# Patient Record
Sex: Male | Born: 1935
Health system: Southern US, Community
[De-identification: ages and names within clinical notes are randomized; demographics above are authoritative.]

## PROBLEM LIST (undated history)

## (undated) DIAGNOSIS — E785 Hyperlipidemia, unspecified: Secondary | ICD-10-CM

## (undated) DIAGNOSIS — S02109A Fracture of base of skull, unspecified side, initial encounter for closed fracture: Secondary | ICD-10-CM

## (undated) DIAGNOSIS — I10 Essential (primary) hypertension: Secondary | ICD-10-CM

## (undated) DIAGNOSIS — Z8546 Personal history of malignant neoplasm of prostate: Secondary | ICD-10-CM

## (undated) DIAGNOSIS — N189 Chronic kidney disease, unspecified: Secondary | ICD-10-CM

## (undated) DIAGNOSIS — S06309A Unspecified focal traumatic brain injury with loss of consciousness of unspecified duration, initial encounter: Secondary | ICD-10-CM

## (undated) DIAGNOSIS — C801 Malignant (primary) neoplasm, unspecified: Secondary | ICD-10-CM

## (undated) DIAGNOSIS — E8881 Metabolic syndrome: Secondary | ICD-10-CM

## (undated) DIAGNOSIS — I2510 Atherosclerotic heart disease of native coronary artery without angina pectoris: Secondary | ICD-10-CM

## (undated) DIAGNOSIS — I739 Peripheral vascular disease, unspecified: Secondary | ICD-10-CM

## (undated) DIAGNOSIS — I5030 Unspecified diastolic (congestive) heart failure: Secondary | ICD-10-CM

## (undated) HISTORY — DX: Metabolic syndrome: E88.810

## (undated) HISTORY — DX: Personal history of malignant neoplasm of prostate: Z85.46

## (undated) HISTORY — DX: Unspecified focal traumatic brain injury with loss of consciousness of unspecified duration, initial encounter: S06.309A

## (undated) HISTORY — DX: Metabolic syndrome: E88.81

## (undated) HISTORY — DX: Fracture of base of skull, unspecified side, initial encounter for closed fracture: S02.109A

## (undated) HISTORY — DX: Chronic kidney disease, unspecified: N18.9

## (undated) HISTORY — DX: Atherosclerotic heart disease of native coronary artery without angina pectoris: I25.10

## (undated) HISTORY — PX: TRANSPERINEAL IMPLANT OF RADIATION SEEDS W/ ULTRASOUND: SUR1381

## (undated) HISTORY — DX: Unspecified diastolic (congestive) heart failure: I50.30

## (undated) HISTORY — DX: Hyperlipidemia, unspecified: E78.5

## (undated) HISTORY — DX: Peripheral vascular disease, unspecified: I73.9

## (undated) HISTORY — PX: BRAIN SURGERY: SHX531

---

## 1998-01-20 ENCOUNTER — Ambulatory Visit (HOSPITAL_COMMUNITY): Admission: RE | Admit: 1998-01-20 | Discharge: 1998-01-20 | Payer: Self-pay | Admitting: Family Medicine

## 1998-07-08 ENCOUNTER — Emergency Department (HOSPITAL_COMMUNITY): Admission: EM | Admit: 1998-07-08 | Discharge: 1998-07-08 | Payer: Self-pay | Admitting: *Deleted

## 1999-09-17 ENCOUNTER — Other Ambulatory Visit: Admission: RE | Admit: 1999-09-17 | Discharge: 1999-09-17 | Payer: Self-pay | Admitting: Urology

## 1999-09-17 ENCOUNTER — Encounter (INDEPENDENT_AMBULATORY_CARE_PROVIDER_SITE_OTHER): Payer: Self-pay | Admitting: Specialist

## 1999-10-22 ENCOUNTER — Encounter: Admission: RE | Admit: 1999-10-22 | Discharge: 2000-01-20 | Payer: Self-pay | Admitting: Urology

## 1999-12-28 ENCOUNTER — Encounter: Admission: RE | Admit: 1999-12-28 | Discharge: 1999-12-28 | Payer: Self-pay | Admitting: Family Medicine

## 1999-12-28 ENCOUNTER — Encounter: Payer: Self-pay | Admitting: Family Medicine

## 2000-01-11 ENCOUNTER — Encounter: Payer: Self-pay | Admitting: Urology

## 2000-01-11 ENCOUNTER — Ambulatory Visit (HOSPITAL_BASED_OUTPATIENT_CLINIC_OR_DEPARTMENT_OTHER): Admission: RE | Admit: 2000-01-11 | Discharge: 2000-01-11 | Payer: Self-pay | Admitting: Urology

## 2000-02-04 ENCOUNTER — Encounter: Admission: RE | Admit: 2000-02-04 | Discharge: 2000-05-04 | Payer: Self-pay | Admitting: Radiation Oncology

## 2000-10-06 ENCOUNTER — Encounter: Payer: Self-pay | Admitting: Family Medicine

## 2000-10-06 ENCOUNTER — Encounter: Admission: RE | Admit: 2000-10-06 | Discharge: 2000-10-06 | Payer: Self-pay | Admitting: Family Medicine

## 2002-02-16 ENCOUNTER — Encounter: Admission: RE | Admit: 2002-02-16 | Discharge: 2002-05-17 | Payer: Self-pay

## 2002-11-02 ENCOUNTER — Encounter: Admission: RE | Admit: 2002-11-02 | Discharge: 2002-12-08 | Payer: Self-pay

## 2003-07-05 ENCOUNTER — Encounter: Admission: RE | Admit: 2003-07-05 | Discharge: 2003-07-05 | Payer: Self-pay | Admitting: Family Medicine

## 2004-03-01 ENCOUNTER — Inpatient Hospital Stay (HOSPITAL_COMMUNITY): Admission: EM | Admit: 2004-03-01 | Discharge: 2004-03-03 | Payer: Self-pay | Admitting: Emergency Medicine

## 2004-03-01 ENCOUNTER — Ambulatory Visit: Payer: Self-pay | Admitting: Cardiology

## 2004-03-07 ENCOUNTER — Ambulatory Visit (HOSPITAL_COMMUNITY): Admission: RE | Admit: 2004-03-07 | Discharge: 2004-03-08 | Payer: Self-pay | Admitting: Cardiology

## 2004-03-07 ENCOUNTER — Ambulatory Visit: Payer: Self-pay | Admitting: Cardiology

## 2004-03-15 ENCOUNTER — Ambulatory Visit: Payer: Self-pay | Admitting: Internal Medicine

## 2004-03-19 ENCOUNTER — Ambulatory Visit: Payer: Self-pay | Admitting: Internal Medicine

## 2004-03-22 ENCOUNTER — Ambulatory Visit: Payer: Self-pay | Admitting: Internal Medicine

## 2004-08-16 ENCOUNTER — Ambulatory Visit: Payer: Self-pay | Admitting: Internal Medicine

## 2004-10-11 ENCOUNTER — Ambulatory Visit: Payer: Self-pay

## 2005-02-26 ENCOUNTER — Ambulatory Visit (HOSPITAL_COMMUNITY): Admission: RE | Admit: 2005-02-26 | Discharge: 2005-02-26 | Payer: Self-pay | Admitting: Otolaryngology

## 2005-02-27 ENCOUNTER — Inpatient Hospital Stay (HOSPITAL_COMMUNITY): Admission: RE | Admit: 2005-02-27 | Discharge: 2005-03-01 | Payer: Self-pay | Admitting: Otolaryngology

## 2005-02-27 ENCOUNTER — Ambulatory Visit: Payer: Self-pay | Admitting: Cardiovascular Disease

## 2005-03-04 ENCOUNTER — Ambulatory Visit: Payer: Self-pay | Admitting: Oncology

## 2005-03-22 ENCOUNTER — Ambulatory Visit: Payer: Self-pay | Admitting: Internal Medicine

## 2005-03-23 ENCOUNTER — Emergency Department (HOSPITAL_COMMUNITY): Admission: EM | Admit: 2005-03-23 | Discharge: 2005-03-23 | Payer: Self-pay | Admitting: Emergency Medicine

## 2005-09-10 ENCOUNTER — Ambulatory Visit: Payer: Self-pay | Admitting: Internal Medicine

## 2005-09-12 ENCOUNTER — Ambulatory Visit: Payer: Self-pay | Admitting: Internal Medicine

## 2006-01-22 ENCOUNTER — Ambulatory Visit: Payer: Self-pay | Admitting: Cardiology

## 2006-01-22 LAB — CONVERTED CEMR LAB
ALT: 16 units/L (ref 0–40)
AST: 18 units/L (ref 0–37)
BUN: 18 mg/dL (ref 6–23)
Calcium: 9.7 mg/dL (ref 8.4–10.5)
Chloride: 109 meq/L (ref 96–112)
Creatinine, Ser: 1.5 mg/dL (ref 0.4–1.5)
GFR calc Af Amer: 59 mL/min
GFR calc non Af Amer: 49 mL/min
HCT: 40.4 % (ref 39.0–52.0)
Hemoglobin: 13.3 g/dL (ref 13.0–17.0)
MCHC: 32.9 g/dL (ref 30.0–36.0)
MCV: 91.4 fL (ref 78.0–100.0)
Potassium: 4.2 meq/L (ref 3.5–5.1)
RDW: 13.9 % (ref 11.5–14.6)
WBC: 7.1 10*3/uL (ref 4.5–10.5)

## 2006-02-03 ENCOUNTER — Ambulatory Visit: Payer: Self-pay

## 2006-02-12 ENCOUNTER — Ambulatory Visit: Payer: Self-pay | Admitting: Internal Medicine

## 2006-02-18 ENCOUNTER — Ambulatory Visit: Payer: Self-pay | Admitting: Internal Medicine

## 2006-02-18 LAB — CONVERTED CEMR LAB
AST: 16 units/L (ref 0–37)
Albumin: 3.6 g/dL (ref 3.5–5.2)
CO2: 27 meq/L (ref 19–32)
Chloride: 108 meq/L (ref 96–112)
Cholesterol: 126 mg/dL (ref 0–200)
Creatinine, Ser: 1.4 mg/dL (ref 0.4–1.5)
HDL: 30.9 mg/dL — ABNORMAL LOW (ref >39.0)
Potassium: 4.6 meq/L (ref 3.5–5.1)
Sodium: 145 meq/L (ref 135–145)
Total Bilirubin: 0.7 mg/dL (ref 0.3–1.2)
Total Protein: 6.6 g/dL (ref 6.0–8.3)
Triglycerides: 136 mg/dL (ref 0–149)
VLDL: 27 mg/dL (ref 0–40)

## 2006-02-28 ENCOUNTER — Ambulatory Visit: Payer: Self-pay | Admitting: Cardiovascular Disease

## 2006-04-30 ENCOUNTER — Ambulatory Visit: Payer: Self-pay

## 2006-04-30 ENCOUNTER — Ambulatory Visit: Payer: Self-pay | Admitting: Cardiovascular Disease

## 2006-04-30 LAB — CONVERTED CEMR LAB
BUN: 24 mg/dL — ABNORMAL HIGH (ref 6–23)
Basophils Absolute: 0.1 10*3/uL (ref 0.0–0.1)
Calcium: 9.6 mg/dL (ref 8.4–10.5)
GFR calc Af Amer: 59 mL/min
GFR calc non Af Amer: 49 mL/min
Hemoglobin: 11.1 g/dL — ABNORMAL LOW (ref 13.0–17.0)
INR: 1 (ref 0.9–2.0)
Lymphocytes Relative: 24.2 % (ref 12.0–46.0)
MCHC: 33.7 g/dL (ref 30.0–36.0)
Monocytes Absolute: 0.4 10*3/uL (ref 0.2–0.7)
Monocytes Relative: 5.5 % (ref 3.0–11.0)
Neutro Abs: 4.6 10*3/uL (ref 1.4–7.7)
Platelets: 72 10*3/uL — ABNORMAL LOW (ref 150–400)
Potassium: 4.8 meq/L (ref 3.5–5.1)
Prothrombin Time: 11.8 s (ref 10.0–14.0)
RDW: 14.5 % (ref 11.5–14.6)

## 2006-05-09 ENCOUNTER — Ambulatory Visit: Payer: Self-pay | Admitting: Cardiovascular Disease

## 2006-05-09 ENCOUNTER — Ambulatory Visit (HOSPITAL_COMMUNITY): Admission: RE | Admit: 2006-05-09 | Discharge: 2006-05-09 | Payer: Self-pay | Admitting: Cardiovascular Disease

## 2006-05-21 ENCOUNTER — Ambulatory Visit: Payer: Self-pay | Admitting: Vascular Surgery

## 2006-05-26 ENCOUNTER — Ambulatory Visit: Payer: Self-pay | Admitting: Cardiovascular Disease

## 2006-08-26 ENCOUNTER — Ambulatory Visit: Payer: Self-pay | Admitting: Vascular Surgery

## 2006-12-01 ENCOUNTER — Ambulatory Visit: Payer: Self-pay | Admitting: Cardiovascular Disease

## 2007-03-03 ENCOUNTER — Ambulatory Visit: Payer: Self-pay | Admitting: Vascular Surgery

## 2007-03-18 ENCOUNTER — Ambulatory Visit: Payer: Self-pay | Admitting: Gastroenterology

## 2007-05-27 ENCOUNTER — Ambulatory Visit: Payer: Self-pay | Admitting: Cardiovascular Disease

## 2007-05-27 LAB — CONVERTED CEMR LAB
CO2: 27 meq/L (ref 19–32)
Chloride: 111 meq/L (ref 96–112)
GFR calc Af Amer: 59 mL/min
Glucose, Bld: 231 mg/dL — ABNORMAL HIGH (ref 70–99)
INR: 1.1 — ABNORMAL HIGH (ref 0.8–1.0)
Lymphocytes Relative: 26.4 % (ref 12.0–46.0)
Monocytes Absolute: 0.6 10*3/uL (ref 0.1–1.0)
Monocytes Relative: 8 % (ref 3.0–12.0)
Neutrophils Relative %: 63.6 % (ref 43.0–77.0)
Platelets: 84 10*3/uL — ABNORMAL LOW (ref 150–400)
Potassium: 4.7 meq/L (ref 3.5–5.1)
Prothrombin Time: 13.1 s (ref 10.9–13.3)
RDW: 14.6 % (ref 11.5–14.6)
Sodium: 143 meq/L (ref 135–145)
WBC: 7.5 10*3/uL (ref 4.5–10.5)

## 2007-05-28 ENCOUNTER — Ambulatory Visit: Payer: Self-pay | Admitting: Cardiovascular Disease

## 2007-05-28 ENCOUNTER — Inpatient Hospital Stay (HOSPITAL_BASED_OUTPATIENT_CLINIC_OR_DEPARTMENT_OTHER): Admission: RE | Admit: 2007-05-28 | Discharge: 2007-05-28 | Payer: Self-pay | Admitting: Cardiovascular Disease

## 2007-08-25 ENCOUNTER — Ambulatory Visit: Payer: Self-pay | Admitting: Vascular Surgery

## 2007-09-11 ENCOUNTER — Ambulatory Visit: Payer: Self-pay | Admitting: Cardiovascular Disease

## 2007-09-16 ENCOUNTER — Ambulatory Visit: Payer: Self-pay | Admitting: Cardiovascular Disease

## 2007-09-16 LAB — CONVERTED CEMR LAB
AST: 14 units/L (ref 0–37)
Albumin: 3.8 g/dL (ref 3.5–5.2)
HDL: 33 mg/dL — ABNORMAL LOW (ref >39.0)
Total CHOL/HDL Ratio: 3.8
Triglycerides: 167 mg/dL — ABNORMAL HIGH (ref 0–149)
VLDL: 33 mg/dL (ref 0–40)

## 2007-09-24 ENCOUNTER — Ambulatory Visit (HOSPITAL_COMMUNITY): Admission: RE | Admit: 2007-09-24 | Discharge: 2007-09-24 | Payer: Self-pay | Admitting: Urology

## 2007-10-19 ENCOUNTER — Ambulatory Visit (HOSPITAL_BASED_OUTPATIENT_CLINIC_OR_DEPARTMENT_OTHER): Admission: RE | Admit: 2007-10-19 | Discharge: 2007-10-19 | Payer: Self-pay | Admitting: Urology

## 2008-01-08 DIAGNOSIS — S02109A Fracture of base of skull, unspecified side, initial encounter for closed fracture: Secondary | ICD-10-CM

## 2008-01-08 HISTORY — DX: Fracture of base of skull, unspecified side, initial encounter for closed fracture: S02.109A

## 2008-03-13 DIAGNOSIS — I2510 Atherosclerotic heart disease of native coronary artery without angina pectoris: Secondary | ICD-10-CM | POA: Insufficient documentation

## 2008-03-13 DIAGNOSIS — I70219 Atherosclerosis of native arteries of extremities with intermittent claudication, unspecified extremity: Secondary | ICD-10-CM

## 2008-03-13 DIAGNOSIS — I129 Hypertensive chronic kidney disease with stage 1 through stage 4 chronic kidney disease, or unspecified chronic kidney disease: Secondary | ICD-10-CM

## 2008-03-13 DIAGNOSIS — E785 Hyperlipidemia, unspecified: Secondary | ICD-10-CM

## 2008-03-14 ENCOUNTER — Encounter: Payer: Self-pay | Admitting: Cardiovascular Disease

## 2008-03-14 ENCOUNTER — Ambulatory Visit: Payer: Self-pay | Admitting: Cardiovascular Disease

## 2008-03-23 ENCOUNTER — Encounter (INDEPENDENT_AMBULATORY_CARE_PROVIDER_SITE_OTHER): Payer: Self-pay | Admitting: *Deleted

## 2008-03-23 ENCOUNTER — Encounter: Payer: Self-pay | Admitting: Internal Medicine

## 2008-03-23 ENCOUNTER — Ambulatory Visit: Payer: Self-pay | Admitting: Internal Medicine

## 2008-03-23 DIAGNOSIS — E8881 Metabolic syndrome: Secondary | ICD-10-CM

## 2008-03-23 DIAGNOSIS — E1151 Type 2 diabetes mellitus with diabetic peripheral angiopathy without gangrene: Secondary | ICD-10-CM

## 2008-03-23 DIAGNOSIS — M109 Gout, unspecified: Secondary | ICD-10-CM

## 2008-03-23 DIAGNOSIS — Z8546 Personal history of malignant neoplasm of prostate: Secondary | ICD-10-CM

## 2008-03-23 DIAGNOSIS — E1165 Type 2 diabetes mellitus with hyperglycemia: Secondary | ICD-10-CM

## 2008-03-23 LAB — CONVERTED CEMR LAB
AST: 27 units/L (ref 0–37)
Alkaline Phosphatase: 69 units/L (ref 39–117)
Basophils Relative: 0.4 % (ref 0.0–3.0)
CO2: 22 meq/L (ref 19–32)
Chloride: 115 meq/L — ABNORMAL HIGH (ref 96–112)
Cholesterol, target level: 200 mg/dL
Cholesterol: 152 mg/dL (ref 0–200)
Creatinine, Ser: 1.8 mg/dL — ABNORMAL HIGH (ref 0.4–1.5)
Direct LDL: 81.9 mg/dL
Hemoglobin: 12.8 g/dL — ABNORMAL LOW (ref 13.0–17.0)
Iron: 92 ug/dL (ref 42–165)
Ketones, ur: NEGATIVE mg/dL
Leukocytes, UA: NEGATIVE
MCHC: 33.6 g/dL (ref 30.0–36.0)
MCV: 92.3 fL (ref 78.0–100.0)
Magnesium: 2 mg/dL (ref 1.5–2.5)
Microalb Creat Ratio: 12.4 mg/g (ref 0.0–30.0)
RBC: 4.14 M/uL — ABNORMAL LOW (ref 4.22–5.81)
Sodium: 143 meq/L (ref 135–145)
Specific Gravity, Urine: 1.02 (ref 1.000–1.030)
Total Bilirubin: 1.3 mg/dL — ABNORMAL HIGH (ref 0.3–1.2)
Total CHOL/HDL Ratio: 5
Transferrin: 192.1 mg/dL — ABNORMAL LOW (ref 212.0–360.0)
Uric Acid, Serum: 4.4 mg/dL (ref 4.0–7.8)
Urine Glucose: NEGATIVE mg/dL
pH: 5.5 (ref 5.0–8.0)

## 2008-03-25 ENCOUNTER — Ambulatory Visit: Payer: Self-pay | Admitting: Cardiology

## 2008-03-25 ENCOUNTER — Inpatient Hospital Stay (HOSPITAL_COMMUNITY): Admission: EM | Admit: 2008-03-25 | Discharge: 2008-04-01 | Payer: Self-pay | Admitting: Emergency Medicine

## 2008-03-29 ENCOUNTER — Ambulatory Visit: Payer: Self-pay | Admitting: Physical Medicine & Rehabilitation

## 2008-03-31 ENCOUNTER — Encounter: Payer: Self-pay | Admitting: Cardiovascular Disease

## 2008-04-01 ENCOUNTER — Inpatient Hospital Stay (HOSPITAL_COMMUNITY)
Admission: RE | Admit: 2008-04-01 | Discharge: 2008-04-08 | Payer: Self-pay | Admitting: Physical Medicine & Rehabilitation

## 2008-04-08 ENCOUNTER — Inpatient Hospital Stay (HOSPITAL_COMMUNITY): Admission: EM | Admit: 2008-04-08 | Discharge: 2008-04-13 | Payer: Self-pay | Admitting: Neurological Surgery

## 2008-04-13 ENCOUNTER — Inpatient Hospital Stay (HOSPITAL_COMMUNITY)
Admission: RE | Admit: 2008-04-13 | Discharge: 2008-04-22 | Payer: Self-pay | Admitting: Physical Medicine & Rehabilitation

## 2008-04-16 ENCOUNTER — Ambulatory Visit: Payer: Self-pay | Admitting: Physical Medicine & Rehabilitation

## 2008-04-27 ENCOUNTER — Emergency Department (HOSPITAL_COMMUNITY): Admission: EM | Admit: 2008-04-27 | Discharge: 2008-04-27 | Payer: Self-pay | Admitting: Emergency Medicine

## 2008-04-29 ENCOUNTER — Ambulatory Visit: Payer: Self-pay | Admitting: Internal Medicine

## 2008-04-29 DIAGNOSIS — D518 Other vitamin B12 deficiency anemias: Secondary | ICD-10-CM

## 2008-04-29 LAB — CONVERTED CEMR LAB
Bilirubin Urine: NEGATIVE
Nitrite: NEGATIVE
Urobilinogen, UA: 0.2 (ref 0.0–1.0)
pH: 5.5 (ref 5.0–8.0)

## 2008-04-30 ENCOUNTER — Encounter: Payer: Self-pay | Admitting: Internal Medicine

## 2008-05-13 ENCOUNTER — Ambulatory Visit: Payer: Self-pay | Admitting: Internal Medicine

## 2008-05-23 ENCOUNTER — Encounter
Admission: RE | Admit: 2008-05-23 | Discharge: 2008-05-25 | Payer: Self-pay | Admitting: Physical Medicine & Rehabilitation

## 2008-05-25 ENCOUNTER — Ambulatory Visit: Payer: Self-pay | Admitting: Physical Medicine & Rehabilitation

## 2008-05-30 ENCOUNTER — Ambulatory Visit (HOSPITAL_COMMUNITY): Admission: RE | Admit: 2008-05-30 | Discharge: 2008-05-30 | Payer: Self-pay | Admitting: Neurological Surgery

## 2008-06-20 ENCOUNTER — Telehealth (INDEPENDENT_AMBULATORY_CARE_PROVIDER_SITE_OTHER): Payer: Self-pay | Admitting: *Deleted

## 2008-06-21 ENCOUNTER — Telehealth: Payer: Self-pay | Admitting: Internal Medicine

## 2008-07-05 ENCOUNTER — Ambulatory Visit: Payer: Self-pay | Admitting: Internal Medicine

## 2008-07-05 LAB — CONVERTED CEMR LAB
ALT: 10 units/L (ref 0–53)
AST: 12 units/L (ref 0–37)
Albumin: 3.7 g/dL (ref 3.5–5.2)
Alkaline Phosphatase: 74 units/L (ref 39–117)
Basophils Relative: 0.1 % (ref 0.0–3.0)
Bilirubin Urine: NEGATIVE
Eosinophils Relative: 1.5 % (ref 0.0–5.0)
GFR calc non Af Amer: 58.59 mL/min (ref >60)
Glucose, Bld: 125 mg/dL — ABNORMAL HIGH (ref 70–99)
HCT: 35.2 % — ABNORMAL LOW (ref 39.0–52.0)
Hemoglobin: 12.1 g/dL — ABNORMAL LOW (ref 13.0–17.0)
Ketones, ur: NEGATIVE mg/dL
LDL Cholesterol: 84 mg/dL (ref 0–99)
Leukocytes, UA: NEGATIVE
Lymphocytes Relative: 28.6 % (ref 12.0–46.0)
Lymphs Abs: 2.1 10*3/uL (ref 0.7–4.0)
Monocytes Relative: 7.9 % (ref 3.0–12.0)
Neutro Abs: 4.4 10*3/uL (ref 1.4–7.7)
Nitrite: NEGATIVE
Potassium: 4.1 meq/L (ref 3.5–5.1)
RBC: 3.97 M/uL — ABNORMAL LOW (ref 4.22–5.81)
Sodium: 145 meq/L (ref 135–145)
Specific Gravity, Urine: 1.015 (ref 1.000–1.030)
TSH: 1.19 microintl units/mL (ref 0.35–5.50)
Total CHOL/HDL Ratio: 4
Total Protein, Urine: NEGATIVE mg/dL
Uric Acid, Serum: 4.8 mg/dL (ref 4.0–7.8)
VLDL: 25.8 mg/dL (ref 0.0–40.0)
WBC: 7.2 10*3/uL (ref 4.5–10.5)
pH: 5 (ref 5.0–8.0)

## 2008-07-06 ENCOUNTER — Encounter: Payer: Self-pay | Admitting: Internal Medicine

## 2008-08-09 ENCOUNTER — Encounter
Admission: RE | Admit: 2008-08-09 | Discharge: 2008-11-07 | Payer: Self-pay | Admitting: Physical Medicine & Rehabilitation

## 2008-08-10 ENCOUNTER — Encounter (INDEPENDENT_AMBULATORY_CARE_PROVIDER_SITE_OTHER): Payer: Self-pay | Admitting: *Deleted

## 2008-08-17 ENCOUNTER — Ambulatory Visit: Payer: Self-pay | Admitting: Physical Medicine & Rehabilitation

## 2008-09-01 ENCOUNTER — Ambulatory Visit: Payer: Self-pay | Admitting: Internal Medicine

## 2008-09-01 LAB — CONVERTED CEMR LAB: Blood Glucose, Fingerstick: 160

## 2008-10-03 ENCOUNTER — Ambulatory Visit: Payer: Self-pay | Admitting: Cardiovascular Disease

## 2008-10-17 ENCOUNTER — Ambulatory Visit: Payer: Self-pay | Admitting: Internal Medicine

## 2008-10-17 LAB — CONVERTED CEMR LAB
BUN: 25 mg/dL — ABNORMAL HIGH (ref 6–23)
Basophils Relative: 0.5 % (ref 0.0–3.0)
Bilirubin Urine: NEGATIVE
Chloride: 113 meq/L — ABNORMAL HIGH (ref 96–112)
Creatinine, Ser: 1.8 mg/dL — ABNORMAL HIGH (ref 0.4–1.5)
Eosinophils Relative: 1.6 % (ref 0.0–5.0)
GFR calc non Af Amer: 47.44 mL/min (ref >60)
Hgb A1c MFr Bld: 9 % — ABNORMAL HIGH (ref 4.6–6.5)
Leukocytes, UA: NEGATIVE
Lymphocytes Relative: 26.8 % (ref 12.0–46.0)
MCV: 92 fL (ref 78.0–100.0)
Monocytes Relative: 6 % (ref 3.0–12.0)
Neutrophils Relative %: 65.1 % (ref 43.0–77.0)
Nitrite: NEGATIVE
Platelets: 95 10*3/uL — ABNORMAL LOW (ref 150.0–400.0)
RBC: 4.22 M/uL (ref 4.22–5.81)
Specific Gravity, Urine: 1.01 (ref 1.000–1.030)
Total Protein, Urine: NEGATIVE mg/dL
WBC: 7.6 10*3/uL (ref 4.5–10.5)
pH: 6 (ref 5.0–8.0)

## 2008-10-18 ENCOUNTER — Ambulatory Visit: Payer: Self-pay | Admitting: Physical Medicine & Rehabilitation

## 2008-10-20 ENCOUNTER — Encounter: Payer: Self-pay | Admitting: Internal Medicine

## 2008-11-16 ENCOUNTER — Ambulatory Visit: Payer: Self-pay | Admitting: Internal Medicine

## 2008-11-23 ENCOUNTER — Telehealth: Payer: Self-pay | Admitting: Internal Medicine

## 2008-11-23 ENCOUNTER — Ambulatory Visit: Payer: Self-pay | Admitting: Internal Medicine

## 2008-11-23 LAB — CONVERTED CEMR LAB
Glucose, Urine, Semiquant: NEGATIVE
Nitrite: NEGATIVE
Specific Gravity, Urine: 1.01

## 2008-12-12 ENCOUNTER — Encounter
Admission: RE | Admit: 2008-12-12 | Discharge: 2008-12-14 | Payer: Self-pay | Admitting: Physical Medicine & Rehabilitation

## 2008-12-13 ENCOUNTER — Ambulatory Visit: Payer: Self-pay | Admitting: Physical Medicine & Rehabilitation

## 2008-12-14 ENCOUNTER — Ambulatory Visit: Payer: Self-pay | Admitting: Internal Medicine

## 2009-01-11 ENCOUNTER — Ambulatory Visit (HOSPITAL_COMMUNITY)
Admission: RE | Admit: 2009-01-11 | Discharge: 2009-01-11 | Payer: Self-pay | Admitting: Physical Medicine & Rehabilitation

## 2009-01-26 ENCOUNTER — Ambulatory Visit: Payer: Self-pay | Admitting: Internal Medicine

## 2009-01-26 DIAGNOSIS — N181 Chronic kidney disease, stage 1: Secondary | ICD-10-CM | POA: Insufficient documentation

## 2009-01-26 LAB — CONVERTED CEMR LAB
BUN: 24 mg/dL — ABNORMAL HIGH (ref 6–23)
Basophils Absolute: 0 10*3/uL (ref 0.0–0.1)
Bilirubin, Direct: 0.1 mg/dL (ref 0.0–0.3)
Creatinine, Ser: 1.9 mg/dL — ABNORMAL HIGH (ref 0.4–1.5)
GFR calc non Af Amer: 44.54 mL/min (ref >60)
Glucose, Bld: 136 mg/dL — ABNORMAL HIGH (ref 70–99)
HCT: 40.9 % (ref 39.0–52.0)
Hgb A1c MFr Bld: 9.9 % — ABNORMAL HIGH (ref 4.6–6.5)
Leukocytes, UA: NEGATIVE
Lymphs Abs: 2.1 10*3/uL (ref 0.7–4.0)
MCV: 94.6 fL (ref 78.0–100.0)
Monocytes Absolute: 0.7 10*3/uL (ref 0.1–1.0)
Neutrophils Relative %: 60.1 % (ref 43.0–77.0)
Nitrite: NEGATIVE
Platelets: 86 10*3/uL — ABNORMAL LOW (ref 150.0–400.0)
Potassium: 4.2 meq/L (ref 3.5–5.1)
RDW: 14 % (ref 11.5–14.6)
Specific Gravity, Urine: 1.015 (ref 1.000–1.030)
Total Bilirubin: 0.7 mg/dL (ref 0.3–1.2)
Uric Acid, Serum: 4.8 mg/dL (ref 4.0–7.8)
pH: 5.5 (ref 5.0–8.0)

## 2009-01-31 ENCOUNTER — Encounter: Admission: RE | Admit: 2009-01-31 | Discharge: 2009-01-31 | Payer: Self-pay | Admitting: Internal Medicine

## 2009-01-31 ENCOUNTER — Telehealth: Payer: Self-pay | Admitting: Internal Medicine

## 2009-02-03 ENCOUNTER — Ambulatory Visit (HOSPITAL_COMMUNITY): Admission: RE | Admit: 2009-02-03 | Discharge: 2009-02-03 | Payer: Self-pay | Admitting: Internal Medicine

## 2009-02-28 ENCOUNTER — Ambulatory Visit: Payer: Self-pay | Admitting: Internal Medicine

## 2009-03-14 ENCOUNTER — Encounter: Payer: Self-pay | Admitting: Internal Medicine

## 2009-03-30 ENCOUNTER — Telehealth: Payer: Self-pay | Admitting: Internal Medicine

## 2009-03-30 ENCOUNTER — Ambulatory Visit: Payer: Self-pay | Admitting: Internal Medicine

## 2009-04-05 ENCOUNTER — Encounter: Payer: Self-pay | Admitting: Internal Medicine

## 2009-04-18 ENCOUNTER — Ambulatory Visit: Payer: Self-pay | Admitting: Cardiovascular Disease

## 2009-04-18 LAB — CONVERTED CEMR LAB
Calcium: 9.1 mg/dL (ref 8.4–10.5)
GFR calc non Af Amer: 44.51 mL/min (ref >60)
Glucose, Bld: 261 mg/dL — ABNORMAL HIGH (ref 70–99)
Sodium: 145 meq/L (ref 135–145)

## 2009-05-02 ENCOUNTER — Ambulatory Visit: Payer: Self-pay | Admitting: Cardiovascular Disease

## 2009-05-02 ENCOUNTER — Ambulatory Visit: Payer: Self-pay | Admitting: Internal Medicine

## 2009-05-10 LAB — CONVERTED CEMR LAB
Chloride: 116 meq/L — ABNORMAL HIGH (ref 96–112)
Creatinine, Ser: 1.9 mg/dL — ABNORMAL HIGH (ref 0.4–1.5)
Potassium: 4.3 meq/L (ref 3.5–5.1)
Sodium: 146 meq/L — ABNORMAL HIGH (ref 135–145)

## 2009-06-01 ENCOUNTER — Ambulatory Visit: Payer: Self-pay | Admitting: Internal Medicine

## 2009-06-29 ENCOUNTER — Ambulatory Visit: Payer: Self-pay | Admitting: Internal Medicine

## 2009-06-29 LAB — CONVERTED CEMR LAB
ALT: 16 units/L (ref 0–53)
AST: 16 units/L (ref 0–37)
Albumin: 4 g/dL (ref 3.5–5.2)
Alkaline Phosphatase: 88 units/L (ref 39–117)
Basophils Relative: 0.3 % (ref 0.0–3.0)
CO2: 27 meq/L (ref 19–32)
Calcium: 9.5 mg/dL (ref 8.4–10.5)
Eosinophils Absolute: 0.1 10*3/uL (ref 0.0–0.7)
Hemoglobin: 13.3 g/dL (ref 13.0–17.0)
Lymphocytes Relative: 22.4 % (ref 12.0–46.0)
MCHC: 33.9 g/dL (ref 30.0–36.0)
Monocytes Relative: 7.5 % (ref 3.0–12.0)
Neutro Abs: 5.6 10*3/uL (ref 1.4–7.7)
RBC: 4.28 M/uL (ref 4.22–5.81)
Sodium: 145 meq/L (ref 135–145)
Total Protein: 7.2 g/dL (ref 6.0–8.3)

## 2009-06-30 ENCOUNTER — Telehealth: Payer: Self-pay | Admitting: Internal Medicine

## 2009-06-30 ENCOUNTER — Encounter: Payer: Self-pay | Admitting: Internal Medicine

## 2009-07-21 ENCOUNTER — Encounter: Payer: Self-pay | Admitting: Gastroenterology

## 2009-07-24 ENCOUNTER — Encounter: Payer: Self-pay | Admitting: Internal Medicine

## 2009-08-07 ENCOUNTER — Ambulatory Visit: Payer: Self-pay | Admitting: Internal Medicine

## 2009-09-05 ENCOUNTER — Telehealth (INDEPENDENT_AMBULATORY_CARE_PROVIDER_SITE_OTHER): Payer: Self-pay | Admitting: *Deleted

## 2009-09-21 ENCOUNTER — Ambulatory Visit: Payer: Self-pay | Admitting: Internal Medicine

## 2009-10-23 ENCOUNTER — Ambulatory Visit: Payer: Self-pay | Admitting: Cardiovascular Disease

## 2009-10-24 ENCOUNTER — Ambulatory Visit: Payer: Self-pay | Admitting: Cardiovascular Disease

## 2009-10-26 LAB — CONVERTED CEMR LAB
LDL Cholesterol: 71 mg/dL (ref 0–99)
Total CHOL/HDL Ratio: 5

## 2009-10-30 ENCOUNTER — Ambulatory Visit: Payer: Self-pay | Admitting: Internal Medicine

## 2009-10-30 DIAGNOSIS — R1319 Other dysphagia: Secondary | ICD-10-CM

## 2009-10-30 LAB — CONVERTED CEMR LAB
ALT: 19 units/L (ref 0–53)
AST: 16 units/L (ref 0–37)
Basophils Relative: 0.4 % (ref 0.0–3.0)
Bilirubin Urine: NEGATIVE
Bilirubin, Direct: 0.1 mg/dL (ref 0.0–0.3)
Chloride: 108 meq/L (ref 96–112)
Eosinophils Relative: 1.8 % (ref 0.0–5.0)
Folate: 5.1 ng/mL
HCT: 39 % (ref 39.0–52.0)
Hgb A1c MFr Bld: 10.3 % — ABNORMAL HIGH (ref 4.6–6.5)
Ketones, ur: NEGATIVE mg/dL
Leukocytes, UA: NEGATIVE
Lymphs Abs: 1.9 10*3/uL (ref 0.7–4.0)
MCV: 92.1 fL (ref 78.0–100.0)
Monocytes Absolute: 0.7 10*3/uL (ref 0.1–1.0)
Monocytes Relative: 8.7 % (ref 3.0–12.0)
Neutrophils Relative %: 65.2 % (ref 43.0–77.0)
Potassium: 4.5 meq/L (ref 3.5–5.1)
RBC: 4.23 M/uL (ref 4.22–5.81)
Total Protein: 6.7 g/dL (ref 6.0–8.3)
Urine Glucose: >=1000 mg/dL
Vitamin B-12: >1500 pg/mL — ABNORMAL HIGH (ref 211–911)
WBC: 8.1 10*3/uL (ref 4.5–10.5)
pH: 6 (ref 5.0–8.0)

## 2009-10-30 LAB — HM DIABETES EYE EXAM

## 2009-11-28 ENCOUNTER — Ambulatory Visit: Payer: Self-pay | Admitting: Internal Medicine

## 2009-12-14 ENCOUNTER — Ambulatory Visit: Payer: Self-pay | Admitting: Gastroenterology

## 2009-12-18 ENCOUNTER — Ambulatory Visit (HOSPITAL_COMMUNITY)
Admission: RE | Admit: 2009-12-18 | Discharge: 2009-12-18 | Payer: Self-pay | Source: Home / Self Care | Attending: Gastroenterology | Admitting: Gastroenterology

## 2009-12-19 ENCOUNTER — Encounter: Payer: Self-pay | Admitting: Gastroenterology

## 2009-12-19 ENCOUNTER — Telehealth (INDEPENDENT_AMBULATORY_CARE_PROVIDER_SITE_OTHER): Payer: Self-pay

## 2010-01-02 ENCOUNTER — Ambulatory Visit: Payer: Self-pay | Admitting: Internal Medicine

## 2010-01-15 ENCOUNTER — Encounter (INDEPENDENT_AMBULATORY_CARE_PROVIDER_SITE_OTHER): Payer: Self-pay

## 2010-01-16 ENCOUNTER — Ambulatory Visit
Admission: RE | Admit: 2010-01-16 | Discharge: 2010-01-16 | Payer: Self-pay | Source: Home / Self Care | Attending: Internal Medicine | Admitting: Internal Medicine

## 2010-01-16 ENCOUNTER — Ambulatory Visit
Admission: RE | Admit: 2010-01-16 | Discharge: 2010-01-16 | Payer: Self-pay | Source: Home / Self Care | Attending: Gastroenterology | Admitting: Gastroenterology

## 2010-01-23 ENCOUNTER — Encounter: Payer: Self-pay | Admitting: Gastroenterology

## 2010-01-23 ENCOUNTER — Ambulatory Visit
Admission: RE | Admit: 2010-01-23 | Discharge: 2010-01-23 | Payer: Self-pay | Source: Home / Self Care | Attending: Gastroenterology | Admitting: Gastroenterology

## 2010-01-24 ENCOUNTER — Encounter: Payer: Self-pay | Admitting: Gastroenterology

## 2010-01-24 LAB — GLUCOSE, CAPILLARY
Glucose-Capillary: 173 mg/dL — ABNORMAL HIGH (ref 70–99)
Glucose-Capillary: 131 mg/dL — ABNORMAL HIGH (ref 70–99)

## 2010-01-28 ENCOUNTER — Encounter: Payer: Self-pay | Admitting: Internal Medicine

## 2010-02-06 NOTE — Assessment & Plan Note (Signed)
Summary: b-12 injection-/tlj /lb  Nurse Visit   Allergies: No Known Drug Allergies  Medication Administration  Injection # 1:    Medication: Vit B12 1000 mcg    Diagnosis: ANEMIA, B12 DEFICIENCY (ICD-281.1)    Route: IM    Site: L deltoid    Exp Date: 04/2011    Lot #: [id number]    Mfr: [place]  Orders Added: 1)  Admin of Therapeutic Inj  intramuscular or subcutaneous [96372] 2)  Vit B12 1000 mcg [id number]

## 2010-02-06 NOTE — Progress Notes (Signed)
Summary: Novofine PA  Phone Note Other Incoming   Caller: [facility] [phone] Summary of Call: Patient came for B-12 inj and gave me a letter from [facility] stating that the patient Novofine 30G 1/3" needles are not on the formulary and patient was provided a temporary supply. Initiated Prior authorization and they will fax forms. Initial call taken by: Ernestene Mention,  March 30, 2009 2:04 PM

## 2010-02-06 NOTE — Letter (Signed)
Summary: Results Follow-up Letter  [facility]   [place], [place] [address]   Phone: [phone]  Fax: [phone]    10/30/2009  [address] [place], [place]  [address]  Dear Mr. Cregger,   The following are the results of your recent test(s):  Test       Result     Kidney function     a little worse Blood sugars     very high Urine         trace of blood CBC         normal  _________________________________________________________  Please call for an appointment very soon _________________________________________________________ _________________________________________________________ _________________________________________________________  Sincerely,  [name] MD [facility]

## 2010-02-06 NOTE — Letter (Signed)
Summary: Results Letter  [facility]  [place], [place] [address]   Phone: [phone]  Fax: [phone]        December 14, 2009 MRN: [id number]    [address] [place] [address] [place], [place]  [address]    Dear [name],  It is my pleasure to have treated you recently as a new patient in my office. I appreciate your confidence and the opportunity to participate in your care.  Since I do have a busy inpatient endoscopy schedule and office schedule, my office hours vary weekly. I am, however, available for emergency calls everyday through my office. If I am not available for an urgent office appointment, another one of our gastroenterologist will be able to assist you.  My well-trained staff are prepared to help you at all times. For emergencies after office hours, a physician from our Gastroenterology section is always available through my 24 hour answering service  Once again I welcome you as a new patient and I look forward to a happy and healthy relationship             Sincerely,  Inda Castle MD  This letter has been electronically signed by your physician.  Appended Document: Results Letter LETTER MAILED
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
## 2010-02-06 NOTE — Letter (Signed)
Summary: Results Follow-up Letter  [facility]   [place], [place] [address]   Phone: [phone]  Fax: [phone]    06/30/2009  [address] [place], [place]  [address]  Dear Mr. Vise,   The following are the results of your recent test(s):  Test     Result     Blood sugars   way too high Kidney     function is stable Liver       normal CBC       low platelets Prostate     low, very good   _________________________________________________________  Please call for an appointment soon, I have referred you to a diabetes specialist _________________________________________________________ _________________________________________________________ _________________________________________________________  Sincerely,  [name] MD [facility]

## 2010-02-06 NOTE — Medication Information (Signed)
Summary: Novofine Needles/[facility]  Novofine Needles/[facility]   Imported By: Phillis Knack 04/08/2009 11:22:40  _____________________________________________________________________  External Attachment:    Type:   Image     Comment:   External Document

## 2010-02-06 NOTE — Assessment & Plan Note (Signed)
Summary: PER PT 1 MTH B12  D/T--TLJ  STC  Nurse Visit   Vitals Entered By: Estell Harpin CMA (November 28, 2009 2:53 PM)  Allergies: No Known Drug Allergies  Medication Administration  Injection # 1:    Medication: Vit B12 1000 mcg    Diagnosis: ANEMIA, B12 DEFICIENCY (ICD-281.1)    Route: IM    Site: R deltoid    Exp Date: 08/2011    Lot #: [id number]    Mfr: [place]    Patient tolerated injection without complications    Given by: Estell Harpin CMA (November 28, 2009 2:54 PM)  Orders Added: 1)  Vit B12 1000 mcg [J3420] 2)  Admin of Therapeutic Inj  intramuscular or subcutaneous [96372]    Medication Administration  Injection # 1:    Medication: Vit B12 1000 mcg    Diagnosis: ANEMIA, B12 DEFICIENCY (ICD-281.1)    Route: IM    Site: R deltoid    Exp Date: 08/2011    Lot #: [id number]    Mfr: [place]    Patient tolerated injection without complications    Given by: Estell Harpin CMA (November 28, 2009 2:54 PM)  Orders Added: 1)  Vit B12 1000 mcg [J3420] 2)  Admin of Therapeutic Inj  intramuscular or subcutaneous [id number]

## 2010-02-06 NOTE — Medication Information (Signed)
Summary: Formulary Letter Regarding Novofine Needles/[facility]  Formulary Letter Regarding Novofine Needles/[facility]   Imported By: Edmonia Nevan 04/03/2009 12:12:11  _____________________________________________________________________  External Attachment:    Type:   Image     Comment:   External Document

## 2010-02-06 NOTE — Medication Information (Signed)
Summary: Novofine Approved/[facility]  Novofine Approved/[facility]   Imported By: Phillis Knack 04/10/2009 10:03:42  _____________________________________________________________________  External Attachment:    Type:   Image     Comment:   External Document
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
## 2010-02-06 NOTE — Assessment & Plan Note (Signed)
Summary: b12/Robert Clayton/coming at 10:20am/cd  Nurse Visit   Vitals Entered By: Tomma Lightning (Jun 01, 2009 10:22 AM) CC: B12 injection   Allergies: No Known Drug Allergies  Medication Administration  Injection # 1:    Medication: Vit B12 1000 mcg    Diagnosis: ANEMIA, B12 DEFICIENCY (ICD-281.1)    Route: IM    Site: R deltoid    Exp Date: 11/2010    Lot #: [id number]    Mfr: American Regent    Patient tolerated injection without complications    Given by: Tomma Lightning (Jun 01, 2009 10:23 AM)  Orders Added: 1)  Vit B12 1000 mcg [J3420] 2)  Admin of Therapeutic Inj  intramuscular or subcutaneous [id number]

## 2010-02-06 NOTE — Letter (Signed)
Summary: Previsit letter  [facility]  [place], [place] [address]   Phone: [phone]  Fax: [phone]       07/21/2009 MRN: [id number]  Robert Clayton [address] [place], [place]  [address]  Dear Mr. Gren,  Welcome to the Gastroenterology Division at [facility]    You are scheduled to see a nurse for your pre-procedure visit on 09-05-09 at 10:30am on the 3rd floor at [facility] [place] [employer].  We ask that you try to arrive at our office 15 minutes prior to your appointment time to allow for check-in.  Your nurse visit will consist of discussing your medical and surgical history, your immediate family medical history, and your medications.    Please bring a complete list of all your medications or, if you prefer, bring the medication bottles and we will list them.  We will need to be aware of both prescribed and over the counter drugs.  We will need to know exact dosage information as well.  If you are on blood thinners (Coumadin, Plavix, Aggrenox, Ticlid, etc.) please call our office today/prior to your appointment, as we need to consult with your physician about holding your medication.   Please be prepared to read and sign documents such as consent forms, a financial agreement, and acknowledgement forms.  If necessary, and with your consent, a friend or relative is welcome to sit-in on the nurse visit with you.  Please bring your insurance card so that we may make a copy of it.  If your insurance requires a referral to see a specialist, please bring your referral form from your primary care physician.  No co-pay is required for this nurse visit.     If you cannot keep your appointment, please call [phone] to cancel or reschedule prior to your appointment date.  This allows [place] the opportunity to schedule an appointment for another patient in need of care.    Thank you for choosing [facility] for your medical needs.  We  appreciate the opportunity to care for you.  Please visit [place] at our website  to learn more about our practice.                     Sincerely.                                                                                                                   The Gastroenterology Division

## 2010-02-06 NOTE — Letter (Signed)
Summary: Results Follow-up Letter  [facility] [place]   [place], [place] [address]   Phone: [phone]  Fax: [phone]    01/26/2009  [address] [place], [place]  [address]  Dear Robert Clayton,   The following are the results of your recent test(s):  Test     Result     Kidney      worsened Blood sugars   way too high CBC       normal Urine       normal Gout level     good  _________________________________________________________  Please call for an appointment in 1-2 months, please stop lisinopril/hctz due to kidney dysfunction and start amlodipine which was ordered at your pharmacy _________________________________________________________ _________________________________________________________ _________________________________________________________  Sincerely,  [name] MD [facility]

## 2010-02-06 NOTE — Assessment & Plan Note (Signed)
Summary: b-12 injections-lb  Nurse Visit   Vitals Entered By: Rebeca Alert (May 02, 2009 10:20 AM) CC: pt came in for a B-12 injection/pt tolerated injection well/aj   Allergies: No Known Drug Allergies  Medication Administration  Injection # 1:    Medication: Vit B12 1000 mcg    Diagnosis: ANEMIA, B12 DEFICIENCY (ICD-281.1)    Route: IM    Site: R deltoid    Exp Date: 01/08/2011    Lot #: [id number]    Mfr: Pella    Patient tolerated injection without complications    Given by: Rebeca Alert (May 02, 2009 10:22 AM)  Orders Added: 1)  Vit B12 1000 mcg [J3420] 2)  Admin of Therapeutic Inj  intramuscular or subcutaneous [id number]

## 2010-02-06 NOTE — Progress Notes (Signed)
  Phone Note Outgoing Call   Initial call taken by: Janith Lima MD,  January 31, 2009 12:12 PM  New Problems: UNSPECIFIED RENAL SCLEROSIS (ICD-587)   New Problems: UNSPECIFIED RENAL SCLEROSIS (ICD-587)

## 2010-02-06 NOTE — Progress Notes (Signed)
Summary: CALL?   Phone Note Call from Patient   Summary of Call: Pt called and said someone called him but wasnt sure who. Dr Ronnald Ramp, did you call pt? I see there is a new referral in EMR but no explaination to tell the patient what u/s results were.  Initial call taken by: Charlsie Quest, [place],  January 31, 2009 3:51 PM  Follow-up for Phone Call        he has a shrunken kidney so we need to do an MRA Follow-up by: Janith Lima MD,  January 31, 2009 5:55 PM  Additional Follow-up for Phone Call Additional follow up Details #1::        Pt informed  Additional Follow-up by: Charlsie Quest, CMA,  February 02, 2009 4:09 PM
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
## 2010-02-06 NOTE — Progress Notes (Signed)
Summary: nos for pv  Spoke with Robert Clayton re: nos for pv today and he said he wasn't going to have a colonoscopy until his doctor [name] discussed it with him.
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
## 2010-02-06 NOTE — Assessment & Plan Note (Signed)
Summary: B12 Idelle Crouch Bonney Leitz  Nurse Visit   Allergies: No Known Drug Allergies  Medication Administration  Injection # 1:    Medication: Vit B12 1000 mcg    Diagnosis: ANEMIA, B12 DEFICIENCY (ICD-281.1)    Route: IM    Site: R deltoid    Exp Date: 11/08/2010    Lot #: [id number]    Mfr: [place]    Patient tolerated injection without complications    Given by: Ernestene Mention (March 30, 2009 2:02 PM)  Orders Added: 1)  Vit B12 1000 mcg [J3420] 2)  Admin of Therapeutic Inj  intramuscular or subcutaneous [id number]

## 2010-02-06 NOTE — Assessment & Plan Note (Signed)
Summary: B12 / TLJ /NWS  Nurse Visit   Allergies: No Known Drug Allergies  Medication Administration  Injection # 1:    Medication: Vit B12 1000 mcg    Diagnosis: ANEMIA, B12 DEFICIENCY (ICD-281.1)    Route: IM    Site: R deltoid    Exp Date: 06/2011    Lot #: [id number]    Mfr: [place]  Orders Added: 1)  Admin of Therapeutic Inj  intramuscular or subcutaneous [96372] 2)  Vit B12 1000 mcg [id number]
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
## 2010-02-06 NOTE — Letter (Signed)
Summary: Primary Care Appointment Letter  [facility] [place]   [place], [place] [address]   Phone: [phone]  Fax: [phone]    07/24/2009 MRN: [id number]  Robert Clayton [address] [place], [place]  [address]  Dear [name],   Your Primary Care Physician Janith Lima MD has indicated that:    ___X____it is time to schedule an appointment with Dr.Ellison, our Endocrinologist. Dr. Ronnald Ramp is referring you to him.  He is located in the same office that Dr. Ronnald Ramp is in.  Please call the office.    _______you missed your appointment on______ and need to call and          reschedule.    _______you need to have lab work done.    _______you need to schedule an appointment discuss lab or test results.    _______you need to call to reschedule your appointment that is                       scheduled on _________.     Please call our office as soon as possible. Our phone number is [phone]. Please press option 1. Our office is open 8a-12noon and 1p-5p, Monday through Friday.     Thank you,    [place]

## 2010-02-08 NOTE — Miscellaneous (Signed)
Summary: [facility] PV  Clinical Lists Changes  Observations: Added new observation of ALLERGY REV: Done (01/16/2010 9:32) Added new observation of NKA: T (01/16/2010 9:32)

## 2010-02-08 NOTE — Letter (Signed)
Summary: Appt Reminder [facility]  [place], [place] [address]   Phone: [phone]  Fax: [phone]        January 24, 2010 MRN: [id number]    Robert Clayton [address] [place], [place]  [address]    Dear [name],   You have a return appointment with Dr. Deatra Ina on 03/09/10 at 1:45pm.  Please remember to bring a complete list of the medicines you are taking, your insurance card and your co-pay.  If you have to cancel or reschedule this appointment, please call before 5:00 pm the evening before to avoid a cancellation fee.  If you have any questions or concerns, please call [phone].    Sincerely,    Rosanne Sack RN  Appended Document: Appt Reminder 2 Letter is mailed to the patient's home address

## 2010-02-08 NOTE — Letter (Signed)
Summary: Pre Visit Letter Revised  [facility]  [place], [place] [address]   Phone: [phone]  Fax: [phone]        12/19/2009 MRN: [id number] Robert Clayton [address], [place]  [address]             Procedure Date:  Tuesday 01/23/10  Welcome to the Gastroenterology Division at [facility].    You are scheduled to see a nurse for your pre-procedure visit on Tuesday 01/16/10 at 10:30am on the 3rd floor at [facility], [place] [employer] [facility] [employer].  We ask that you try to arrive at our office 15 minutes prior to your appointment time to allow for check-in.  Please take a minute to review the attached form.  If you answer "Yes" to one or more of the questions on the first page, we ask that you call the person listed at your earliest opportunity.  If you answer "No" to all of the questions, please complete the rest of the form and bring it to your appointment.    Your nurse visit will consist of discussing your medical and surgical history, your immediate family medical history, and your medications.   If you are unable to list all of your medications on the form, please bring the medication bottles to your appointment and we will list them.  We will need to be aware of both prescribed and over the counter drugs.  We will need to know exact dosage information as well.    Please be prepared to read and sign documents such as consent forms, a financial agreement, and acknowledgement forms.  If necessary, and with your consent, a friend or relative is welcome to sit-in on the nurse visit with you.  Please bring your insurance card so that we may make a copy of it.  If your insurance requires a referral to see a specialist, please bring your referral form from your primary care physician.  No co-pay is required for this nurse visit.     If you cannot keep your appointment, please call [phone] to cancel or reschedule prior to your appointment date.  This  allows [place] the opportunity to schedule an appointment for another patient in need of care.    Thank you for choosing [facility] for your medical needs.  We appreciate the opportunity to care for you.  Please visit [place] at our website  to learn more about our practice.  Sincerely, The Gastroenterology Division

## 2010-02-08 NOTE — Letter (Signed)
Summary: Diabetic Instructions  [facility]  [address]   [place], [place] [address]   Phone: [phone]  Fax: [phone]    Robert Clayton 04-05-1930 MRN: [id number]   x     ORAL DIABETIC MEDICATION INSTRUCTIONS  The day before your procedure:   Take your diabetic pill as you do normally  The day of your procedure:   Do not take your diabetic pill    We will check your blood sugar levels during the admission process and again in Recovery before discharging you home  ________________________________________________________________________   x   INSULIN (LONG ACTING) MEDICATION INSTRUCTIONS (Lantus, NPH, 70/30, Humulin, Novolin-N)   The day before your procedure:   Take  your regular evening dose    The day of your procedure:   Do not take your morning dose        These instructions were reviewed with me by Thurston Pounds, RN 01-16-10 @ 10:05am

## 2010-02-08 NOTE — Procedures (Signed)
Summary: Upper Endoscopy w/DIL  Patient: Robert Clayton Note: All result statuses are Final unless otherwise noted.  Tests: (1) Upper Endoscopy w/DIL (UED)  UED Upper Endoscopy w/DIL                             [address]     [place], [place]  [address]           ENDOSCOPY PROCEDURE REPORT           PATIENT:  Robert Clayton, Robert Clayton  MR#:  [id number]     BIRTHDATE:  1930/09/12, 79 yrs. old  GENDER:  male           ENDOSCOPIST:  Sandy Salaam. Deatra Ina, MD     ASSISTANT:           PROCEDURE DATE:  01/23/2010     PROCEDURE:  EGD, diagnostic, Maloney Dilation of the Esophagus     ASA CLASS:  Class II     INDICATIONS:  1) dysphagia           MEDICATIONS:   Fentanyl 50 mg IV, Versed 5 mg IV, glycopyrrolate     (Robinal) 0.2 mg IV, 0.6cc simethancone 0.6 cc PO     TOPICAL ANESTHETIC:  Exactacain Spray           DESCRIPTION OF PROCEDURE:   After the risks benefits and     alternatives of the procedure were thoroughly explained, informed     consent was obtained.  The [id number] endoscope was     introduced through the mouth and advanced to the third portion of     the duodenum, without limitations.  The instrument was slowly     withdrawn as the mucosa was carefully examined.     <<PROCEDUREIMAGES>>           A stricture was found at the gastroesophageal junction (see     image2). Early stricture  The examination was otherwise normal.     Dilation was then performed at the gastroesphageal junction           1) Dilator:  Maloney  Size(s):  18     Resistance:  moderate  Heme:  none     Appearance:           COMPLICATIONS:  None           ENDOSCOPIC IMPRESSION:     1) Stricture at the gastroesophageal junction - s/p maloney     dilitation     2) Otherwise normal examination.     RECOMMENDATIONS:     1) dilatations PRN     2) call office to schedule an office visit for _4-6 weeks           REPEAT EXAM:  No        ______________________________     Sandy Salaam. Deatra Ina, MD           CC:  Janith Lima, M.D.           n.     eSIGNED:   Sandy Salaam. Kaplan at 01/23/2010 05:03 PM           Inetta Fermo, [id number]  Note: An exclamation mark (!) indicates a result that was not dispersed into the flowsheet. Document Creation Date: 01/23/2010 5:04 PM _______________________________________________________________________  (1) Order result status: Final Collection or observation date-time: 01/23/2010 17:00 Requested  date-time:  Receipt date-time:  Reported date-time:  Referring Physician:   Ordering Physician: Erskine Emery [phone]) Specimen Source:  Source: Tawanna Cooler Order Number: [id number] Lab site:

## 2010-02-08 NOTE — Progress Notes (Signed)
       Additional Follow-up for Phone Call Additional follow up Details #2::    Patient scheduled for EGD with Dil for 01/23/10 @4pm . Patient to come for Previsit for 01/16/10 @10 :30am. Patient sent previsit letter and verbalizes understanding. Follow-up by: Rosanne Sack RN,  December 19, 2009 11:46 AM

## 2010-02-08 NOTE — Assessment & Plan Note (Signed)
Summary: B12/JSS  Nurse Visit   Allergies: No Known Drug Allergies  Medication Administration  Injection # 1:    Medication: Vit B12 1000 mcg    Diagnosis: ANEMIA, B12 DEFICIENCY (ICD-281.1)    Route: IM    Site: L deltoid    Exp Date: 09/08/2011    Lot #: [id number]    Mfr: [place]    Patient tolerated injection without complications    Given by: Charlsie Quest, CMA (January 17, 2010 2:19 PM)  Orders Added: 1)  Vit B12 1000 mcg [J3420] 2)  Admin of Therapeutic Inj  intramuscular or subcutaneous [96372]   Medication Administration  Injection # 1:    Medication: Vit B12 1000 mcg    Diagnosis: ANEMIA, B12 DEFICIENCY (ICD-281.1)    Route: IM    Site: L deltoid    Exp Date: 09/08/2011    Lot #: [id number]    Mfr: [place]    Patient tolerated injection without complications    Given by: Charlsie Quest, CMA (January 17, 2010 2:19 PM)  Orders Added: 1)  Vit B12 1000 mcg [J3420] 2)  Admin of Therapeutic Inj  intramuscular or subcutaneous [id number]

## 2010-02-08 NOTE — Letter (Signed)
Summary: EGD Instructions  [facility]  [address]   [place], [place] [address]   Phone: [phone]  Fax: [phone]       CLABORN KROLCZYK    04/01/1930    MRN: [id number]       Procedure Day Sudie Grumbling: Tuesday, 01-23-10     Arrival Time: 3:00 p.m.     Procedure Time: 4:00 p.m.     Location of Procedure:                    x   [facility] (4th Floor)    PREPARATION FOR ENDOSCOPY   On 01-23-10  THE DAY OF THE PROCEDURE:  1.   No solid foods, milk or milk products are allowed after midnight the night before your procedure.  2.   Do not drink anything colored red or purple.  Avoid juices with pulp.  No orange juice.  3.  You may drink clear liquids until 2:00 p.m., which is 2 hours before your procedure.                                                                                                CLEAR LIQUIDS INCLUDE: Water Jello Ice Popsicles Tea (sugar ok, no milk/cream) Powdered fruit flavored drinks Coffee (sugar ok, no milk/cream) Gatorade Juice: apple, white grape, white cranberry  Lemonade Clear bullion, consomm, broth Carbonated beverages (any kind) Strained chicken noodle soup Hard Candy   MEDICATION INSTRUCTIONS  Unless otherwise instructed, you should take regular prescription medications with a small sip of water as early as possible the morning of your procedure.  Diabetic patients - see separate instructions.             OTHER INSTRUCTIONS  You will need a responsible adult at least 75 years of age to accompany you and drive you home.   This person must remain in the waiting room during your procedure.  Wear loose fitting clothing that is easily removed.  Leave jewelry and other valuables at home.  However, you may wish to bring a book to read or an iPod/MP3 player to listen to music as you wait for your procedure to start.  Remove all body piercing jewelry and leave at home.  Total time from sign-in until  discharge is approximately 2-3 hours.  You should go home directly after your procedure and rest.  You can resume normal activities the day after your procedure.  The day of your procedure you should not:   Drive   Make legal decisions   Operate machinery   Drink alcohol   Return to work  You will receive specific instructions about eating, activities and medications before you leave.    The above instructions have been reviewed and explained to me by   Thurston Pounds RN II  January 16, 2010 10:04 AM     I fully understand and can verbalize these instructions _____________________________ Date _________

## 2010-02-27 ENCOUNTER — Other Ambulatory Visit: Payer: Self-pay | Admitting: Internal Medicine

## 2010-02-27 ENCOUNTER — Encounter: Payer: Self-pay | Admitting: Internal Medicine

## 2010-02-27 ENCOUNTER — Other Ambulatory Visit: Payer: Medicare Other

## 2010-02-27 ENCOUNTER — Encounter (INDEPENDENT_AMBULATORY_CARE_PROVIDER_SITE_OTHER): Payer: Self-pay | Admitting: *Deleted

## 2010-02-27 ENCOUNTER — Ambulatory Visit (INDEPENDENT_AMBULATORY_CARE_PROVIDER_SITE_OTHER): Payer: Medicare Other | Admitting: Internal Medicine

## 2010-02-27 DIAGNOSIS — D518 Other vitamin B12 deficiency anemias: Secondary | ICD-10-CM

## 2010-02-27 DIAGNOSIS — R079 Chest pain, unspecified: Secondary | ICD-10-CM

## 2010-02-27 DIAGNOSIS — I2510 Atherosclerotic heart disease of native coronary artery without angina pectoris: Secondary | ICD-10-CM

## 2010-02-27 DIAGNOSIS — E1165 Type 2 diabetes mellitus with hyperglycemia: Secondary | ICD-10-CM

## 2010-02-27 DIAGNOSIS — E1149 Type 2 diabetes mellitus with other diabetic neurological complication: Secondary | ICD-10-CM

## 2010-02-27 DIAGNOSIS — N181 Chronic kidney disease, stage 1: Secondary | ICD-10-CM

## 2010-02-27 DIAGNOSIS — M109 Gout, unspecified: Secondary | ICD-10-CM

## 2010-02-27 DIAGNOSIS — I10 Essential (primary) hypertension: Secondary | ICD-10-CM

## 2010-02-27 DIAGNOSIS — E785 Hyperlipidemia, unspecified: Secondary | ICD-10-CM

## 2010-02-27 DIAGNOSIS — E1169 Type 2 diabetes mellitus with other specified complication: Secondary | ICD-10-CM

## 2010-02-27 LAB — BASIC METABOLIC PANEL
Chloride: 113 mEq/L — ABNORMAL HIGH (ref 96–112)
GFR: 46.37 mL/min — ABNORMAL LOW (ref >60.00)
Potassium: 4.5 mEq/L (ref 3.5–5.1)
Sodium: 145 mEq/L (ref 135–145)

## 2010-02-27 LAB — CBC WITH DIFFERENTIAL/PLATELET
Basophils Relative: 0.4 % (ref 0.0–3.0)
Eosinophils Relative: 2.4 % (ref 0.0–5.0)
HCT: 39.6 % (ref 39.0–52.0)
Hemoglobin: 13.4 g/dL (ref 13.0–17.0)
Lymphs Abs: 2.2 10*3/uL (ref 0.7–4.0)
MCV: 91.8 fl (ref 78.0–100.0)
Monocytes Relative: 7.2 % (ref 3.0–12.0)
Neutro Abs: 5.9 10*3/uL (ref 1.4–7.7)
Platelets: 97 10*3/uL — ABNORMAL LOW (ref 150.0–400.0)
RBC: 4.31 Mil/uL (ref 4.22–5.81)
WBC: 9 10*3/uL (ref 4.5–10.5)

## 2010-02-27 LAB — HEPATIC FUNCTION PANEL
ALT: 15 U/L (ref 0–53)
AST: 15 U/L (ref 0–37)
Albumin: 3.8 g/dL (ref 3.5–5.2)
Alkaline Phosphatase: 74 U/L (ref 39–117)
Total Protein: 6.9 g/dL (ref 6.0–8.3)

## 2010-02-27 LAB — LIPID PANEL
HDL: 31.3 mg/dL — ABNORMAL LOW (ref >39.00)
Total CHOL/HDL Ratio: 4
VLDL: 43.8 mg/dL — ABNORMAL HIGH (ref 0.0–40.0)

## 2010-02-27 LAB — TSH: TSH: 1.66 u[IU]/mL (ref 0.35–5.50)

## 2010-02-28 ENCOUNTER — Other Ambulatory Visit: Payer: Medicare Other

## 2010-02-28 ENCOUNTER — Other Ambulatory Visit: Payer: Self-pay | Admitting: Internal Medicine

## 2010-02-28 ENCOUNTER — Telehealth: Payer: Self-pay | Admitting: Internal Medicine

## 2010-02-28 DIAGNOSIS — R7989 Other specified abnormal findings of blood chemistry: Secondary | ICD-10-CM

## 2010-02-28 DIAGNOSIS — I2699 Other pulmonary embolism without acute cor pulmonale: Secondary | ICD-10-CM

## 2010-02-28 LAB — LDL CHOLESTEROL, DIRECT: Direct LDL: 77.8 mg/dL

## 2010-03-01 ENCOUNTER — Ambulatory Visit (INDEPENDENT_AMBULATORY_CARE_PROVIDER_SITE_OTHER)
Admission: RE | Admit: 2010-03-01 | Discharge: 2010-03-01 | Disposition: A | Discharge status: Home / Self Care | Payer: Medicare Other | Source: Ambulatory Visit | Attending: Internal Medicine | Admitting: Internal Medicine

## 2010-03-01 ENCOUNTER — Encounter: Payer: Self-pay | Admitting: Internal Medicine

## 2010-03-01 DIAGNOSIS — I2699 Other pulmonary embolism without acute cor pulmonale: Secondary | ICD-10-CM

## 2010-03-01 HISTORY — DX: Essential (primary) hypertension: I10

## 2010-03-01 MED ORDER — IOHEXOL 300 MG/ML  SOLN
100.0000 mL | Freq: Once | INTRAMUSCULAR | Status: AC | PRN
  Administered 2010-03-01: 80 mL via INTRAVENOUS

## 2010-03-06 NOTE — Progress Notes (Signed)
     Follow-up for Phone Call       Follow-up by: Janith Lima MD,  February 28, 2010 8:50 AM

## 2010-03-06 NOTE — Letter (Signed)
Summary: Results Follow-up Letter  [facility] [address]   [place], [place] [address]   Phone: [phone]  Fax: [phone]    03/01/2010  [address] [place], [place]  [address]  [place]  Dear Mr. Aiken,   The following are the results of your recent test(s):  Test     Result     Kidneys     mild dysfunction Blood sugars   very high Liver       normal    _________________________________________________________  Please call for an appointment soon _________________________________________________________ _________________________________________________________ _________________________________________________________  Sincerely,  [name] MD [facility]
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
## 2010-03-06 NOTE — Letter (Signed)
Summary: Lipid Letter  [facility]   [place], [place] [address]   Phone: [phone]  Fax: [phone]    03/01/2010  Robert Clayton [address] [place], [place]  [address]  Dear Robert Clayton:  We have carefully reviewed your last lipid profile from 10/24/2009 and the results are noted below with a summary of recommendations for lipid management.    Cholesterol:       137     Goal: <200   HDL "good" Cholesterol:   31.30     Goal: >40   LDL "bad" Cholesterol:   71     Goal: <70   Triglycerides:       219.0     Goal: <150        TLC Diet (Therapeutic Lifestyle Change): Saturated Fats & Transfatty acids should be kept < 7% of total calories ***Reduce Saturated Fats Polyunstaurated Fat can be up to 10% of total calories Monounsaturated Fat Fat can be up to 20% of total calories Total Fat should be no greater than 25-35% of total calories Carbohydrates should be 50-60% of total calories Protein should be approximately 15% of total calories Fiber should be at least 20-30 grams a day ***Increased fiber may help lower LDL Total Cholesterol should be < 200mg /day Consider adding plant stanol/sterols to diet (example: Benacol spread) ***A higher intake of unsaturated fat may reduce Triglycerides and Increase HDL    Adjunctive Measures (may lower LIPIDS and reduce risk of Heart Attack) include: Aerobic Exercise (20-30 minutes 3-4 times a week) Limit Alcohol Consumption Weight Reduction Aspirin 75-81 mg a day by mouth (if not allergic or contraindicated) Dietary Fiber 20-30 grams a day by mouth     Current Medications: 1)    Metoprolol Tartrate 50 Mg Tabs (Metoprolol tartrate) .... Take one tablet by mouth twice a day 2)    Simvastatin 20 Mg Tabs (Simvastatin) .... Take one tablet by mouth daily at bedtime 3)    Lantus Solostar 100 Unit/ml Soln (Insulin glargine) .... 50 units subcutaneously once daily 4)    Bayer Contour Test  Strp (Glucose blood) .... Use two  times a day as directed 5)    Contour Usb Blood Glucose Sys W/device Kit (Blood glucose monitoring suppl) .... Use two times a day as directed 6)    Glipizide 10 Mg Tabs (Glipizide) .... Take 1 two times a day 7)    Allopurinol 300 Mg Tabs (Allopurinol) .... Take 1 tablet by mouth once a day 8)    Isosorbide Mononitrate Cr 60 Mg Xr24h-tab (Isosorbide mononitrate) .... Take one tablet by mouth daily 9)    Amlodipine Besylate 5 Mg Tabs (Amlodipine besylate) .... One by mouth once daily for high blood pressure 10)    Lisinopril 10 Mg Tabs (Lisinopril) .... Take one tablet by mouth daily 11)    Novofine 30g T 1/3 Needles  .... Use two times a day as directed 12)    Nitrostat 0.4 Mg Subl (Nitroglycerin) .[name].. 1 tablet under tongue at onset of chest pain; you may repeat every 5 minutes for up to 3 doses.  If you have any questions, please call. We appreciate being able to work with you.   Sincerely,    [facility] Janith Lima MD

## 2010-03-09 ENCOUNTER — Ambulatory Visit (INDEPENDENT_AMBULATORY_CARE_PROVIDER_SITE_OTHER): Payer: Medicare Other | Admitting: Gastroenterology

## 2010-03-09 ENCOUNTER — Encounter: Payer: Self-pay | Admitting: Gastroenterology

## 2010-03-09 DIAGNOSIS — K222 Esophageal obstruction: Secondary | ICD-10-CM
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
## 2010-03-29 ENCOUNTER — Ambulatory Visit: Payer: Medicare Other | Admitting: *Deleted

## 2010-03-29 DIAGNOSIS — D518 Other vitamin B12 deficiency anemias: Secondary | ICD-10-CM

## 2010-03-29 DIAGNOSIS — E538 Deficiency of other specified B group vitamins: Secondary | ICD-10-CM

## 2010-03-29 MED ORDER — CYANOCOBALAMIN 1000 MCG/ML IJ SOLN
1000.0000 ug | Freq: Once | INTRAMUSCULAR | Status: AC
  Administered 2010-03-29: 1000 ug via INTRAMUSCULAR

## 2010-04-04 ENCOUNTER — Telehealth: Payer: Self-pay | Admitting: Internal Medicine

## 2010-04-04 MED ORDER — SIMVASTATIN 20 MG PO TABS: 20.0000 mg | ORAL_TABLET | Freq: Every day | ORAL | Status: DC

## 2010-04-04 NOTE — Telephone Encounter (Signed)
done

## 2010-04-18 ENCOUNTER — Other Ambulatory Visit: Payer: Self-pay | Admitting: Internal Medicine

## 2010-04-18 LAB — GLUCOSE, CAPILLARY
Glucose-Capillary: 105 mg/dL — ABNORMAL HIGH (ref 70–99)
Glucose-Capillary: 111 mg/dL — ABNORMAL HIGH (ref 70–99)
Glucose-Capillary: 120 mg/dL — ABNORMAL HIGH (ref 70–99)
Glucose-Capillary: 128 mg/dL — ABNORMAL HIGH (ref 70–99)
Glucose-Capillary: 130 mg/dL — ABNORMAL HIGH (ref 70–99)
Glucose-Capillary: 130 mg/dL — ABNORMAL HIGH (ref 70–99)
Glucose-Capillary: 134 mg/dL — ABNORMAL HIGH (ref 70–99)
Glucose-Capillary: 138 mg/dL — ABNORMAL HIGH (ref 70–99)
Glucose-Capillary: 151 mg/dL — ABNORMAL HIGH (ref 70–99)
Glucose-Capillary: 153 mg/dL — ABNORMAL HIGH (ref 70–99)
Glucose-Capillary: 157 mg/dL — ABNORMAL HIGH (ref 70–99)
Glucose-Capillary: 162 mg/dL — ABNORMAL HIGH (ref 70–99)
Glucose-Capillary: 170 mg/dL — ABNORMAL HIGH (ref 70–99)
Glucose-Capillary: 180 mg/dL — ABNORMAL HIGH (ref 70–99)
Glucose-Capillary: 183 mg/dL — ABNORMAL HIGH (ref 70–99)
Glucose-Capillary: 185 mg/dL — ABNORMAL HIGH (ref 70–99)
Glucose-Capillary: 193 mg/dL — ABNORMAL HIGH (ref 70–99)
Glucose-Capillary: 198 mg/dL — ABNORMAL HIGH (ref 70–99)
Glucose-Capillary: 199 mg/dL — ABNORMAL HIGH (ref 70–99)
Glucose-Capillary: 214 mg/dL — ABNORMAL HIGH (ref 70–99)
Glucose-Capillary: 215 mg/dL — ABNORMAL HIGH (ref 70–99)
Glucose-Capillary: 222 mg/dL — ABNORMAL HIGH (ref 70–99)
Glucose-Capillary: 224 mg/dL — ABNORMAL HIGH (ref 70–99)
Glucose-Capillary: 248 mg/dL — ABNORMAL HIGH (ref 70–99)
Glucose-Capillary: 40 mg/dL — ABNORMAL LOW (ref 70–99)
Glucose-Capillary: 49 mg/dL — ABNORMAL LOW (ref 70–99)
Glucose-Capillary: 71 mg/dL (ref 70–99)
Glucose-Capillary: 75 mg/dL (ref 70–99)
Glucose-Capillary: 77 mg/dL (ref 70–99)
Glucose-Capillary: 78 mg/dL (ref 70–99)
Glucose-Capillary: 82 mg/dL (ref 70–99)
Glucose-Capillary: 85 mg/dL (ref 70–99)
Glucose-Capillary: 95 mg/dL (ref 70–99)

## 2010-04-18 LAB — DIFFERENTIAL
Basophils Absolute: 0 10*3/uL (ref 0.0–0.1)
Basophils Relative: 0 % (ref 0–1)
Eosinophils Absolute: 0.1 10*3/uL (ref 0.0–0.7)
Eosinophils Relative: 1 % (ref 0–5)
Eosinophils Relative: 1 % (ref 0–5)
Lymphocytes Relative: 26 % (ref 12–46)
Lymphs Abs: 1.2 10*3/uL (ref 0.7–4.0)
Monocytes Absolute: 0.6 10*3/uL (ref 0.1–1.0)
Monocytes Relative: 7 % (ref 3–12)

## 2010-04-18 LAB — BASIC METABOLIC PANEL
BUN: 15 mg/dL (ref 6–23)
BUN: 38 mg/dL — ABNORMAL HIGH (ref 6–23)
CO2: 23 mEq/L (ref 19–32)
CO2: 30 mEq/L (ref 19–32)
Chloride: 102 mEq/L (ref 96–112)
Chloride: 110 mEq/L (ref 96–112)
Creatinine, Ser: 1.48 mg/dL (ref 0.4–1.5)
Creatinine, Ser: 2.02 mg/dL — ABNORMAL HIGH (ref 0.4–1.5)
GFR calc Af Amer: >60 mL/min (ref >60)
GFR calc non Af Amer: >60 mL/min (ref >60)
Glucose, Bld: 77 mg/dL (ref 70–99)
Glucose, Bld: 98 mg/dL (ref 70–99)
Potassium: 4.5 mEq/L (ref 3.5–5.1)
Potassium: 4.7 mEq/L (ref 3.5–5.1)
Sodium: 139 mEq/L (ref 135–145)

## 2010-04-18 LAB — URINE CULTURE: Colony Count: >=100000

## 2010-04-18 LAB — POCT I-STAT, CHEM 8
Calcium, Ion: 1.29 mmol/L (ref 1.12–1.32)
HCT: 31 % — ABNORMAL LOW (ref 39.0–52.0)
Hemoglobin: 10.5 g/dL — ABNORMAL LOW (ref 13.0–17.0)
TCO2: 22 mmol/L (ref 0–100)

## 2010-04-18 LAB — CBC
HCT: 30 % — ABNORMAL LOW (ref 39.0–52.0)
HCT: 26 % — ABNORMAL LOW (ref 39.0–52.0)
HCT: 31.5 % — ABNORMAL LOW (ref 39.0–52.0)
Hemoglobin: 10.3 g/dL — ABNORMAL LOW (ref 13.0–17.0)
Hemoglobin: 9.8 g/dL — ABNORMAL LOW (ref 13.0–17.0)
MCHC: 33.6 g/dL (ref 30.0–36.0)
MCHC: 33.7 g/dL (ref 30.0–36.0)
MCV: 91.3 fL (ref 78.0–100.0)
MCV: 92.5 fL (ref 78.0–100.0)
Platelets: 188 10*3/uL (ref 150–400)
Platelets: 212 10*3/uL (ref 150–400)
RBC: 3.2 MIL/uL — ABNORMAL LOW (ref 4.22–5.81)
RBC: 3.44 MIL/uL — ABNORMAL LOW (ref 4.22–5.81)
RDW: 16 % — ABNORMAL HIGH (ref 11.5–15.5)
RDW: 15 % (ref 11.5–15.5)
WBC: 7.9 10*3/uL (ref 4.0–10.5)
WBC: 12 10*3/uL — ABNORMAL HIGH (ref 4.0–10.5)
WBC: 14.9 10*3/uL — ABNORMAL HIGH (ref 4.0–10.5)

## 2010-04-18 LAB — COMPREHENSIVE METABOLIC PANEL
ALT: 21 U/L (ref 0–53)
ALT: 46 U/L (ref 0–53)
AST: 22 U/L (ref 0–37)
AST: 46 U/L — ABNORMAL HIGH (ref 0–37)
Alkaline Phosphatase: 64 U/L (ref 39–117)
Alkaline Phosphatase: 70 U/L (ref 39–117)
CO2: 22 mEq/L (ref 19–32)
Calcium: 8.8 mg/dL (ref 8.4–10.5)
Calcium: 9.4 mg/dL (ref 8.4–10.5)
GFR calc Af Amer: 55 mL/min — ABNORMAL LOW (ref >60)
GFR calc Af Amer: >60 mL/min (ref >60)
Glucose, Bld: 147 mg/dL — ABNORMAL HIGH (ref 70–99)
Glucose, Bld: 214 mg/dL — ABNORMAL HIGH (ref 70–99)
Potassium: 4.3 mEq/L (ref 3.5–5.1)
Potassium: 5.5 mEq/L — ABNORMAL HIGH (ref 3.5–5.1)
Sodium: 135 mEq/L (ref 135–145)
Sodium: 138 mEq/L (ref 135–145)
Total Protein: 6.2 g/dL (ref 6.0–8.3)
Total Protein: 6.8 g/dL (ref 6.0–8.3)

## 2010-04-18 LAB — POCT CARDIAC MARKERS

## 2010-04-18 LAB — URINALYSIS, ROUTINE W REFLEX MICROSCOPIC
Bilirubin Urine: NEGATIVE
Hgb urine dipstick: NEGATIVE
Ketones, ur: NEGATIVE mg/dL
Nitrite: NEGATIVE
pH: 6 (ref 5.0–8.0)

## 2010-04-18 LAB — PROTIME-INR: Prothrombin Time: 14.7 seconds (ref 11.6–15.2)

## 2010-04-19 LAB — GLUCOSE, CAPILLARY
Glucose-Capillary: 107 mg/dL — ABNORMAL HIGH (ref 70–99)
Glucose-Capillary: 107 mg/dL — ABNORMAL HIGH (ref 70–99)
Glucose-Capillary: 113 mg/dL — ABNORMAL HIGH (ref 70–99)
Glucose-Capillary: 120 mg/dL — ABNORMAL HIGH (ref 70–99)
Glucose-Capillary: 125 mg/dL — ABNORMAL HIGH (ref 70–99)
Glucose-Capillary: 128 mg/dL — ABNORMAL HIGH (ref 70–99)
Glucose-Capillary: 132 mg/dL — ABNORMAL HIGH (ref 70–99)
Glucose-Capillary: 133 mg/dL — ABNORMAL HIGH (ref 70–99)
Glucose-Capillary: 146 mg/dL — ABNORMAL HIGH (ref 70–99)
Glucose-Capillary: 147 mg/dL — ABNORMAL HIGH (ref 70–99)
Glucose-Capillary: 149 mg/dL — ABNORMAL HIGH (ref 70–99)
Glucose-Capillary: 152 mg/dL — ABNORMAL HIGH (ref 70–99)
Glucose-Capillary: 156 mg/dL — ABNORMAL HIGH (ref 70–99)
Glucose-Capillary: 168 mg/dL — ABNORMAL HIGH (ref 70–99)
Glucose-Capillary: 170 mg/dL — ABNORMAL HIGH (ref 70–99)
Glucose-Capillary: 171 mg/dL — ABNORMAL HIGH (ref 70–99)
Glucose-Capillary: 171 mg/dL — ABNORMAL HIGH (ref 70–99)
Glucose-Capillary: 186 mg/dL — ABNORMAL HIGH (ref 70–99)
Glucose-Capillary: 191 mg/dL — ABNORMAL HIGH (ref 70–99)
Glucose-Capillary: 206 mg/dL — ABNORMAL HIGH (ref 70–99)
Glucose-Capillary: 206 mg/dL — ABNORMAL HIGH (ref 70–99)
Glucose-Capillary: 218 mg/dL — ABNORMAL HIGH (ref 70–99)
Glucose-Capillary: 225 mg/dL — ABNORMAL HIGH (ref 70–99)
Glucose-Capillary: 235 mg/dL — ABNORMAL HIGH (ref 70–99)
Glucose-Capillary: 238 mg/dL — ABNORMAL HIGH (ref 70–99)
Glucose-Capillary: 243 mg/dL — ABNORMAL HIGH (ref 70–99)
Glucose-Capillary: 244 mg/dL — ABNORMAL HIGH (ref 70–99)
Glucose-Capillary: 89 mg/dL (ref 70–99)

## 2010-04-19 LAB — COMPREHENSIVE METABOLIC PANEL
ALT: 19 U/L (ref 0–53)
AST: 21 U/L (ref 0–37)
Albumin: 3.7 g/dL (ref 3.5–5.2)
Alkaline Phosphatase: 66 U/L (ref 39–117)
BUN: 34 mg/dL — ABNORMAL HIGH (ref 6–23)
CO2: 26 mEq/L (ref 19–32)
Calcium: 9.5 mg/dL (ref 8.4–10.5)
Calcium: 9.6 mg/dL (ref 8.4–10.5)
Creatinine, Ser: 1.87 mg/dL — ABNORMAL HIGH (ref 0.4–1.5)
GFR calc Af Amer: 43 mL/min — ABNORMAL LOW (ref >60)
GFR calc non Af Amer: 30 mL/min — ABNORMAL LOW (ref >60)
GFR calc non Af Amer: 35 mL/min — ABNORMAL LOW (ref >60)
Glucose, Bld: 151 mg/dL — ABNORMAL HIGH (ref 70–99)
Potassium: 4.7 mEq/L (ref 3.5–5.1)
Sodium: 136 mEq/L (ref 135–145)
Total Protein: 6.8 g/dL (ref 6.0–8.3)
Total Protein: 7.4 g/dL (ref 6.0–8.3)

## 2010-04-19 LAB — DIFFERENTIAL
Basophils Absolute: 0 10*3/uL (ref 0.0–0.1)
Basophils Relative: 0 % (ref 0–1)
Eosinophils Absolute: 0.1 10*3/uL (ref 0.0–0.7)
Eosinophils Relative: 1 % (ref 0–5)
Lymphocytes Relative: 12 % (ref 12–46)
Lymphocytes Relative: 25 % (ref 12–46)
Lymphs Abs: 1.3 10*3/uL (ref 0.7–4.0)
Monocytes Absolute: 1 10*3/uL (ref 0.1–1.0)
Monocytes Relative: 9 % (ref 3–12)
Monocytes Relative: 9 % (ref 3–12)
Neutro Abs: 5.4 10*3/uL (ref 1.7–7.7)
Neutro Abs: 8.7 10*3/uL — ABNORMAL HIGH (ref 1.7–7.7)
Neutrophils Relative %: 75 % (ref 43–77)

## 2010-04-19 LAB — CBC
HCT: 30.3 % — ABNORMAL LOW (ref 39.0–52.0)
HCT: 31.6 % — ABNORMAL LOW (ref 39.0–52.0)
HCT: 31.8 % — ABNORMAL LOW (ref 39.0–52.0)
HCT: 34.1 % — ABNORMAL LOW (ref 39.0–52.0)
Hemoglobin: 10.2 g/dL — ABNORMAL LOW (ref 13.0–17.0)
Hemoglobin: 10.7 g/dL — ABNORMAL LOW (ref 13.0–17.0)
Hemoglobin: 11.5 g/dL — ABNORMAL LOW (ref 13.0–17.0)
MCHC: 33.6 g/dL (ref 30.0–36.0)
MCHC: 33.8 g/dL (ref 30.0–36.0)
MCHC: 34 g/dL (ref 30.0–36.0)
MCHC: 34.3 g/dL (ref 30.0–36.0)
MCV: 91.7 fL (ref 78.0–100.0)
MCV: 92.4 fL (ref 78.0–100.0)
MCV: 92.6 fL (ref 78.0–100.0)
MCV: 93.1 fL (ref 78.0–100.0)
Platelets: 128 10*3/uL — ABNORMAL LOW (ref 150–400)
Platelets: 221 10*3/uL (ref 150–400)
Platelets: 86 10*3/uL — ABNORMAL LOW (ref 150–400)
Platelets: 87 10*3/uL — ABNORMAL LOW (ref 150–400)
RBC: 3.42 MIL/uL — ABNORMAL LOW (ref 4.22–5.81)
RBC: 3.71 MIL/uL — ABNORMAL LOW (ref 4.22–5.81)
RDW: 15.1 % (ref 11.5–15.5)
RDW: 15.2 % (ref 11.5–15.5)
RDW: 15.3 % (ref 11.5–15.5)
RDW: 15.3 % (ref 11.5–15.5)
RDW: 15.5 % (ref 11.5–15.5)
RDW: 15.8 % — ABNORMAL HIGH (ref 11.5–15.5)
WBC: 8.5 10*3/uL (ref 4.0–10.5)
WBC: 9.6 10*3/uL (ref 4.0–10.5)

## 2010-04-19 LAB — APTT
aPTT: 24 seconds (ref 24–37)
aPTT: 26 seconds (ref 24–37)

## 2010-04-19 LAB — BASIC METABOLIC PANEL
BUN: 14 mg/dL (ref 6–23)
BUN: 18 mg/dL (ref 6–23)
BUN: 41 mg/dL — ABNORMAL HIGH (ref 6–23)
CO2: 21 mEq/L (ref 19–32)
CO2: 23 mEq/L (ref 19–32)
CO2: 27 mEq/L (ref 19–32)
Calcium: 9.5 mg/dL (ref 8.4–10.5)
Calcium: 9.5 mg/dL (ref 8.4–10.5)
Calcium: 9.5 mg/dL (ref 8.4–10.5)
Chloride: 109 mEq/L (ref 96–112)
Chloride: 111 mEq/L (ref 96–112)
Creatinine, Ser: 1.73 mg/dL — ABNORMAL HIGH (ref 0.4–1.5)
Creatinine, Ser: 2.14 mg/dL — ABNORMAL HIGH (ref 0.4–1.5)
GFR calc Af Amer: 37 mL/min — ABNORMAL LOW (ref >60)
GFR calc Af Amer: 47 mL/min — ABNORMAL LOW (ref >60)
GFR calc Af Amer: 55 mL/min — ABNORMAL LOW (ref >60)
GFR calc non Af Amer: 30 mL/min — ABNORMAL LOW (ref >60)
GFR calc non Af Amer: 39 mL/min — ABNORMAL LOW (ref >60)
GFR calc non Af Amer: 44 mL/min — ABNORMAL LOW (ref >60)
Glucose, Bld: 183 mg/dL — ABNORMAL HIGH (ref 70–99)
Potassium: 4.2 mEq/L (ref 3.5–5.1)
Potassium: 4.5 mEq/L (ref 3.5–5.1)
Potassium: 4.8 mEq/L (ref 3.5–5.1)
Sodium: 133 mEq/L — ABNORMAL LOW (ref 135–145)
Sodium: 138 mEq/L (ref 135–145)
Sodium: 138 mEq/L (ref 135–145)
Sodium: 140 mEq/L (ref 135–145)

## 2010-04-19 LAB — TYPE AND SCREEN

## 2010-04-19 LAB — PREPARE PLATELETS

## 2010-04-19 LAB — POCT I-STAT, CHEM 8
Creatinine, Ser: 2 mg/dL — ABNORMAL HIGH (ref 0.4–1.5)
Hemoglobin: 12.6 g/dL — ABNORMAL LOW (ref 13.0–17.0)
Sodium: 143 mEq/L (ref 135–145)
TCO2: 22 mmol/L (ref 0–100)

## 2010-04-19 LAB — URINALYSIS, ROUTINE W REFLEX MICROSCOPIC
Bilirubin Urine: NEGATIVE
Hgb urine dipstick: NEGATIVE
Specific Gravity, Urine: 1.017 (ref 1.005–1.030)
Urobilinogen, UA: 0.2 mg/dL (ref 0.0–1.0)

## 2010-04-19 LAB — PREPARE FRESH FROZEN PLASMA

## 2010-04-19 LAB — URINE CULTURE: Culture: NO GROWTH

## 2010-04-19 LAB — PROTIME-INR
INR: 1.1 (ref 0.00–1.49)
Prothrombin Time: 13.7 seconds (ref 11.6–15.2)

## 2010-04-19 LAB — ABO/RH: ABO/RH(D): AB POS

## 2010-04-24 ENCOUNTER — Other Ambulatory Visit: Payer: Self-pay | Admitting: Internal Medicine

## 2010-04-28 ENCOUNTER — Encounter: Payer: Self-pay | Admitting: Cardiovascular Disease

## 2010-04-30 ENCOUNTER — Ambulatory Visit: Payer: Medicare Other | Admitting: *Deleted

## 2010-04-30 DIAGNOSIS — D518 Other vitamin B12 deficiency anemias: Secondary | ICD-10-CM

## 2010-04-30 MED ORDER — CYANOCOBALAMIN 1000 MCG/ML IJ SOLN
1000.0000 ug | Freq: Once | INTRAMUSCULAR | Status: AC
  Administered 2010-04-30: 1000 ug via INTRAMUSCULAR

## 2010-05-01 ENCOUNTER — Encounter: Payer: Self-pay | Admitting: Cardiovascular Disease

## 2010-05-01 ENCOUNTER — Ambulatory Visit (INDEPENDENT_AMBULATORY_CARE_PROVIDER_SITE_OTHER): Payer: Medicare Other | Admitting: Cardiovascular Disease

## 2010-05-01 DIAGNOSIS — I2510 Atherosclerotic heart disease of native coronary artery without angina pectoris: Secondary | ICD-10-CM

## 2010-05-01 DIAGNOSIS — E785 Hyperlipidemia, unspecified: Secondary | ICD-10-CM

## 2010-05-01 DIAGNOSIS — I10 Essential (primary) hypertension: Secondary | ICD-10-CM

## 2010-05-01 NOTE — Patient Instructions (Signed)
Your physician wants you to follow-up in: 6 months. You will receive a reminder letter in the mail two months in advance. If you don't receive a letter, please call our office to schedule the follow-up appointment.  Your physician has recommended you make the following change in your medication: Restart Aspirin 81mg  one tablet daily.

## 2010-05-07 NOTE — Assessment & Plan Note (Signed)
LDL at goal, but pt has metabolic syndrome values with low HDL and high triglycerides. We discussed diet and weight management.

## 2010-05-07 NOTE — Assessment & Plan Note (Signed)
BP elevated today, but review of recent office visits shows BP's of 130/64 and 130/62. Will continue to monitor without med changes today.
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
## 2010-05-16 ENCOUNTER — Other Ambulatory Visit: Payer: Self-pay | Admitting: Cardiovascular Disease

## 2010-05-22 NOTE — Op Note (Signed)
NAMEMarland Kitchen  [name], [name] NO.:  [id number]   MEDICAL RECORD NO.:  [id number]          PATIENT TYPE:  INP   LOCATION:  2852                         FACILITY:  [facility]   PHYSICIAN:  [name]. Burt Knack, MD  DATE OF BIRTH:  June 20, 1930   DATE OF PROCEDURE:  05/09/2006  DATE OF DISCHARGE:                               OPERATIVE REPORT   PROCEDURE:  Distal aortogram and right femoral angiogram with runoff to  the foot via contralateral approach.   SURGEON:  [name]. Burt Knack, MD   INDICATIONS:  Robert Clayton is a 77 year old gentleman who has severe  peripheral arterial disease involving the right lower extremity.  He has  had severe lifestyle-limiting claudication involving the right foot and  calf and has developed resting symptoms.  He has had ABIs suggestive of  critical disease with an ABI of 0.2 on the right.  His noninvasive study  demonstrated on total SFA occlusion with reconstitution of flow below  the knee.  He was therefore scheduled for a peripheral angiogram.   Risks and indications of the procedure were explained to the patient.  The left groin was prepped and draped under normal sterile conditions.  Using a modified Seldinger technique, a 5-French sheath was placed in  the left femoral artery.  A LIMA catheter was used to crossover, but the  iliacs were very tortuous.  I elected to take a distal aortogram as a  guide to cross over to the right side; this was performed through LIMA  catheter.  Following that, I elected to use a Glidewire to cross over  and the LIMA catheter was exchanged out for a straight end-hole  catheter.  An angiogram with runoff of the right leg was performed  through the end-hole catheter.  At the conclusion of the procedure, the  sheath was pulled and manual pressure used for hemostasis.  There were  no immediate complications.   FINDINGS:  The distal aorta is normal in caliber and has no significant  disease.  The bilateral iliac  arteries are tortuous, but widely patent.  The right common femoral artery is widely patent.  The right SFA  proximally is widely patent.  In the midportion of the SFA, there is  severe disease followed by subtotal occlusion.  In the distal portion of  the SFA, the SFA then reconstitutes and the popliteal artery is occluded  at the level of the knee.  There is a fairly long occlusion and then  reconstitution in the peroneal artery via collateral vessels.  There is  no significant reconstitution of either the anterior or posterior tibial  arteries.  There is single-vessel runoff to the foot.  The peroneal  artery which supplied single-vessel runoff appears to have diffuse  nonobstructive disease throughout.   The profunda femoris artery is patent on the right side.   ASSESSMENT:  1. Severe peripheral arterial disease.  2. Subtotal occlusion of the right superficial femoral artery.  3. Occlusion of the right popliteal artery with reconstitution at the      level of the peroneal artery with single-vessel runoff to  the foot.   PLAN:  I really do not think Robert Clayton has any reasonable percutaneous  option at revascularization.  He has a severely reduced ABI and  significant resting symptoms and I think he has threatened toes on the  right foot.  I am going to schedule him for a peripheral vascular  consult for anticipation of peripheral bypass surgery.  He has been  scheduled to see Dr. Scot Dock.      [name]. Burt Knack, MD  Electronically Signed     MDC/MEDQ  D:  05/09/2006  T:  05/09/2006  Job:  [id number]   cc:   Shaune Pascal. Bensimhon, MD  Nelda Severe. Juventino Slovak, M.D.  Judeth Cornfield. Scot Dock, M.D.
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
## 2010-05-22 NOTE — Assessment & Plan Note (Signed)
[place]                            CARDIOLOGY OFFICE NOTE   NAME:Robert Clayton                         MRN:          [id number]  DATE:05/27/2007                            DOB:          09/25/1930    Robert Clayton is a 77 year old gentleman presenting for followup today at  the [facility] cardiology office.  He has both coronary and peripheral  arterial disease.  His claudication symptoms are stable at this time.  He has had a good response to aspirin, Plavix,  and Pletal and has pain  in his right calf with  walking.  He does not have any rest pain or pain  in the leg at night.   [name] describes chest pain over the last several days.  He has had  daily episodes of left-sided chest pressure radiating down the left arm.  He has taken at least one nitroglycerin daily for the last 1 week.  He  describes the pains as relatively mild and dull in nature.  Prior to  this, he was using nitroglycerin infrequently.  He denies dyspnea,  diaphoresis, nausea, vomiting or other associated symptoms.  His chest  pain has resolved quickly after taking nitroglycerin and has had a  fairly predictable response to that.   CURRENT MEDICATIONS:  1. Glyburide/ metformin 2.5/500 mg, 2 twice daily.  2. Lisinopril.  3. HCTZ 20/12.5 mg daily.  4. Allopurinol 300 mg daily.  5. Metoprolol 50 mg b.i.d.  6. Cilostazol 100 mg b.i.d.  7. Plavix 75 mg daily.  8. Simvastatin 20 mg at bedtime.  9. Aspirin 81 mg daily.  10.Lantus insulin 35 units at bedtime.   ALLERGIES:  No known drug allergies.   MEDICAL HISTORY REVIEWED INCLUDES:  1. CAD status post PTCA and stenting of the OM branch of the left      circumflex.  Most recent catheterization in 2007 showed diffuse      small vessel disease with restenosis in the obtuse marginal stent.      The patient has been treated with medical therapy.  2. Type 2 diabetes, insulin-requiring.  3. Chronic kidney disease with baseline  creatinine approximately 1.5.  4. Essential hypertension.  5. Chronic thrombocytopenia.  6. Peripheral arterial disease with an ABI less than 0.4 on the right.   PHYSICAL EXAM:  The patient is alert and oriented.  He is a very  pleasant elderly gentleman in no acute distress.  Weight is 215, blood pressure 140/80, heart rate 72, respiratory rate  16.  HEENT:  Normal.  NECK:  Normal carotid upstrokes without bruits.  Jugular venous pressure  is normal.  No thyromegaly or thyroid nodules.  LUNGS:  Clear bilaterally.  HEART:  Regular rate and rhythm without murmurs or gallops.  The apex is  discrete and nondisplaced.  There is no right ventricular heave or lift.  ABDOMEN:  Soft, nontender,  no bruits.  No organomegaly.  EXTREMITIES:  Femoral pulses are 2+ without bruits.  There is no  clubbing, cyanosis or edema.  Pedal pulses are 1+ on the left and not  palpable  on the right.  SKIN:  Warm and dry without rash.  There are no areas of skin breakdown.  NEUROLOGIC:  Cranial nerves II-XII are intact.  Strength is 5/5 and  equal in the arms and legs.   EKG shows sinus rhythm with an early repolarization pattern.  This is  unchanged from previous.   ASSESSMENT:  [name] is a 77 year old gentleman with coronary and  peripheral arterial disease presenting with crescendo angina.  He has  had an increase in his nitroglycerin use over the last 1 week.  I am  concerned about his symptoms and I think we should proceed with cardiac  catheterization.  His last heart cath was 2 years ago but he has had a  clear change in his anginal symptoms within the last week.  I have  scheduled him for a cardiac catheterization in the outpatient cath lab  first thing in the morning.  If he has any change in his anginal pattern  or chest pain that is unresponsive to nitroglycerin, he will contact EMS  immediately.  He should continue on aspirin and Plavix without changes.  His labs have been drawn to make sure  that his kidney function and  platelet counts are stable.  1. Peripheral arterial disease:  Stable symptoms.  Continue medical      therapy in follow-up with Dr. Scot Dock.  2. Type 2 diabetes:  I will hold metformin this evening as well as the      next 2 days following his catheterization.  3. Hypertension:  Blood pressure borderline.  Continue current therapy      for now.   For further plans, we will await cath results tomorrow as outlined  above.     [name]. Burt Knack, MD  Electronically Signed    MDC/MedQ  DD: 05/27/2007  DT: 05/27/2007  Job #: [id number]   cc:   Nelda Severe. Juventino Slovak, M.D.

## 2010-05-22 NOTE — Progress Notes (Signed)
[facility]                        PERIPHERAL VASCULAR OFFICE NOTE   NAME:MILLERRead, Scharr                         MRN:          [id number]  DATE:05/26/2006                            DOB:          1930/05/01    HISTORY OF PRESENT ILLNESS:  Masaharu Wolbert was seen in followup at the  [facility] peripheral vascular office on May 26, 2006.  He is a 77 year old  gentleman with peripheral arterial disease involving the right leg.  His  ABI's are suggestive of severe disease on the right side at 0.2.  He  underwent noninvasive studies that showed total superficial femoral  artery occlusion and subsequently underwent a peripheral angiogram which  demonstrated subtotal occlusion of the right superficial femoral artery  followed by occlusion of the right popliteal artery with reconstitution  at the level of the peroneal artery with single vessel run-off to the  foot.  Mr. Greger has lifestyle limiting claudication.  He has not had  any ulcers or nonhealing wounds on the feet.   His peripheral angiogram was performed on May 09, 2006 and he was  referred to see Dr. Scot Dock with vascular surgery following the  angiogram.  Mr. Maruska tells me that Dr. Scot Dock plans on following him  clinically and continuing with medical therapy for that at the present  time.  At today's office visit Mr. Lamon is doing well.  He has not had  any recent leg pain and overall he is feeling better.   CURRENT MEDICATIONS:  1. Plavix 75 mg daily.  2. Aspirin 325 mg daily.  3. Allopurinol 300 mg daily.  4. Glyburide/metformin 2.5/500 mg two twice daily.  5. Lopressor 50 mg twice daily.  6. Nitroglycerin patch 0.4 mg daily.  7. Lisinopril/HCT 20/12.5 mg daily.  8. Zocor 20 mg daily.  9. Insulin 19 units daily.   ALLERGIES:  No known drug allergies.   PHYSICAL EXAMINATION:  GENERAL APPEARANCE:  The patient is alert and  oriented.  He is in no acute distress.  VITAL SIGNS:  Weight 209  pounds.  Blood pressure is 122/72 in the right  arm and 128/68 in the left arm.  Heart rate is 64.  Respiratory rate is  12.  EXTREMITIES:  Focused exam of the leg shows a well-healed  catheterization site in the left groin with no hematoma, ecchymoses or  bruit.  Femoral pulses are 2+ bilaterally.  Pedal pulses are not  palpable in the right foot.  There is some discoloration of the toes on  the right foot but no evidence of necrosis or ulceration.   ASSESSMENT:  Mr. Raysor is currently stable from the standpoint of his  peripheral arterial disease.  He should remain on antiplatelet therapy  with aspirin and clopidogrel as well as aggressive medical therapy with  use of a Statin and ACE inhibitor.  We will plan on following him  clinically for now.  He was encouraged to continue walking as much as  possible and also advised regarding foot care.  He asked me today if I  would see him for his cardiac problems as  well because he would like to  minimize his office visits to [place].  I will be in contact with Dr.  Haroldine Laws to make sure that is okay with him.   I will plan on seeing Mr. Hildebran back in six months for followup with  ankle brachial indices and Duplex study at that time.     [name]. Burt Knack, MD  Electronically Signed    MDC/MedQ  DD: 05/26/2006  DT: 05/26/2006  Job #: [id number]
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
## 2010-05-22 NOTE — Assessment & Plan Note (Signed)
[place] OFFICE NOTE   NAME:MILLEREdahi, Kirtz                         MRN:          [id number]  DATE:12/01/2006                            DOB:          1930-10-07    Mr. Seacat returns for followup at the [facility] office on  December 01, 2006.  He is a 77 year old gentleman and coronary and  peripheral arterial disease.  He has severe PAD involving the right leg  and he has been followed by Dr. Scot Dock for this.  He is currently not  experiencing significant rest pain or signs of critical limb ischemia  and therefore, a conservative approach has been utilized.  Mr. Yohman  has been doing well from a symptomatic standpoint.  He tells me that his  walking distance has been better.  He has been able to shop and do other  activities without much pain in his leg.  He has no left leg pain.  He  denies chest pain, dyspnea, edema, orthopnea, or PND.   CURRENT MEDICATIONS:  1. Plavix 75 mg daily.  2. Aspirin 325 mg daily.  3. Allopurinol 300 mg daily.  4. Glyburide/Metformin 2.5/500 mg two b.i.d.  5. Lopressor 50 mg twice daily.  6. Lisinopril/HCT 20/12.5 mg daily.  7. Zocor 20 mg daily.  8. Lantus insulin.  9. Cilostazol 100 mg daily.   ALLERGIES:  NKDA.   PHYSICAL EXAMINATION:  GENERAL:  The patient is alert and oriented, no  acute distress.  VITAL SIGNS:  Blood pressure is 130/70, heart rate 75, respiratory rate  16.  HEENT:  Normal.  NECK:  Normal carotid upstrokes with soft bilateral bruits.  Jugular  venous pressure is normal.  LUNGS:  Clear to auscultation bilaterally.  HEART:  Regular rate and rhythm without murmurs or gallops.  ABDOMEN:  Soft, nontender.  No abdominal bruits.  No organomegaly.  EXTREMITIES:  No clubbing, cyanosis, or edema.  Pedal pulses are  diminished.   EKG demonstrates normal sinus rhythm with early repolarization and a  nonspecific T wave abnormality.   ASSESSMENT:  1. Coronary artery disease.  Mr. Yinger has undergone coronary      stenting with drug-eluting stents back in 2006.  He remains on dual      antiplatelet therapy.  He is tolerating this well and I have asked      him to reduce his aspirin dose to 81 mg.  He will continue on      clopidogrel.  The reduction in the aspirin dose was done in order      to reduce long term bleeding complications with dual antiplatelet      therapy.  For secondary risk reduction, please see below.  2. Peripheral arterial disease.  We will continue with conservative      management with the use of cilostazol and aggressive secondary risk      reduction.  He currently does not have evidence of critical limb      ischemia and he is also followed by Dr. Scot Dock.  3. Hypertension.  Blood pressure is under  excellent control on his      current medical regimen.  4. Dyslipidemia.  He is on low dose simvastatin.  He will be due for      lipids in February of next year.  His last lipid check showed a      total cholesterol of 126 with an HDL of 31 and an LDL of 68.   FOLLOWUP:  I would like to see Mr. Hijazi back in 6 months.     [name]. Burt Knack, MD  Electronically Signed    MDC/MedQ  DD: 12/01/2006  DT: 12/01/2006  Job #: [id number]   cc:   Nelda Severe. Juventino Slovak, M.D.  Judeth Cornfield. Scot Dock, M.D.

## 2010-05-22 NOTE — Assessment & Plan Note (Signed)
[place]                         GASTROENTEROLOGY OFFICE NOTE   NAME:MILLERNaetochukwu, Mastandrea                         MRN:          [id number]  DATE:03/18/2007                            DOB:          1930-04-09    REASON FOR CONSULTATION:  Colorectal cancer screening.   Mr. Brogden is a pleasant 77 year old African-American male, referred  through the courtesy of Dr. Juventino Slovak for a screening colonoscopy.  Except  for occasional constipation, he has no GI complaints.  Specifically, he  is without change in bowel habits, abdominal pain, melena or  hematochezia.   PAST MEDICAL HISTORY:  Pertinent for coronary artery disease.  He is  status post coronary artery stenting.  He has peripheral arterial  disease, hypertension, and a history of prostate cancer that was treated  with implantable seeds.  He has diabetes and gout.   FAMILY HISTORY:  Noncontributory.   MEDICATIONS:  1. Glyburide/metformin.  2. Lisinopril/hydrochlorothiazide.  3. Allopurinol.  4. Metoprolol.  5. Cilostazol.  6. Plavix.  7. Simvastatin.  8. Insulin.  9. Baby aspirin.   He has no allergies.   FAMILY HISTORY:  Noncontributory.   He neither smokes nor drinks.  He is married and retired.   REVIEW OF SYSTEMS:  Negative.   PHYSICAL EXAMINATION:  Pulse 84, blood pressure 140/78, weight 217.  HEENT: EOMI.  PERRLA.  Sclerae are anicteric.  Conjunctivae are pink.  NECK:  Supple without thyromegaly, adenopathy or carotid bruits.  CHEST:  Clear to auscultation and percussion without adventitious  sounds.  CARDIAC:  Regular rhythm; normal S1 S2.  There are no murmurs, gallops  or rubs.  ABDOMEN:  Bowel sounds are normoactive.  Abdomen is soft, nontender and  nondistended.  There are no abdominal masses, tenderness, splenic  enlargement or hepatomegaly.  EXTREMITIES:  Full range of motion.  No cyanosis, clubbing or edema.  RECTAL:  Deferred.   IMPRESSION:  1. Functional constipation.  2. Coronary artery disease, on Plavix.  3. Hypertension.  4. Diabetes.   RECOMMENDATION:  Screening colonoscopy.     Sandy Salaam. Deatra Ina, MD,FACG  Electronically Signed    RDK/MedQ  DD: 03/18/2007  DT: 03/18/2007  Job #: [id number]   cc:   Nelda Severe. Juventino Slovak, M.D.  Burt Knack, MD

## 2010-05-22 NOTE — Assessment & Plan Note (Signed)
[place]                            CARDIOLOGY OFFICE NOTE   NAME:Robert Clayton, Robert Clayton                         MRN:          [id number]  DATE:09/11/2007                            DOB:          [birth year]    REASON FOR EVALUATION:  Followup CAD.   HISTORY OF PRESENT ILLNESS:  Robert Clayton is a 77 year old gentleman with  coronary and peripheral arterial disease.  He was last seen in May when  he described increasing exertional chest pain.  His symptoms were fairly  classic and elected to perform diagnostic catheterization.  He was found  to have distal small-vessel coronary artery disease with a widely patent  left circumflex stent.  He had severe in-stent restenosis and a very  small OM branch.  I elected to treat him medically.  He has done very  well with medical therapy.  He denies recurrent chest pain or dyspnea.  His claudication symptoms are stable and he is able to walk without much  limitation at present.  He denies orthopnea, PND, edema,  lightheadedness, or syncope.   CURRENT MEDICATIONS:  1. Glyburide and metformin 2.5/500 mg 2 b.i.d.  2. Lisinopril HCT 20/12.5 mg daily.  3. Allopurinol 300 mg daily.  4. Metoprolol 50 mg twice daily.  5. Cilostazol 100 mg b.i.d.  6. Plavix 75 mg daily.  7. Simvastatin 20 mg at bedtime.  8. Aspirin 81 mg daily.  9. Lantus 35 units at bedtime.  10.Imdur is currently on hold per the patient.   ALLERGIES:  NKDA.   PHYSICAL EXAMINATION:  GENERAL:  The patient is alert and oriented.  He  is in no acute distress.  VITAL SIGNS:  Weights is 214 pounds, blood pressure 124/70, heart rate  68, and respiratory rate 12.  HEENT:  Normal.  NECK:  Normal carotid upstrokes without bruits.  JVP normal.  LUNGS:  Clear bilaterally.  HEART:  Regular rate and rhythm.  No murmurs or gallops.  ABDOMEN:  Soft and nontender.  No organomegaly.  No abdominal bruits.  EXTREMITIES:  There is trace pretibial edema bilaterally.   Feet are warm  and appear well perfused.   EKG shows sinus rhythm with early repolarization, otherwise within  normal limits.   ASSESSMENT:  1. Coronary artery disease.  The patient is doing well on medical      therapy.  Continue current regimen without changes.  2. Peripheral arterial disease.  The patient continues to follow up      with Dr. Scot Dock.  He is managed medically because of poor distal      targets and no signs or symptoms of critical limb ischemia.  3. Hypertension, blood pressures is control optimal on his current      regimen.  4. Dyslipidemia.  He is overdue for lipids.  We will check a lipid and      liver panel next week.  He remains on simvastatin 20 mg.   For followup, I will see Robert Clayton in 6 months.     [name]. Burt Knack, MD  Electronically Signed    MDC/MedQ  DD: 09/11/2007  DT: 09/12/2007  Job #: [id number]   cc:   Nelda Severe. Juventino Slovak, M.D.
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
## 2010-05-22 NOTE — Assessment & Plan Note (Signed)
[name] is back regarding his traumatic brain injury.  He is doing  quite well after being discharged on April 22, 2008.  He has finished  his therapy.  He feels that his cognition is back to baseline.  He had  occasional headache, but this is improved.  He developed some neck pain  after vigorous stressing and bearing down to have a bowel movement and  strained his neck apparently.  It sounds as if this is improving.  He  has been staying on top of his bowels in regard to constipation since  then.  Denies any dizziness except for occasional lightheadedness when  he gets up too fast.  He has had no falls or problems.  His appetite has  been good.  He sees Dr. Vertell Limber at the end of the month.  He does report  some chest pain relieved with an Imdur.  He was given a prescription by  Dr. Ronnald Ramp and has not seen Dr. Burt Knack back in followup.   REVIEW OF SYSTEMS:  Notable for the above.  Full 14-point review is in  the written health and history section of the chart.   SOCIAL HISTORY:  The patient is married and is with his wife today.   PHYSICAL EXAMINATION:  VITAL SIGNS:  Blood pressure is 150/78, pulse is  56, respiratory rate 18.  He is sating 98% on room air.  GENERAL:  The patient is pleasant, alert, and oriented x3.  Affect is  bright and appropriate.  NEUROLOGIC:  Gait is generally stable.  He is able to shift and move  away without any difficulties today.  Strength is near 5/5 in both  extremities with good fine motor coordination.  He has intact visual  fields and cranial nerve exam is unremarkable today.  Insight awareness.  Affect and memory all were appropriate.  He is able to quickly add  numbers and perform algebraic equations.  HEART:  Regular rate.  CHEST:  Clear.  ABDOMEN:  Soft and nontender.   ASSESSMENT:  1. Status post traumatic brain injury.  2. Right cervical strain.   PLAN:  1. The patient was given permission to drive today.  He seems to be      near his  baseline.  2. Recommended some gentle stretching exercises and heat to the neck.      This seems to have improved for the most part.  3. We will wean off Neurontin over the next week.  4. Continue Keppra for now.  I expect duration of 3-6 months of use      considering his complicated history.  5. Recommended resuming Imdur for angina.  He needs to speak with Dr.      Ronnald Ramp and Dr. Burt Knack regarding      further plan and workup there.  6. I will see him back in about 3 months.      Meredith Staggers, M.D.  Electronically Signed     ZTS/MedQ  D:  05/25/2008 13:53:38  T:  05/26/2008 02:34:00  Job #:  [id number]   cc:   Marchia Meiers. Vertell Limber, M.D.  Fax: [place], [place]  [address] [address]   Michael D. Burt Knack, [place]  [place], [place] [address]
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
## 2010-05-22 NOTE — Cardiovascular Report (Signed)
NAME:  QUINT, MAGA NO.:  [id number]   MEDICAL RECORD NO.:  [id number]          PATIENT TYPE:  OIB   LOCATION:  1961                         FACILITY:  [facility]   PHYSICIAN:  [name]. Burt Knack, MD  DATE OF BIRTH:  1930-07-04   DATE OF PROCEDURE:  DATE OF DISCHARGE:                            CARDIAC CATHETERIZATION   PROCEDURE:  Left heart catheterization, selective coronary angiography.   INDICATIONS:  Mr. Ferraiolo is a very nice 77 year old gentleman who I have  followed for coronary and peripheral arterial disease.  He has had  previous stenting of the left circumflex.  His last catheterization in  2007 demonstrated a small vessel coronary artery disease.  Mr. Zisk  has been stable from a cardiac standpoint, but at his office visit  yesterday complained of increased angina.  He has actually had daily  angina for the last week and has been taking nitroglycerin on a daily  basis.  Prior to the last 1 week, he has only been taking approximately  2 nitroglycerin per month.  With his dramatic change in angina pattern,  I elected to proceed directly with cardiac catheterization in this  gentleman with known CAD.   Risks and indications of the procedure were reviewed with the patient.  Informed consent was obtained. The right groin was prepped and draped,  anesthetized 1% lidocaine using modified Seldinger technique. A 4-French  sheath was placed in the right femoral artery. Standard 4-French Judkins  catheter was used for coronary angiography and a pigtail catheter was  used to cross the aortic valve and measure left ventricular pressure.  Left ventriculogram was deferred because of mild renal insufficiency in  the presence of diabetes.  A pullback across the aortic valve was done.  A total of 60 mL of contrast was used.  The patient tolerated the  procedure well.  All catheter exchanges were performed over guidewire.  There were no immediate complications.   FINDINGS:  Aortic pressure 127/67 with a mean 92, left ventricular  pressure 126/16.   Coronary angiography, left mainstem:  The left mainstem is widely  patent.  It trifurcates into an LAD intermediate branch and left  circumflex.  There is no significant stenosis throughout.   LAD:  The LAD is a moderate-sized vessel that courses down and reaches  the LV apex.  There is mild plaque in the proximal portion of the LAD  near the ostium with 30% stenosis.  After the first septal perforator,  there is a second 30% stenosis.  The midportion of the LAD has a smooth  50% stenosis and the distal LAD beyond the distal diagonal branch has a  70% smooth stenosis.  The LAD stenoses are all stable from the previous  catheterization 2 years ago.   Ramus intermedius is a moderate-sized vessel.  It is widely patent  throughout with no significant stenosis.   Left circumflex:  The left circumflex has a 30% proximal stenosis.  Circumflex courses down and just after the first OM branch, there is a  widely patent stent in the mid circumflex.  Beyond the stented segment,  there is a 40% stenosis leading into a large left posterolateral branch.  There is a stented segment of the first OM that has severe stenosis.  This is a very small vessel of approximately 1 mm in diameter.  This  stenosis is in the range of 80%.   Right coronary artery:  The right coronary artery is a moderate-sized  vessel.  It is widely patent throughout the proximal, mid, and distal  portions.  There is very minor luminal irregularity throughout.  Distally, the right coronary artery bifurcates into a PDA branch and  posterior AV segment.  The PDA branch has severe stenosis in the distal  aspect with serial 80% lesions.  The vessel is very small through this  region and it is located in the distal portion.   ASSESSMENT:  1. Distal small vessel coronary artery disease affecting the left      anterior descending and right coronary  artery.  2. Widely patent left circumflex stent.  3. Severe in-stent restenosis of the first obtuse marginal branch of      the left circumflex (small vessel).   PLAN:  Mr. Exline only significant stenoses involve the distal  arteries and they are really not favorable for PCI.  I think we should  continue with medical therapy and I am going to add Imdur to his medical  regimen with hopes of reducing his angina.  If he has refractory angina,  I will consider PCI, but I think long-term durability will be very  limited based on the small vessel size and distal lesion location.      [name]. Burt Knack, MD  Electronically Signed     MDC/MEDQ  D:  05/28/2007  T:  05/28/2007  Job:  [id number]   cc:   Nelda Severe. Juventino Slovak, M.D.
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
## 2010-05-22 NOTE — Op Note (Signed)
NAME:  Robert Clayton, Robert Clayton NO.:  [id number]   MEDICAL RECORD NO.:  [id number]          PATIENT TYPE:  AMB   LOCATION:  [facility]                         FACILITY:  [facility]   PHYSICIAN:  Lillette Boxer. Dahlstedt, M.D.DATE OF BIRTH:  [birth year]   DATE OF PROCEDURE:  10/19/2007  DATE OF DISCHARGE:                               OPERATIVE REPORT   PREOPERATIVE DIAGNOSIS:  Right distal ureteral stone.   POSTOPERATIVE DIAGNOSIS:  Right distal ureteral stone.   PROCEDURES:  1. Cystourethroscopy.  2. Right retrograde pyelogram.  3. Intraoperative fluoroscopy with interpretation.  4. Right semi-rigid ureteroscopy with laser lithotripsy and stone      manipulation.   ATTENDING PHYSICIAN:  Dr. Franchot Gallo   RESIDENT PHYSICIAN:  Dr. Lorenza Cambridge   ANESTHESIA:  General anesthesia.   INDICATIONS FOR PROCEDURE:  Mr. Karnitz is a 77 year old white male past  medical history positive for nephrolithiasis.  He has had prior  ureteroscopy.  He was diagnosed with a right ureteral stone and ESWL was  attempted, but was unsuccessful in fragmenting the stone.  Likewise, Dr.  Diona Fanti recommended endoscopic treatment for this.  Preoperatively all  risks, benefits, consequences and concerns were discussed and informed  consent was obtained.   PROCEDURE IN DETAIL:  The patient is brought to the operating room,  placed in supine position.  He was correctly identified by his  wristband, appropriate time-out was taken.  IV antibiotics were  administered, general anesthesia was delivered.  Once adequately  anesthetized, he was placed in dorsal lithotomy position with great care  taken to minimize risk of peripheral neuropathy or compartment syndrome.  His perineum was prepped and draped sterilely.  We began our procedure  by performing rigid cystourethroscopy.  The 22-French rigid cystoscopic  sheath, 12 degrees lens, normal saline as our irrigant.  His urethra was  normal in course and  caliber.  He does have bilobar benign prostatic  hypertrophy, but does not appear to be obstructed.  Upon entering the  bladder clear urine was identified.  No stones were seen in the bladder,  no urothelial abnormalities were noted.  He did have some very slight  trabeculation to his bladder.  Both ureteral orifices were noted to be  in their normal anatomic position, effluxing clear urine.   We gently cannulated the right ureteral orifice with a 5-French open-  ended catheter and performed right retrograde pyelogram.  This  demonstrated the fixed filling defect in the distal right ureter  consistent with the stone.  Cephalad to this the ureter was dilated.  We  then advanced a sensor tip guidewire through the open-ended catheter up  to the level of the right renal pelvis.  We then removed the open-ended  catheter and the cystoscope.  We then used a 7.5 French semi-rigid  ureteroscope, gently cannulated the right distal ureter and then  advanced it cephalad until the stone was encountered.  We then used a  360 nanometers laser fiber with the initial settings that 0.5 joules and  5 Hz.  We systematically fragmented the stone into dust.  We then were  left with the back wall of the stone which was fragmented into two  larger pieces using with settings of 0.8 joules and 8 Hz.  We then  removed the laser fiber advanced our nitinol basket and then  individually retrieved the two larger stone fragments depositing them in  the bladder.  We then advanced the semi-rigid ureteroscope up to the  level of the ureter just cephalad to the iliac vessels.  Because of the  angulation of his ureter and the poor visibility we elected not to move  any more further retrograde.   We then slowly advanced the ureteroscope in an antegrade fashion.  No  stone fragments were left in the ureter.  No perforation was seen.  No  ureteral injury was appreciated.  Because of this we elected not to  leave a stent.  The  ureteroscope was removed, the cystoscope was  replaced back into the bladder.  The bladder was drained and stone  fragments were flushed out as well.  This marked the end of our  procedure.  He tolerated the procedure well.  There were no  complications.  Dr. Diona Fanti was present and participated in all  aspects of the case      Aron Baba, MD      Lillette Boxer. Dahlstedt, M.D.  Electronically Signed    JR/MEDQ  D:  10/19/2007  T:  10/19/2007  Job:  [id number]

## 2010-05-22 NOTE — Letter (Signed)
March 18, 2007    Nelda Severe. Juventino Slovak, M.D.  [address], [place], [place] [address]   RE:  Robert Clayton, Robert Clayton  MRN:  [id number]  /  DOB:  [birth year]   Dear Dr. Juventino Slovak:   Upon your kind referral, I had the pleasure of evaluating your patient  and I am pleased to offer my findings.  I saw Robert Clayton in the office  today.  Enclosed is a copy of my progress note that details my findings  and recommendations.   Thank you for the opportunity to participate in your patient's care.    Sincerely,      Sandy Salaam. Deatra Ina, MD,FACG  Electronically Signed    RDK/MedQ  DD: 03/18/2007  DT: 03/18/2007  Job #: [id number]
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
## 2010-05-22 NOTE — Assessment & Plan Note (Signed)
OFFICE VISIT   Robert Clayton, Robert Clayton  DOB:  1930-05-12                                       08/26/2006  [id number]   I saw the patient in the office today for continued followup of his  peripheral vascular disease. I had recently seen him in consultation on  May 21, 2006. He had undergone an arteriogram by Dr.  Burt Knack which  showed he had significant infrainguinal arterial occlusive disease on  the right with single vessel run off via diseased perineal artery. At  that time, his symptoms were tolerable and he did not have any non-  healing wounds and therefore he elected nonoperative treatment. He was  recently seen by Dr.  Janus Molder for an ingrown toenail who sent him back  for continued vascular followup given the absent pulses in his feet.   Since I saw him last, his rest pain has remained stable, although  interestingly he tells me that his pain improves when he elevates his  legs. He has had no history of non-healing wounds. He does have stable  calf claudication bilaterally.   REVIEW OF SYSTEMS:  He has had no recent chest pain, chest pressure,  palpitations or arrhythmias. He has had no bronchitis, asthma or  wheezing.   PHYSICAL EXAMINATION:  Blood pressure 141/80, heart rate 85. I do not  detect any carotid bruits. LUNGS:  Clear bilaterally to auscultation.  CARDIAC: He has a regular rate and rhythm. He has palpable femoral  pulses and a palpable left popliteal pulse. I cannot palpate a right  popliteal pulse or pedal pulses on either side. He does have monophasic  flow in the right posterior tibial, dorsalis pedis and perineal position  on the right with monophasic flow on the left also. ABI on the right is  29% and on the left 77%. He has no ischemic ulcerations on the right  foot.   He was not a candidate for endovascular revascularization and his only  option for revascularization would be a fem perineal bypass into a  moderately diseased  perineal artery. He also has some underlying chronic  venous insufficiency and this is not an ideal situation. For this  reason, I would not recommend revascularization unless he developed a  non-healing wound or progressive rest pain. Currently, he feels that his  symptoms are quite tolerable and currently he has no open wounds on his  foot.   I plan on seeing him back in six months. He knows to call sooner if he  has problems.   Judeth Cornfield. Scot Dock, M.D.  Electronically Signed   CSD/MEDQ  D:  08/26/2006  T:  08/27/2006  Job:  [id number]   cc:   [name]. Burt Knack, MD  Jory Ee. [place], [place].M.

## 2010-05-22 NOTE — Assessment & Plan Note (Signed)
Robert Clayton is back regarding his traumatic brain injury.  He is doing  quite well at this point.  He denies any headaches, aches, or pains.  I  talked to him about his Keppra today and he states he is no longer  taking it.  There is some confusion as to when he actually stopped it.  As of our last visit in May 19, he made it clear to me that he was still  taking it.  He has had no seizure activity.  He has been driving without  any issues.  He has had no dizziness, problems with falls, etc.  He has  to make an appointment with Dr. Ronnald Ramp next month for basic followup.   REVIEW OF SYSTEMS:  Notable for the above.  Full 14-point review is in  the written health and history section of the chart.   SOCIAL HISTORY:  Unchanged.   PHYSICAL EXAMINATION:  VITAL SIGNS:  Blood pressure is 137/70, pulse is  76, respiratory rate 18.  He is sating 99% on room air.  GENERAL:  The patient is pleasant, alert, and oriented x3.  Affect is  bright and appropriate.  GENERAL:  He is alert, appropriate.  He has good insight, awareness, and  memory.  Balance is excellent.  Fine finger-to-nose coordination.  HEART:  Regular rate.  CHEST:  Clear.  ABDOMEN:  Soft, nontender.   ASSESSMENT:  1. Traumatic brain injury with incredible improvement.  2. History of right cervical strain, which is resolved.  3. History of headaches, which have also resolved.   PLAN:  1. I wrote him a wean for his Keppra in case he is in fact taking it.      The patient did not bring his medications today to confirm either      way.  If he is not taking it, there is no indication to resume at      this point.  2. Follow up with Dr. Ronnald Ramp and Dr. Burt Clayton as indicated in the future.      I will see him back on an as needed basis.      Robert Clayton, M.D.  Electronically Signed     ZTS/MedQ  D:  08/17/2008 12:05:43  T:  08/17/2008 23:13:11  Job #:  [id number]   cc:   Robert Calico, MD  [place] [address] [place]  [id number]   Robert Clayton, [address]  [place], [place] [address]

## 2010-05-25 NOTE — Assessment & Plan Note (Signed)
[facility]                              CARDIOLOGY OFFICE NOTE   NAME:MILLERJaiel, Chan                         MRN:          [id number]  DATE:09/10/2005                            DOB:          1930-08-12    PRIMARY CARE PHYSICIAN:  Nelda Severe. Juventino Slovak, M.D.   PATIENT IDENTIFICATION:  Robert Clayton is a delightful, 77 year old male, who  returns today for routine followup here.   PLAN:  1. Coronary artery disease.      a.     Status post PTA stenting of the OM1 and distal circumflex with       Taxus drug eluting stent in February 2006.      b.     Admission for chest pain in February 2007.  Cardiac       catheterization:  EF 65-70%, essentially nonobstructive coronary       artery disease with some significant branch vessel disease and a 60%       restenosis of the OM2 stent.  Medical therapy recommended.      c.     Myoview, October 2006:  EF 63%, no ischemia.  2. Diabetes.  3. Chronic renal insufficiency, baseline creatinine about 1.5.  4. Hypertension.  5. Gout.  6. Thrombocytopenia.  7. 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   CURRENT MEDICATIONS:  1. Plavix 75.  2. Aspirin 325.  3. Zocor 20.  4. Allopurinol.  5. Glyburide/metformin 2.5/500 2 tablets twice daily.  6. Lopressor 50 twice daily.  7. Doxycycline twice daily.   ALLERGIES:  No known drug allergies.   INTERVAL HISTORY:  Mr. Timmers returns today for routine followup.  He says  overall he is doing quite well.  He is not overly active, but does get  around in his yard without any difficulty.  He continues to have very  transient episodes of chest pains, just a few seconds, which go away without  any intervention.  These are not exertional.  Denies any palpitations.  He  denied any heart failure.   PHYSICAL EXAMINATION:  GENERAL:  He is well-appearing, no acute distress.  Ambulates around the clinic slowly without any respiratory difficulty.  VITAL SIGNS:  Blood pressure is  118/68, heart rate 75, his weight is 210.  HEENT:  Sclerae anicteric.  EOMI.  There are no xanthelasmas.  Mucous  membranes are moist.  NECK:  The neck is supple, no JVD.  Carotids are 2+ bilaterally without  bruits.  No lymphadenopathy or thyromegaly.  CARDIAC:  Regular rate and rhythm, no murmurs, rubs or gallops.  LUNGS:  Clear.  ABDOMEN:  Soft, nontender, nondistended.  No hepatosplenomegaly, no bruits,  no masses, good bowel sounds.  EXTREMITIES:  Warm with no cyanosis, clubbing or edema.  Good distal pulses.  NEUROLOGIC:  Cranial nerves II through XII are intact.  Moves all four  extremities without difficulty.  Affect is bright.   EKG shows normal sinus rhythm at a rate of 75 with minimal lateral ST  elevation, probably early repolarization.   LDL in February of 2007 was 72.  ASSESSMENT AND PLAN:  1. Coronary artery disease.  This is quite stable.  I do not think his      chest pain is ischemic in nature.  However, he was curious to see if      nitroglycerin would help and we will start him on a nitroglycerin patch      0.4 mg an hour.  Also increase his Lopressor to 75 twice daily.  We did      talk about possibly repeating a stress test, but he would like to defer      at this time.  We will consider it on his next visit.  We need to be      very aggressive with his risk factor management.  2. Hypertension.  Blood pressure is well-controlled.  3. Hyperlipidemia.  We will check fasting lipids this week.  I will be      very aggressive and titrate his LDL below 70.   DISPOSITION:  Return to clinic in six months for routine followup.                                Shaune Pascal. Bensimhon, MD    DRB/MedQ  DD:  09/10/2005  DT:  09/10/2005  Job #:  [id number]   cc:   Nelda Severe. Juventino Slovak, M.D.

## 2010-05-25 NOTE — Assessment & Plan Note (Signed)
[facility]                            CARDIOLOGY OFFICE NOTE   NAME:MILLERAyson, Blahnik                         MRN:          [id number]  DATE:01/22/2006                            DOB:          [birth year]    PRIMARY CARE PHYSICIAN:  Dr. Ihor Gully.   CARDIOLOGIST:  Dr. Glori Bickers.   HISTORY OF PRESENT ILLNESS:  Mr. Malhi is a very pleasant 77 year old  male patient followed by Dr. Haroldine Laws with a history of coronary artery  disease status post stenting to the obtuse marginal #1 and distal  circumflex with Taxus drug-eluting stents in February of 2006. He had  60% restenosis of the OM 2 stent at catheterization in February 2007.  This was treated medically. He has continued to have chronic chest pain  since that time. He comes into the office today with complaints of  bilateral leg pain. About 4 days ago he noticed pain in his right leg  that seemed to radiate up his body to his head. He had some pain on the  left side as well. He then developed a headache that lasted several  hours. He took Tylenol and other analgesics without relief. He finally  took a nitroglycerin, and it seemed to go away. As noted above he has  chronic chest pain. This is left-sided. It is usually at rest. Exertion  does not make it worse. He was just trying anything to make his headache  go away and nitroglycerin seemed to work. He has not had any headaches  since that time. He denies any symptoms consistent with amaurosis fugax  or TIAs. He has however, noted some mild pain especially in his right  calf since that time. There are times where he feels as though his leg  pain is improved by movement. He feels it is worse at bedtime. He feels  that things are crawling on his skin. He also notes that there are times  where if he exerts himself his pain in his legs does get worse. As noted  above it is in his calves and the right side seems to be worse. He  denies any lesions  on his feet or legs.   CURRENT MEDICATIONS:  1. Plavix 75 mg daily.  2. Aspirin 325 mg daily.  3. Zocor 20 mg q.h.s.  4. Allopurinol 300 mg daily.  5. Glyburide/metformin 2.5/500 mg 2 tablets b.i.d.  6. Lopressor 50 mg twice daily.  7. Nitroglycerin patch 0.4 mg per hour daily.   ALLERGIES:  No known drug allergies.   SOCIAL HISTORY:  The patient denies any tobacco abuse.   REVIEW OF SYSTEMS:  Please see HPI. Denies any fevers, chills, cough,  hemoptysis. No hematochezia, hematuria, dysuria. The rest of review of  systems are negative.   PHYSICAL EXAMINATION:  He is a well-nourished, well-developed, man in no  distress. Blood pressure 130/90, pulse 59, weight 210 pounds.  HEENT: Unremarkable.  NECK: Without JVD, carotids without bruits bilaterally.  CARDIAC EXAM: Reveals S1, S2, regular rate and rhythm, without murmurs.  LUNGS: Clear to auscultation bilaterally, without wheezing, rhonchi,  or  rales.  ABDOMEN: Soft, nontender, with normoactive bowel sounds, questionable  faint bruit midline and towards the right. No organomegaly.  EXTREMITIES: Without edema. Calves are soft, nontender.  SKIN: Warm and dry.  No lesions.  VASC:  Femoral artery pulses are 2+ bilaterally.  There are no bruits.  Popliteal pulses are difficult to palpate and probably rated at 1+. His  dorsalis pedis and posterior tibialis pulses are trace on the right at  best and trace to 1+ on the left. Capillary refill is about 1 second  bilaterally.   Electrocardiogram reveals sinus rhythm with a heart rate of 89, no acute  changes.   IMPRESSION:  1. Bilateral leg pain.  2. Abdominal bruits.  3. Chronic chest pain - no changes.  4. Coronary artery disease      a.     Status post stenting to the obtuse marginal 1 and distal       circumflex with a Taxus drug-eluting stent in February 2006.      b.     A 60% restenosis of the obtuse marginal 2 stent at       catheterization February 2007-medical therapy.   5. Good left ventricular function.  6. Diabetes mellitus.  7. History of chronic renal insufficiency.  8. Hypertension.  9. Gout.  10.Thrombocytopenia.  11.History of laryngeal mass followed by Dr. Ernesto Rutherford.   PLAN:  The patient presents to the office today with complaints of leg  pain. I am unsure what occurred 3 to 4 nights ago when he had pain that  shot up his body and he developed a headache. His headaches have  resolved. He denies any changes in his vision, denies facial drooping,  difficulties with speech, or unilateral weakness. He continues to have  transient chest pain. This is atypical and is unchanged over the last  couple of years. He is concerned about his leg pain. Initially it  sounded as though his leg pain was probably consistent with restless leg  syndrome. However, he is at risk for peripheral arterial disease and he  does note that sometimes his leg pain is worse when he exerts himself.  This is worse on the right and I have difficulty palpating his pulses on  the right. He also has an abdominal bruit. Therefore, I have set him up  for an abdominal ultrasound to rule out abdominal aortic aneurysm as  well as renal artery stenosis. We will also get him set up for ABIs and  arterial Dopplers. We will check a CMET and CBC today and have him  follow up with Dr. Haroldine Laws in the next 2 to 3 weeks. He can certainly  contact us back if he has worsening pain in his legs. If his ABIs are  unrevealing,  then we will have him follow up with his primary care physician for  probable restless leg syndrome.      Richardson Dopp, PA-C  Electronically Signed      Loretha Brasil. Lia Foyer, MD, [facility] [place]  Electronically Signed   SW/MedQ  DD: 01/22/2006  DT: 01/22/2006  Job #: [id number]   cc:   Nelda Severe. Juventino Slovak, M.D.

## 2010-05-25 NOTE — Assessment & Plan Note (Signed)
[facility]                            CARDIOLOGY OFFICE NOTE   NAME:MILLERLakeisha, Robert Clayton                         MRN:          [id number]  DATE:02/12/2006                            DOB:          [birth year]    PRIMARY CARE PHYSICIAN:  Dr. Massie Maroon.   PATIENT IDENTIFICATION:  Robert Clayton is a delightful 77 year old male who  returns today for a routine followup.   PROBLEM LIST:  1. Coronary artery disease.      a.     Status post PTCA and stenting of the OM1, and distal       circumflex with taxis drug-eluting stents, February 2006.      b.     Admission for chest pain February 2007, cardiac       catheterization showed an ejection fraction of 65% to 70% with       nonobstructive coronary artery disease with some branch vessel       disease and a 60% restenosis of the OM2 stent, medical therapy       recommenced.      c.     On Myoview October 2006, ejection fraction 63% no ischemia.  2. Diabetes.  3. Chronic renal insufficiency based on creatinine about 1.5.  4. Hypertension.  5. Gout.  6. Thrombocytopenia.  7. 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  8. Peripheral arterial disease.      a.     ABIs February 03, 2006 showed a 0.90 on the left and 0.40 on       the right.      b.     Abdominal ultrasound showed no evidence of aneurysm.   CURRENT MEDICATIONS:  1. Plavix 75 a day.  2. Aspirin 325  a day.  3. Zocor 20 a day.  4. Allopurinol 300 a day.  5. Glyburide/metformin 2.5/500, two tablets b.i.d.  6. Lopressor 50 b.i.d.  7. Nitro patch 0.4 mg per hour.   INTERVAL HISTORY:  Robert Clayton returns today for routine followup. He saw  Richardson Dopp in our office 2 or 3 weeks ago for a pain that started in  his right leg and radiated up to his chest. This was fairly transient.  It was not thought to be ishemic in nature. However, he did notice deep  diminished pulses on right leg as well as an abdominal bruits. He was  sent for ABIs and abdominal  ultrasound. Abdominal ultrasound showed  aneurysm. ABIs showed severe peripheral disease on the right with an ABI  of 0.40. He does note that he has claudication fairly severely and this  limits his activity though he seems to down play it. He denies any  recurrent chest pain, no shortness of breath. He has not had any  ulceration on the foot.   PHYSICAL EXAMINATION:  He is an elderly male, who ambulates around the  clinic slowly without any respiratory difficulty. Blood pressure is  154/76, his heart rate is 79, his weight is 214.  HEENT: Sclera anicteric, EOMI, there is no xanthelasmas, mucous  membranes are moist, oropharynx  is clear.  NECK: Supple, I do not hear any carotid bruits, there are 2+  bilaterally. There is no lymphadenopathy, or thyromegaly.  CARDIAC: He has regular rate and rhythm with an S4, no murmur.  LUNGS: Clear.  ABDOMEN: Soft, nontender, nondistended, no hepatosplenomegaly, no  bruits, no masses appreciated, good bowel sounds.  EXTREMITIES: Warm, there is no cyanosis, clubbing, or edema and there  are no palpable pulses in the right foot. One + on the left. There is no  skin breakdown or ulceration.  NEURO: He is alert and oriented x3, cranial nerves II-XII are intact,  moves all 4 extremities without difficulty.   EKG, shows normal sinus rhythm with sinus arhythmia at a rate of 79. He  has minimal ST elevation in multiple leads which is unchanged from  previous. And mild T wave flattening.   ASSESSMENT/PLAN:  1. Coronary artery disease, this is stable without any evidence of      ischemia. He is on a good medical regimen will continue.  2. Hypertension, blood pressure remains elevated, we will restart his      lisinopril in the form of lisinopril 20, hydrochlorothiazide/12.5.      Check a BMET 1 week.  3. Hyperlipidemia, we will check repeat lipids next week given his      coronary disease and peripheral arterial disease we need to be very      aggressive  and get his LDL under 70.  4. Peripheral arterial disease, with limiting claudication. We will      have him follow up in the [facility] clinic in the very near future.     Shaune Pascal. Bensimhon, MD  Electronically Signed    DRB/MedQ  DD: 02/12/2006  DT: 02/12/2006  Job #: [id number]   cc:   Nelda Severe. Juventino Slovak, M.D.

## 2010-05-25 NOTE — Cardiovascular Report (Signed)
NAMEMarland Kitchen  FERLANDO, FARRAN NO.:  [id number]   MEDICAL RECORD NO.:  [id number]          PATIENT TYPE:  INP   LOCATION:  3742                         FACILITY:  [facility]   PHYSICIAN:  Robert Clayton, M.D. LHCDATE OF BIRTH:  [birth year]   DATE OF PROCEDURE:  03/02/2004  DATE OF DISCHARGE:                              CARDIAC CATHETERIZATION   REFERRING PHYSICIAN:  Vickki Clayton, M.D.   PRIMARY CARDIOLOGIST:  Robert Clayton, M.D.   PROCEDURES PERFORMED:  1.  Selective coronary angiography.  2.  Left heart catheterization.  3.  Left ventriculogram.   CARDIOLOGIST:  Robert Clayton, M.D.   PATIENT IDENTIFICATION:  [name] is a 77 year old male with a history of  diabetes, but no known coronary disease who was referred for catheterization  secondary to a two-week history of chest pain with increasing dyspnea on  exertion.  He was referred for a cath to evaluate his coronary anatomy.   DESCRIPTION OF THE PROCEDURE:  The risks and benefits of the procedure were  reviewed with [name] and consent was signed, and placed on the chart.  The right groin are was then prepped and draped in a routine sterile  fashion.  The area was then anesthetized with 1% local lidocaine.  A 6  French arterial sheath was placed in the right femoral artery using the  modified Seldinger technique.  Standard catheters including a JL-4, JR-4 and  a 6 French bent pigtail were used for the procedure.  All catheter exchanges  were made over a wire.   There were no apparent complications.   At the end of the procedure [name] was transferred to the holding area  in stable condition for removal of his arterial sheath.   FINDINGS:   HEMODYNAMIC DATA:  Central aortic pressure was 149/89 with a mean of 116.  LV pressure was 142/2 with an LVEDP of 17.  There was no gradient on the  aortic valve pullback.   CORONARY ANATOMY:  Left Main:  The left main was a very long vessel with  minor luminal  irregularities.   LAD:  The LAD is a large vessel that wrapped the apex and gave off two tiny  diagonals.  There was a 25% ostial lesion, a 30% proximal lesion, a 50%  long mid lesion and a 60% distal lesion.  There was diffuse distal disease  at apex.   Left Circumflex:  The left circumflex is a large vessel and gave off a large  branching ramus and a large branching OM-1.  There was a tiny AV groove left  circumflex.  In the ramus there is an 80-90% lesion in the smaller branch of  the ramus.  This is approximately a 1.5 mm vessel.  In the OM-1 there is an  80-90 lesion proximally in the upper branch of the OM-1.  This was a 1.5-2.0  mm vessel.  In the lower branch of the OM-1 there were 70-80% tandem lesions  in the lower branch with diffuse distal disease.   RCA:  The RCA is a dominant vessel.  There was a 30%  mid lesion.  There was  a PDA, which had a 50% mid lesion and severe diffuse disease with multiple  90-95% lesions in the distal PDA.   VENTRICULOGRAPHIC DATA:  Left ventriculogram showed an EF of  55% with no  mitral regurgitation.   ASSESSMENT AND PLAN:  1.  High grade branch disease in the left circumflex; and, both the obtuse      marginal and the ramus with diffuse distal disease.  2.  Diffuse distal disease in the right coronary artery and left anterior      descending consistent with his diabetic vasculopathy.  3.  Normal left ventricular function.   Plan:  We will review the films with the interventional team to see if the OM-1 and  ramus lesions are amenable to percutaneous intervention; however, given  there small caliber I suspect medical therapy will be the most feasible  option at this point.      DB/MEDQ  D:  03/02/2004  T:  03/04/2004  Job:  [id number]   cc:   Robert Clayton, M.D.  [address] [place], [place] [address]  Fax: [phone]   Robert Clayton, M.D.

## 2010-05-25 NOTE — Op Note (Signed)
[place]  Patient:    Robert Clayton, Robert Clayton                         MRN: [id number] Proc. Date: 01/11/00 Adm. Date:  [id number] Attending:  Lamona Curl CC:         Truddie Crumble, M.D.  Nelda Severe Juventino Slovak, M.D.   Operative Report  PREOPERATIVE DIAGNOSIS:  Adenocarcinoma of the prostate, clinical stage T1C, Gleason score 6/10 (3+3+).  POSTOPERATIVE DIAGNOSIS:  Adenocarcinoma of the prostate, clinical stage T1C, Gleason score 6/10 (3+3).  PROCEDURE PERFORMED:  Placement of I125 seeds within prostate and cystoscopy.  SURGEON:  Lillette Boxer. Dahlstedt, M.D.  ASSISTANT:  Truddie Crumble, M.D.  BRIEF HISTORY:  This middle aged male was diagnosed with adenocarcinoma of the prostate, following evaluation for an elevated PSA.  He was found to have a Gleason 6/10 adenocarcinoma, upon transrectal ultrasound and needle biopsy with the left biopsy showing 50% of tissue being involved with the Gleason score 6/10.  The patient has been counseled extensively by both myself and Dr. Danny Lawless regarding treatment options.  He has chosen to have brachytherapy. Risks and complications have been discussed with and are known by this patient.  He desires to proceed.  DESCRIPTION OF PROCEDURE:  The patient was administered preoperative IV antibiotics and taken to the operating room where general endotracheal anesthesia was administered.  He was placed in the dorsolithotomy position. The genitalia and perineum were prepped and draped, a Foley catheter was inserted.  The ultrasound probe was placed in his rectum and full visualization of the prostate was afforded.  The template was placed against the patients perineum and anchoring needles were placed.  Following this, a total of 23 needles were used to delivery 87 seeds at previously demarcated positions.  For symmetry, please see the provided records.  Following placement of all 23 needles and 87 seeds,  the catheter was removed and flexible cystoscopy failed to show any seeds within the urinary tract.  A Foley catheter was replaced, hooked to dependent drainage, and the patient was awakened, extubated, and taken to the PACU in stable condition. DD:  01/11/00 TD:  01/11/00 Job: [id number]
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
## 2010-05-25 NOTE — Progress Notes (Signed)
[place]                        PERIPHERAL VASCULAR OFFICE NOTE   NAME:MILLERZaequan, Harton                         MRN:          [id number]  DATE:04/30/2006                            DOB:          02-26-1930    Inetta Fermo returns for followup at the [facility] on April 30, 2006, as an outpatient.  Mr. Gaby is a 77 year old  gentleman, who has right-calf claudication with minimal activity.  He  was initially seen here on February 22 for that problem.  He also has  type 2 diabetes and chronic renal disease and coronary artery disease.  He has had a noninvasive lower extremity arterial duplex performed,  which demonstrated an ABI of 0.4 on the right and the ultrasound portion  of the procedure demonstrated SFA occlusion in the distal third of the  SFA and popliteal with trickle flow in the distal popliteal and tibial  peroneal trunk.  His symptoms have progressed and he has occasional rest  pain now.  He underwent a repeat ABI study this morning that  demonstrated an ABI of 0.2 on the right and stable ABI of 0.6 on the  left.   Mr. Colberg has no other new complaints today.  He continues to have  occasional chest pain, but no other complaints.  He was tried on Pletal  after his initial visit, but was intolerant to this medicine, as it  caused chest pain.   CURRENT MEDICATIONS INCLUDE:  1. Plavix 75 mg daily.  2. Aspirin 325 mg daily.  3. Allopurinol 300 mg daily.  4. Glyburide/metformin 2.5/500 mg two b.i.d.  5. Lopressor 50 mg twice daily.  6. Nitro patch 0.4 mg daily.  7. Lisinopril/HCT 20/12.5 mg daily.  8. Zocor 20 mg daily.   PHYSICAL EXAM:  The patient is alert and oriented.  He is in no acute  distress.  His weight is 209 pounds.  Blood pressure is 112/70 in the  left arm and was 128/64 in the right arm.  Heart rate 67, respiratory  rate 16.  HEENT:  Normal.  NECK:  Jugular venous pressure is normal.  LUNGS:   Clear to auscultation bilaterally.  HEART:  Regular rate and rhythm without murmurs or gallops.  ABDOMEN:  Soft, nontender.  EXTREMITIES:  There is no clubbing, cyanosis or edema.  The distal toes  on the right leg are discolored.  There are no palpable pulses in the  right foot.  There is no evidence of ulceration.  The toes are cool on  both feet, but the remaining portions of the feet are warm to touch.  The left side posterior tibial pulse is 1+.   ASSESSMENT:  Mr. Cost is a 77 year old gentleman with diabetes and  severe peripheral arterial disease.  With his decrease in his ABI,  he  is clearly at risk for tissue loss and has developed rest symptoms.  I  think we should perform a lower extremity angiogram to see if he has any  percutaneous treatment options.  If not, he should be considered for  revascularization, although his risk of  surgical revascularization may  be significant in the setting of his coronary artery disease and  persistent anginal symptoms, although the angina appears mild.  We will  schedule him for an arteriogram to be performed in the peripheral  vascular lab in the next one to two weeks.  I asked him to continue his  aspirin, clopidogrel and other medical therapy for his vascular disease.     [name]. Burt Knack, MD  Electronically Signed    MDC/MedQ  DD: 04/30/2006  DT: 04/30/2006  Job #: [id number]   cc:   Shaune Pascal. Bensimhon, MD  Nelda Severe. Juventino Slovak, M.D.

## 2010-05-25 NOTE — Discharge Summary (Signed)
NAME:  Robert Clayton, Robert Clayton                ACCOUNT NO.:  [id number]   MEDICAL RECORD NO.:  [id number]          PATIENT TYPE:  INP   LOCATION:  4710                         FACILITY:  [facility]   PHYSICIAN:  Marijo Conception. Wall, M.D.   DATE OF BIRTH:  [birth year]   DATE OF ADMISSION:  02/27/2005  DATE OF DISCHARGE:  03/01/2005                                 DISCHARGE SUMMARY   REASON FOR ADMISSION:  Chest discomfort.   DISCHARGE DIAGNOSIS:  1.  Coronary artery disease.      1.  Status post drug eluting stent to the first obtuse marginal and          distal circumflex in February 2006.      2.  Catheterization this admission revealing moderate small vessel          disease - medical therapy planned.      3.  Good left ventricular function.  2.  Laryngeal mass.      1.  Laryngoscopy and biopsy planned this admission postponed due to the          above.  3.  Thrombocytopenia.      1.  Follow up planned with hematology service.  4.  Hypertension.  5.  Diabetes mellitus.      1.  TZD added this admission.  6.  Chronic renal insufficiency.   ALLERGIES:  None.   PROCEDURES PERFORMED:  Cardiac catheterization by Dr. Minus Breeding on  February 28, 2005, please see the dictated not for complete details.  The  LAD showed mid 30% stenosis, distal 60% long, distal apical 70-80%, second  obtuse marginal with proximal stent, diffuse long 60-70% stenosis mid distal  vessel, diffuse disease, third obtuse marginal proximal luminal  irregularities, ramus intermedius with diffuse, severe disease, RCA with  luminal irregularities, proximal PDA with 30% stenosis, circumflex artery,  there was a stent in the mid AV groove which showed some mild luminal  irregularities.   BRIEF HISTORY:  The patient is a 77 year old male patient followed by Dr.  Haroldine Laws who was preparing for laryngoscopy with biopsy by Dr. Ernesto Rutherford  when he developed chest discomfort in the PACU.  We were called to see him.  His Plavix and  aspirin had been stopped five days prior.  The patient had  undergone stress Myoview in October 2006 that showed no ischemia and EF 60%.  He had had no chest discomfort up until the date of his laryngoscopy.   HOSPITAL COURSE:  The patient was interviewed and examined by Dr. Johnsie Cancel.  It was decided to admit him.  We held on restarting his aspirin and Plavix  and put him on a low dose of heparin.  He was set up for cardiac  catheterization and this was done on February 22 by Dr. Percival Spanish as noted  above.  Medical therapy was planned.  His EF was 65-70% at catheterization.  Because of his low platelet count on admission which was 67,000, hematology  was called, the differential diagnosis by Dr. Humphrey Rolls was chronic ITP, MDS  versus medications.  Apparently, plans are for an  outpatient bone marrow  biopsy and the patient has been instructed to call for an appointment.  The  patient's Lopressor was increased for medical therapy of his coronary  disease.  Dr. Ernesto Rutherford saw the patient in follow up and postponed his  procedure and plans to see him back as an outpatient.  The patient's  diabetes was noted to be uncontrolled during his admission and the  hospitalist service was called.  They discussed insulin with the patient but  he would prefer not to start this.  He will be continued on Glyburide twice  daily for a week and then resume his Glucovance.  Dr. Marye Round of the  hospitalist service also placed him on Avandia 4 mg daily.  He is to follow  up with his primary care physician for this.   The patient was stable enough for discharge to home on March 01, 2005.  His right groin was without hematoma or bruits.   FOLLOW UP:  1.  The patient will Dr. Glori Bickers, [facility], on March 16,      at 1:45.  2.  He will see Dr. Ernesto Rutherford February 27 at 2:15.  3.  The patient has been advised to call Dr. Humphrey Rolls at the [facility] for a      follow up appointment for his thrombocytopenia.   4.  The patient has been advised to call Dr. Duanne Limerick office for a follow up      appointment regarding his diabetes.   LABORATORY DATA:  Cardiac enzymes x 3 negative.  At discharge, white count  6600, hemoglobin 12.5, hematocrit 36.4, platelet count 65,000.  INR 1.1.  Fibrinogen 421, which is normal.  D-dimer 0.25, which is normal.  Sodium  141, potassium 3.9, chloride 111, CO2 24, glucose 161, BUN 11, creatinine  1.2, calcium 8.9, LDH 154.  Total bilirubin 0.8l, alkaline phos 64, AST 14,  ALT 13, total protein 3.4, albumin 3.7.  Total cholesterol 137,  triglycerides 144, HDL 36, LDL 72.  Heparin antibody screen negative, panel  pending.  Chest x-ray done prior for his pre-surgical workup showed a  possible mass right upper chest and follow up chest CT showed no right lung  mass, spurring at the right C6 vertebra, benign cavernous hemangioma of the  liver.   DISCHARGE MEDICATIONS:  1.  Glyburide 5 mg b.i.d. for one week, then the patient is to restart his      Glucovance 2.5/500 mg two tablets b.i.d.  2.  Avandia 4 mg daily - new.  3.  Plavix 75 mg daily.  4.  Aspirin 325 mg daily.  5.  Lopressor 50 mg b.i.d. - increased.  6.  Zocor 20 mg q.h.s.  7.  Allopurinol 300 mg daily.  8.  Flomax as taken previously.   DISCHARGE INSTRUCTIONS:  The patient has been asked to restrict his activity  to no driving or lifting for one week.  He is to slowly increase his  activity.  He should call our office for any bleeding, bruising, or fevers.  Less than 30 minutes spent during his discharge.      Richardson Dopp, P.A.      Thomas C. Wall, M.D.  Electronically Signed    SW/MEDQ  D:  03/01/2005  T:  03/02/2005  Job:  [id number]   cc:   Minna Merritts, M.D.  Fax: [phone]   Marcy Panning, MD  Fax: [phone]   Nelda Severe. Juventino Slovak, M.D.  Fax: [phone]   Glori Bickers, M.D. [facility]  [place] [place]  [place] [address]

## 2010-05-25 NOTE — Discharge Summary (Signed)
NAME:  Robert Clayton, OSADA NO.:  [id number]   MEDICAL RECORD NO.:  [id number]          PATIENT TYPE:  INP   LOCATION:  [id number]                         FACILITY:  [place]   PHYSICIAN:  Seger Jani Dictator       DATE OF BIRTH:  1930/04/01   DATE OF ADMISSION:  03/01/2004  DATE OF DISCHARGE:  03/05/2004                                 DISCHARGE SUMMARY   PRIMARY CAREGIVER:  Laird Weinheimer. Juventino Slovak, M.D.   CARDIOLOGIST:  Jenkins Rouge, M.D.   DISCHARGE DIAGNOSES:  1.  Two-week history of increased shortness of breath and chest discomfort.  2.  Electrocardiogram on admission nondiagnostic for acute myocardial      infarction.  3.  Troponin I studies 0.01 x 3.  4.  Thrombocytopenia.  5.  Left heart catheterization on March 02, 2004.  This study showed that      the ramus intermedius had an 80-90% stenosis.  The first obtuse marginal      had an 80-90% proximal stenosis in its upper branch.  There was a 70%      tandem stenosis in the lower branch.  The LAD and right coronary artery      have residual disease, but nonflow limiting.  The distal PDA has a      severe diffuse distal disease about 90-95%.  Ejection fraction 55%.   SECONDARY DIAGNOSES:  1.  Type 2 diabetes.  2.  Hypertension.  3.  Gout.  4.  Benign prostatic hypertrophy.  5.  History of nephrolithiasis.   PROCEDURES:  On March 02, 2004, left heart catheterization by Glori Bickers, M.D.  This study showed that the left main was free of disease.  The left anterior descending had a 25% ostial, 30% proximal, 50% long  midpoint stenosis and a 60% distal stenosis.  The left circumflex gives off  a large branching ramus, which had an 80-90% lesion in a smaller branch.  The first obtuse marginal had an 80-90% proximal stenosis in the upper  branch with a 70% tandem stenosis in the lower branch.  The right coronary  artery had a dominant 30% midpoint stenosis and PDA 50% midpoint severe  diffuse distal disease 90-95%.   The patient will have PCI on March 07, 2004,  by Dr. Olevia Perches.   DISCHARGE DISPOSITION:  Mr. [name] goes home post procedure day #1 after  undergoing left heart catheterization with results as dictated above.  He is  not having any chest discomfort or shortness of breath.  His vital signs are  stable.  The groin is without bruit or swelling.  The patient is discharged  on the following medications.   DISCHARGE MEDICATIONS:  1.  Enteric-coated aspirin 325 mg daily.  2.  Plavix 75 mg daily.  3.  Allopurinol 100 mg daily.  4.  Flomax 0.4 mg daily.  5.  Metoprolol 50 mg one-half tablet in the morning and one-half tablet in      the evening.  6.  Zocor 20 mg daily at bedtime.  7.  Glyburide 2.5 mg twice daily.   He is  to hold metformin until after his procedure on March 07, 2004.   ACTIVITY:  He is to avoid heavy straining or lifting for the next two weeks.   DISCHARGE DIET:  A low-sodium, low-cholesterol, diabetic diet.   WOUND CARE:  He may shower.  Call [phone] if he experiences pain or  swelling at the catheterization site.   FOLLOWUP:  He is to return to [facility] on Wednesday, March 07, 2004, at 7 a.m. for intervention.   BRIEF HISTORY:  The patient is a 77 year old male who gives a two-week  history of increased dyspnea with some chest soreness.  On the morning of  March 01, 2004, he saw Dr. Juventino Slovak and was sent to the emergency room.  Electrocardiogram shows nonspecific ST changes.  Troponin I study was  negative.  The patient has a history of diabetes, hypertension, gout and  thrombocytopenia.  The patient is scheduled for left heart catheterization.  The risks and benefits were discussed with his wife.   HOSPITAL COURSE:  The patient presented on March 01, 2004, with a two-  week history of dyspnea on exertion and chest tightness.  The patient was  scheduled for a heart catheterization study.  As dictated above, this showed  disease in the first obtuse  marginal and the ramus intermedius, which will  be addressed with PCI on March 07, 2004, by Dr. Olevia Perches.   LABORATORY DATA:  The complete blood count on March 03, 2004:  Hemoglobin  12.3, hematocrit 35, white cells 6.1, platelets 100.  Platelets had been a  little low this hospitalization.  Serum electrolytes on March 02, 2003:  Sodium 141, potassium 4, chloride 110, carbonate 23, BUN 17, creatinine 1.3  glucose 157.  Troponin I studies less than 0.01, then 0.01 and  then 0.01.      GM/MEDQ  D:  03/03/2004  T:  03/05/2004  Job:  [id number]

## 2010-05-25 NOTE — Cardiovascular Report (Signed)
[name], Robert Clayton NO.:  [id number]   MEDICAL RECORD NO.:  [id number]          PATIENT TYPE:  INP   LOCATION:  4710                         FACILITY:  [facility]   PHYSICIAN:  Minus Breeding, M.D.   DATE OF BIRTH:  [birth year]   DATE OF PROCEDURE:  02/28/2005  DATE OF DISCHARGE:                              CARDIAC CATHETERIZATION   PRIMARY CARE PHYSICIAN:  Dr. Nelda Severe. [facility].   PROCEDURE:  Left heart catheterization/coronary arteriography.   INDICATION:  Evaluate patient with chest pain, some resting and some with  exertion.  He had previous stenting to the circumflex marginal and the  circumflex in the mid AV groove.   PROCEDURAL NOTE:  Left heart catheterization was performed via the right  femoral artery.  The artery was cannulated using an anterior wall puncture.  A #6-French arterial sheath was inserted via the modified Seldinger  technique.  A preformed Judkins and a pigtail catheter were utilized. The  patient tolerated the procedure well and left the lab in stable condition.   RESULTS:   HEMODYNAMICS:  LV 128/13, Ao 135/69.   CORONARIES:  The left main was long and normal.   The LAD had proximal luminal irregularities.  There was mid 30% followed by  30% stenosis.  There was distal 60% long stenosis.  There was distal and  apical 70% to 80% diffuse stenosis.  There was a small mid and distal  diagonal which were normal.   The circumflex in the AV groove had proximal luminal irregularities.  There  was a mid stent with mild in-stent luminal irregularities.  The first obtuse  marginal was small and normal.  The second obtuse marginal was moderate size  in length.  It had a proximal stent with diffuse long 60% to 70% stenosis  within the stent.  The mid and distal vessel was quite narrow after the  stent with diffuse moderate disease.  The OM-3 had proximal luminal  irregularities.  There was distal moderate diffuse disease.   There was a large  ramus intermediate which was branching.  A small superior  branch had diffuse severe disease.  A large inferior branch had diffuse  luminal irregularities.   The right coronary artery was a dominant vessel.  There were proximal mid-  luminal irregularities.  The PDA was a moderate-size vessel.  There was  proximal 30% stenosis followed by diffuse distal severe disease.   LEFT VENTRICULOGRAM:  A left ventriculogram was obtained in the RAO  projection.  The EF was 65% to 70% with normal wall motion.   CONCLUSION:  Small vessel diffuse disease including restenosis in a marginal  stent.  There are no focal high-grade lesions in large vessels.  The  ejection fraction is normal to hyperdynamic.   PLAN:  Given the fact that his disease is diffuse and in small or distal  segments, I would manage this medically.  I would not suspect he would  benefit from any attempt at revascularization.  He should have medical  therapy including beta blockade.  ______________________________  Minus Breeding, M.D.     JH/MEDQ  D:  02/28/2005  T:  03/01/2005  Job:  [id number]   cc:   Nelda Severe. Juventino Slovak, M.D.  Fax: [phone]   Glori Bickers, M.D. [place]  [place] [address]

## 2010-05-25 NOTE — H&P (Signed)
NAME:  SHERIDAN, RITCHISON NO.:  [id number]   MEDICAL RECORD NO.:  [id number]          PATIENT TYPE:  EMS   LOCATION:  MAJO                         FACILITY:  [facility]   PHYSICIAN:  Satira Sark, M.D. LHCDATE OF BIRTH:  29-Dec-1930   DATE OF ADMISSION:  03/01/2004  DATE OF DISCHARGE:                                HISTORY & PHYSICAL   PRIMARY CARE PHYSICIAN:  Babak Lack. Kindl, M.D.   CHIEF COMPLAINT:  Shortness of breath and chest soreness.   HISTORY OF PRESENT ILLNESS:  Mr. Freire is a pleasant 77 year old male with  a reported history of type 2 diabetes mellitus, hypertension managed by  diet, gouty arthritis, and previous nephrolithiasis.  He has no documented  history of coronary artery disease or myocardial infarction.  He states that  over the last two weeks has had progressive problems with increasing  shortness of breath with activity, walking 20 yards to his mailbox makes him  severely short of breath causing him to stop which has been a new problem  over the last few weeks.  He developed a soreness in his chest with  activity.  He went for a routine visit with Dr. Juventino Slovak today and mentioned  his symptoms and was subsequently referred to the emergency department for  further assessment.  At present, the patient is denying any active symptoms.  His electrocardiogram shows sinus rhythm at 94 beats per minute with  nonspecific STT wave changes.  He did have some recent blood work per Dr.  Juventino Slovak which is outlined below.  He has not had any previous ischemic testing  as best I can tell.   ALLERGIES:  No known drug allergies.   MEDICATIONS AT HOME:  1.  Glucovance 2.5/500 mg two tablets p.o. b.i.d.  2.  Allopurinol 100 mg p.o. daily.  3.  Enteric coated aspirin 325 mg p.o. daily.  4.  Flomax 0.4 mg p.o. daily.  5.  He also received a single dose of Toprol XL 50 mg p.o. at Dr. Duanne Limerick      office.   SOCIAL HISTORY:  Patient is married.  He is retired from  [employer] most  recently, having worked there for 15 years.  He has no previous tobacco use  history.  He denies alcohol use.   FAMILY HISTORY:  Significant for type 2 diabetes mellitus.   REVIEW OF SYMPTOMS:  As described in history of present illness.  He has had  some headaches.  He has also been bothered by urinary frequency in the  evening, precipitating therapy with Flomax.  He has had no obvious hematuria  or problems with known nephrolithiasis.  He has had no problems with  peripheral edema or claudication.   PAST MEDICAL HISTORY:  1.  Type 2 diabetes mellitus.  2.  Apparent history of hypertension.  He has been watching his salt but      has not been on any prescription medications.  3.  Dyslipidemia based on recent laboratory data showing an LDL cholesterol      of 114.  4.  History of nephrolithiasis status  post previous surgery approximately 31      years ago.  He has not been bothered by this since that time.   PHYSICAL EXAMINATION:  GENERAL APPEARANCE:  This is a pleasant elderly male,  seated in no acute distress.  VITAL SIGNS:  Temperature 98.8 degrees.  Heart rate 98 and sinus rhythm,  respirations 20, blood pressure 178/99, oxygen saturation 98% on room air.  HEENT:  Conjunctivae and lids normal.  Oropharynx is clear.  NECK:  Supple without elevated jugular venous pressure  or loud carotid  bruits.  No thyromegaly is noted.  LUNGS:  Clear with nonlabored breathing at rest.  CARDIOVASCULAR:  Regular rate and rhythm with soft systolic murmur heard at  the base.  No S3 gallop and no pericardial rub.  ABDOMEN:  Soft and nontender with normoactive bowel sounds.  EXTREMITIES:  No pitting edema.  Peripheral pulses are 1 to 2+.  SKIN:  No ulcer changes are noted.  There are multiple nevi noted on the  patient's back.  MUSCULOSKELETAL:  No kyphosis noted.  NEUROPSYCHIATRIC:  Patient is alert and oriented x3.   LABORATORY DATA:  Per Dr. Juventino Slovak, wbc 7.8, hemoglobin 14.1,  hematocrit 42.5,  platelets 95 (previously 82 back in January of this year).  Sodium 144,  potassium 4.6, glucose 126, BUN 18, creatinine 1.4.  LDL cholesterol 114 in  January of this year with HDL cholesterol of 37, triglycerides 168 and total  cholesterol of 185.  Urinalysis from January was normal.  PSA was 0.15.  Hemoglobin A1c was 7.4%.   IMPRESSION:  1.  A two week history of progressive shortness of breath with activity and      recent chest soreness concerning for possible unstable angina in a 77-      year-old male with apparent hypertension, type 2 diabetes mellitus and      dyslipidemia.  Electrocardiogram is nonspecific at this point.  Cardiac      markers are pending.  The patient is symptom-free at rest at this time.  2.  Type 2 diabetes mellitus, managed with Glucovance.  3.  Hypertension, presently not on antihypertensive medications.  4.  Dyslipidemia with an LDL cholesterol of 114, presently not on statin      therapy.  5.  Apparent history of urinary frequency and possible benign prostatic      hypertrophy, treated with Flomax.  6.  History of nephrolithiasis, status post surgery 31 years ago with normal      urinalysis in January and no complaints of flank pain or hematuria.  7.  Thrombocytopenia with a platelet count of 95.  The patient had no      spontaneous bleeding.   PLAN:  1.  Patient will be admitted to telemetry.  We will cycle cardiac markers      and at this point, continue home medications with the exception of the      Glucophage portion of Glucovance and also place the patient on Lopressor      and Lovenox.  Blood pressure will be followed with appropriate      medication titration.  2.  Will also start Zocor 20 mg p.o. q.h.s.  3.  Follow-up chest x-ray and repeat baseline blood work including platelet      count.  Coagulation studies are also      needed. 4.  Anticipate diagnostic coronary angiography tomorrow.  I have reviewed      the risks and  potential benefits with the patient and  his wife and they      agree to proceed.  Further plans to follow.      SGM/MEDQ  D:  03/01/2004  T:  03/01/2004  Job:  [id number]   cc:   Nelda Severe. Juventino Slovak, M.D.  [address], [place]  [place]  [place] [address]  Fax: [phone]

## 2010-05-25 NOTE — Cardiovascular Report (Signed)
NAME:  Robert Clayton, Robert Clayton NO.:  [id number]   MEDICAL RECORD NO.:  [id number]          PATIENT TYPE:  OIB   LOCATION:  2899                         FACILITY:  [facility]   PHYSICIAN:  Eustace Quail, M.D. [facility] DATE OF BIRTH:  09-17-1930   DATE OF PROCEDURE:  03/07/2004  DATE OF DISCHARGE:                              CARDIAC CATHETERIZATION   CLINICAL HISTORY:  Robert Clayton is 77 years old and has diabetes, but no prior  history of heart disease.  He recently developed symptoms of exertional  dyspnea and was admitted to the hospital last week and underwent  catheterization by Dr. Pierre Bali.  This showed an 80% lesion in the  distal circumflex and a 90% lesion in the marginal branch.  There was also  moderate disease in the LAD and right coronary artery which was felt to be  nonobstructive.  We brought him back today for intervention.   PROCEDURE:  The procedure was performed via the right femoral artery using  an arterial sheath and a CLS4 6-French guiding catheter.  The patient was  given Angiomax bolus and infusion and had been given 600 mg of Plavix  earlier this morning.   We navigated a Prowater wire down the marginal branch without too much  difficulty.  There were sharp bends from the left main into the circumflex  and then in the proximal circumflex artery.  We first pre dilated with a  2.25 x 15 mm Maverick performing two inflations up to 8 atmospheres for 30  seconds.  We then deployed a 2.5 x 60 mg TAXUS stent deploying this with one  inflation of 10 atmospheres for 30 seconds.  This resulted in some slow flow  down the vessel and we gave intracoronary verapamil and nitroglycerin.  We  then post dilated with a 2.5 x 12 mm Quantum Maverick performing two  inflations up to 15 atmospheres for 30 seconds.  We left the wire in place  in the marginal branch and passed the second wire down the AV circumflex  into the distal artery past a long lesion in the distal  circumflex artery.  We used two wires so that one artery could act as a buddy wire to help with  passing our balloon to the stents to the distal vessel.  We first pre  dilated with a 2.5 x 20 mm Maverick performing two inflations up to 8  atmospheres for 30 seconds.  We then deployed a 2.75 x 28 mm TAXUS stent.  We positioned the proximal edge of the stent just distal to the takeoff of  the marginal branch which we had just stented.  We deployed the TAXUS stent  with one inflation of 12 atmospheres for 30 seconds.  We then post dilated  with a 3.0 x 20 mm Maverick performing two inflations up to 15 atmospheres  for 30 seconds.  Repeat diagnostic studies were then performed through the  guiding catheter.   The right femoral artery was closed with AngioSeal at the end of the  procedure.  Patient tolerated the procedure well and left the laboratory  in  satisfactory condition.   RESULTS:  Initially the stenosis in the marginal branch was estimated at  90%.  Following stenting this improved to 0%.  There was about 40% ostial  narrowing in the marginal branch residual.   Stenting of the distal circumflex artery resulted in improvement from 80% to  0%.   CONCLUSION:  1.  Successful PCI of the marginal branch of the circumflex artery using a      TAXUS drug-eluting stent with improvement of center of narrowing from      90% to 0%.  2.  Successful PCI of the distal circumflex artery using a TAXUS drug-      eluting stent with improvement of center of narrowing from 80% to 0%.   DISPOSITION:  Patient will return to postanesthesia care unit for further  observation.  Patient should remain on Plavix for one year.      BB/MEDQ  D:  03/07/2004  T:  03/07/2004  Job:  [id number]   cc:   Nelda Severe. Juventino Slovak, M.D.  [address] [place]  [place] [address]  Fax: [phone]   Jenkins Rouge, M.D.   Glori Bickers, M.D. [facility]   [facility]

## 2010-05-25 NOTE — Letter (Signed)
February 28, 2006    Shaune Pascal. [address], [address], [place] [address]   RE:  COHEN, NOVACEK  MRN:  [id number]  /  DOB:  [birth year]   Dear Linna Hoff,   Thanks for allowing me to see Robert Clayton in consultation for his  peripheral arterial disease today.  He was seen as an outpatient on  February 28, 2006, in the office.  As you know, Robert Clayton is a  delightful 77 year old gentleman, who has developed lifestyle-limiting  claudication involving the right calf.  He reports pain in his right  calf with short-distance walking within 50 feet.  His pain resolves at  rest.  He has no thigh or hip pain in the right leg.  He has no  significant left-leg symptoms.  He complains of some constant pain in  the right toes, but has had this for some time.  His claudication  symptoms accelerated approximately one month ago.  He has no history of  nonhealing wounds on the feet.  He has no history of stroke or TIA.   CURRENT MEDICATIONS INCLUDE:  1. Plavix 75 mg daily.  2. Aspirin 325 mg daily.  3. Zocor 20 mg at bedtime.  4. Allopurinol 300 mg daily.  5. Glyburide/Metformin twice daily.  6. Lopressor 50 mg twice daily.  7. Nitroglycerin patch.  8. Lisinopril/HCT 20/12.5 mg daily.  9. Zocor 40 mg daily.   ALLERGIES:  NKDA.   PAST MEDICAL HISTORY INCLUDES THE FOLLOWING:  1. Coronary artery disease, treated with percutaneous coronary      intervention in 2006.  Residual branch vessel disease noted.  2. Type 2 diabetes.  3. Chronic renal insufficiency.  4. Hypertension.  5. Gout.  6. Thrombocytopenia.   SOCIAL HISTORY:  The patient is retired from [employer].  He worked there for  many years.  He does not smoke cigarettes or drink alcohol.   FAMILY HISTORY:  Negative for peripheral arterial disease.   REVIEW OF SYSTEMS:  A complete 12-point review of systems was performed.  Pertinent positives included occasional chest pain.  All other systems  were reviewed and are negative,  except as described above.   PHYSICAL EXAMINATION:  The patient is alert and oriented.  He is in no  acute distress.  His weight is 212 pounds, blood pressure is 159/87,  heart rate is 87, respiratory rate is 12.  HEENT:  Normal.  NECK:  Normal carotid upstrokes, without bruits.  Jugular venous  pressure is normal.  No thyromegaly or thyroid nodules.  LUNGS:  Clear to auscultation bilaterally.  HEART:  Regular rate and rhythm without murmurs or gallops.  ABDOMEN:  Soft, nontender, no organomegaly, no abdominal bruits.  BACK:  There is no paraspinal tenderness or flank tenderness.  EXTREMITIES:  There is no clubbing, cyanosis or edema.  Femoral pulses  are 2+ bilaterally, without bruits.  In the right foot, the patient has  no palpable pulses.  His right posterior tibial pulse is obtainable by  Doppler.  On the left side, he has a 1+ left posterior tibial pulse and  a nonpalpable dorsalis pedis pulse that is obtainable by Doppler.  The  feet have no evidence of ulceration.  SKIN:  Warm and dry without rash.   Lower extremity arterial duplex, performed January 28, demonstrated an  ABI of 0.9 on the left and 0.4 on the right.  This demonstrated  occlusion of the mid superficial femoral artery, as well as the right  popliteal artery, with trickle flow in the distal popliteal and tibial  peroneal trunk.  The left side demonstrated mild atherosclerosis without  any focal stenoses.   ASSESSMENT:  Robert Clayton is a 77 year old gentleman with underlying  atherosclerotic coronary disease, as well as peripheral arterial  disease.  He has Fontaine Class 2B claudication symptoms that are  lifestyle-limiting.  Unfortunately, his occluded right superficial  femoral artery extends below the knee.  This clearly limits his  revascularization options, as stenting in the knee joint is not optimal.  It also reduces the patency rates for surgical bypass grafts, if the  distal anastomosis has to occur below  the knee, especially in a  diabetic.  I think our best approach for Robert Clayton would be to try him  on Pletal 100 mg twice daily for the next eight weeks and re-evaluate  his symptoms.  He was encouraged to continue with his walking as much as  possible.  If he still has marked limitation in lifestyle, I would favor  performing an angiogram after his followup visit in order to better  assess his potential revascularization options.   Linna Hoff, thanks again for allowing me to see Robert Clayton.  Please feel free  to call at any time, if questions regarding his care.    Sincerely,      [name]. Burt Knack, MD  Electronically Signed    MDC/MedQ  DD: 02/28/2006  DT: 02/28/2006  Job #: [id number]   CC:    Nelda Severe. Juventino Slovak, M.D.

## 2010-05-25 NOTE — H&P (Signed)
NAME:  Robert Clayton, Robert Clayton NO.:  [id number]   MEDICAL RECORD NO.:  [id number]          PATIENT TYPE:  OIB   LOCATION:  4710                         FACILITY:  [facility]   PHYSICIAN:  Minna Merritts, M.D.   DATE OF BIRTH:  Mar 15, 1930   DATE OF ADMISSION:  02/27/2005  DATE OF DISCHARGE:                                HISTORY & PHYSICAL   This patient is a 77 year old male who has had left laryngeal pain and  swelling.  He has no obvious malignancy in that area, but he did swallow a  fish bone and I feel there may be foreign body still stuck in his lateral  pharyngeal wall.  We also are concerned about the possibility of a small  lesion on that side.  However, the patient has also had a complete workup,  in August 2006, with cardiac cath where he was stated to have unstable  angina, coronary artery disease with status post angioplasty x2 and has done  quite well without any recurrent angina at that time.  He has been on an  excellent medical regimen.  He has done very well on his Plavix.  But more  recently he has had some angina or at least chest pain.  We have also gone  through a workup where we felt he may have had a malignancy of his lung but  CAT scan has ruled that out.  So, now our chest pain status leads [place] back to  the cardiac status.  Even with his recent cardiac evaluation, we are  concerned about putting him to sleep for something as simple as a direct  laryngoscopy which would only demand approximately half an hour of  anesthesia but Dr. Linna Caprice and myself feel that a further cardiac evaluation  would be indicated.   1.  He has had a cardiac cath, in February 2006, and was stated to have      unstable angina with normal left ventricular function but he has      coronary artery disease.  2.  Diabetes.  3.  Chronic renal insufficiency secondary to diabetic neuropathy.      Creatinine is 1.5.  4.  Hypertension.  5.  Gout.  6.  History of kidney stones.  7.   Benign prostatic hypertrophy.   MEDICATIONS:  1.  Glyburide/metformin 2.5/500 p.o. two tablets twice a day.  2.  Zocor 20 h.s.  3.  Metoprolol 50 mg one half twice a day.  4.  Allopurinol 300 one daily.  5.  Plavix 75 daily.  6.  Aspirin 325 daily.  7.  Flomax 0.4 mg h.s.  8.  Nitroglycerin 0.4 mg p.r.n. p.o.  He is to continue these medications.   PHYSICAL EXAMINATION:  GENERAL:  Reveals a well nourished, well developed  male who just recently has been concerned about some chest pain going into  his arm.  VITAL SIGNS:  His blood pressure is 184/93.  He weighs 207.  Heart rate 76.  His previous blood pressure was 122/74 back in August 2006.  His weight was  202.  EARS:  Clear.  ORAL CAVITY:  Clear.  NECK:  Free of any thyromegaly, cervical adenopathy, or mass.  His larynx,  however, has this raised erythematous area on the left pharyngeal wall which  has been persistent and has been most persistent in the face of antibiotics  and evaluation reveals no other ulcerations or masses but does reveal this  area.  No evidence of any jugular venous distention.  Carotids are 2+  bilaterally without any bruits.  MUSCULOSKELETAL:  The patient is well ambulating.  LUNGS:  His respirations are having no difficulties.  Clear to auscultation.  HEART:  Regular sinus rate and rhythm.  No murmurs.  No opening snaps or  gallops.  ABDOMEN:  Nontender.  No obvious hepatosplenomegaly.  EXTREMITIES:  Showing no clubbing or edema.   He just had a recent chest x-ray showing concern about a possible area of  shadow or malignancy but a CAT scan was done which cleared him of that.  It  did show, however, coronary artery calcification.   INITIAL DIAGNOSES:  1.  Coronary artery disease, status post angioplasty x2 but with present      recurrence of possibility of angina.  2.  Hyperlipidemia.  3.  Hypertension.   Therefore, we are delaying his surgery until this further cardiovascular  evaluation is  completed, even though it has been just a few months previous  that this has been done.           ______________________________  Minna Merritts, M.D.     JC/MEDQ  D:  02/27/2005  T:  02/27/2005  Job:  [id number]   cc:   Nelda Severe. Juventino Slovak, M.D.  Fax: [phone]   Eustace Quail, M.D. [facility]  [address]  [address]  [place]  [place] [address]   Glori Bickers, M.D. [place]  [place] [address]

## 2010-05-25 NOTE — H&P (Signed)
NAMEMarland Kitchen  [name], Robert Clayton NO.:  [id number]   MEDICAL RECORD NO.:  [id number]          PATIENT TYPE:  INP   LOCATION:  4710                         FACILITY:  [facility]   PHYSICIAN:  Jenkins Rouge, M.D.     DATE OF BIRTH:  [birth year]   DATE OF ADMISSION:  02/27/2005  DATE OF DISCHARGE:                                HISTORY & PHYSICAL   PRIMARY CARDIOLOGIST:  Glori Bickers, M.D.   PRIMARY CARE PHYSICIAN:  Kai Furukawa. Juventino Slovak, M.D.   OTOLARYNGOLOGISTMinna Merritts, M.D.   CHIEF COMPLAINT:  Chest pain.   HISTORY OF PRESENT ILLNESS:  [name] is a very pleasant 77 year old  African American male with a history of coronary disease status post two  vessel angioplasty and stenting with drug eluting stents in February of  2006.  He presented to the PACU today for planned laryngoscopy and biopsy.  He has apparently had some irritation in his larynx that has been followed.  He stopped his Plavix and aspirin about five days ago.  He developed some  chest discomfort in the PACU and we were called.  By the time I arrived to  see the patient, his chest pain had dissipated.  He is currently  comfortable.  His pain was difficult to describe.  He described only as a  discomfort. This was located substernally.  He did have some associated left  arm discomfort.  He denied any nausea, dyspnea or presyncope.   MEDICATIONS:  1.  Plavix 75 mg daily - stopped five days ago.  2.  Aspirin 325 mg daily - stopped five days ago.  3.  Zocor 20 mg nightly.  4.  Allopurinol 300 mg daily.  5.  Glucovance 2.5/500 mg two tablets b.i.d.  6.  Lopressor 25 mg b.i.d.   ALLERGIES:  NO KNOWN DRUG ALLERGIES.   PAST MEDICAL HISTORY:  1.  Coronary artery disease.      1.  Catheterization February 2006:  LAD 50% mid, 60% distal, diffuse          distal disease at the apex; left circumflex with high grade lesion          in the ramus and OM-1; RCA 50% mid, several 90 to 95% stenoses in          the  very distal PDA; good LV function.      2.  Percutaneous coronary intervention March 2006:  status post TAXUS          stent to the OM-1; status post TAXUS stent to the distal circumflex.      3.  Stress Myoview October 2006 for chest discomfort:  Small fixed          defect in the septum and apex.  No ischemia.  EF 63%.  2.  Diabetes mellitus type 2.  3.  Chronic renal insufficiency.  4.  Hypertension.  5.  Gout.  6.  Nephrolithiasis.  7.  Benign prostatic hypertrophy.   SOCIAL HISTORY:  The patient denies any tobacco or alcohol abuse.  He is  married.   FAMILY HISTORY:  No significant history for coronary artery disease.  His  mother is deceased.  She did have a history of diabetes mellitus.   REVIEW OF SYSTEMS:  Please see HPI.  Denies any fevers or chills.  He has  had recent left temporal headaches.  Denies any taste or smell disturbances.  Denies any tunnel vision or diplopia.  No rashes.  No coughing.  No  hemoptysis.  No hematuria.  No myalgias, no arthralgias.  No nausea,  vomiting, diarrhea bright red blood per rectum or melena.  He has noticed  some belching recently.  Denies any heat or cold intolerance.  The rest of  review of systems are negative.   PHYSICAL EXAMINATION:  GENERAL APPEARANCE:  He is a well-developed, well-  nourished male in no distress.  VITAL SIGNS:  Blood pressure 184/93, pulse 76, respirations 16, temperature  97.1, weight 207.5 pounds.  HEENT:  Head normocephalic and atraumatic.  Eyes:  PERRLA.  Sclerae are  clear.  Oropharynx is pink without exudate.  NECK:  Without thyromegaly.  LYMPHS:  Without lymphadenopathy.  LUNGS:  Clear to auscultation bilaterally.  CARDIOVASCULAR:  Normal S1 and S2, regular rate and rhythm, no murmurs, no  S3.  VASCULAR:  No carotid bruits bilaterally.  Femoral artery pulses 2+  bilaterally without bruits.  SKIN:  Without rashes.  ABDOMEN:  Soft, nontender with normal active bowel sounds.  No hepatomegaly.   EXTREMITIES:  Without cyanosis.  Trace edema noted bilaterally.  MUSCULOSKELETAL:  Without joint deformity, no CVA tenderness.  NEUROLOGIC:  Alert and oriented x3.  Cranial nerves II-XII grossly intact.  Bilateral upper and lower extremity strength is 5/5 and equal.   Chest x-ray on February 21, 2005, revealed questionable mass in the middle  right upper chest.  Follow-up chest CT on February 26, 2005, revealed no  right lung mass, spurring in the right C6 vertebra and benign cavernous  hemangioma in the liver.   EKG today revealed sinus rhythm of 70, normal axis, no ischemic changes, no  change from previous tracing dated March 15, 2004.   Labs from February 21, 2005, revealed hemoglobin of 13, hematocrit 38,  platelet count 67,000, white count 6600.  Sodium 143, potassium 4.3,  chloride 111, CO2 27, BUN 17, creatinine 1.4, glucose 213.   IMPRESSION AND PLAN:  1.  Chest discomfort.      1.  The patient was interviewed and examined by Dr. Johnsie Cancel today.  With          a history of coronary disease and drug eluting stent placement in          February of 2006 and no antiplatelet therapy in the last five days,          we feel it is best to admit the patient and rule him out with          cardiac enzymes and ECGs.  He did have a recent nonischemic          __________ in October of 2006.  Given the history of his pain and          his past medical history we feel it is best to proceed with          diagnostic cardiac catheterization.  We will plan on doing this          tomorrow.  We will not restart his aspirin unless he has recurrent          pain.  We will  continue to hold his Plavix for now.  We will place          him on heparin without bolus.  If his cardiac catheterization shows          stable disease and no need for intervention, his heparin can be          stopped and laryngoscopy can be performed later this week. 2.  Coronary artery disease.      1.  Status post two vessel  percutaneous transluminal coronary          angioplasty and stenting in February of 2006.  As noted above.  3.  Good left ventricular function and ejection fraction 63% by Myoview in      October of 2006.  4.  Hypertension, uncontrolled.  Will restart beta-blocker today.  If      needed, we could consider adding Norvasc for better blood pressure      control.  5.  Diabetes type 2.  Continue current medications.  6.  Chronic renal insufficiency.  Creatinine is stable.  Will hold his      metformin at the time of this catheterization and watch his renal      function carefully.  7.  Thrombocytopenia, stable.      Robert Clayton, P.A.    ______________________________  Jenkins Rouge, M.D.    SW/MEDQ  D:  02/27/2005  T:  02/28/2005  Job:  [id number]   cc:   Glori Bickers, M.D. [place] [id number]   Sebastien D. Juventino Slovak, M.D.  Fax: [phone]   Minna Merritts, M.D.  Fax: [phone]

## 2010-05-25 NOTE — Discharge Summary (Signed)
NAME:  Robert Clayton, Robert Clayton NO.:  [id number]   MEDICAL RECORD NO.:  [id number]          PATIENT TYPE:  OIB   LOCATION:  6526                         FACILITY:  [facility]   PHYSICIAN:  Eustace Quail, M.D. [facility] DATE OF BIRTH:  12-Nov-1930   DATE OF ADMISSION:  03/07/2004  DATE OF DISCHARGE:  03/08/2004                                 DISCHARGE SUMMARY   PROCEDURES:  1.  Cardiac catheterization.  2.  Coronary arteriogram.  3.  PTCA and Taxus stent to two vessels.   DISCHARGE DIAGNOSES:  1.  Unstable anginal pain, status post percutaneous transluminal coronary      angioplasty and Taxus stent to two vessels this admission.  2.  Thrombocytopenia.  3.  Residual coronary artery disease in the left anterior descending at 50%      and 60%, as well as a 50% mid right coronary artery and 95% distal      posterior descending artery.  4.  Preserved left ventricular function with an ejection fraction of 55% by      cath in February 2006.  5.  Type 2 diabetes.  6.  Hypertension.  7.  Gout.  8.  Benign prostatic hypertrophy.  9.  History of nephrolithiasis.   HOSPITAL COURSE:  [name] is a 77 year old male who had exertional  dyspnea and chest pain.  He was cathed on March 02, 2004, and that cath  showed a 90% OM-1 branch and an 80% circumflex.  It was felt that he needed  percutaneous intervention on these vessels and was brought back for this on  March 07, 2004.   [name] received Taxus stent to the marginal branch of the circumflex,  reducing the stenosis from 90% to 0.  He also received Taxus stent to the  distal circumflex, reducing that stenosis from 80% to 0.  He tolerated the  procedure well.  He is to stay on Plavix for a year.  If he does well  overnight, he is tentatively considered stable for discharge on March 08, 2004.   DISCHARGE MEDICATIONS:  1.  Plavix 75 mg daily.  2.  Glucovance 2.5/500 two tablets b.i.d., restart March 10, 2004.  3.  Allopurinol 100 mg  daily.  4.  Aspirin 325 mg daily.  5.  Flomax 0.4 mg daily.  6.  Lopressor 50 mg one half tab b.i.d.  7.  Zocor 20 mg q.h.s.  8.  Nitroglycerin sublingual p.r.n.      RB/MEDQ  D:  03/07/2004  T:  03/07/2004  Job:  [id number]   cc:   Dr. Meredith Leeds, M.D. [facility]

## 2010-05-25 NOTE — Consult Note (Signed)
NAMEMarland Kitchen  FERRAN, [name]:  [id number]   MEDICAL RECORD NO.:  [id number]          PATIENT TYPE:  INP   LOCATION:  [id number]                         FACILITY:  [facility]   PHYSICIAN:  Robert Panning, MD       DATE OF BIRTH:  March 17, 1930   DATE OF CONSULTATION:  02/28/2005  DATE OF DISCHARGE:                                   CONSULTATION   REASON FOR CONSULTATION:  Thrombocytopenia.   REFERRING PHYSICIAN:  Jenkins Clayton, M.D.   HISTORY OF PRESENT ILLNESS:  Robert Clayton is a pleasant 77 year old African-  American male with known history of CAD, status post PTCA and stenting in  February 2006, admitted on February 21 to the PACU for a planned  laryngoscopy and biopsy for a suspicious laryngeal lesion.  He was on  aspirin and Plavix until February 16.  Since the patient was complaining of  substernal chest pain, a cardiac evaluation was performed prior to the  laryngoscopy.  Workup included as well as CBC initially as an outpatient,  where he was noted to have low platelet count in the 67,000s.  This is on  February 21, 2005.  He also had normal hemoglobin and hematocrit as well as  normal white count.  On questioning, he was found to have chronic  thrombocytopenia, at least dating back to February 2006, ranging anywhere  from the low 140s to now in the 60s, which is coincidental with the  beginning of drug-eluting stents, Plavix initiation.  No acute bleeding.  No  prior history of bleeding disorders.  Currently, he is off aspirin and  Plavix and on heparin.  His admission platelet count was 74,000.  It is  important to mention that he does have a history in the past of prostate  cancer, adenocarcinoma, stage T1C, Gleason 6-10, 3+3, status post placement  of I-125 seeds within the prostate, accompanied by cystoscopy, and a  suspicious lesion per chest x-ray, which was incidental, measuring 1.7 cm in  the lateral segment of the liver, confirmed by CT, suspicious for benign  cavernous hemangioma.  No lung masses seen.  There is a very prominent C6  inferior vertebral body focal spur.  We were asked to see the patient with  recommendations regarding his care.   PAST MEDICAL HISTORY:  1.  CAD, status post angioplasty x2, awaiting catheterization.  2.  Diabetes mellitus.  3.  Chronic renal insufficiency secondary to diabetic nephropathy, with a      creatinine baseline of 1.5.  4.  Hypertension.  5.  Gout.  6.  History of nephrolithiasis.  7.  History of prostate cancer as above.  8.  Hyperlipidemia.   SURGERIES:  1.  Status post cardiac catheterization March 2006, with stenting with Taxus      drug-eluting stents.  2.  Status post cardiac catheterization February 2006.  3.  Status post placement of I-125 seeds within the prostate and cystoscopy,      Dr. Reece Clayton, January 11, 2000.   ALLERGIES:  No known drug allergies.   CURRENT MEDICATIONS:  1.  Zyloprim 300 mg  daily since 1975.  2.  Diabeta 5 mg b.i.d., on hold.  3.  Heparin per pharmacy.  4.  NovoLog as directed.  5.  Glucophage 1000 mg b.i.d.  6.  Lopressor 25 mg b.i.d.  7.  Zocor 20 mg q.h.s.  8.  Valium p.r.n.   REVIEW OF SYSTEMS:  Other than chronic headache, which is intermittent,  worse with caffeine, as well as mild dysphagia, the rest of the review of  systems is essentially negative.  He is at this time not complaining of  chest pain.   FAMILY HISTORY:  Mother died with diabetes.  Father died of old age.  He has  five sisters, three deceased, one what he describes as bone marrow  disease, but he is not sure of what type of bone marrow disease that is.  One sister died with diabetes, one died in a fire, and one brother died with  Parkinson's.   SOCIAL HISTORY:  The patient is married.  He has five children in good  health.  He is retired from [employer].  He never smoked.  No alcohol history.  Lives in [place].  Last physical one month ago.  Last colonoscopy five  years ago.    PHYSICAL EXAMINATION:  GENERAL:  This is a 77 year old black male in no  acute distress, alert and oriented x3.  VITAL SIGNS:  Blood pressure 128/81, pulse 67, respirations 20, temperature  99, pulse oximetry 98% in 2 L.  Weight 299 pounds.  Height feet 11 inches.  He wears dentures.  HEENT:  Normocephalic, atraumatic.  PERRLA.  Oral mucosa without thrush.  NECK:  Supple.  No cervical or supraclavicular masses.  LUNGS:  Clear to auscultation bilaterally.  CARDIOVASCULAR:  Regular rate and rhythm without murmurs, rubs or gallops.  ABDOMEN:  Soft, nontender, bowel sounds x4.  No palpable spleen or liver.  GENITOURINARY, RECTAL:  Deferred.  EXTREMITIES:  No clubbing or cyanosis.  No edema.  SKIN:  Without lesions or bruising.  No petechiae.  NEUROLOGIC:  Nonfocal.   LABORATORY DATA:  Hemoglobin 12.2, hematocrit 34.7, white count 7.3,  platelets 69, was 74 on admission.  PT 13.6, PTT 25, INR 1.0.  Sodium 143,  potassium 4.3, BUN 11, creatinine 1.3, glucose 231, total bilirubin 0.8,  alkaline phosphatase 64, AST 14, ALT 13, total protein 6.4, albumin 3.7,  calcium 9.3.  Troponin less than 0.01.   RADIOLOGY STUDIES:  A CT of the soft tissue of the neck with IV contrast  shows no abnormalities in the left lateral pharyngeal wall or tonsillar  area.  For details of the chest CT, please refer to the HPI.   ASSESSMENT AND PLAN:  Dr. Humphrey Clayton has seen and evaluated this patient, and the  chart has been reviewed.  This is a 77 year old male with a history of  prostate cancer, early stages.  He was admitted for a surgical procedure and  found to have what he describes as incidental thrombocytopenia.  Review of  the patient's old records reveals that this may have been present for quite  some time.  The patient has no history of bleeding.  Diagnostic possibility  is chronic ITP, myelodysplastic syndrome versus medications.   PLAN: 1.  Please check the DIC panel, LDH and HIT panel for further  evaluation of      his thrombocytopenia.  In addition, a peripheral blood smear has been      ordered for review.  2.  A bone marrow biopsy will need to  be done once the patient is stable,      and this can be arranged as an outpatient procedure.  3.  If not necessary, please discontinue heparin use.   Thank you very much for allowing [place] the opportunity to participate in the  care of Robert Clayton.  Further recommendations upon results of the labs  obtained.      Sharene Butters, P.A.      Robert Panning, MD  Electronically Signed    SW/MEDQ  D:  03/01/2005  T:  03/01/2005  Job:  [id number]   cc:   Glori Bickers, M.D. [place] [address]  [place]  [place] [address]   Minna Merritts, M.D.  Fax: [phone]   Nelda Severe. Juventino Slovak, M.D.  Fax: [phone]   Lillette Boxer. Dahl[place]t, M.D.  Fax: [phone]

## 2010-05-29 ENCOUNTER — Ambulatory Visit (INDEPENDENT_AMBULATORY_CARE_PROVIDER_SITE_OTHER): Payer: Medicare Other | Admitting: *Deleted

## 2010-05-29 DIAGNOSIS — D518 Other vitamin B12 deficiency anemias: Secondary | ICD-10-CM

## 2010-05-30 MED ORDER — CYANOCOBALAMIN 1000 MCG/ML IJ SOLN
1000.0000 ug | Freq: Once | INTRAMUSCULAR | Status: AC
  Administered 2010-05-29: 1000 ug via INTRAMUSCULAR

## 2010-06-18 ENCOUNTER — Other Ambulatory Visit: Payer: Self-pay | Admitting: Internal Medicine

## 2010-06-25 ENCOUNTER — Encounter: Payer: Self-pay | Admitting: Internal Medicine

## 2010-06-26 ENCOUNTER — Encounter: Payer: Self-pay | Admitting: Internal Medicine

## 2010-06-26 ENCOUNTER — Other Ambulatory Visit: Payer: Self-pay | Admitting: Internal Medicine

## 2010-06-26 ENCOUNTER — Other Ambulatory Visit (INDEPENDENT_AMBULATORY_CARE_PROVIDER_SITE_OTHER): Payer: Medicare Other

## 2010-06-26 ENCOUNTER — Ambulatory Visit (INDEPENDENT_AMBULATORY_CARE_PROVIDER_SITE_OTHER): Payer: Medicare Other | Admitting: Internal Medicine

## 2010-06-26 VITALS — BP 160/74 | HR 68 | Temp 98.2°F | Resp 16 | Wt 225.0 lb

## 2010-06-26 DIAGNOSIS — E1149 Type 2 diabetes mellitus with other diabetic neurological complication: Secondary | ICD-10-CM

## 2010-06-26 DIAGNOSIS — I10 Essential (primary) hypertension: Secondary | ICD-10-CM

## 2010-06-26 DIAGNOSIS — D518 Other vitamin B12 deficiency anemias: Secondary | ICD-10-CM

## 2010-06-26 DIAGNOSIS — D519 Vitamin B12 deficiency anemia, unspecified: Secondary | ICD-10-CM

## 2010-06-26 DIAGNOSIS — E785 Hyperlipidemia, unspecified: Secondary | ICD-10-CM

## 2010-06-26 DIAGNOSIS — N181 Chronic kidney disease, stage 1: Secondary | ICD-10-CM

## 2010-06-26 DIAGNOSIS — M109 Gout, unspecified: Secondary | ICD-10-CM

## 2010-06-26 LAB — URINALYSIS, ROUTINE W REFLEX MICROSCOPIC
Bilirubin Urine: NEGATIVE
Hgb urine dipstick: NEGATIVE
Ketones, ur: NEGATIVE
Total Protein, Urine: NEGATIVE
Urine Glucose: >=1000

## 2010-06-26 LAB — LDL CHOLESTEROL, DIRECT: Direct LDL: 80.6 mg/dL

## 2010-06-26 LAB — COMPREHENSIVE METABOLIC PANEL
ALT: 24 U/L (ref 0–53)
Albumin: 4 g/dL (ref 3.5–5.2)
CO2: 26 mEq/L (ref 19–32)
Calcium: 9.3 mg/dL (ref 8.4–10.5)
Chloride: 112 mEq/L (ref 96–112)
GFR: 50.79 mL/min — ABNORMAL LOW (ref >60.00)
Potassium: 5.3 mEq/L — ABNORMAL HIGH (ref 3.5–5.1)
Sodium: 144 mEq/L (ref 135–145)
Total Bilirubin: 0.5 mg/dL (ref 0.3–1.2)
Total Protein: 6.7 g/dL (ref 6.0–8.3)

## 2010-06-26 LAB — LIPID PANEL
HDL: 32.1 mg/dL — ABNORMAL LOW (ref >39.00)
Total CHOL/HDL Ratio: 5
Triglycerides: 243 mg/dL — ABNORMAL HIGH (ref 0.0–149.0)

## 2010-06-26 LAB — CBC WITH DIFFERENTIAL/PLATELET
Basophils Relative: 0.4 % (ref 0.0–3.0)
Eosinophils Relative: 1.9 % (ref 0.0–5.0)
Lymphocytes Relative: 25.6 % (ref 12.0–46.0)
Monocytes Relative: 8.8 % (ref 3.0–12.0)
Neutrophils Relative %: 63.3 % (ref 43.0–77.0)
Platelets: 99 10*3/uL — ABNORMAL LOW (ref 150.0–400.0)
RBC: 4.07 Mil/uL — ABNORMAL LOW (ref 4.22–5.81)
WBC: 7.3 10*3/uL (ref 4.5–10.5)

## 2010-06-26 LAB — HEMOGLOBIN A1C: Hgb A1c MFr Bld: 11 % — ABNORMAL HIGH (ref 4.6–6.5)

## 2010-06-26 LAB — URIC ACID: Uric Acid, Serum: 3.8 mg/dL — ABNORMAL LOW (ref 4.0–7.8)

## 2010-06-26 MED ORDER — AMLODIPINE BESYLATE-VALSARTAN 5-160 MG PO TABS: 1.0000 | ORAL_TABLET | Freq: Every day | ORAL | Status: DC

## 2010-06-26 MED ORDER — INSULIN GLARGINE 100 UNIT/ML ~~LOC~~ SOLN: 100.0000 [IU] | Freq: Every day | SUBCUTANEOUS | Status: DC

## 2010-06-26 MED ORDER — LINAGLIPTIN 5 MG PO TABS: 5.0000 mg | ORAL_TABLET | Freq: Every day | ORAL | Status: DC

## 2010-06-26 MED ORDER — CYANOCOBALAMIN 1000 MCG/ML IJ SOLN
1000.0000 ug | Freq: Once | INTRAMUSCULAR | Status: AC
  Administered 2010-06-26: 1000 ug via INTRAMUSCULAR

## 2010-06-26 NOTE — Assessment & Plan Note (Addendum)
No gout flares noted, I wil check his uric acid level to see if it is at the goal of less than 6

## 2010-06-26 NOTE — Assessment & Plan Note (Signed)
I will improve blood sugar control with an increased lantus dose and starting tradjenta. Also will check his c-peptide and islet cell ab to see if he is more type I or type II diabetic

## 2010-06-26 NOTE — Assessment & Plan Note (Signed)
I will get better BP control with Exforge

## 2010-06-26 NOTE — Assessment & Plan Note (Signed)
Recheck his renal function today

## 2010-06-26 NOTE — Progress Notes (Signed)
Subjective:    Patient ID: Robert Clayton, male    DOB: [birth year], 77 y.o.   MRN: [id number]  Diabetes He presents for his follow-up diabetic visit. He has type 2 diabetes mellitus. His disease course has been worsening. There are no hypoglycemic associated symptoms. Pertinent negatives for hypoglycemia include no dizziness, headaches, pallor, seizures, speech difficulty, sweats or tremors. There are no diabetic associated symptoms. Pertinent negatives for diabetes include no blurred vision, no chest pain, no fatigue and no weakness. There are no hypoglycemic complications. Symptoms are stable. There are no diabetic complications. Current diabetic treatment includes intensive insulin program and oral agent (monotherapy). He is compliant with treatment all of the time. His weight is increasing steadily. He is following a generally unhealthy diet. When asked about meal planning, he reported none. He has had a previous visit with a dietician. He participates in exercise intermittently. His home blood glucose trend is increasing steadily. His breakfast blood glucose range is generally 140-180 mg/dl. His lunch blood glucose range is generally 180-200 mg/dl. His dinner blood glucose range is generally 180-200 mg/dl. His highest blood glucose is >200 mg/dl. His overall blood glucose range is 180-200 mg/dl. An ACE inhibitor/angiotensin II receptor blocker is being taken. He does not see a podiatrist.Eye exam is current.  Hypertension This is a chronic problem. The current episode started more than 1 year ago. The problem has been gradually worsening since onset. The problem is uncontrolled. Pertinent negatives include no anxiety, blurred vision, chest pain, headaches, malaise/fatigue, neck pain, orthopnea, palpitations, peripheral edema, PND, shortness of breath or sweats. There are no associated agents to hypertension. Past treatments include ACE inhibitors and calcium channel blockers. The current treatment  provides mild improvement. Compliance problems include diet and exercise.       Review of Systems  Constitutional: Negative for fever, chills, malaise/fatigue, diaphoresis, activity change, appetite change, fatigue and unexpected weight change.  HENT: Negative for sore throat, trouble swallowing, neck pain and voice change.   Eyes: Negative for blurred vision, photophobia, redness and visual disturbance.  Respiratory: Negative for apnea, cough, choking, chest tightness, shortness of breath, wheezing and stridor.   Cardiovascular: Negative for chest pain, palpitations, orthopnea, leg swelling and PND.  Gastrointestinal: Negative for nausea, abdominal pain, diarrhea, constipation, blood in stool, abdominal distention, anal bleeding and rectal pain.  Genitourinary: Negative for dysuria, urgency, frequency, hematuria, decreased urine volume, enuresis and difficulty urinating.  Musculoskeletal: Negative for myalgias, back pain, joint swelling, arthralgias and gait problem.  Skin: Negative for color change, pallor and rash.  Neurological: Negative for dizziness, tremors, seizures, syncope, facial asymmetry, speech difficulty, weakness, light-headedness, numbness and headaches.  Hematological: Negative for adenopathy. Does not bruise/bleed easily.  Psychiatric/Behavioral: Negative.        Objective:   Physical Exam  Vitals reviewed. Constitutional: He is oriented to person, place, and time. He appears well-developed and well-nourished. No distress.  HENT:  Head: Normocephalic and atraumatic.  Right Ear: External ear normal.  Left Ear: External ear normal.  Nose: Nose normal.  Mouth/Throat: Oropharynx is clear and moist. No oropharyngeal exudate.  Eyes: Conjunctivae and EOM are normal. Pupils are equal, round, and reactive to light. Right eye exhibits no discharge. Left eye exhibits no discharge. No scleral icterus.  Neck: Normal range of motion. Neck supple. No JVD present. No tracheal  deviation present. No thyromegaly present.  Cardiovascular: Normal rate, regular rhythm, normal heart sounds and intact distal pulses.  Exam reveals no gallop and no friction rub.   No murmur  heard. Pulmonary/Chest: Effort normal and breath sounds normal. No stridor. No respiratory distress. He has no wheezes. He has no rales. He exhibits no tenderness.  Abdominal: Soft. Bowel sounds are normal. He exhibits no distension and no mass. There is no tenderness. There is no rebound and no guarding.  Musculoskeletal: He exhibits edema (trace edema in both legs). He exhibits no tenderness.  Lymphadenopathy:    He has no cervical adenopathy.  Neurological: He is alert and oriented to person, place, and time. He has normal reflexes.  Skin: Skin is warm and dry. No rash noted. He is not diaphoretic. No erythema. No pallor.  Psychiatric: He has a normal mood and affect. His behavior is normal. Judgment and thought content normal.        Lab Results  Component Value Date   WBC 9.0 02/27/2010   HGB 13.4 02/27/2010   HCT 39.6 02/27/2010   PLT 97.0* 02/27/2010   CHOL 137 02/27/2010   TRIG 219.0* 02/27/2010   HDL 31.30* 02/27/2010   LDLDIRECT 77.8 02/27/2010   ALT 15 02/27/2010   AST 15 02/27/2010   NA 145 02/27/2010   K 4.5 02/27/2010   CL 113* 02/27/2010   CREATININE 1.8* 02/27/2010   BUN 26* 02/27/2010   CO2 24 02/27/2010   TSH 1.66 02/27/2010   PSA 0.03* 06/29/2009   INR 1.1 04/08/2008   HGBA1C 10.4* 02/27/2010   MICROALBUR 1.7 03/23/2008    Assessment & Plan:

## 2010-06-26 NOTE — Assessment & Plan Note (Signed)
He is doing well on Simvastatin

## 2010-06-26 NOTE — Assessment & Plan Note (Signed)
Will improve BP control with a change to Exforge

## 2010-06-26 NOTE — Patient Instructions (Signed)
Diabetes, Type 2 Diabetes is a lasting (chronic) disease. In type 2 diabetes, the pancreas does not make enough insulin (a hormone), and the body does not respond normally to the insulin that is made. This type of diabetes was also previously called adult onset diabetes. About 90% of all those who have diabetes have type 2. It usually occurs after the age of 23 but can occur at any age. CAUSES Unlike type 1 diabetes, which happens because insulin is no longer being made, type 2 diabetes happens because the body is making less insulin and has trouble using the insulin properly. SYMPTOMS  Drinking more than usual.   Urinating more than usual.   Blurred vision.   Dry, itchy skin.   Frequent infection like yeast infections in women.   More tired than usual (fatigue).  TREATMENT  Healthy eating.   Exercise.   Medication, if needed.   Monitoring blood glucose (sugar).   Seeing your caregiver regularly.  HOME CARE INSTRUCTIONS  Check your blood glucose (sugar) at least once daily. More frequent monitoring may be necessary, depending on your medications and on how well your diabetes is controlled. Your caregiver will advise you.   Take your medicine as directed by your caregiver.   Do not smoke.   Make wise food choices. Ask your caregiver for information. Weight loss can improve your diabetes.   Learn about low blood glucose (hypoglycemia) and how to treat it.   Get your eyes checked regularly.   Have a yearly physical exam. Have your blood pressure checked. Get your blood and urine tested.   Wear a pendant or bracelet saying that you have diabetes.   Check your feet every night for sores. Let your caregiver know if you have sores that are not healing.  SEEK MEDICAL CARE IF:  You are having problems keeping your blood glucose at target range.   You feel you might be having problems with your medicines.   You have symptoms of an illness that is not improving after 24  hours.   You have a sore or wound that is not healing.   You notice a change in vision or a new problem with your vision.  You develop a fever of more than 100.5Hypertension (High Blood Pressure) As your heart beats, it forces blood through your arteries. This force is your blood pressure. If the pressure is too high, it is called hypertension (HTN) or high blood pressure. HTN is dangerous because you may have it and not know it. High blood pressure may mean that your heart has to work harder to pump blood. Your arteries may be narrow or stiff. The extra work puts you at risk for heart disease, stroke, and other problems.  Blood pressure consists of two numbers, a higher number over a lower, 110/72, for example. It is stated as "110 over 72." The ideal is below 120 for the top number (systolic) and under 80 for the bottom (diastolic). Write down your blood pressure today. You should pay close attention to your blood pressure if you have certain conditions such as: Heart failure. Prior heart attack.  Diabetes  Chronic kidney disease.  Prior stroke.  Multiple risk factors for heart disease.   To see if you have HTN, your blood pressure should be measured while you are seated with your arm held at the level of the heart. It should be measured at least twice. A one-time elevated blood pressure reading (especially in the Emergency Department) does not mean  that you need treatment. There may be conditions in which the blood pressure is different between your right and left arms. It is important to see your caregiver soon for a recheck. Most people have essential hypertension which means that there is not a specific cause. This type of high blood pressure may be lowered by changing lifestyle factors such as: Stress. Smoking.  Lack of exercise.  Excessive weight. Drug/tobacco/alcohol use.  Eating less salt.   Most people do not have symptoms from high blood pressure until it has caused damage to the  body. Effective treatment can often prevent, delay or reduce that damage. TREATMENT Treatment for high blood pressure, when a cause has been identified, is directed at the cause. There are a large number of medications to treat HTN. These fall into several categories, and your caregiver will help you select the medicines that are best for you. Medications may have side effects. You should review side effects with your caregiver. If your blood pressure stays high after you have made lifestyle changes or started on medicines,  Your medication(s) may need to be changed.  Other problems may need to be addressed.  Be certain you understand your prescriptions, and know how and when to take your medicine.  Be sure to follow up with your caregiver within the time frame advised (usually within two weeks) to have your blood pressure rechecked and to review your medications.  If you are taking more than one medicine to lower your blood pressure, make sure you know how and at what times they should be taken. Taking two medicines at the same time can result in blood pressure that is too low.  SEEK IMMEDIATE MEDICAL CARE IF YOU DEVELOP: A severe headache, blurred or changing vision, or confusion.  Unusual weakness or numbness, or a faint feeling.  Severe chest or abdominal pain, vomiting, or breathing problems.  MAKE SURE YOU:  Understand these instructions.  Will watch your condition.  Will get help right away if you are not doing well or get worse.  Document Released: 12/24/2004 Document Re-Released: 06/13/2009  [facility] Patient Information 2011 [place]..  Document Released: 12/24/2004 Document Re-Released: 01/15/2009 [facility] Patient Information 2011 [place].

## 2010-07-12 ENCOUNTER — Encounter: Payer: Self-pay | Admitting: Internal Medicine

## 2010-07-12 ENCOUNTER — Other Ambulatory Visit (INDEPENDENT_AMBULATORY_CARE_PROVIDER_SITE_OTHER): Payer: Medicare Other

## 2010-07-12 ENCOUNTER — Ambulatory Visit (INDEPENDENT_AMBULATORY_CARE_PROVIDER_SITE_OTHER): Payer: Medicare Other | Admitting: Internal Medicine

## 2010-07-12 DIAGNOSIS — E875 Hyperkalemia: Secondary | ICD-10-CM

## 2010-07-12 DIAGNOSIS — N181 Chronic kidney disease, stage 1: Secondary | ICD-10-CM

## 2010-07-12 DIAGNOSIS — K921 Melena: Secondary | ICD-10-CM

## 2010-07-12 DIAGNOSIS — E1149 Type 2 diabetes mellitus with other diabetic neurological complication: Secondary | ICD-10-CM

## 2010-07-12 DIAGNOSIS — E785 Hyperlipidemia, unspecified: Secondary | ICD-10-CM

## 2010-07-12 DIAGNOSIS — I10 Essential (primary) hypertension: Secondary | ICD-10-CM

## 2010-07-12 LAB — BASIC METABOLIC PANEL
Chloride: 114 mEq/L — ABNORMAL HIGH (ref 96–112)
GFR: 37.26 mL/min — ABNORMAL LOW (ref >60.00)
Potassium: 4.6 mEq/L (ref 3.5–5.1)
Sodium: 141 mEq/L (ref 135–145)

## 2010-07-12 MED ORDER — OMEGA-3-ACID ETHYL ESTERS 1 G PO CAPS: 2.0000 g | ORAL_CAPSULE | Freq: Two times a day (BID) | ORAL | Status: DC

## 2010-07-12 NOTE — Assessment & Plan Note (Signed)
Insulin dose will be increased

## 2010-07-12 NOTE — Patient Instructions (Signed)
Hypertriglyceridemia  Diet for High blood levels of Triglycerides Most fats in food are triglycerides. Triglycerides in your blood are stored as fat in your body. High levels of triglycerides in your blood may put you at a greater risk for heart disease and stroke.  Normal triglyceride levels are less than 150 mg/dL. Borderline high levels are 150-199 mg/dl. High levels are 200 - 499 mg/dL, and very high triglyceride levels are greater than 500 mg/dL. The decision to treat high triglycerides is generally based on the level. For people with borderline or high triglyceride levels, treatment includes weight loss and exercise. Drugs are recommended for people with very high triglyceride levels. Many people who need treatment for high triglyceride levels have metabolic syndrome. This syndrome is a collection of disorders that often include: insulin resistance, high blood pressure, blood clotting problems, high cholesterol and triglycerides. TESTING PROCEDURE FOR TRIGLYCERIDES  You should not eat 4 hours before getting your triglycerides measured. The normal range of triglycerides is between 10 and 250 milligrams per deciliter (mg/dl). Some people may have extreme levels (1000 or above), but your triglyceride level may be too high if it is above 150 mg/dl, depending on what other risk factors you have for heart disease.   People with high blood triglycerides may also have high blood cholesterol levels. If you have high blood cholesterol as well as high blood triglycerides, your risk for heart disease is probably greater than if you only had high triglycerides. High blood cholesterol is one of the main risk factors for heart disease.  CHANGING YOUR DIET  Your weight can affect your blood triglyceride level. If you are more than 20% above your ideal body weight, you may be able to lower your blood triglycerides by losing weight. Eating less and exercising regularly is the best way to combat this. Fat provides  more calories than any other food. The best way to lose weight is to eat less fat. Only 30% of your total calories should come from fat. Less than 7% of your diet should come from saturated fat. A diet low in fat and saturated fat is the same as a diet to decrease blood cholesterol. By eating a diet lower in fat, you may lose weight, lower your blood cholesterol, and lower your blood triglyceride level.  Eating a diet low in fat, especially saturated fat, may also help you lower your blood triglyceride level. Ask your dietitian to help you figure how much fat you can eat based on the number of calories your caregiver has prescribed for you.  Exercise, in addition to helping with weight loss may also help lower triglyceride levels.   Alcohol can increase blood triglycerides. You may need to stop drinking alcoholic beverages.   Too much carbohydrate in your diet may also increase your blood triglycerides. Some complex carbohydrates are necessary in your diet. These may include bread, rice, potatoes, other starchy vegetables and cereals.   Reduce "simple" carbohydrates. These may include pure sugars, candy, honey, and jelly without losing other nutrients. If you have the kind of high blood triglycerides that is affected by the amount of carbohydrates in your diet, you will need to eat less sugar and less high-sugar foods. Your caregiver can help you with this.   Adding 2-4 grams of fish oil (EPA+ DHA) may also help lower triglycerides. Speak with your caregiver before adding any supplements to your regimen.  Following the Diet  Maintain your ideal weight. Your caregivers can help you with a diet. Generally,   eating less food and getting more exercise will help you lose weight. Joining a weight control group may also help. Ask your caregivers for a good weight control group in your area.  Eat low-fat foods instead of high-fat foods. This can help you lose weight too.  These foods are lower in fat. Eat MORE  of these:   Dried beans, peas, and lentils.   Egg whites.   Low-fat cottage cheese.   Fish.   Lean cuts of meat, such as round, sirloin, rump, and flank (cut extra fat off meat you fix).   Whole grain breads, cereals and pasta.   Skim and nonfat dry milk.   Low-fat yogurt.   Poultry without the skin.   Cheese made with skim or part-skim milk, such as mozzarella, parmesan, farmers', ricotta, or pot cheese.   These are higher fat foods. Eat LESS of these:   Whole milk and foods made from whole milk, such as American, blue, cheddar, monterey jack, and swiss cheese   High-fat meats, such as luncheon meats, sausages, knockwurst, bratwurst, hot dogs, ribs, corned beef, ground pork, and regular ground beef.   Fried foods.  Limit saturated fats in your diet. Substituting unsaturated fat for saturated fat may decrease your blood triglyceride level. You will need to read package labels to know which products contain saturated fats.  These foods are high in saturated fat. Eat LESS of these:   Fried pork skins.  Whole milk.   Skin and fat from poultry.   Palm oil.   Butter.   Shortening.   Cream cheese.   Berniece Salines.   Margarines and baked goods made from listed oils.   Vegetable shortenings.   Chitterlings.  Fat from meats.   Coconut oil.   Palm kernel oil.   Lard.   Cream.   Sour cream.   Fatback.   Coffee whiteners and non-dairy creamers made with these oils.   Cheese made from whole milk.   Use unsaturated fats (both polyunsaturated and monounsaturated) moderately. Remember, even though unsaturated fats are better than saturated fats; you still want a diet low in total fat.  These foods are high in unsaturated fat:   Canola oil.  Sunflower oil.   Mayonnaise.   Almonds.   Peanuts.   Pine nuts.   Margarines made with these oils.   Safflower oil.  Olive oil.   Avocados.   Cashews.   Peanut butter.   Sunflower seeds.   Soybean  oil.  Peanut oil.   Olives.   Pecans.   Walnuts.   Pumpkin seeds.   Avoid sugar and other high-sugar foods. This will decrease carbohydrates without decreasing other nutrients. Sugar in your food goes rapidly to your blood. When there is excess sugar in your blood, your liver may use it to make more triglycerides. Sugar also contains calories without other important nutrients.  Eat LESS of these:   Sugar, brown sugar, powdered sugar, jam, jelly, preserves, honey, syrup, molasses, pies, candy, cakes, cookies, frosting, pastries, colas, soft drinks, punches, fruit drinks, and regular gelatin.   Avoid alcohol. Alcohol, even more than sugar, may increase blood triglycerides. In addition, alcohol is high in calories and low in nutrients. Ask for sparkling water, or a diet soft drink instead of an alcoholic beverage.  Suggestions for planning and preparing meals   Bake, broil, grill or roast meats instead of frying.   Remove fat from meats and skin from poultry before cooking.   Add spices, herbs, lemon juice  or vinegar to vegetables instead of salt, rich sauces or gravies.   Use a non-stick skillet without fat or use no-stick sprays.   Cool and refrigerate stews and broth. Then remove the hardened fat floating on the surface before serving.   Refrigerate meat drippings and skim off fat to make low-fat gravies.   Serve more fish.   Use less butter, margarine and other high-fat spreads on bread or vegetables.   Use skim or reconstituted non-fat dry milk for cooking.   Cook with low-fat cheeses.   Substitute low-fat yogurt or cottage cheese for all or part of the sour cream in recipes for sauces, dips or congealed salads.   Use half yogurt/half mayonnaise in salad recipes.   Substitute evaporated skim milk for cream. Evaporated skim milk or reconstituted non-fat dry milk can be whipped and substituted for whipped cream in certain recipes.   Choose fresh fruits for dessert instead  of high-fat foods such as pies or cakes. Fruits are naturally low in fat.  When Dining Out   Order low-fat appetizers such as fruit or vegetable juice, pasta with vegetables or tomato sauce.   Select clear, rather than cream soups.   Ask that dressings and gravies be served on the side. Then use less of them.   Order foods that are baked, broiled, poached, steamed, stir-fried, or roasted.   Ask for margarine instead of butter, and use only a small amount.   Drink sparkling water, unsweetened tea or coffee, or diet soft drinks instead of alcohol or other sweet beverages.  QUESTIONS AND ANSWERS ABOUT OTHER FATS IN THE BLOOD:  SATURATED FAT, TRANS FAT, AND CHOLESTEROL What is trans fat? Trans fat is a type of fat that is formed when vegetable oil is hardened through a process called hydrogenation. This process helps makes foods more solid, gives them shape, and prolongs their shelf life. Trans fats are also called hydrogenated or partially hydrogenated oils.  What do saturated fat, trans fat, and cholesterol in foods have to do with heart disease? Saturated fat, trans fat, and cholesterol in the diet all raise the level of LDL "bad" cholesterol in the blood. The higher the LDL cholesterol, the greater the risk for coronary heart disease (CHD). Saturated fat and trans fat raise LDL similarly.  What foods contain saturated fat, trans fat, and cholesterol? High amounts of saturated fat are found in animal products, such as fatty cuts of meat, chicken skin, and full-fat dairy products like butter, whole milk, cream, and cheese, and in tropical vegetable oils such as palm, palm kernel, and coconut oil. Trans fat is found in some of the same foods as saturated fat, such as vegetable shortening, some margarines (especially hard or stick margarine), crackers, cookies, baked goods, fried foods, salad dressings, and other processed foods made with partially hydrogenated vegetable oils. Small amounts of trans  fat also occur naturally in some animal products, such as milk products, beef, and lamb. Foods high in cholesterol include liver, other organ meats, egg yolks, shrimp, and full-fat dairy products. How can I use the new food label to make heart-healthy food choices? Check the Nutrition Facts panel of the food label. Choose foods lower in saturated fat, trans fat, and cholesterol. For saturated fat and cholesterol, you can also use the Percent Daily Value (%DV): 5% DV or less is low, and 20% DV or more is high. (There is no %DV for trans fat.) Use the Nutrition Facts panel to choose foods low in saturated fat  and cholesterol, and if the trans fat is not listed, read the ingredients and limit products that list shortening or hydrogenated or partially hydrogenated vegetable oil, which tend to be high in trans fat. POINTS TO REMEMBER: YOU NEED A LITTLE TLC (THERAPEUTIC LIFESTYLE CHANGES)  Discuss your risk for heart disease with your caregivers, and take steps to reduce risk factors.   Change your diet. Choose foods that are low in saturated fat, trans fat, and cholesterol.   Add exercise to your daily routine if it is not already being done. Participate in physical activity of moderate intensity, like brisk walking, for at least 30 minutes on most, and preferably all days of the week. No time? Break the 30 minutes into three, 10-minute segments during the day.   Stop smoking. If you do smoke, contact your caregiver to discuss ways in which they can help you quit.   Do not use street drugs.   Maintain a normal weight.   Maintain a healthy blood pressure.   Keep up with your blood work for checking the fats in your blood as directed by your caregiver.  Document Released: 10/12/2003 Document Re-Released: 06/13/2009 [facility] Patient Information 2011 [place].

## 2010-07-12 NOTE — Progress Notes (Signed)
Subjective:    Patient ID: Robert Clayton, male    DOB: 03/09/1930, 77 y.o.   MRN: [id number]  Diabetes He presents for his follow-up diabetic visit. He has type 2 diabetes mellitus. His disease course has been worsening. Pertinent negatives for hypoglycemia include no confusion, dizziness, headaches, hunger, mood changes, nervousness/anxiousness, pallor, seizures, sleepiness, speech difficulty, sweats or tremors. Pertinent negatives for diabetes include no blurred vision, no chest pain, no fatigue, no foot paresthesias, no foot ulcerations, no polydipsia, no polyphagia, no polyuria, no visual change, no weakness and no weight loss. Risk factors for coronary artery disease include no known risk factors. Current diabetic treatment includes oral agent (dual therapy) and intensive insulin program. His weight is stable. He is following a generally unhealthy diet. When asked about meal planning, he reported none. He participates in exercise intermittently. His home blood glucose trend is decreasing steadily. His breakfast blood glucose range is generally 110-130 mg/dl. His lunch blood glucose range is generally 140-180 mg/dl. His dinner blood glucose range is generally 140-180 mg/dl. His highest blood glucose is 140-180 mg/dl. His overall blood glucose range is 140-180 mg/dl. An ACE inhibitor/angiotensin II receptor blocker is being taken. He does not see a podiatrist.Eye exam is current.    He returns for f/up and tells me that he has noticed some blood in his stool over the last few weeks.  Also, he has not increased his insulin dose yet.  Review of Systems  Constitutional: Negative for fever, chills, weight loss, diaphoresis, activity change, appetite change, fatigue and unexpected weight change.  HENT: Negative.   Eyes: Negative for blurred vision, photophobia and visual disturbance.  Respiratory: Negative for apnea, cough, choking, chest tightness, shortness of breath, wheezing and stridor.     Cardiovascular: Negative for chest pain, palpitations and leg swelling.  Gastrointestinal: Positive for blood in stool. Negative for nausea, vomiting, abdominal pain, diarrhea, constipation, abdominal distention and anal bleeding.  Genitourinary: Negative.  Negative for polyuria.  Musculoskeletal: Negative.  Negative for myalgias, back pain, joint swelling, arthralgias and gait problem.  Skin: Negative for color change, pallor and rash.  Neurological: Negative for dizziness, tremors, seizures, syncope, facial asymmetry, speech difficulty, weakness, light-headedness, numbness and headaches.  Hematological: Negative for polydipsia, polyphagia and adenopathy. Does not bruise/bleed easily.  Psychiatric/Behavioral: Negative for behavioral problems, confusion and agitation. The patient is not nervous/anxious.        Objective:   Physical Exam  Vitals reviewed. Constitutional: He is oriented to person, place, and time. He appears well-developed and well-nourished. No distress.  HENT:  Head: Normocephalic and atraumatic.  Right Ear: External ear normal.  Left Ear: External ear normal.  Nose: Nose normal.  Mouth/Throat: Oropharynx is clear and moist. No oropharyngeal exudate.  Eyes: Conjunctivae and EOM are normal. Pupils are equal, round, and reactive to light. Right eye exhibits no discharge. Left eye exhibits no discharge. No scleral icterus.  Neck: Normal range of motion. Neck supple. No JVD present. No tracheal deviation present. No thyromegaly present.  Cardiovascular: Normal rate, regular rhythm, normal heart sounds and intact distal pulses.  Exam reveals no gallop and no friction rub.   No murmur heard. Pulmonary/Chest: Effort normal and breath sounds normal. No stridor. No respiratory distress. He has no wheezes. He has no rales. He exhibits no tenderness.  Abdominal: Soft. Bowel sounds are normal. He exhibits no distension and no mass. There is no tenderness. There is no rebound and no  guarding. Hernia confirmed negative in the right inguinal area and confirmed  negative in the left inguinal area.  Genitourinary: Prostate normal, testes normal and penis normal. Rectal exam shows external hemorrhoid. Rectal exam shows no internal hemorrhoid, no fissure, no mass, no tenderness and anal tone normal. Guaiac positive stool. Prostate is not enlarged and not tender. Right testis shows no mass, no swelling and no tenderness. Right testis is descended. Cremasteric reflex is not absent on the right side. Left testis shows no mass, no swelling and no tenderness. Left testis is descended. Cremasteric reflex is not absent on the left side. Uncircumcised. No phimosis, paraphimosis, hypospadias, penile erythema or penile tenderness. No discharge found.  Musculoskeletal: Normal range of motion. He exhibits edema (1+ edema in both legs). He exhibits no tenderness.  Lymphadenopathy:    He has no cervical adenopathy.       Right: No inguinal adenopathy present.       Left: No inguinal adenopathy present.  Neurological: He is alert and oriented to person, place, and time. He has normal reflexes. He displays normal reflexes. No cranial nerve deficit. He exhibits normal muscle tone. Coordination normal.  Skin: Skin is warm, dry and intact. No abrasion, no bruising, no burn, no ecchymosis, no laceration, no lesion, no petechiae, no purpura and no rash noted. Rash is not macular, not papular, not maculopapular, not nodular, not pustular, not vesicular and not urticarial. He is not diaphoretic. No cyanosis or erythema. No pallor. Nails show no clubbing.       He has the sign of leser trelat across his back  Psychiatric: He has a normal mood and affect. His behavior is normal. Judgment and thought content normal.        Lab Results  Component Value Date   WBC 7.3 06/26/2010   HGB 12.5* 06/26/2010   HCT 37.3* 06/26/2010   PLT 99.0* 06/26/2010   CHOL 152 06/26/2010   TRIG 243.0* 06/26/2010   HDL 32.10*  06/26/2010   LDLDIRECT 80.6 06/26/2010   ALT 24 06/26/2010   AST 19 06/26/2010   NA 144 06/26/2010   K 5.3* 06/26/2010   CL 112 06/26/2010   CREATININE 1.7* 06/26/2010   BUN 24* 06/26/2010   CO2 26 06/26/2010   TSH 1.29 06/26/2010   PSA 0.03* 06/29/2009   INR 1.1 04/08/2008   HGBA1C 11.0* 06/26/2010   MICROALBUR 1.7 03/23/2008    Assessment & Plan:

## 2010-07-12 NOTE — Assessment & Plan Note (Signed)
I am concerned about GI malignancy so I have referred him to GI to consider endoscopy

## 2010-07-12 NOTE — Assessment & Plan Note (Signed)
His BP is well controlled 

## 2010-07-12 NOTE — Assessment & Plan Note (Signed)
I will recheck his K+ level today

## 2010-07-12 NOTE — Assessment & Plan Note (Signed)
This is stable 

## 2010-07-15 ENCOUNTER — Other Ambulatory Visit: Payer: Self-pay | Admitting: Cardiovascular Disease

## 2010-07-15 ENCOUNTER — Other Ambulatory Visit: Payer: Self-pay | Admitting: Internal Medicine

## 2010-07-17 ENCOUNTER — Encounter: Payer: Self-pay | Admitting: Gastroenterology

## 2010-07-26 ENCOUNTER — Ambulatory Visit (INDEPENDENT_AMBULATORY_CARE_PROVIDER_SITE_OTHER): Payer: Medicare Other | Admitting: *Deleted

## 2010-07-26 DIAGNOSIS — D518 Other vitamin B12 deficiency anemias: Secondary | ICD-10-CM

## 2010-07-27 DIAGNOSIS — D518 Other vitamin B12 deficiency anemias: Secondary | ICD-10-CM

## 2010-07-27 MED ORDER — CYANOCOBALAMIN 1000 MCG/ML IJ SOLN
1000.0000 ug | Freq: Once | INTRAMUSCULAR | Status: AC
  Administered 2010-07-26: 1000 ug via INTRAMUSCULAR

## 2010-07-27 MED ORDER — CYANOCOBALAMIN 1000 MCG/ML IJ SOLN
1000.0000 ug | Freq: Once | INTRAMUSCULAR | Status: DC
  Administered 2010-07-27: 1000 ug via INTRAMUSCULAR

## 2010-08-25 ENCOUNTER — Other Ambulatory Visit: Payer: Self-pay | Admitting: Internal Medicine

## 2010-08-27 ENCOUNTER — Ambulatory Visit (INDEPENDENT_AMBULATORY_CARE_PROVIDER_SITE_OTHER): Payer: Medicare Other | Admitting: *Deleted

## 2010-08-27 DIAGNOSIS — E538 Deficiency of other specified B group vitamins: Secondary | ICD-10-CM

## 2010-08-27 DIAGNOSIS — D518 Other vitamin B12 deficiency anemias: Secondary | ICD-10-CM

## 2010-08-27 MED ORDER — CYANOCOBALAMIN 1000 MCG/ML IJ SOLN
1000.0000 ug | Freq: Once | INTRAMUSCULAR | Status: AC
  Administered 2010-08-27: 1000 ug via INTRAMUSCULAR

## 2010-08-27 NOTE — Progress Notes (Signed)
Pt tolerated injection well.  He will return for next injection at office visit is September.

## 2010-09-03 ENCOUNTER — Encounter: Payer: Self-pay | Admitting: Gastroenterology

## 2010-09-03 ENCOUNTER — Ambulatory Visit (INDEPENDENT_AMBULATORY_CARE_PROVIDER_SITE_OTHER): Payer: Medicare Other | Admitting: Gastroenterology

## 2010-09-03 DIAGNOSIS — Z87448 Personal history of other diseases of urinary system: Secondary | ICD-10-CM | POA: Insufficient documentation

## 2010-09-03 DIAGNOSIS — K921 Melena: Secondary | ICD-10-CM

## 2010-09-03 MED ORDER — PEG-KCL-NACL-NASULF-NA ASC-C 100 G PO SOLR: 1.0000 | Freq: Once | ORAL | Status: DC

## 2010-09-03 NOTE — Progress Notes (Signed)
History of Present Illness: Mr. Volker has returned for evaluation of Hemoccult-positive stools. He was seen by Dr. Ronnald Ramp last month where he claimed he had rectal bleeding. In further conversation with him he actually had one episode of hematuria. Nonetheless, on physical exam he tested Hemoccult positive. He denies change of bowel habits, rectal bleeding or abdominal pain. Has not had colonoscopy for several years .  Hemoglobin was 12.5 in June, 2012   He was seen in January 2012 for dysphagia. 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 He underwent upper endoscopy and dilatation of early esophageal stricture.  He has not had any recurrence of dysphagia.    Review of Systems: Pertinent positive and negative review of systems were noted in the above HPI section. All other review of systems were otherwise negative.    Current Medications, Allergies, Past Medical History, Past Surgical History, Family History and Social History were reviewed in [facility] record  Vital signs were reviewed in today's medical record. Physical Exam: General: Well developed , well nourished, no acute distress

## 2010-09-03 NOTE — Assessment & Plan Note (Signed)
Normal hemoglobin and MCV. Endoscopy in January, 2012 showed an early esophageal stricture.  Recommendations #1 colonoscopy to rule out a lower GI bleeding source

## 2010-09-03 NOTE — Patient Instructions (Signed)
Your colonoscopy is scheduled on 09/13/2010 at [facility] has been sent to your pharmacy today Your appointment at [facility] is scheduled on 09/06/2010 at 9:30am

## 2010-09-11 ENCOUNTER — Ambulatory Visit (INDEPENDENT_AMBULATORY_CARE_PROVIDER_SITE_OTHER): Payer: Medicare Other | Admitting: Internal Medicine

## 2010-09-11 ENCOUNTER — Other Ambulatory Visit (INDEPENDENT_AMBULATORY_CARE_PROVIDER_SITE_OTHER): Payer: Medicare Other

## 2010-09-11 ENCOUNTER — Encounter: Payer: Self-pay | Admitting: Internal Medicine

## 2010-09-11 DIAGNOSIS — I10 Essential (primary) hypertension: Secondary | ICD-10-CM

## 2010-09-11 DIAGNOSIS — E785 Hyperlipidemia, unspecified: Secondary | ICD-10-CM

## 2010-09-11 DIAGNOSIS — E1149 Type 2 diabetes mellitus with other diabetic neurological complication: Secondary | ICD-10-CM

## 2010-09-11 DIAGNOSIS — Z87448 Personal history of other diseases of urinary system: Secondary | ICD-10-CM

## 2010-09-11 DIAGNOSIS — N181 Chronic kidney disease, stage 1: Secondary | ICD-10-CM

## 2010-09-11 DIAGNOSIS — D518 Other vitamin B12 deficiency anemias: Secondary | ICD-10-CM

## 2010-09-11 LAB — LIPID PANEL
Cholesterol: 142 mg/dL (ref 0–200)
HDL: 40.2 mg/dL (ref >39.00)
LDL Cholesterol: 68 mg/dL (ref 0–99)
Triglycerides: 170 mg/dL — ABNORMAL HIGH (ref 0.0–149.0)
VLDL: 34 mg/dL (ref 0.0–40.0)

## 2010-09-11 LAB — COMPREHENSIVE METABOLIC PANEL
ALT: 17 U/L (ref 0–53)
AST: 17 U/L (ref 0–37)
Albumin: 4 g/dL (ref 3.5–5.2)
Alkaline Phosphatase: 83 U/L (ref 39–117)
Calcium: 9.5 mg/dL (ref 8.4–10.5)
Chloride: 112 mEq/L (ref 96–112)
Potassium: 4.6 mEq/L (ref 3.5–5.1)
Sodium: 144 mEq/L (ref 135–145)

## 2010-09-11 LAB — CBC WITH DIFFERENTIAL/PLATELET
Basophils Absolute: 0 10*3/uL (ref 0.0–0.1)
Eosinophils Absolute: 0.2 10*3/uL (ref 0.0–0.7)
Lymphocytes Relative: 26.4 % (ref 12.0–46.0)
MCHC: 32.9 g/dL (ref 30.0–36.0)
MCV: 90.9 fl (ref 78.0–100.0)
Monocytes Absolute: 0.7 10*3/uL (ref 0.1–1.0)
Neutrophils Relative %: 62.5 % (ref 43.0–77.0)
Platelets: 99 10*3/uL — ABNORMAL LOW (ref 150.0–400.0)
RBC: 4.39 Mil/uL (ref 4.22–5.81)
RDW: 15.3 % — ABNORMAL HIGH (ref 11.5–14.6)

## 2010-09-11 LAB — HEMOGLOBIN A1C: Hgb A1c MFr Bld: 10.4 % — ABNORMAL HIGH (ref 4.6–6.5)

## 2010-09-11 LAB — URINALYSIS, ROUTINE W REFLEX MICROSCOPIC
Bilirubin Urine: NEGATIVE
Ketones, ur: NEGATIVE
Leukocytes, UA: NEGATIVE
Nitrite: NEGATIVE
Specific Gravity, Urine: 1.02 (ref 1.000–1.030)
Total Protein, Urine: NEGATIVE
pH: 6 (ref 5.0–8.0)

## 2010-09-11 NOTE — Patient Instructions (Signed)
Diabetes, Type 2 Diabetes is a lasting (chronic) disease. In type 2 diabetes, the pancreas does not make enough insulin (a hormone), and the body does not respond normally to the insulin that is made. This type of diabetes was also previously called adult onset diabetes. About 90% of all those who have diabetes have type 2. It usually occurs after the age of 40 but can occur at any age. CAUSES Unlike type 1 diabetes, which happens because insulin is no longer being made, type 2 diabetes happens because the body is making less insulin and has trouble using the insulin properly. SYMPTOMS  Drinking more than usual.   Urinating more than usual.   Blurred vision.   Dry, itchy skin.   Frequent infection like yeast infections in women.   More tired than usual (fatigue).  TREATMENT  Healthy eating.   Exercise.   Medication, if needed.   Monitoring blood glucose (sugar).   Seeing your caregiver regularly.  HOME CARE INSTRUCTIONS  Check your blood glucose (sugar) at least once daily. More frequent monitoring may be necessary, depending on your medications and on how well your diabetes is controlled. Your caregiver will advise you.   Take your medicine as directed by your caregiver.   Do not smoke.   Make wise food choices. Ask your caregiver for information. Weight loss can improve your diabetes.   Learn about low blood glucose (hypoglycemia) and how to treat it.   Get your eyes checked regularly.   Have a yearly physical exam. Have your blood pressure checked. Get your blood and urine tested.   Wear a pendant or bracelet saying that you have diabetes.   Check your feet every night for sores. Let your caregiver know if you have sores that are not healing.  SEEK MEDICAL CARE IF:  You are having problems keeping your blood glucose at target range.   You feel you might be having problems with your medicines.   You have symptoms of an illness that is not improving after 24  hours.   You have a sore or wound that is not healing.   You notice a change in vision or a new problem with your vision.   You develop a fever of more than 100.5.  Document Released: 12/24/2004 Document Re-Released: 01/15/2009 [employer] Patient Information 2011 [employer]. 

## 2010-09-11 NOTE — Progress Notes (Signed)
Subjective:    Patient ID: Robert Clayton, male    DOB: 1930-01-22, 77 y.o.   MRN: [id number]  Diabetes He presents for his follow-up diabetic visit. He has type 2 diabetes mellitus. His disease course has been stable. There are no hypoglycemic associated symptoms. Pertinent negatives for hypoglycemia include no dizziness, headaches, pallor, seizures, speech difficulty or tremors. Pertinent negatives for diabetes include no blurred vision, no chest pain, no fatigue, no foot paresthesias, no foot ulcerations, no polydipsia, no polyphagia, no polyuria, no visual change, no weakness and no weight loss. There are no hypoglycemic complications. Symptoms are stable. There are no diabetic complications. Current diabetic treatment includes oral agent (dual therapy) and intensive insulin program. He is compliant with treatment all of the time. His weight is stable. He is following a generally healthy diet. Meal planning includes avoidance of concentrated sweets. He has not had a previous visit with a dietician. He never participates in exercise. There is no change in his home blood glucose trend. An ACE inhibitor/angiotensin II receptor blocker is being taken. He does not see a podiatrist.Eye exam is current.      Review of Systems  Constitutional: Negative for fever, chills, weight loss, diaphoresis, activity change, appetite change, fatigue and unexpected weight change.  HENT: Negative.   Eyes: Negative.  Negative for blurred vision.  Respiratory: Negative for apnea, cough, choking, chest tightness, shortness of breath, wheezing and stridor.   Cardiovascular: Negative for chest pain, palpitations and leg swelling.  Gastrointestinal: Negative for nausea, vomiting, abdominal pain, diarrhea, constipation, blood in stool, abdominal distention and anal bleeding.  Genitourinary: Negative for dysuria, urgency, polyuria, frequency, hematuria, flank pain, decreased urine volume, enuresis and difficulty urinating.    Musculoskeletal: Negative for myalgias, back pain, joint swelling, arthralgias and gait problem.  Skin: Negative for color change, pallor, rash and wound.  Neurological: Negative for dizziness, tremors, seizures, syncope, facial asymmetry, speech difficulty, weakness, light-headedness, numbness and headaches.  Hematological: Negative for polydipsia, polyphagia and adenopathy. Does not bruise/bleed easily.  Psychiatric/Behavioral: Negative.        Objective:   Physical Exam  Vitals reviewed. Constitutional: He is oriented to person, place, and time. He appears well-developed and well-nourished. No distress.  HENT:  Mouth/Throat: Oropharynx is clear and moist. No oropharyngeal exudate.  Eyes: Conjunctivae are normal. Right eye exhibits no discharge. Left eye exhibits no discharge. No scleral icterus.  Neck: Normal range of motion. Neck supple. No JVD present. No tracheal deviation present. No thyromegaly present.  Cardiovascular: Normal rate, regular rhythm, normal heart sounds and intact distal pulses.  Exam reveals no gallop and no friction rub.   No murmur heard. Pulmonary/Chest: Effort normal and breath sounds normal. No stridor. No respiratory distress. He has no wheezes. He has no rales. He exhibits no tenderness.  Abdominal: Soft. Bowel sounds are normal. He exhibits no distension and no mass. There is no tenderness. There is no rebound and no guarding.  Musculoskeletal: Normal range of motion. He exhibits no edema and no tenderness.  Lymphadenopathy:    He has no cervical adenopathy.  Neurological: He is alert and oriented to person, place, and time. He has normal reflexes. He displays normal reflexes. No cranial nerve deficit. He exhibits normal muscle tone. Coordination normal.  Skin: Skin is warm and dry. No rash noted. He is not diaphoretic. No erythema. No pallor.  Psychiatric: He has a normal mood and affect. His behavior is normal. Judgment and thought content normal.       Lab Results  Component Value Date   WBC 7.3 06/26/2010   HGB 12.5* 06/26/2010   HCT 37.3* 06/26/2010   PLT 99.0* 06/26/2010   CHOL 152 06/26/2010   TRIG 243.0* 06/26/2010   HDL 32.10* 06/26/2010   LDLDIRECT 80.6 06/26/2010   ALT 24 06/26/2010   AST 19 06/26/2010   NA 141 07/12/2010   K 4.6 07/12/2010   CL 114* 07/12/2010   CREATININE 2.2* 07/12/2010   BUN 39* 07/12/2010   CO2 23 07/12/2010   TSH 1.29 06/26/2010   PSA 0.03* 06/29/2009   INR 1.1 04/08/2008   HGBA1C 11.0* 06/26/2010   MICROALBUR 1.7 03/23/2008      Assessment & Plan:

## 2010-09-12 ENCOUNTER — Encounter: Payer: Self-pay | Admitting: Internal Medicine

## 2010-09-13 ENCOUNTER — Encounter: Payer: Self-pay | Admitting: Internal Medicine

## 2010-09-13 ENCOUNTER — Other Ambulatory Visit: Payer: Medicare Other | Admitting: Gastroenterology

## 2010-09-13 NOTE — Assessment & Plan Note (Signed)
BP is well controlled, will check lytes and renal function today

## 2010-09-13 NOTE — Assessment & Plan Note (Signed)
CBC today.  

## 2010-09-13 NOTE — Assessment & Plan Note (Signed)
He has no s/s, today I will recheck his UA

## 2010-09-13 NOTE — Assessment & Plan Note (Signed)
I will continue to monitor his renal function and will avoid nephrotoxic drugs

## 2010-09-13 NOTE — Assessment & Plan Note (Signed)
I will check his A1C today to see how well his blood sugar is controlled

## 2010-09-22 ENCOUNTER — Other Ambulatory Visit: Payer: Self-pay | Admitting: Internal Medicine

## 2010-09-27 ENCOUNTER — Ambulatory Visit: Payer: Medicare Other

## 2010-10-08 LAB — GLUCOSE, CAPILLARY
Glucose-Capillary: 170 — ABNORMAL HIGH
Glucose-Capillary: 207 — ABNORMAL HIGH
Glucose-Capillary: 89

## 2010-10-08 LAB — POCT I-STAT 4, (NA,K, GLUC, HGB,HCT): Glucose, Bld: 95

## 2010-10-15 ENCOUNTER — Ambulatory Visit (INDEPENDENT_AMBULATORY_CARE_PROVIDER_SITE_OTHER): Payer: Medicare Other | Admitting: Internal Medicine

## 2010-10-15 ENCOUNTER — Other Ambulatory Visit (INDEPENDENT_AMBULATORY_CARE_PROVIDER_SITE_OTHER): Payer: Medicare Other

## 2010-10-15 ENCOUNTER — Encounter: Payer: Self-pay | Admitting: Internal Medicine

## 2010-10-15 VITALS — BP 120/64 | HR 65 | Temp 98.2°F | Resp 16 | Wt 222.0 lb

## 2010-10-15 DIAGNOSIS — E1149 Type 2 diabetes mellitus with other diabetic neurological complication: Secondary | ICD-10-CM

## 2010-10-15 DIAGNOSIS — I10 Essential (primary) hypertension: Secondary | ICD-10-CM

## 2010-10-15 LAB — HEMOGLOBIN A1C: Hgb A1c MFr Bld: 10.9 % — ABNORMAL HIGH (ref 4.6–6.5)

## 2010-10-15 NOTE — Assessment & Plan Note (Signed)
I will check his A1C today and see if his blood sugars are well controlled

## 2010-10-15 NOTE — Progress Notes (Signed)
Subjective:    Patient ID: Robert Clayton, male    DOB: 16-Aug-1930, 77 y.o.   MRN: [id number]  Diabetes He presents for his follow-up diabetic visit. He has type 2 diabetes mellitus. His disease course has been worsening. There are no hypoglycemic associated symptoms. Pertinent negatives for diabetes include no blurred vision, no chest pain, no fatigue, no foot paresthesias, no foot ulcerations, no polydipsia, no polyphagia, no polyuria, no visual change, no weakness and no weight loss. There are no hypoglycemic complications. Symptoms are stable. There are no diabetic complications. Current diabetic treatment includes oral agent (dual therapy) and intensive insulin program. He is compliant with treatment all of the time. His weight is stable. He is following a generally healthy diet. Meal planning includes avoidance of concentrated sweets. He has not had a previous visit with a dietician. He never participates in exercise. There is no change in his home blood glucose trend. His breakfast blood glucose range is generally 110-130 mg/dl. His lunch blood glucose range is generally 130-140 mg/dl. His dinner blood glucose range is generally 130-140 mg/dl. His highest blood glucose is 140-180 mg/dl. His overall blood glucose range is 130-140 mg/dl. An ACE inhibitor/angiotensin II receptor blocker is being taken. He does not see a podiatrist.Eye exam is current.      Review of Systems  Constitutional: Negative for fever, chills, weight loss, diaphoresis, activity change, appetite change, fatigue and unexpected weight change.  HENT: Negative.   Eyes: Negative.  Negative for blurred vision.  Respiratory: Negative for apnea, cough, choking, chest tightness, shortness of breath, wheezing and stridor.   Cardiovascular: Negative for chest pain, palpitations and leg swelling.  Gastrointestinal: Negative.   Genitourinary: Negative.  Negative for polyuria.  Musculoskeletal: Negative.   Skin: Negative.     Neurological: Negative.  Negative for weakness.  Hematological: Negative.  Negative for polydipsia and polyphagia.  Psychiatric/Behavioral: Negative.        Objective:   Physical Exam  Vitals reviewed. Constitutional: He is oriented to person, place, and time. He appears well-developed and well-nourished. No distress.  HENT:  Head: Normocephalic and atraumatic.  Mouth/Throat: Oropharynx is clear and moist. No oropharyngeal exudate.  Eyes: Conjunctivae are normal. Right eye exhibits no discharge. Left eye exhibits no discharge. No scleral icterus.  Neck: Normal range of motion. Neck supple. No JVD present. No tracheal deviation present. No thyromegaly present.  Cardiovascular: Normal rate, regular rhythm, normal heart sounds and intact distal pulses.  Exam reveals no gallop and no friction rub.   No murmur heard. Pulmonary/Chest: Effort normal and breath sounds normal. No stridor. No respiratory distress. He has no wheezes. He has no rales. He exhibits no tenderness.  Abdominal: Soft. Bowel sounds are normal. He exhibits no distension and no mass. There is no tenderness. There is no rebound and no guarding.  Musculoskeletal: Normal range of motion. He exhibits no edema.  Lymphadenopathy:    He has no cervical adenopathy.  Neurological: He is oriented to person, place, and time. He displays normal reflexes. No cranial nerve deficit. He exhibits normal muscle tone. Coordination normal.  Skin: Skin is warm and dry. No rash noted. He is not diaphoretic. No erythema. No pallor.  Psychiatric: He has a normal mood and affect. His behavior is normal. Judgment and thought content normal.      Lab Results  Component Value Date   WBC 7.6 09/11/2010   HGB 13.1 09/11/2010   HCT 39.9 09/11/2010   PLT 99.0 Repeated and verified X2.* 09/11/2010  GLUCOSE 222* 09/11/2010   CHOL 142 09/11/2010   TRIG 170.0* 09/11/2010   HDL 40.20 09/11/2010   LDLDIRECT 80.6 06/26/2010   LDLCALC 68 09/11/2010   ALT 17 09/11/2010    AST 17 09/11/2010   NA 144 09/11/2010   K 4.6 09/11/2010   CL 112 09/11/2010   CREATININE 1.8* 09/11/2010   BUN 31* 09/11/2010   CO2 24 09/11/2010   TSH 1.85 09/11/2010   PSA 0.03* 06/29/2009   INR 1.1 04/08/2008   HGBA1C 10.4* 09/11/2010   MICROALBUR 1.7 03/23/2008      Assessment & Plan:

## 2010-10-15 NOTE — Assessment & Plan Note (Signed)
His BP is well controlled,

## 2010-10-15 NOTE — Patient Instructions (Signed)
Diabetes, Type 2 Diabetes is a lasting (chronic) disease. In type 2 diabetes, the pancreas does not make enough insulin (a hormone), and the body does not respond normally to the insulin that is made. This type of diabetes was also previously called adult onset diabetes. About 90% of all those who have diabetes have type 2. It usually occurs after the age of 40 but can occur at any age. CAUSES Unlike type 1 diabetes, which happens because insulin is no longer being made, type 2 diabetes happens because the body is making less insulin and has trouble using the insulin properly. SYMPTOMS  Drinking more than usual.   Urinating more than usual.   Blurred vision.   Dry, itchy skin.   Frequent infection like yeast infections in women.   More tired than usual (fatigue).  TREATMENT  Healthy eating.   Exercise.   Medication, if needed.   Monitoring blood glucose (sugar).   Seeing your caregiver regularly.  HOME CARE INSTRUCTIONS  Check your blood glucose (sugar) at least once daily. More frequent monitoring may be necessary, depending on your medications and on how well your diabetes is controlled. Your caregiver will advise you.   Take your medicine as directed by your caregiver.   Do not smoke.   Make wise food choices. Ask your caregiver for information. Weight loss can improve your diabetes.   Learn about low blood glucose (hypoglycemia) and how to treat it.   Get your eyes checked regularly.   Have a yearly physical exam. Have your blood pressure checked. Get your blood and urine tested.   Wear a pendant or bracelet saying that you have diabetes.   Check your feet every night for sores. Let your caregiver know if you have sores that are not healing.  SEEK MEDICAL CARE IF:  You are having problems keeping your blood glucose at target range.   You feel you might be having problems with your medicines.   You have symptoms of an illness that is not improving after 24  hours.   You have a sore or wound that is not healing.   You notice a change in vision or a new problem with your vision.   You develop a fever of more than 100.5.  Document Released: 12/24/2004 Document Re-Released: 01/15/2009 [employer] Patient Information 2011 [employer]. 

## 2010-10-19 ENCOUNTER — Other Ambulatory Visit: Payer: Self-pay | Admitting: Internal Medicine

## 2010-10-23 LAB — HM DIABETES EYE EXAM

## 2010-10-29 ENCOUNTER — Ambulatory Visit (INDEPENDENT_AMBULATORY_CARE_PROVIDER_SITE_OTHER): Payer: Medicare Other | Admitting: *Deleted

## 2010-10-29 DIAGNOSIS — D518 Other vitamin B12 deficiency anemias: Secondary | ICD-10-CM

## 2010-10-29 MED ORDER — CYANOCOBALAMIN 1000 MCG/ML IJ SOLN
1000.0000 ug | Freq: Once | INTRAMUSCULAR | Status: AC
  Administered 2010-10-29: 1000 ug via INTRAMUSCULAR

## 2010-11-28 ENCOUNTER — Ambulatory Visit (INDEPENDENT_AMBULATORY_CARE_PROVIDER_SITE_OTHER): Payer: Medicare Other | Admitting: *Deleted

## 2010-11-28 DIAGNOSIS — D518 Other vitamin B12 deficiency anemias: Secondary | ICD-10-CM

## 2010-11-28 DIAGNOSIS — D519 Vitamin B12 deficiency anemia, unspecified: Secondary | ICD-10-CM

## 2010-11-28 MED ORDER — CYANOCOBALAMIN 1000 MCG/ML IJ SOLN
1000.0000 ug | Freq: Once | INTRAMUSCULAR | Status: AC
  Administered 2010-11-28: 1000 ug via INTRAMUSCULAR

## 2010-12-07 ENCOUNTER — Other Ambulatory Visit: Payer: Self-pay | Admitting: Internal Medicine

## 2011-01-02 ENCOUNTER — Other Ambulatory Visit: Payer: Self-pay | Admitting: Internal Medicine

## 2011-01-03 ENCOUNTER — Ambulatory Visit: Payer: Medicare Other | Admitting: *Deleted

## 2011-01-03 DIAGNOSIS — D518 Other vitamin B12 deficiency anemias: Secondary | ICD-10-CM

## 2011-01-03 MED ORDER — CYANOCOBALAMIN 1000 MCG/ML IJ SOLN
1000.0000 ug | Freq: Once | INTRAMUSCULAR | Status: AC
  Administered 2011-01-03: 1000 ug via INTRAMUSCULAR

## 2011-01-11 ENCOUNTER — Ambulatory Visit: Payer: Medicare Other | Admitting: Internal Medicine

## 2011-02-04 ENCOUNTER — Ambulatory Visit (INDEPENDENT_AMBULATORY_CARE_PROVIDER_SITE_OTHER): Payer: Medicare Other | Admitting: *Deleted

## 2011-02-04 DIAGNOSIS — D518 Other vitamin B12 deficiency anemias: Secondary | ICD-10-CM

## 2011-02-05 MED ORDER — CYANOCOBALAMIN 1000 MCG/ML IJ SOLN
1000.0000 ug | Freq: Once | INTRAMUSCULAR | Status: AC
  Administered 2011-02-04: 1000 ug via INTRAMUSCULAR

## 2011-02-05 MED ORDER — CYANOCOBALAMIN 1000 MCG/ML IJ SOLN: 1000.0000 ug | INTRAMUSCULAR | Status: DC

## 2011-02-07 ENCOUNTER — Other Ambulatory Visit: Payer: Self-pay | Admitting: Internal Medicine

## 2011-02-07 ENCOUNTER — Other Ambulatory Visit: Payer: Self-pay | Admitting: Cardiovascular Disease

## 2011-02-15 ENCOUNTER — Other Ambulatory Visit (INDEPENDENT_AMBULATORY_CARE_PROVIDER_SITE_OTHER): Payer: Medicare Other

## 2011-02-15 ENCOUNTER — Encounter: Payer: Self-pay | Admitting: Internal Medicine

## 2011-02-15 ENCOUNTER — Ambulatory Visit (INDEPENDENT_AMBULATORY_CARE_PROVIDER_SITE_OTHER): Payer: Medicare Other | Admitting: Internal Medicine

## 2011-02-15 DIAGNOSIS — I10 Essential (primary) hypertension: Secondary | ICD-10-CM

## 2011-02-15 DIAGNOSIS — B351 Tinea unguium: Secondary | ICD-10-CM

## 2011-02-15 DIAGNOSIS — E785 Hyperlipidemia, unspecified: Secondary | ICD-10-CM

## 2011-02-15 DIAGNOSIS — N181 Chronic kidney disease, stage 1: Secondary | ICD-10-CM

## 2011-02-15 DIAGNOSIS — D518 Other vitamin B12 deficiency anemias: Secondary | ICD-10-CM

## 2011-02-15 DIAGNOSIS — M109 Gout, unspecified: Secondary | ICD-10-CM

## 2011-02-15 DIAGNOSIS — E1149 Type 2 diabetes mellitus with other diabetic neurological complication: Secondary | ICD-10-CM

## 2011-02-15 DIAGNOSIS — E1142 Type 2 diabetes mellitus with diabetic polyneuropathy: Secondary | ICD-10-CM | POA: Diagnosis not present

## 2011-02-15 LAB — COMPREHENSIVE METABOLIC PANEL
BUN: 28 mg/dL — ABNORMAL HIGH (ref 6–23)
CO2: 25 mEq/L (ref 19–32)
Calcium: 9.5 mg/dL (ref 8.4–10.5)
Chloride: 112 mEq/L (ref 96–112)
Creatinine, Ser: 1.8 mg/dL — ABNORMAL HIGH (ref 0.4–1.5)
GFR: 46.55 mL/min — ABNORMAL LOW (ref >60.00)

## 2011-02-15 LAB — CBC WITH DIFFERENTIAL/PLATELET
Basophils Absolute: 0 10*3/uL (ref 0.0–0.1)
Eosinophils Relative: 1.8 % (ref 0.0–5.0)
HCT: 40 % (ref 39.0–52.0)
Hemoglobin: 13.5 g/dL (ref 13.0–17.0)
Lymphs Abs: 2.4 10*3/uL (ref 0.7–4.0)
Monocytes Relative: 8.1 % (ref 3.0–12.0)
Neutro Abs: 5.3 10*3/uL (ref 1.4–7.7)
RDW: 15.4 % — ABNORMAL HIGH (ref 11.5–14.6)

## 2011-02-15 LAB — VITAMIN B12: Vitamin B-12: 1020 pg/mL — ABNORMAL HIGH (ref 211–911)

## 2011-02-15 LAB — FOLATE: Folate: 7 ng/mL (ref >5.9)

## 2011-02-15 LAB — LIPID PANEL
Total CHOL/HDL Ratio: 4
Triglycerides: 146 mg/dL (ref 0.0–149.0)

## 2011-02-15 MED ORDER — INSULIN LISPRO 100 UNIT/ML ~~LOC~~ SOLN: 5.0000 [IU] | Freq: Three times a day (TID) | SUBCUTANEOUS | Status: DC

## 2011-02-15 MED ORDER — INSULIN PEN NEEDLE 32G X 4 MM MISC: 1.0000 | Freq: Three times a day (TID) | Status: DC

## 2011-02-15 MED ORDER — INSULIN GLARGINE 100 UNIT/ML ~~LOC~~ SOLN: 50.0000 [IU] | Freq: Two times a day (BID) | SUBCUTANEOUS | Status: DC

## 2011-02-15 NOTE — Assessment & Plan Note (Signed)
His BP is well controlled, I will check his lytes and renal function today 

## 2011-02-15 NOTE — Assessment & Plan Note (Signed)
He is tolerating his current statin well, he may need treatment for trigs, I will recheck his FLP today

## 2011-02-15 NOTE — Assessment & Plan Note (Signed)
Podiatry referral

## 2011-02-15 NOTE — Assessment & Plan Note (Signed)
For a CBC, B12, folate levels today

## 2011-02-15 NOTE — Assessment & Plan Note (Signed)
Blood sugars are too too high so I have added lispro and increased his lantus dose, I don't think he can add anymore oral agents due to edema and renal insuff., I have asked him to see ENDO for further eval and treatment and to see a diabetic educator, I also want him to see a podiatrist

## 2011-02-15 NOTE — Assessment & Plan Note (Signed)
BMP today

## 2011-02-15 NOTE — Patient Instructions (Signed)

## 2011-02-15 NOTE — Progress Notes (Signed)
Subjective:    Patient ID: Robert Clayton, male    DOB: 10-20-1930, 78 y.o.   MRN: [id number]  Diabetes He presents for his follow-up diabetic visit. He has type 2 diabetes mellitus. His disease course has been worsening. There are no hypoglycemic associated symptoms. Pertinent negatives for hypoglycemia include no dizziness, headaches, seizures, speech difficulty or tremors. Associated symptoms include polydipsia, polyphagia and polyuria. Pertinent negatives for diabetes include no blurred vision, no chest pain, no fatigue, no foot paresthesias, no visual change, no weakness and no weight loss. There are no hypoglycemic complications. Symptoms are stable. There are no diabetic complications. Current diabetic treatment includes oral agent (dual therapy) and intensive insulin program. He is compliant with treatment all of the time. His weight is stable. He is following a generally healthy diet. Meal planning includes avoidance of concentrated sweets. He has not had a previous visit with a dietician. He never participates in exercise. His home blood glucose trend is increasing steadily. His breakfast blood glucose range is generally 140-180 mg/dl. His lunch blood glucose range is generally 180-200 mg/dl. His dinner blood glucose range is generally >200 mg/dl. His highest blood glucose is >200 mg/dl. His overall blood glucose range is 180-200 mg/dl. An ACE inhibitor/angiotensin II receptor blocker is being taken. He does not see a podiatrist.Eye exam is current.  Hyperlipidemia Pertinent negatives include no chest pain or shortness of breath.  Hypertension Pertinent negatives include no blurred vision, chest pain, headaches or shortness of breath.      Review of Systems  Constitutional: Negative.  Negative for fever, chills, weight loss, diaphoresis, activity change, appetite change, fatigue and unexpected weight change.  HENT: Negative.   Eyes: Negative.  Negative for blurred vision.  Respiratory:  Negative.  Negative for cough, shortness of breath, wheezing and stridor.   Cardiovascular: Negative.  Negative for chest pain and leg swelling.  Gastrointestinal: Negative.   Genitourinary: Positive for polyuria.  Musculoskeletal: Negative.   Skin: Negative.   Neurological: Negative for dizziness, tremors, seizures, syncope, facial asymmetry, speech difficulty, weakness, light-headedness, numbness and headaches.  Hematological: Positive for polydipsia and polyphagia.  Psychiatric/Behavioral: Negative.        Objective:   Physical Exam  Vitals reviewed. Constitutional: He is oriented to person, place, and time. He appears well-developed and well-nourished. No distress.  HENT:  Head: Normocephalic and atraumatic.  Mouth/Throat: Oropharynx is clear and moist. No oropharyngeal exudate.  Eyes: Conjunctivae are normal. Right eye exhibits no discharge. Left eye exhibits no discharge. No scleral icterus.  Neck: Normal range of motion. Neck supple. No JVD present. No tracheal deviation present. No thyromegaly present.  Cardiovascular: Normal rate, regular rhythm, normal heart sounds and intact distal pulses.  Exam reveals no gallop and no friction rub.   No murmur heard. Pulmonary/Chest: Effort normal. No stridor. No respiratory distress. He has no wheezes. He has no rales. He exhibits no tenderness.  Abdominal: Soft. Bowel sounds are normal. He exhibits no distension. There is no tenderness. There is no rebound and no guarding.  Musculoskeletal: Normal range of motion. He exhibits edema (trace edema in both legs). He exhibits no tenderness.  Lymphadenopathy:    He has no cervical adenopathy.  Neurological: He is oriented to person, place, and time.  Skin: Skin is warm and dry. No rash noted. He is not diaphoretic. No erythema. No pallor.       All toenails have lysis, thickening, subungual debris but no exudate, erythema, warmth  Psychiatric: He has a normal mood and affect.  His behavior is  normal. Judgment and thought content normal.      Lab Results  Component Value Date   WBC 7.6 09/11/2010   HGB 13.1 09/11/2010   HCT 39.9 09/11/2010   PLT 99.0 Repeated and verified X2.* 09/11/2010   GLUCOSE 222* 09/11/2010   CHOL 142 09/11/2010   TRIG 170.0* 09/11/2010   HDL 40.20 09/11/2010   LDLDIRECT 80.6 06/26/2010   LDLCALC 68 09/11/2010   ALT 17 09/11/2010   AST 17 09/11/2010   NA 144 09/11/2010   K 4.6 09/11/2010   CL 112 09/11/2010   CREATININE 1.8* 09/11/2010   BUN 31* 09/11/2010   CO2 24 09/11/2010   TSH 1.85 09/11/2010   PSA 0.03* 06/29/2009   INR 1.1 04/08/2008   HGBA1C 10.9* 10/15/2010   MICROALBUR 1.7 03/23/2008      Assessment & Plan:

## 2011-02-17 ENCOUNTER — Other Ambulatory Visit: Payer: Self-pay | Admitting: Internal Medicine

## 2011-02-20 ENCOUNTER — Ambulatory Visit: Payer: Medicare Other | Admitting: Internal Medicine

## 2011-02-21 ENCOUNTER — Ambulatory Visit (INDEPENDENT_AMBULATORY_CARE_PROVIDER_SITE_OTHER): Payer: Medicare Other | Admitting: Internal Medicine

## 2011-02-21 ENCOUNTER — Encounter: Payer: Self-pay | Admitting: Internal Medicine

## 2011-02-21 VITALS — BP 108/64 | HR 60 | Temp 97.4°F | Resp 16

## 2011-02-21 DIAGNOSIS — Z87828 Personal history of other (healed) physical injury and trauma: Secondary | ICD-10-CM | POA: Diagnosis not present

## 2011-02-21 DIAGNOSIS — R519 Headache, unspecified: Secondary | ICD-10-CM | POA: Insufficient documentation

## 2011-02-21 DIAGNOSIS — R55 Syncope and collapse: Secondary | ICD-10-CM | POA: Diagnosis not present

## 2011-02-21 DIAGNOSIS — R51 Headache: Secondary | ICD-10-CM | POA: Insufficient documentation

## 2011-02-21 NOTE — Assessment & Plan Note (Signed)
Needs further f/up
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
## 2011-02-21 NOTE — Assessment & Plan Note (Signed)
He will not drive for the time being. I have asked him to see neuro to see if needs a seizure med or an EGG

## 2011-02-21 NOTE — Assessment & Plan Note (Signed)
He needs to get an MRI done to see if he is having structural problem s/s [facility] years ago and to see if he has a new CVA or seizure focus

## 2011-02-26 ENCOUNTER — Encounter: Payer: Self-pay | Admitting: Neurology

## 2011-03-01 ENCOUNTER — Ambulatory Visit
Admission: RE | Admit: 2011-03-01 | Discharge: 2011-03-01 | Disposition: A | Discharge status: Home / Self Care | Payer: Medicare Other | Source: Ambulatory Visit | Attending: Internal Medicine | Admitting: Internal Medicine

## 2011-03-01 DIAGNOSIS — Z87828 Personal history of other (healed) physical injury and trauma: Secondary | ICD-10-CM

## 2011-03-01 DIAGNOSIS — G319 Degenerative disease of nervous system, unspecified: Secondary | ICD-10-CM | POA: Diagnosis not present

## 2011-03-01 DIAGNOSIS — R55 Syncope and collapse: Secondary | ICD-10-CM

## 2011-03-01 DIAGNOSIS — R51 Headache: Secondary | ICD-10-CM

## 2011-03-01 DIAGNOSIS — I6200 Nontraumatic subdural hemorrhage, unspecified: Secondary | ICD-10-CM | POA: Diagnosis not present

## 2011-03-01 DIAGNOSIS — R413 Other amnesia: Secondary | ICD-10-CM | POA: Diagnosis not present

## 2011-03-04 ENCOUNTER — Ambulatory Visit: Payer: Medicare Other | Admitting: Endocrinology

## 2011-03-07 ENCOUNTER — Ambulatory Visit (INDEPENDENT_AMBULATORY_CARE_PROVIDER_SITE_OTHER): Payer: Medicare Other

## 2011-03-07 DIAGNOSIS — D518 Other vitamin B12 deficiency anemias: Secondary | ICD-10-CM | POA: Diagnosis not present

## 2011-03-07 MED ORDER — CYANOCOBALAMIN 1000 MCG/ML IJ SOLN
1000.0000 ug | Freq: Once | INTRAMUSCULAR | Status: AC
  Administered 2011-03-07: 1000 ug via INTRAMUSCULAR

## 2011-03-13 DIAGNOSIS — L608 Other nail disorders: Secondary | ICD-10-CM | POA: Diagnosis not present

## 2011-03-13 DIAGNOSIS — E119 Type 2 diabetes mellitus without complications: Secondary | ICD-10-CM | POA: Diagnosis not present

## 2011-03-13 DIAGNOSIS — I739 Peripheral vascular disease, unspecified: Secondary | ICD-10-CM | POA: Diagnosis not present

## 2011-03-14 ENCOUNTER — Ambulatory Visit (INDEPENDENT_AMBULATORY_CARE_PROVIDER_SITE_OTHER): Payer: Medicare Other | Admitting: Endocrinology

## 2011-03-14 ENCOUNTER — Encounter: Payer: Self-pay | Admitting: Endocrinology

## 2011-03-14 DIAGNOSIS — E1142 Type 2 diabetes mellitus with diabetic polyneuropathy: Secondary | ICD-10-CM | POA: Diagnosis not present

## 2011-03-14 DIAGNOSIS — E1149 Type 2 diabetes mellitus with other diabetic neurological complication: Secondary | ICD-10-CM

## 2011-03-14 DIAGNOSIS — I10 Essential (primary) hypertension: Secondary | ICD-10-CM | POA: Diagnosis not present
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
## 2011-03-14 NOTE — Patient Instructions (Addendum)
good diet and exercise habits significanly improve the control of your diabetes.  please let me know if you wish to be referred to a dietician.  high blood sugar is very risky to your health.  you should see an eye doctor every year. controlling your blood pressure and cholesterol drastically reduces the damage diabetes does to your body.  this also applies to quitting smoking.  please discuss these with your doctor.  you should take an aspirin every day, unless you have been advised by a doctor not to. check your blood sugar 2 times a day.  vary the time of day when you check, between before the 3 meals, and at bedtime.  also check if you have symptoms of your blood sugar being too high or too low.  please keep a record of the readings and bring it to your next appointment here.  please call us sooner if your blood sugar goes below 70, or if it stays over 200. Stop glipizide and tradjenta.   Reduce lantus to 40 units, 2x a day. Increase humalog to 15 units 3x a day (just before each meal) Please come back for a follow-up appointment in 2 weeks

## 2011-03-25 DIAGNOSIS — I739 Peripheral vascular disease, unspecified: Secondary | ICD-10-CM | POA: Diagnosis not present

## 2011-03-25 DIAGNOSIS — L608 Other nail disorders: Secondary | ICD-10-CM | POA: Diagnosis not present

## 2011-03-25 DIAGNOSIS — E119 Type 2 diabetes mellitus without complications: Secondary | ICD-10-CM | POA: Diagnosis not present

## 2011-03-28 ENCOUNTER — Ambulatory Visit: Payer: Medicare Other | Admitting: Dietician

## 2011-04-04 ENCOUNTER — Encounter: Payer: Self-pay | Admitting: Endocrinology

## 2011-04-04 ENCOUNTER — Ambulatory Visit (INDEPENDENT_AMBULATORY_CARE_PROVIDER_SITE_OTHER): Payer: Medicare Other | Admitting: Endocrinology

## 2011-04-04 VITALS — BP 132/80 | HR 79 | Temp 98.3°F | Ht 71.5 in | Wt 220.4 lb

## 2011-04-04 DIAGNOSIS — D518 Other vitamin B12 deficiency anemias: Secondary | ICD-10-CM

## 2011-04-04 MED ORDER — CYANOCOBALAMIN 1000 MCG/ML IJ SOLN
1000.0000 ug | Freq: Once | INTRAMUSCULAR | Status: AC
  Administered 2011-04-04: 1000 ug via INTRAMUSCULAR

## 2011-04-04 MED ORDER — INSULIN LISPRO 100 UNIT/ML ~~LOC~~ SOLN: 30.0000 [IU] | Freq: Three times a day (TID) | SUBCUTANEOUS | Status: DC

## 2011-04-04 NOTE — Patient Instructions (Addendum)
check your blood sugar 2 times a day.  vary the time of day when you check, between before the 3 meals, and at bedtime.  also check if you have symptoms of your blood sugar being too high or too low.  please keep a record of the readings and bring it to your next appointment here.  please call us sooner if your blood sugar goes below 70, or if it stays over 200. Reduce lantus to 40 units at bedtime only. Increase humalog to 30 units 3x a day (just before each meal) Please come back for a follow-up appointment in 3 weeks.
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
## 2011-04-05 ENCOUNTER — Ambulatory Visit: Payer: Medicare Other

## 2011-04-11 ENCOUNTER — Encounter: Payer: Self-pay | Admitting: Neurology

## 2011-04-11 ENCOUNTER — Ambulatory Visit (INDEPENDENT_AMBULATORY_CARE_PROVIDER_SITE_OTHER): Payer: Medicare Other | Admitting: Neurology

## 2011-04-11 VITALS — BP 150/70 | HR 84 | Ht 71.0 in | Wt 224.0 lb

## 2011-04-11 DIAGNOSIS — R569 Unspecified convulsions: Secondary | ICD-10-CM

## 2011-04-11 MED ORDER — LEVETIRACETAM 250 MG PO TABS: 500.0000 mg | ORAL_TABLET | Freq: Two times a day (BID) | ORAL | Status: DC
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
## 2011-04-11 NOTE — Patient Instructions (Addendum)
Your EEG is scheduled for Wednesday, April 10th at 2:00pm.   Please arrive to [facility], first floor admitting by 1:45pm.  [phone].  Titration to Keppra(levetiracetam) 500mg  twice a day using  Keppra 250mg  tablets.   Morning Dose Evening Dose  Week 1 0 tablets  1 tablet (250mg )  Week 2 1 tablet (250mg ) 1 tablet (250mg )  Week 3 1 tablet (250mg ) 2 tablets (500mg )  Week 4 2 tablets (500mg ) 2 tablets (500mg )    After Week 4 continue at 2 tablets twice a day. Titration requires 70 250mg  tablets.

## 2011-04-16 ENCOUNTER — Ambulatory Visit: Payer: Medicare Other | Admitting: Internal Medicine

## 2011-04-17 ENCOUNTER — Ambulatory Visit (HOSPITAL_COMMUNITY)
Admission: RE | Admit: 2011-04-17 | Discharge: 2011-04-17 | Disposition: A | Discharge status: Home / Self Care | Payer: Medicare Other | Source: Ambulatory Visit | Attending: Neurology | Admitting: Neurology

## 2011-04-17 DIAGNOSIS — R404 Transient alteration of awareness: Secondary | ICD-10-CM | POA: Insufficient documentation

## 2011-04-17 DIAGNOSIS — R9401 Abnormal electroencephalogram [EEG]: Secondary | ICD-10-CM | POA: Diagnosis not present

## 2011-04-17 DIAGNOSIS — Z1389 Encounter for screening for other disorder: Secondary | ICD-10-CM | POA: Diagnosis not present

## 2011-04-17 DIAGNOSIS — R569 Unspecified convulsions: Secondary | ICD-10-CM
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
## 2011-04-25 ENCOUNTER — Ambulatory Visit (INDEPENDENT_AMBULATORY_CARE_PROVIDER_SITE_OTHER): Payer: Medicare Other | Admitting: Endocrinology

## 2011-04-25 ENCOUNTER — Encounter: Payer: Self-pay | Admitting: Endocrinology

## 2011-04-25 VITALS — BP 142/86 | HR 70 | Temp 98.3°F | Ht 71.5 in | Wt 223.8 lb

## 2011-04-25 DIAGNOSIS — E1149 Type 2 diabetes mellitus with other diabetic neurological complication: Secondary | ICD-10-CM | POA: Diagnosis not present

## 2011-04-25 DIAGNOSIS — E1142 Type 2 diabetes mellitus with diabetic polyneuropathy: Secondary | ICD-10-CM | POA: Diagnosis not present

## 2011-04-25 NOTE — Patient Instructions (Addendum)
check your blood sugar 2 times a day.  vary the time of day when you check, between before the 3 meals, and at bedtime.  also check if you have symptoms of your blood sugar being too high or too low.  please keep a record of the readings and bring it to your next appointment here.  please call us sooner if your blood sugar goes below 70, or if it stays over 200. continue lantus 40 units at bedtime only. change humalog to 3x a day (just before each meal) 25-30-35 units.   Please come back for a follow-up appointment in 1 month.
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
## 2011-05-06 ENCOUNTER — Encounter: Payer: Self-pay | Admitting: Internal Medicine

## 2011-05-06 ENCOUNTER — Ambulatory Visit: Payer: Medicare Other

## 2011-05-06 ENCOUNTER — Ambulatory Visit (INDEPENDENT_AMBULATORY_CARE_PROVIDER_SITE_OTHER): Payer: Medicare Other | Admitting: Internal Medicine

## 2011-05-06 VITALS — BP 110/64 | HR 56 | Temp 98.1°F | Resp 16 | Wt 221.0 lb

## 2011-05-06 DIAGNOSIS — E1149 Type 2 diabetes mellitus with other diabetic neurological complication: Secondary | ICD-10-CM

## 2011-05-06 DIAGNOSIS — E1142 Type 2 diabetes mellitus with diabetic polyneuropathy: Secondary | ICD-10-CM

## 2011-05-06 DIAGNOSIS — I10 Essential (primary) hypertension: Secondary | ICD-10-CM

## 2011-05-06 DIAGNOSIS — D518 Other vitamin B12 deficiency anemias: Secondary | ICD-10-CM

## 2011-05-06 MED ORDER — CYANOCOBALAMIN 1000 MCG/ML IJ SOLN
1000.0000 ug | Freq: Once | INTRAMUSCULAR | Status: AC
  Administered 2011-05-06: 1000 ug via INTRAMUSCULAR

## 2011-05-06 MED ORDER — OLMESARTAN MEDOXOMIL 20 MG PO TABS: 20.0000 mg | ORAL_TABLET | Freq: Every day | ORAL | Status: DC

## 2011-05-06 NOTE — Assessment & Plan Note (Signed)
He is doing well on his new insulin regimen

## 2011-05-06 NOTE — Patient Instructions (Signed)

## 2011-05-06 NOTE — Progress Notes (Signed)
  Subjective:    Patient ID: Robert Clayton, male    DOB: 1930/10/25, 78 y.o.   MRN: [id number]  Hypertension This is a chronic problem. The current episode started more than 1 year ago. The problem has been gradually improving since onset. The problem is controlled. Pertinent negatives include no anxiety, blurred vision, chest pain, headaches, malaise/fatigue, neck pain, orthopnea, palpitations, peripheral edema, PND, shortness of breath or sweats. Past treatments include angiotensin blockers, calcium channel blockers and beta blockers. The current treatment provides significant improvement. Compliance problems include medication side effects (he feels like amlodipine causes a headache).  Hypertensive end-organ damage includes CAD/MI.      Review of Systems  Constitutional: Negative for fever, chills, malaise/fatigue, diaphoresis, activity change, appetite change, fatigue and unexpected weight change.  HENT: Negative.  Negative for neck pain.   Eyes: Negative.  Negative for blurred vision.  Respiratory: Negative for cough, chest tightness, shortness of breath, wheezing and stridor.   Cardiovascular: Negative for chest pain, palpitations, orthopnea and PND.  Gastrointestinal: Negative.   Genitourinary: Negative.   Musculoskeletal: Negative for myalgias, back pain, joint swelling, arthralgias and gait problem.  Skin: Negative.   Neurological: Negative for dizziness, tremors, seizures, syncope, facial asymmetry, speech difficulty, weakness, light-headedness, numbness and headaches.  Hematological: Negative for adenopathy. Does not bruise/bleed easily.  Psychiatric/Behavioral: Negative.        Objective:   Physical Exam  Vitals reviewed. Constitutional: He is oriented to person, place, and time. He appears well-developed and well-nourished. No distress.  HENT:  Head: Normocephalic and atraumatic.  Mouth/Throat: Oropharynx is clear and moist. No oropharyngeal exudate.  Eyes: Conjunctivae  are normal. Right eye exhibits no discharge. Left eye exhibits no discharge. No scleral icterus.  Neck: Normal range of motion. Neck supple. No JVD present. No tracheal deviation present. No thyromegaly present.  Cardiovascular: Normal rate, regular rhythm, normal heart sounds and intact distal pulses.  Exam reveals no gallop and no friction rub.   No murmur heard. Pulmonary/Chest: Effort normal and breath sounds normal. No stridor. No respiratory distress. He has no wheezes. He has no rales. He exhibits no tenderness.  Abdominal: Soft. Bowel sounds are normal. He exhibits no distension and no mass. There is no tenderness. There is no rebound and no guarding.  Musculoskeletal: Normal range of motion. He exhibits no edema and no tenderness.  Lymphadenopathy:    He has no cervical adenopathy.  Neurological: He is oriented to person, place, and time.  Skin: Skin is warm and dry. No rash noted. He is not diaphoretic. No erythema. No pallor.  Psychiatric: He has a normal mood and affect. His behavior is normal. Judgment and thought content normal.      Lab Results  Component Value Date   WBC 8.6 02/15/2011   HGB 13.5 02/15/2011   HCT 40.0 02/15/2011   PLT 94.0* 02/15/2011   GLUCOSE 111* 02/15/2011   CHOL 141 02/15/2011   TRIG 146.0 02/15/2011   HDL 38.10* 02/15/2011   LDLDIRECT 80.6 06/26/2010   LDLCALC 74 02/15/2011   ALT 17 02/15/2011   AST 16 02/15/2011   NA 144 02/15/2011   K 4.3 02/15/2011   CL 112 02/15/2011   CREATININE 1.8* 02/15/2011   BUN 28* 02/15/2011   CO2 25 02/15/2011   TSH 1.68 02/15/2011   PSA 0.03* 06/29/2009   INR 1.1 04/08/2008   HGBA1C 10.7* 02/15/2011   MICROALBUR 1.7 03/23/2008      Assessment & Plan:

## 2011-05-06 NOTE — Assessment & Plan Note (Signed)
He will stop the amlodipine and will take benicar for BP control and renal protection from DM

## 2011-05-27 ENCOUNTER — Encounter: Payer: Self-pay | Admitting: Endocrinology

## 2011-05-27 ENCOUNTER — Telehealth: Payer: Self-pay | Admitting: *Deleted

## 2011-05-27 ENCOUNTER — Ambulatory Visit (INDEPENDENT_AMBULATORY_CARE_PROVIDER_SITE_OTHER): Payer: Medicare Other | Admitting: Endocrinology

## 2011-05-27 ENCOUNTER — Other Ambulatory Visit (INDEPENDENT_AMBULATORY_CARE_PROVIDER_SITE_OTHER): Payer: Medicare Other

## 2011-05-27 VITALS — BP 118/68 | HR 66 | Temp 98.5°F | Ht 71.0 in | Wt 224.0 lb

## 2011-05-27 DIAGNOSIS — E1142 Type 2 diabetes mellitus with diabetic polyneuropathy: Secondary | ICD-10-CM

## 2011-05-27 DIAGNOSIS — E1149 Type 2 diabetes mellitus with other diabetic neurological complication: Secondary | ICD-10-CM

## 2011-05-27 NOTE — Patient Instructions (Addendum)
check your blood sugar 2 times a day.  vary the time of day when you check, between before the 3 meals, and at bedtime.  also check if you have symptoms of your blood sugar being too high or too low.  please keep a record of the readings and bring it to your next appointment here.  please call us sooner if your blood sugar goes below 70, or if it stays over 200. continue lantus 40 units at bedtime only. continue humalog 3x a day (just before each meal) 25-30-35 units.   Please come back for a follow-up appointment in 3 months.

## 2011-05-27 NOTE — Telephone Encounter (Signed)
Called pt to inform of lab results, pt informed of lab results (letter also mailed to pt).
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
## 2011-06-05 ENCOUNTER — Ambulatory Visit (INDEPENDENT_AMBULATORY_CARE_PROVIDER_SITE_OTHER): Payer: Medicare Other

## 2011-06-05 DIAGNOSIS — D518 Other vitamin B12 deficiency anemias: Secondary | ICD-10-CM | POA: Diagnosis not present

## 2011-06-05 MED ORDER — CYANOCOBALAMIN 1000 MCG/ML IJ SOLN
1000.0000 ug | Freq: Once | INTRAMUSCULAR | Status: AC
  Administered 2011-06-05: 1000 ug via INTRAMUSCULAR

## 2011-06-19 ENCOUNTER — Encounter: Payer: Self-pay | Admitting: Neurology

## 2011-06-19 ENCOUNTER — Ambulatory Visit (INDEPENDENT_AMBULATORY_CARE_PROVIDER_SITE_OTHER): Payer: Medicare Other | Admitting: Neurology

## 2011-06-19 VITALS — BP 126/70 | HR 64 | Wt 225.0 lb

## 2011-06-19 DIAGNOSIS — R569 Unspecified convulsions: Secondary | ICD-10-CM

## 2011-06-19 MED ORDER — LEVETIRACETAM 500 MG PO TABS: 500.0000 mg | ORAL_TABLET | Freq: Two times a day (BID) | ORAL | Status: DC
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
## 2011-07-02 ENCOUNTER — Ambulatory Visit (INDEPENDENT_AMBULATORY_CARE_PROVIDER_SITE_OTHER): Payer: Medicare Other | Admitting: Internal Medicine

## 2011-07-02 ENCOUNTER — Encounter: Payer: Self-pay | Admitting: Internal Medicine

## 2011-07-02 VITALS — BP 118/64 | HR 58 | Temp 98.4°F | Resp 16 | Wt 224.0 lb

## 2011-07-02 DIAGNOSIS — I10 Essential (primary) hypertension: Secondary | ICD-10-CM

## 2011-07-02 DIAGNOSIS — D518 Other vitamin B12 deficiency anemias: Secondary | ICD-10-CM

## 2011-07-02 MED ORDER — CYANOCOBALAMIN 1000 MCG/ML IJ SOLN
1000.0000 ug | Freq: Once | INTRAMUSCULAR | Status: AC
  Administered 2011-07-02: 1000 ug via INTRAMUSCULAR

## 2011-07-02 NOTE — Assessment & Plan Note (Signed)
His BP is well controlled and his renal function appears stable

## 2011-07-02 NOTE — Patient Instructions (Signed)

## 2011-07-02 NOTE — Assessment & Plan Note (Signed)
Will continue monthly B12 injections

## 2011-07-02 NOTE — Progress Notes (Signed)
  Subjective:    Patient ID: Robert Clayton, male    DOB: 1930-06-17, 78 y.o.   MRN: [id number]  Hypertension This is a chronic problem. The current episode started more than 1 year ago. The problem has been gradually improving since onset. The problem is controlled. Pertinent negatives include no anxiety, blurred vision, chest pain, headaches, malaise/fatigue, neck pain, orthopnea, palpitations, peripheral edema, PND, shortness of breath or sweats. There are no associated agents to hypertension. Risk factors for coronary artery disease include sedentary lifestyle. Past treatments include ACE inhibitors and beta blockers. The current treatment provides significant improvement. Compliance problems include exercise and diet.       Review of Systems  Constitutional: Negative.  Negative for malaise/fatigue.  HENT: Negative.  Negative for neck pain.   Eyes: Negative.  Negative for blurred vision.  Respiratory: Negative for cough, chest tightness, shortness of breath, wheezing and stridor.   Cardiovascular: Negative for chest pain, palpitations, orthopnea, leg swelling and PND.  Gastrointestinal: Negative.   Genitourinary: Negative.   Musculoskeletal: Negative.   Skin: Negative.   Neurological: Negative for seizures, syncope, speech difficulty, weakness, light-headedness and headaches.  Hematological: Negative.   Psychiatric/Behavioral: Negative.        Objective:   Physical Exam  Vitals reviewed. Constitutional: He is oriented to person, place, and time. He appears well-developed and well-nourished. No distress.  HENT:  Head: Normocephalic and atraumatic.  Mouth/Throat: Oropharynx is clear and moist. No oropharyngeal exudate.  Eyes: Conjunctivae are normal. Right eye exhibits no discharge. Left eye exhibits no discharge. No scleral icterus.  Neck: Normal range of motion. Neck supple. No JVD present. No tracheal deviation present. No thyromegaly present.  Cardiovascular: Normal rate,  regular rhythm, normal heart sounds and intact distal pulses.  Exam reveals no gallop and no friction rub.   No murmur heard. Pulmonary/Chest: Effort normal and breath sounds normal. No stridor. No respiratory distress. He has no wheezes. He has no rales. He exhibits no tenderness.  Abdominal: Soft. Bowel sounds are normal. He exhibits no distension and no mass. There is no tenderness. There is no rebound and no guarding.  Musculoskeletal: Normal range of motion. He exhibits no edema and no tenderness.  Lymphadenopathy:    He has no cervical adenopathy.  Neurological: He is oriented to person, place, and time.  Skin: Skin is warm and dry. No rash noted. He is not diaphoretic. No erythema. No pallor.  Psychiatric: He has a normal mood and affect. His behavior is normal. Judgment and thought content normal.      Lab Results  Component Value Date   WBC 8.6 02/15/2011   HGB 13.5 02/15/2011   HCT 40.0 02/15/2011   PLT 94.0* 02/15/2011   GLUCOSE 111* 02/15/2011   CHOL 141 02/15/2011   TRIG 146.0 02/15/2011   HDL 38.10* 02/15/2011   LDLDIRECT 80.6 06/26/2010   LDLCALC 74 02/15/2011   ALT 17 02/15/2011   AST 16 02/15/2011   NA 144 02/15/2011   K 4.3 02/15/2011   CL 112 02/15/2011   CREATININE 1.8* 02/15/2011   BUN 28* 02/15/2011   CO2 25 02/15/2011   TSH 1.68 02/15/2011   PSA 0.03* 06/29/2009   INR 1.1 04/08/2008   HGBA1C 9.4* 05/27/2011   MICROALBUR 1.7 03/23/2008      Assessment & Plan:

## 2011-07-15 ENCOUNTER — Other Ambulatory Visit: Payer: Self-pay

## 2011-07-15 MED ORDER — SIMVASTATIN 20 MG PO TABS: ORAL_TABLET | ORAL | Status: DC

## 2011-07-15 MED ORDER — ALLOPURINOL 300 MG PO TABS: ORAL_TABLET | ORAL | Status: DC

## 2011-08-01 ENCOUNTER — Ambulatory Visit (INDEPENDENT_AMBULATORY_CARE_PROVIDER_SITE_OTHER): Payer: Medicare Other

## 2011-08-01 DIAGNOSIS — D518 Other vitamin B12 deficiency anemias: Secondary | ICD-10-CM | POA: Diagnosis not present

## 2011-08-01 MED ORDER — CYANOCOBALAMIN 1000 MCG/ML IJ SOLN
1000.0000 ug | Freq: Once | INTRAMUSCULAR | Status: AC
  Administered 2011-08-01: 1000 ug via INTRAMUSCULAR

## 2011-08-23 ENCOUNTER — Ambulatory Visit: Payer: Medicare Other | Admitting: Neurology

## 2011-09-02 ENCOUNTER — Ambulatory Visit: Payer: Medicare Other

## 2011-09-05 ENCOUNTER — Ambulatory Visit (INDEPENDENT_AMBULATORY_CARE_PROVIDER_SITE_OTHER): Payer: Medicare Other | Admitting: Endocrinology

## 2011-09-05 ENCOUNTER — Other Ambulatory Visit (INDEPENDENT_AMBULATORY_CARE_PROVIDER_SITE_OTHER): Payer: Medicare Other

## 2011-09-05 ENCOUNTER — Encounter: Payer: Self-pay | Admitting: Endocrinology

## 2011-09-05 VITALS — BP 130/62 | HR 82 | Temp 98.6°F | Ht 71.0 in | Wt 221.0 lb

## 2011-09-05 DIAGNOSIS — E1142 Type 2 diabetes mellitus with diabetic polyneuropathy: Secondary | ICD-10-CM

## 2011-09-05 DIAGNOSIS — E1149 Type 2 diabetes mellitus with other diabetic neurological complication: Secondary | ICD-10-CM

## 2011-09-05 DIAGNOSIS — D518 Other vitamin B12 deficiency anemias: Secondary | ICD-10-CM

## 2011-09-05 LAB — HEMOGLOBIN A1C: Hgb A1c MFr Bld: 9.4 % — ABNORMAL HIGH (ref 4.6–6.5)

## 2011-09-05 MED ORDER — CYANOCOBALAMIN 1000 MCG/ML IJ SOLN
1000.0000 ug | Freq: Once | INTRAMUSCULAR | Status: AC
  Administered 2011-09-05: 1000 ug via INTRAMUSCULAR

## 2011-09-05 NOTE — Patient Instructions (Addendum)
check your blood sugar 2 times a day.  vary the time of day when you check, between before the 3 meals, and at bedtime.  also check if you have symptoms of your blood sugar being too high or too low.  please keep a record of the readings and bring it to your next appointment here.  please call us sooner if your blood sugar goes below 70, or if it stays over 200.  reduce lantus to 30 units at bedtime only.  increase humalog to 40 units 3x a day (just before each meal).  Please come back for a follow-up appointment in 3 months.
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
## 2011-09-19 ENCOUNTER — Ambulatory Visit (INDEPENDENT_AMBULATORY_CARE_PROVIDER_SITE_OTHER): Payer: Medicare Other | Admitting: Neurology

## 2011-09-19 ENCOUNTER — Encounter: Payer: Self-pay | Admitting: Neurology

## 2011-09-19 VITALS — BP 106/68 | HR 60 | Wt 222.0 lb

## 2011-09-19 DIAGNOSIS — I6200 Nontraumatic subdural hemorrhage, unspecified: Secondary | ICD-10-CM

## 2011-09-19 DIAGNOSIS — R569 Unspecified convulsions: Secondary | ICD-10-CM | POA: Diagnosis not present

## 2011-09-19 DIAGNOSIS — S065X9A Traumatic subdural hemorrhage with loss of consciousness of unspecified duration, initial encounter: Secondary | ICD-10-CM

## 2011-09-19 MED ORDER — LEVETIRACETAM 500 MG PO TABS: 500.0000 mg | ORAL_TABLET | Freq: Two times a day (BID) | ORAL | Status: DC
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
## 2011-09-30 ENCOUNTER — Other Ambulatory Visit: Payer: Self-pay

## 2011-09-30 NOTE — Telephone Encounter (Signed)
Error

## 2011-10-07 ENCOUNTER — Ambulatory Visit (INDEPENDENT_AMBULATORY_CARE_PROVIDER_SITE_OTHER): Payer: Medicare Other

## 2011-10-07 DIAGNOSIS — D518 Other vitamin B12 deficiency anemias: Secondary | ICD-10-CM | POA: Diagnosis not present

## 2011-10-07 MED ORDER — CYANOCOBALAMIN 1000 MCG/ML IJ SOLN
1000.0000 ug | Freq: Once | INTRAMUSCULAR | Status: AC
  Administered 2011-10-07: 1000 ug via INTRAMUSCULAR

## 2011-10-24 ENCOUNTER — Other Ambulatory Visit: Payer: Self-pay

## 2011-10-24 MED ORDER — INSULIN GLARGINE 100 UNIT/ML ~~LOC~~ SOLN: 30.0000 [IU] | Freq: Every day | SUBCUTANEOUS | Status: DC

## 2011-10-29 DIAGNOSIS — E119 Type 2 diabetes mellitus without complications: Secondary | ICD-10-CM | POA: Diagnosis not present

## 2011-10-29 LAB — HM DIABETES EYE EXAM

## 2011-10-30 ENCOUNTER — Encounter: Payer: Self-pay | Admitting: Internal Medicine

## 2011-10-30 ENCOUNTER — Other Ambulatory Visit (INDEPENDENT_AMBULATORY_CARE_PROVIDER_SITE_OTHER): Payer: Medicare Other

## 2011-10-30 ENCOUNTER — Ambulatory Visit (INDEPENDENT_AMBULATORY_CARE_PROVIDER_SITE_OTHER): Payer: Medicare Other | Admitting: Internal Medicine

## 2011-10-30 VITALS — BP 112/58 | HR 73 | Temp 98.6°F | Resp 20 | Ht 71.5 in | Wt 223.8 lb

## 2011-10-30 DIAGNOSIS — I798 Other disorders of arteries, arterioles and capillaries in diseases classified elsewhere: Secondary | ICD-10-CM

## 2011-10-30 DIAGNOSIS — E1159 Type 2 diabetes mellitus with other circulatory complications: Secondary | ICD-10-CM | POA: Diagnosis not present

## 2011-10-30 DIAGNOSIS — I70219 Atherosclerosis of native arteries of extremities with intermittent claudication, unspecified extremity: Secondary | ICD-10-CM | POA: Diagnosis not present

## 2011-10-30 DIAGNOSIS — Z23 Encounter for immunization: Secondary | ICD-10-CM

## 2011-10-30 DIAGNOSIS — N181 Chronic kidney disease, stage 1: Secondary | ICD-10-CM

## 2011-10-30 DIAGNOSIS — I10 Essential (primary) hypertension: Secondary | ICD-10-CM | POA: Diagnosis not present

## 2011-10-30 DIAGNOSIS — D518 Other vitamin B12 deficiency anemias: Secondary | ICD-10-CM | POA: Diagnosis not present

## 2011-10-30 DIAGNOSIS — E1151 Type 2 diabetes mellitus with diabetic peripheral angiopathy without gangrene: Secondary | ICD-10-CM

## 2011-10-30 LAB — COMPREHENSIVE METABOLIC PANEL
ALT: 15 U/L (ref 0–53)
AST: 16 U/L (ref 0–37)
Albumin: 3.5 g/dL (ref 3.5–5.2)
Alkaline Phosphatase: 73 U/L (ref 39–117)
BUN: 30 mg/dL — ABNORMAL HIGH (ref 6–23)
CO2: 25 mEq/L (ref 19–32)
Calcium: 9.2 mg/dL (ref 8.4–10.5)
Chloride: 113 mEq/L — ABNORMAL HIGH (ref 96–112)
Creatinine, Ser: 2 mg/dL — ABNORMAL HIGH (ref 0.4–1.5)
GFR: 40.96 mL/min — ABNORMAL LOW (ref >60.00)
Glucose, Bld: 234 mg/dL — ABNORMAL HIGH (ref 70–99)
Potassium: 4.2 mEq/L (ref 3.5–5.1)
Sodium: 143 mEq/L (ref 135–145)
Total Bilirubin: 0.6 mg/dL (ref 0.3–1.2)
Total Protein: 6.9 g/dL (ref 6.0–8.3)

## 2011-10-30 LAB — CBC WITH DIFFERENTIAL/PLATELET
Eosinophils Relative: 1.9 % (ref 0.0–5.0)
HCT: 38.9 % — ABNORMAL LOW (ref 39.0–52.0)
Lymphs Abs: 2.3 10*3/uL (ref 0.7–4.0)
Monocytes Relative: 8.2 % (ref 3.0–12.0)
Neutrophils Relative %: 64.5 % (ref 43.0–77.0)
Platelets: 98 10*3/uL — ABNORMAL LOW (ref 150.0–400.0)
RBC: 4.17 Mil/uL — ABNORMAL LOW (ref 4.22–5.81)
WBC: 9.3 10*3/uL (ref 4.5–10.5)

## 2011-10-30 LAB — HM DIABETES FOOT EXAM

## 2011-10-30 LAB — URINALYSIS, ROUTINE W REFLEX MICROSCOPIC
Ketones, ur: NEGATIVE
Leukocytes, UA: NEGATIVE
Specific Gravity, Urine: 1.015 (ref 1.000–1.030)
Total Protein, Urine: NEGATIVE
pH: 5.5 (ref 5.0–8.0)

## 2011-10-30 MED ORDER — CYANOCOBALAMIN 1000 MCG/ML IJ SOLN
1000.0000 ug | Freq: Once | INTRAMUSCULAR | Status: AC
  Administered 2011-10-30: 1000 ug via INTRAMUSCULAR

## 2011-10-30 NOTE — Progress Notes (Signed)
Subjective:    Patient ID: Robert Clayton, male    DOB: Jul 28, 1930, 78 y.o.   MRN: [id number]  Hypertension This is a chronic problem. The current episode started more than 1 year ago. The problem has been gradually improving since onset. The problem is controlled. Pertinent negatives include no anxiety, blurred vision, chest pain, headaches, malaise/fatigue, neck pain, orthopnea, palpitations, peripheral edema, PND, shortness of breath or sweats. Past treatments include angiotensin blockers. The current treatment provides significant improvement. Compliance problems include exercise and diet.  Hypertensive end-organ damage includes kidney disease and PVD. Identifiable causes of hypertension include chronic renal disease.      Review of Systems  Constitutional: Negative for fever, chills, malaise/fatigue, diaphoresis, activity change, appetite change, fatigue and unexpected weight change.  HENT: Negative.  Negative for neck pain.   Eyes: Negative.  Negative for blurred vision.  Respiratory: Negative for cough, chest tightness, shortness of breath and wheezing.   Cardiovascular: Negative for chest pain, palpitations, orthopnea, leg swelling and PND.  Gastrointestinal: Negative.   Genitourinary: Negative.   Musculoskeletal: Negative for myalgias, back pain, joint swelling, arthralgias and gait problem.  Skin: Negative.   Neurological: Negative.  Negative for headaches.  Hematological: Negative for adenopathy. Does not bruise/bleed easily.  Psychiatric/Behavioral: Negative.        Objective:   Physical Exam  Vitals reviewed. Constitutional: He is oriented to person, place, and time. He appears well-developed and well-nourished. No distress.  HENT:  Head: Normocephalic and atraumatic.  Mouth/Throat: Oropharynx is clear and moist. No oropharyngeal exudate.  Eyes: Conjunctivae normal are normal. Right eye exhibits no discharge. Left eye exhibits no discharge. No scleral icterus.  Neck:  Normal range of motion. Neck supple. No JVD present. No tracheal deviation present. No thyromegaly present.  Cardiovascular: Normal rate, regular rhythm, S1 normal and S2 normal.  Exam reveals decreased pulses. Exam reveals no gallop, no S3, no S4, no distant heart sounds and no friction rub.   Murmur heard.  Decrescendo systolic murmur is present with a grade of 2/6  Pulses:      Carotid pulses are 1+ on the right side, and 1+ on the left side.      Radial pulses are 1+ on the right side, and 1+ on the left side.       Femoral pulses are 1+ on the right side, and 1+ on the left side.      Popliteal pulses are 0 on the right side, and 0 on the left side.       Dorsalis pedis pulses are 0 on the right side, and 0 on the left side.       Posterior tibial pulses are 0 on the right side, and 0 on the left side.  Pulmonary/Chest: Effort normal and breath sounds normal. No stridor. No respiratory distress. He has no wheezes. He has no rales. He exhibits no tenderness.  Abdominal: Soft. Bowel sounds are normal. He exhibits no distension and no mass. There is no tenderness. There is no rebound and no guarding.  Musculoskeletal: Normal range of motion. He exhibits edema (trace pitting edema in BLE). He exhibits no tenderness.  Lymphadenopathy:    He has no cervical adenopathy.  Neurological: He is oriented to person, place, and time.  Skin: Skin is warm and dry. No rash noted. He is not diaphoretic. No erythema. No pallor.  Psychiatric: He has a normal mood and affect. His behavior is normal. Judgment and thought content normal.      Lab  Results  Component Value Date   WBC 8.6 02/15/2011   HGB 13.5 02/15/2011   HCT 40.0 02/15/2011   PLT 94.0* 02/15/2011   GLUCOSE 111* 02/15/2011   CHOL 141 02/15/2011   TRIG 146.0 02/15/2011   HDL 38.10* 02/15/2011   LDLDIRECT 80.6 06/26/2010   LDLCALC 74 02/15/2011   ALT 17 02/15/2011   AST 16 02/15/2011   NA 144 02/15/2011   K 4.3 02/15/2011   CL 112 02/15/2011   CREATININE 1.8*  02/15/2011   BUN 28* 02/15/2011   CO2 25 02/15/2011   TSH 1.68 02/15/2011   PSA 0.03* 06/29/2009   INR 1.1 04/08/2008   HGBA1C 9.4* 09/05/2011   MICROALBUR 1.7 03/23/2008      Assessment & Plan:

## 2011-10-30 NOTE — Assessment & Plan Note (Signed)
His BP is well controlled, I will check his lytes and renal function today 

## 2011-10-30 NOTE — Assessment & Plan Note (Signed)
Dr. Loanne Drilling is managing this

## 2011-10-30 NOTE — Patient Instructions (Signed)

## 2011-10-30 NOTE — Assessment & Plan Note (Signed)
I have ordered repeat arterial flow studies on him

## 2011-10-30 NOTE — Assessment & Plan Note (Signed)
CBC today.  

## 2011-10-30 NOTE — Assessment & Plan Note (Signed)
I will recheck his renal function today 

## 2011-11-06 ENCOUNTER — Encounter (INDEPENDENT_AMBULATORY_CARE_PROVIDER_SITE_OTHER): Payer: Medicare Other

## 2011-11-06 DIAGNOSIS — I70219 Atherosclerosis of native arteries of extremities with intermittent claudication, unspecified extremity: Secondary | ICD-10-CM

## 2011-11-06 DIAGNOSIS — E1151 Type 2 diabetes mellitus with diabetic peripheral angiopathy without gangrene: Secondary | ICD-10-CM

## 2011-11-11 ENCOUNTER — Other Ambulatory Visit: Payer: Self-pay | Admitting: Internal Medicine

## 2011-11-11 DIAGNOSIS — I70219 Atherosclerosis of native arteries of extremities with intermittent claudication, unspecified extremity: Secondary | ICD-10-CM

## 2011-11-28 ENCOUNTER — Encounter: Payer: Medicare Other | Admitting: Vascular Surgery

## 2011-12-10 ENCOUNTER — Ambulatory Visit (INDEPENDENT_AMBULATORY_CARE_PROVIDER_SITE_OTHER): Payer: Medicare Other | Admitting: Endocrinology

## 2011-12-10 ENCOUNTER — Encounter: Payer: Self-pay | Admitting: Endocrinology

## 2011-12-10 VITALS — BP 126/72 | HR 63 | Temp 98.1°F | Wt 218.0 lb

## 2011-12-10 DIAGNOSIS — E119 Type 2 diabetes mellitus without complications: Secondary | ICD-10-CM | POA: Insufficient documentation

## 2011-12-10 DIAGNOSIS — D518 Other vitamin B12 deficiency anemias: Secondary | ICD-10-CM

## 2011-12-10 LAB — HEMOGLOBIN A1C
Hgb A1c MFr Bld: 9.9 % — ABNORMAL HIGH (ref <5.7)
Mean Plasma Glucose: 237 mg/dL — ABNORMAL HIGH (ref <117)

## 2011-12-10 MED ORDER — CYANOCOBALAMIN 1000 MCG/ML IJ SOLN
1000.0000 ug | Freq: Once | INTRAMUSCULAR | Status: AC
  Administered 2011-12-10: 1000 ug via INTRAMUSCULAR

## 2011-12-10 NOTE — Patient Instructions (Addendum)
check your blood sugar 2 times a day.  vary the time of day when you check, between before the 3 meals, and at bedtime.  also check if you have symptoms of your blood sugar being too high or too low.  please keep a record of the readings and bring it to your next appointment here.  please call us sooner if your blood sugar goes below 70, or if it stays over 200.  reduce lantus to 25 units at bedtime only.  increase humalog to 3x a day (just before each meal), 30-50-45 units Please come back for a follow-up appointment in 3 months.
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
## 2011-12-17 ENCOUNTER — Other Ambulatory Visit: Payer: Self-pay

## 2011-12-17 MED ORDER — METOPROLOL TARTRATE 50 MG PO TABS: ORAL_TABLET | ORAL | Status: DC

## 2011-12-31 ENCOUNTER — Other Ambulatory Visit: Payer: Self-pay

## 2011-12-31 MED ORDER — ALLOPURINOL 300 MG PO TABS: ORAL_TABLET | ORAL | Status: DC

## 2012-01-09 ENCOUNTER — Ambulatory Visit (INDEPENDENT_AMBULATORY_CARE_PROVIDER_SITE_OTHER): Payer: Medicare Other | Admitting: *Deleted

## 2012-01-09 DIAGNOSIS — D518 Other vitamin B12 deficiency anemias: Secondary | ICD-10-CM | POA: Diagnosis not present

## 2012-01-09 MED ORDER — CYANOCOBALAMIN 1000 MCG/ML IJ SOLN
1000.0000 ug | Freq: Once | INTRAMUSCULAR | Status: AC
  Administered 2012-01-09: 1000 ug via INTRAMUSCULAR

## 2012-01-24 ENCOUNTER — Telehealth: Payer: Self-pay | Admitting: Internal Medicine

## 2012-01-24 NOTE — Telephone Encounter (Signed)
Patient notified to pick up 

## 2012-01-24 NOTE — Telephone Encounter (Signed)
Pt needs samples of Benicar and Humalog.  He is out of the Humalog.  He also needs another sample that he has been out of for a week.  He does not know what it is or what it is for.  I asked him to look over his med list from a visit to see if he can find it.

## 2012-02-10 ENCOUNTER — Other Ambulatory Visit: Payer: Self-pay

## 2012-02-10 ENCOUNTER — Ambulatory Visit (INDEPENDENT_AMBULATORY_CARE_PROVIDER_SITE_OTHER): Payer: Medicare Other

## 2012-02-10 DIAGNOSIS — D518 Other vitamin B12 deficiency anemias: Secondary | ICD-10-CM

## 2012-02-10 MED ORDER — CYANOCOBALAMIN 1000 MCG/ML IJ SOLN
1000.0000 ug | Freq: Once | INTRAMUSCULAR | Status: AC
  Administered 2012-02-10: 1000 ug via INTRAMUSCULAR

## 2012-02-10 MED ORDER — SIMVASTATIN 20 MG PO TABS: ORAL_TABLET | ORAL | Status: DC

## 2012-02-24 ENCOUNTER — Telehealth: Payer: Self-pay | Admitting: Internal Medicine

## 2012-02-24 DIAGNOSIS — E785 Hyperlipidemia, unspecified: Secondary | ICD-10-CM

## 2012-02-24 DIAGNOSIS — I10 Essential (primary) hypertension: Secondary | ICD-10-CM

## 2012-02-24 MED ORDER — OLMESARTAN MEDOXOMIL 20 MG PO TABS: 20.0000 mg | ORAL_TABLET | Freq: Every day | ORAL | Status: DC

## 2012-02-24 MED ORDER — OMEGA-3-ACID ETHYL ESTERS 1 G PO CAPS: 2.0000 g | ORAL_CAPSULE | Freq: Two times a day (BID) | ORAL | Status: DC

## 2012-02-24 NOTE — Telephone Encounter (Signed)
Robert Clayton is requesting a refill of Benicar.  Also, states MD gave him samples of Lovaza. Is out of samples and requesting script for Lovaza to be called in as well.  Please f/u with Robert Clayton regarding script requests.

## 2012-02-24 NOTE — Telephone Encounter (Signed)
Rx for Benicar and Lovaza sent to [facility], pt informed.

## 2012-03-02 ENCOUNTER — Ambulatory Visit: Payer: Medicare Other | Admitting: Internal Medicine

## 2012-03-03 ENCOUNTER — Ambulatory Visit (INDEPENDENT_AMBULATORY_CARE_PROVIDER_SITE_OTHER): Payer: Medicare Other | Admitting: Internal Medicine

## 2012-03-03 ENCOUNTER — Encounter: Payer: Self-pay | Admitting: Internal Medicine

## 2012-03-03 VITALS — BP 124/76 | HR 81 | Temp 98.0°F | Resp 16 | Wt 219.0 lb

## 2012-03-03 DIAGNOSIS — I798 Other disorders of arteries, arterioles and capillaries in diseases classified elsewhere: Secondary | ICD-10-CM

## 2012-03-03 DIAGNOSIS — N189 Chronic kidney disease, unspecified: Secondary | ICD-10-CM

## 2012-03-03 DIAGNOSIS — E1165 Type 2 diabetes mellitus with hyperglycemia: Secondary | ICD-10-CM

## 2012-03-03 DIAGNOSIS — E1159 Type 2 diabetes mellitus with other circulatory complications: Secondary | ICD-10-CM

## 2012-03-03 DIAGNOSIS — I129 Hypertensive chronic kidney disease with stage 1 through stage 4 chronic kidney disease, or unspecified chronic kidney disease: Secondary | ICD-10-CM | POA: Diagnosis not present

## 2012-03-03 DIAGNOSIS — D518 Other vitamin B12 deficiency anemias: Secondary | ICD-10-CM

## 2012-03-03 MED ORDER — LOSARTAN POTASSIUM 100 MG PO TABS: 100.0000 mg | ORAL_TABLET | Freq: Every day | ORAL | Status: DC

## 2012-03-03 MED ORDER — CYANOCOBALAMIN 1000 MCG/ML IJ SOLN
1000.0000 ug | INTRAMUSCULAR | Status: DC
  Administered 2012-03-03 – 2012-06-04 (×4): 1000 ug via INTRAMUSCULAR

## 2012-03-03 NOTE — Patient Instructions (Signed)

## 2012-03-07 ENCOUNTER — Encounter: Payer: Self-pay | Admitting: Internal Medicine

## 2012-03-07 NOTE — Progress Notes (Signed)
  Subjective:    Patient ID: Robert Clayton, male    DOB: 01/11/1930, 79 y.o.   MRN: [id number]  Hypertension This is a chronic problem. The current episode started more than 1 year ago. The problem is controlled. Pertinent negatives include no anxiety, chest pain, malaise/fatigue, neck pain, orthopnea, palpitations, peripheral edema, PND or shortness of breath. There are no associated agents to hypertension. The current treatment provides significant improvement. Compliance problems include medication cost.  Hypertensive end-organ damage includes kidney disease.      Review of Systems  Constitutional: Negative for fever, chills, malaise/fatigue, diaphoresis, activity change, appetite change, fatigue and unexpected weight change.  HENT: Negative.  Negative for neck pain.   Eyes: Negative.   Respiratory: Negative for cough, chest tightness, shortness of breath, wheezing and stridor.   Cardiovascular: Negative for chest pain, palpitations, orthopnea, leg swelling and PND.  Gastrointestinal: Negative for nausea, vomiting, abdominal pain, diarrhea and constipation.  Endocrine: Negative.   Genitourinary: Negative.   Musculoskeletal: Negative for myalgias, back pain, joint swelling, arthralgias and gait problem.  Skin: Negative.   Allergic/Immunologic: Negative.   Neurological: Negative for dizziness, weakness and light-headedness.  Hematological: Negative for adenopathy. Does not bruise/bleed easily.  Psychiatric/Behavioral: Negative.        Objective:   Physical Exam  Constitutional: He is oriented to person, place, and time. He appears well-developed and well-nourished. No distress.  HENT:  Head: Normocephalic and atraumatic.  Mouth/Throat: Oropharynx is clear and moist. No oropharyngeal exudate.  Eyes: Conjunctivae are normal. Right eye exhibits no discharge. Left eye exhibits no discharge. No scleral icterus.  Neck: Normal range of motion. Neck supple. No JVD present. No tracheal  deviation present. No thyromegaly present.  Cardiovascular: Normal rate, regular rhythm, normal heart sounds and intact distal pulses.  Exam reveals no gallop and no friction rub.   No murmur heard. Pulmonary/Chest: Effort normal and breath sounds normal. No stridor. No respiratory distress. He has no wheezes. He has no rales. He exhibits no tenderness.  Abdominal: Soft. Bowel sounds are normal. He exhibits no distension and no mass. There is no tenderness. There is no rebound and no guarding.  Musculoskeletal: Normal range of motion. He exhibits no edema and no tenderness.  Lymphadenopathy:    He has no cervical adenopathy.  Neurological: He is oriented to person, place, and time.  Skin: Skin is warm and dry. No rash noted. He is not diaphoretic. No erythema. No pallor.  Psychiatric: He has a normal mood and affect. His behavior is normal. Judgment and thought content normal.     Lab Results  Component Value Date   WBC 9.3 10/30/2011   HGB 12.6* 10/30/2011   HCT 38.9* 10/30/2011   PLT 98.0* 10/30/2011   GLUCOSE 234* 10/30/2011   CHOL 141 02/15/2011   TRIG 146.0 02/15/2011   HDL 38.10* 02/15/2011   LDLDIRECT 80.6 06/26/2010   LDLCALC 74 02/15/2011   ALT 15 10/30/2011   AST 16 10/30/2011   NA 143 10/30/2011   K 4.2 10/30/2011   CL 113* 10/30/2011   CREATININE 2.0* 10/30/2011   BUN 30* 10/30/2011   CO2 25 10/30/2011   TSH 1.68 02/15/2011   PSA 0.03* 06/29/2009   INR 1.1 04/08/2008   HGBA1C 9.9* 12/10/2011   MICROALBUR 1.7 03/23/2008       Assessment & Plan:

## 2012-03-07 NOTE — Assessment & Plan Note (Signed)
Change benicar to generic losartan

## 2012-03-07 NOTE — Assessment & Plan Note (Signed)
No changes today

## 2012-03-10 ENCOUNTER — Ambulatory Visit: Payer: Medicare Other | Admitting: Endocrinology

## 2012-03-10 ENCOUNTER — Ambulatory Visit: Payer: Medicare Other

## 2012-03-25 ENCOUNTER — Other Ambulatory Visit: Payer: Self-pay | Admitting: Internal Medicine

## 2012-04-01 ENCOUNTER — Ambulatory Visit: Payer: Medicare Other

## 2012-04-03 ENCOUNTER — Ambulatory Visit (INDEPENDENT_AMBULATORY_CARE_PROVIDER_SITE_OTHER): Payer: Medicare Other

## 2012-04-03 DIAGNOSIS — D518 Other vitamin B12 deficiency anemias: Secondary | ICD-10-CM

## 2012-04-13 ENCOUNTER — Other Ambulatory Visit: Payer: Self-pay | Admitting: *Deleted

## 2012-04-13 MED ORDER — INSULIN LISPRO 100 UNIT/ML ~~LOC~~ SOLN: SUBCUTANEOUS | Status: DC

## 2012-05-04 ENCOUNTER — Ambulatory Visit (INDEPENDENT_AMBULATORY_CARE_PROVIDER_SITE_OTHER): Payer: Medicare Other

## 2012-05-04 DIAGNOSIS — D518 Other vitamin B12 deficiency anemias: Secondary | ICD-10-CM

## 2012-06-03 ENCOUNTER — Ambulatory Visit: Payer: Medicare Other

## 2012-06-04 ENCOUNTER — Ambulatory Visit (INDEPENDENT_AMBULATORY_CARE_PROVIDER_SITE_OTHER): Payer: Medicare Other

## 2012-06-04 ENCOUNTER — Ambulatory Visit: Payer: Medicare Other | Admitting: Internal Medicine

## 2012-06-04 DIAGNOSIS — D518 Other vitamin B12 deficiency anemias: Secondary | ICD-10-CM | POA: Diagnosis not present

## 2012-06-07 ENCOUNTER — Other Ambulatory Visit: Payer: Self-pay | Admitting: Internal Medicine

## 2012-06-08 ENCOUNTER — Encounter: Payer: Self-pay | Admitting: Internal Medicine

## 2012-06-08 ENCOUNTER — Other Ambulatory Visit (INDEPENDENT_AMBULATORY_CARE_PROVIDER_SITE_OTHER): Payer: Medicare Other

## 2012-06-08 ENCOUNTER — Ambulatory Visit (INDEPENDENT_AMBULATORY_CARE_PROVIDER_SITE_OTHER): Payer: Medicare Other | Admitting: Internal Medicine

## 2012-06-08 VITALS — BP 120/72 | HR 76 | Temp 98.5°F | Resp 16 | Wt 217.5 lb

## 2012-06-08 DIAGNOSIS — N181 Chronic kidney disease, stage 1: Secondary | ICD-10-CM

## 2012-06-08 DIAGNOSIS — E785 Hyperlipidemia, unspecified: Secondary | ICD-10-CM

## 2012-06-08 DIAGNOSIS — I120 Hypertensive chronic kidney disease with stage 5 chronic kidney disease or end stage renal disease: Secondary | ICD-10-CM

## 2012-06-08 DIAGNOSIS — E1159 Type 2 diabetes mellitus with other circulatory complications: Secondary | ICD-10-CM | POA: Diagnosis not present

## 2012-06-08 DIAGNOSIS — I129 Hypertensive chronic kidney disease with stage 1 through stage 4 chronic kidney disease, or unspecified chronic kidney disease: Secondary | ICD-10-CM

## 2012-06-08 DIAGNOSIS — I70219 Atherosclerosis of native arteries of extremities with intermittent claudication, unspecified extremity: Secondary | ICD-10-CM | POA: Diagnosis not present

## 2012-06-08 DIAGNOSIS — E1151 Type 2 diabetes mellitus with diabetic peripheral angiopathy without gangrene: Secondary | ICD-10-CM

## 2012-06-08 DIAGNOSIS — IMO0002 Reserved for concepts with insufficient information to code with codable children: Secondary | ICD-10-CM

## 2012-06-08 DIAGNOSIS — I798 Other disorders of arteries, arterioles and capillaries in diseases classified elsewhere: Secondary | ICD-10-CM

## 2012-06-08 LAB — COMPREHENSIVE METABOLIC PANEL
ALT: 22 U/L (ref 0–53)
AST: 21 U/L (ref 0–37)
Alkaline Phosphatase: 84 U/L (ref 39–117)
Sodium: 142 mEq/L (ref 135–145)
Total Bilirubin: 0.3 mg/dL (ref 0.3–1.2)
Total Protein: 6.8 g/dL (ref 6.0–8.3)

## 2012-06-08 LAB — LIPID PANEL
LDL Cholesterol: 75 mg/dL (ref 0–99)
VLDL: 29.4 mg/dL (ref 0.0–40.0)

## 2012-06-08 LAB — CK: Total CK: 131 U/L (ref 7–232)

## 2012-06-08 MED ORDER — LINAGLIPTIN 5 MG PO TABS: 5.0000 mg | ORAL_TABLET | Freq: Every day | ORAL | Status: DC

## 2012-06-08 NOTE — Assessment & Plan Note (Signed)
FLP CMP CPK today

## 2012-06-08 NOTE — Progress Notes (Signed)
Subjective:    Patient ID: Robert Clayton, male    DOB: 1930/08/03, 79 y.o.   MRN: [id number]  Diabetes He presents for his follow-up diabetic visit. He has type 2 diabetes mellitus. His disease course has been stable. There are no hypoglycemic associated symptoms. Pertinent negatives for hypoglycemia include no dizziness or pallor. Pertinent negatives for diabetes include no blurred vision, no chest pain, no fatigue, no foot paresthesias, no foot ulcerations, no polydipsia, no polyphagia, no polyuria, no visual change, no weakness and no weight loss. There are no hypoglycemic complications. Symptoms are stable. Diabetic complications include nephropathy and PVD. Current diabetic treatment includes intensive insulin program and insulin injections. He is compliant with treatment all of the time. His weight is stable. He is following a generally healthy diet. Meal planning includes avoidance of concentrated sweets. He has not had a previous visit with a dietician. He participates in exercise intermittently. His breakfast blood glucose range is generally 180-200 mg/dl. His lunch blood glucose range is generally 180-200 mg/dl. His dinner blood glucose range is generally >200 mg/dl. His highest blood glucose is >200 mg/dl. His overall blood glucose range is 180-200 mg/dl. An ACE inhibitor/angiotensin II receptor blocker is being taken. He does not see a podiatrist.Eye exam is current.      Review of Systems  Constitutional: Negative.  Negative for weight loss, diaphoresis, activity change, appetite change, fatigue and unexpected weight change.  HENT: Negative.   Eyes: Negative.  Negative for blurred vision.  Respiratory: Negative.  Negative for cough, chest tightness, shortness of breath, wheezing and stridor.   Cardiovascular: Negative.  Negative for chest pain, palpitations and leg swelling.  Gastrointestinal: Negative.  Negative for nausea, vomiting, abdominal pain, diarrhea and constipation.   Endocrine: Negative.  Negative for polydipsia, polyphagia and polyuria.  Genitourinary: Negative.   Musculoskeletal: Positive for myalgias (calves are cramping). Negative for back pain, joint swelling, arthralgias and gait problem.  Skin: Negative for color change, pallor, rash and wound.  Allergic/Immunologic: Negative.   Neurological: Negative.  Negative for dizziness, weakness and light-headedness.  Hematological: Negative.  Negative for adenopathy. Does not bruise/bleed easily.  Psychiatric/Behavioral: Negative.        Objective:   Physical Exam  Vitals reviewed. Constitutional: He is oriented to person, place, and time. He appears well-developed and well-nourished. No distress.  HENT:  Head: Normocephalic and atraumatic.  Mouth/Throat: Oropharynx is clear and moist. No oropharyngeal exudate.  Eyes: Conjunctivae are normal. Right eye exhibits no discharge. Left eye exhibits no discharge. No scleral icterus.  Neck: Normal range of motion. Neck supple. No JVD present. No tracheal deviation present. No thyromegaly present.  Cardiovascular: Normal rate, regular rhythm, normal heart sounds and intact distal pulses.  Exam reveals no gallop and no friction rub.   No murmur heard. Pulses:      Carotid pulses are 1+ on the right side, and 1+ on the left side.      Radial pulses are 1+ on the right side, and 1+ on the left side.       Femoral pulses are 1+ on the right side, and 1+ on the left side.      Popliteal pulses are 0 on the right side, and 0 on the left side.       Dorsalis pedis pulses are 0 on the right side, and 0 on the left side.       Posterior tibial pulses are 0 on the right side, and 0 on the left side.  Pulmonary/Chest: Effort  normal and breath sounds normal. No stridor. No respiratory distress. He has no wheezes. He has no rales. He exhibits no tenderness.  Abdominal: Soft. Bowel sounds are normal. He exhibits no distension and no mass. There is no tenderness. There is  no rebound and no guarding.  Musculoskeletal: Normal range of motion. He exhibits no edema and no tenderness.  Lymphadenopathy:    He has no cervical adenopathy.  Neurological: He is oriented to person, place, and time.  Skin: Skin is warm and dry. No rash noted. He is not diaphoretic. No erythema. No pallor.  Psychiatric: He has a normal mood and affect. His behavior is normal. Judgment and thought content normal.          Assessment & Plan:

## 2012-06-08 NOTE — Assessment & Plan Note (Signed)
I will recheck her A1C today I have asked him to add tradjenta to his insulin regimen

## 2012-06-08 NOTE — Patient Instructions (Addendum)
Type 2 Diabetes Mellitus, Adult Type 2 diabetes mellitus, often simply referred to as type 2 diabetes, is a long-lasting (chronic) disease. In type 2 diabetes, the pancreas does not make enough insulin (a hormone), the cells are less responsive to the insulin that is made (insulin resistance), or both. Normally, insulin moves sugars from food into the tissue cells. The tissue cells use the sugars for energy. The lack of insulin or the lack of normal response to insulin causes excess sugars to build up in the blood instead of going into the tissue cells. As a result, high blood sugar (hyperglycemia) develops. The effect of high sugar (glucose) levels can cause many complications. Type 2 diabetes was also previously called adult-onset diabetes but it can occur at any age.  RISK FACTORS  A person is predisposed to developing type 2 diabetes if someone in the family has the disease and also has one or more of the following primary risk factors:  Overweight.  An inactive lifestyle.  A history of consistently eating high-calorie foods. Maintaining a normal weight and regular physical activity can reduce the chance of developing type 2 diabetes. SYMPTOMS  A person with type 2 diabetes may not show symptoms initially. The symptoms of type 2 diabetes appear slowly. The symptoms include:  Increased thirst (polydipsia).  Increased urination (polyuria).  Increased urination during the night (nocturia).  Weight loss. This weight loss may be rapid.  Frequent, recurring infections.  Tiredness (fatigue).  Weakness.  Vision changes, such as blurred vision.  Fruity smell to your breath.  Abdominal pain.  Nausea or vomiting.  Cuts or bruises which are slow to heal.  Tingling or numbness in the hands or feet. DIAGNOSIS Type 2 diabetes is frequently not diagnosed until complications of diabetes are present. Type 2 diabetes is diagnosed when symptoms or complications are present and when blood  glucose levels are increased. Your blood glucose level may be checked by one or more of the following blood tests:  A fasting blood glucose test. You will not be allowed to eat for at least 8 hours before a blood sample is taken.  A random blood glucose test. Your blood glucose is checked at any time of the day regardless of when you ate.  A hemoglobin A1c blood glucose test. A hemoglobin A1c test provides information about blood glucose control over the previous 3 months.  An oral glucose tolerance test (OGTT). Your blood glucose is measured after you have not eaten (fasted) for 2 hours and then after you drink a glucose-containing beverage. TREATMENT   You may need to take insulin or diabetes medicine daily to keep blood glucose levels in the desired range.  You will need to match insulin dosing with exercise and healthy food choices. The treatment goal is to maintain the before meal blood sugar (preprandial glucose) level at 70 130 mg/dL. HOME CARE INSTRUCTIONS   Have your hemoglobin A1c level checked twice a year.  Perform daily blood glucose monitoring as directed by your caregiver.  Monitor urine ketones when you are ill and as directed by your caregiver.  Take your diabetes medicine or insulin as directed by your caregiver to maintain your blood glucose levels in the desired range.  Never run out of diabetes medicine or insulin. It is needed every day.  Adjust insulin based on your intake of carbohydrates. Carbohydrates can raise blood glucose levels but need to be included in your diet. Carbohydrates provide vitamins, minerals, and fiber which are an essential part of   a healthy diet. Carbohydrates are found in fruits, vegetables, whole grains, dairy products, legumes, and foods containing added sugars.    Eat healthy foods. Alternate 3 meals with 3 snacks.  Lose weight if overweight.  Carry a medical alert card or wear your medical alert jewelry.  Carry a 15 gram  carbohydrate snack with you at all times to treat low blood glucose (hypoglycemia). Some examples of 15 gram carbohydrate snacks include:  Glucose tablets, 3 or 4   Glucose gel, 15 gram tube  Raisins, 2 tablespoons (24 grams)  Jelly beans, 6  Animal crackers, 8  Regular pop, 4 ounces (120 mL)  Gummy treats, 9  Recognize hypoglycemia. Hypoglycemia occurs with blood glucose levels of 70 mg/dL and below. The risk for hypoglycemia increases when fasting or skipping meals, during or after intense exercise, and during sleep. Hypoglycemia symptoms can include:  Tremors or shakes.  Decreased ability to concentrate.  Sweating.  Increased heart rate.  Headache.  Dry mouth.  Hunger.  Irritability.  Anxiety.  Restless sleep.  Altered speech or coordination.  Confusion.  Treat hypoglycemia promptly. If you are alert and able to safely swallow, follow the 15:15 rule:  Take 15 20 grams of rapid-acting glucose or carbohydrate. Rapid-acting options include glucose gel, glucose tablets, or 4 ounces (120 mL) of fruit juice, regular soda, or low fat milk.  Check your blood glucose level 15 minutes after taking the glucose.  Take 15 20 grams more of glucose if the repeat blood glucose level is still 70 mg/dL or below.  Eat a meal or snack within 1 hour once blood glucose levels return to normal.    Be alert to polyuria and polydipsia which are early signs of hyperglycemia. An early awareness of hyperglycemia allows for prompt treatment. Treat hyperglycemia as directed by your caregiver.  Engage in at least 150 minutes of moderate-intensity physical activity a week, spread over at least 3 days of the week or as directed by your caregiver. In addition, you should engage in resistance exercise at least 2 times a week or as directed by your caregiver.  Adjust your medicine and food intake as needed if you start a new exercise or sport.  Follow your sick day plan at any time you  are unable to eat or drink as usual.  Avoid tobacco use.  Limit alcohol intake to no more than 1 drink per day for nonpregnant women and 2 drinks per day for men. You should drink alcohol only when you are also eating food. Talk with your caregiver whether alcohol is safe for you. Tell your caregiver if you drink alcohol several times a week.  Follow up with your caregiver regularly.  Schedule an eye exam soon after the diagnosis of type 2 diabetes and then annually.  Perform daily skin and foot care. Examine your skin and feet daily for cuts, bruises, redness, nail problems, bleeding, blisters, or sores. A foot exam by a caregiver should be done annually.  Brush your teeth and gums at least twice a day and floss at least once a day. Follow up with your dentist regularly.  Share your diabetes management plan with your workplace or school.  Stay up-to-date with immunizations.  Learn to manage stress.  Obtain ongoing diabetes education and support as needed.  Participate in, or seek rehabilitation as needed to maintain or improve independence and quality of life. Request a physical or occupational therapy referral if you are having foot or hand numbness or difficulties with grooming,   dressing, eating, or physical activity. SEEK MEDICAL CARE IF:   You are unable to eat food or drink fluids for more than 6 hours.  You have nausea and vomiting for more than 6 hours.  Your blood glucose level is over 240 mg/dL.  There is a change in mental status.  You develop an additional serious illness.  You have diarrhea for more than 6 hours.  You have been sick or have had a fever for a couple of days and are not getting better.  You have pain during any physical activity.  SEEK IMMEDIATE MEDICAL CARE IF:  You have difficulty breathing.  You have moderate to large ketone levels. MAKE SURE YOU:  Understand these instructions.  Will watch your condition.  Will get help right away if  you are not doing well or get worse. Document Released: 12/24/2004 Document Revised: 09/18/2011 Document Reviewed: 07/23/2011 [employer] Patient Information 2014 [employer].  

## 2012-06-08 NOTE — Assessment & Plan Note (Signed)
I have asked him to see vascular surgery about this

## 2012-06-08 NOTE — Assessment & Plan Note (Signed)
His BP is well controlled Today I will check his lytes and renal function 

## 2012-07-02 ENCOUNTER — Ambulatory Visit: Payer: Medicare Other

## 2012-07-15 ENCOUNTER — Other Ambulatory Visit: Payer: Self-pay | Admitting: *Deleted

## 2012-07-15 DIAGNOSIS — I70219 Atherosclerosis of native arteries of extremities with intermittent claudication, unspecified extremity: Secondary | ICD-10-CM

## 2012-07-20 ENCOUNTER — Encounter (HOSPITAL_COMMUNITY): Payer: Self-pay | Admitting: Emergency Medicine

## 2012-07-20 ENCOUNTER — Emergency Department (HOSPITAL_COMMUNITY)
Admission: EM | Admit: 2012-07-20 | Discharge: 2012-07-20 | Disposition: A | Discharge status: Home / Self Care | Payer: Medicare Other | Attending: Emergency Medicine | Admitting: Emergency Medicine

## 2012-07-20 DIAGNOSIS — Z8546 Personal history of malignant neoplasm of prostate: Secondary | ICD-10-CM | POA: Insufficient documentation

## 2012-07-20 DIAGNOSIS — E785 Hyperlipidemia, unspecified: Secondary | ICD-10-CM | POA: Diagnosis not present

## 2012-07-20 DIAGNOSIS — E8881 Metabolic syndrome: Secondary | ICD-10-CM | POA: Insufficient documentation

## 2012-07-20 DIAGNOSIS — I2510 Atherosclerotic heart disease of native coronary artery without angina pectoris: Secondary | ICD-10-CM | POA: Insufficient documentation

## 2012-07-20 DIAGNOSIS — T675XXA Heat exhaustion, unspecified, initial encounter: Secondary | ICD-10-CM | POA: Insufficient documentation

## 2012-07-20 DIAGNOSIS — N181 Chronic kidney disease, stage 1: Secondary | ICD-10-CM | POA: Diagnosis not present

## 2012-07-20 DIAGNOSIS — E119 Type 2 diabetes mellitus without complications: Secondary | ICD-10-CM | POA: Insufficient documentation

## 2012-07-20 DIAGNOSIS — M109 Gout, unspecified: Secondary | ICD-10-CM | POA: Insufficient documentation

## 2012-07-20 DIAGNOSIS — X30XXXA Exposure to excessive natural heat, initial encounter: Secondary | ICD-10-CM | POA: Insufficient documentation

## 2012-07-20 DIAGNOSIS — I129 Hypertensive chronic kidney disease with stage 1 through stage 4 chronic kidney disease, or unspecified chronic kidney disease: Secondary | ICD-10-CM | POA: Diagnosis not present

## 2012-07-20 DIAGNOSIS — I959 Hypotension, unspecified: Secondary | ICD-10-CM | POA: Diagnosis not present

## 2012-07-20 DIAGNOSIS — Z794 Long term (current) use of insulin: Secondary | ICD-10-CM | POA: Insufficient documentation

## 2012-07-20 DIAGNOSIS — E860 Dehydration: Secondary | ICD-10-CM | POA: Insufficient documentation

## 2012-07-20 DIAGNOSIS — Y939 Activity, unspecified: Secondary | ICD-10-CM | POA: Insufficient documentation

## 2012-07-20 DIAGNOSIS — Z79899 Other long term (current) drug therapy: Secondary | ICD-10-CM | POA: Insufficient documentation

## 2012-07-20 DIAGNOSIS — R5381 Other malaise: Secondary | ICD-10-CM | POA: Diagnosis not present

## 2012-07-20 DIAGNOSIS — Z7982 Long term (current) use of aspirin: Secondary | ICD-10-CM | POA: Diagnosis not present

## 2012-07-20 DIAGNOSIS — Z87820 Personal history of traumatic brain injury: Secondary | ICD-10-CM | POA: Diagnosis not present

## 2012-07-20 DIAGNOSIS — R404 Transient alteration of awareness: Secondary | ICD-10-CM | POA: Diagnosis not present

## 2012-07-20 DIAGNOSIS — Y929 Unspecified place or not applicable: Secondary | ICD-10-CM | POA: Insufficient documentation

## 2012-07-20 LAB — COMPREHENSIVE METABOLIC PANEL WITH GFR
ALT: 14 U/L (ref 0–53)
AST: 19 U/L (ref 0–37)
Albumin: 3.2 g/dL — ABNORMAL LOW (ref 3.5–5.2)
Alkaline Phosphatase: 83 U/L (ref 39–117)
BUN: 45 mg/dL — ABNORMAL HIGH (ref 6–23)
CO2: 20 meq/L (ref 19–32)
Calcium: 9.6 mg/dL (ref 8.4–10.5)
Chloride: 113 meq/L — ABNORMAL HIGH (ref 96–112)
Creatinine, Ser: 2.91 mg/dL — ABNORMAL HIGH (ref 0.50–1.35)
GFR calc Af Amer: 22 mL/min — ABNORMAL LOW
GFR calc non Af Amer: 19 mL/min — ABNORMAL LOW
Glucose, Bld: 201 mg/dL — ABNORMAL HIGH (ref 70–99)
Potassium: 5 meq/L (ref 3.5–5.1)
Sodium: 143 meq/L (ref 135–145)
Total Bilirubin: 0.4 mg/dL (ref 0.3–1.2)
Total Protein: 6.4 g/dL (ref 6.0–8.3)

## 2012-07-20 LAB — CBC WITH DIFFERENTIAL/PLATELET
Basophils Absolute: 0 10*3/uL (ref 0.0–0.1)
Eosinophils Relative: 1 % (ref 0–5)
HCT: 37 % — ABNORMAL LOW (ref 39.0–52.0)
Lymphocytes Relative: 25 % (ref 12–46)
Lymphs Abs: 2.3 10*3/uL (ref 0.7–4.0)
MCH: 30.1 pg (ref 26.0–34.0)
MCV: 88.5 fL (ref 78.0–100.0)
Monocytes Absolute: 0.7 10*3/uL (ref 0.1–1.0)
RDW: 14.3 % (ref 11.5–15.5)
WBC: 9.3 10*3/uL (ref 4.0–10.5)

## 2012-07-20 LAB — URINE MICROSCOPIC-ADD ON

## 2012-07-20 LAB — TROPONIN I: Troponin I: <0.3 ng/mL

## 2012-07-20 LAB — URINALYSIS, ROUTINE W REFLEX MICROSCOPIC
Bilirubin Urine: NEGATIVE
Glucose, UA: >1000 mg/dL — AB
Hgb urine dipstick: NEGATIVE
Ketones, ur: NEGATIVE mg/dL
Nitrite: NEGATIVE
Protein, ur: NEGATIVE mg/dL
Specific Gravity, Urine: 1.022 (ref 1.005–1.030)
Urobilinogen, UA: 0.2 mg/dL (ref 0.0–1.0)
pH: 5.5 (ref 5.0–8.0)

## 2012-07-20 LAB — CG4 I-STAT (LACTIC ACID): Lactic Acid, Venous: 2.21 mmol/L — ABNORMAL HIGH (ref 0.5–2.2)

## 2012-07-20 MED ORDER — SODIUM CHLORIDE 0.9 % IV BOLUS (SEPSIS)
500.0000 mL | Freq: Once | INTRAVENOUS | Status: AC
  Administered 2012-07-20: 500 mL via INTRAVENOUS
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
## 2012-07-20 NOTE — ED Notes (Addendum)
Pt c/o weakness x 3 days. When EMS arrived pt was outside, diaphoretic NSR 78HR, original BP 90palp. Now 100systolic. No c/o pain. CBG 166.

## 2012-07-20 NOTE — ED Notes (Addendum)
Pt ambulated very well and stated that he felt better.

## 2012-08-04 ENCOUNTER — Ambulatory Visit: Payer: Medicare Other

## 2012-08-05 ENCOUNTER — Encounter: Payer: Medicare Other | Admitting: Vascular Surgery

## 2012-08-07 DIAGNOSIS — M65979 Unspecified synovitis and tenosynovitis, unspecified ankle and foot: Secondary | ICD-10-CM | POA: Diagnosis not present

## 2012-08-07 DIAGNOSIS — M25579 Pain in unspecified ankle and joints of unspecified foot: Secondary | ICD-10-CM | POA: Diagnosis not present

## 2012-08-07 DIAGNOSIS — M722 Plantar fascial fibromatosis: Secondary | ICD-10-CM | POA: Diagnosis not present

## 2012-08-07 DIAGNOSIS — M79609 Pain in unspecified limb: Secondary | ICD-10-CM | POA: Diagnosis not present

## 2012-08-07 DIAGNOSIS — M659 Synovitis and tenosynovitis, unspecified: Secondary | ICD-10-CM | POA: Diagnosis not present

## 2012-08-14 DIAGNOSIS — S92309A Fracture of unspecified metatarsal bone(s), unspecified foot, initial encounter for closed fracture: Secondary | ICD-10-CM | POA: Diagnosis not present

## 2012-08-19 ENCOUNTER — Encounter: Payer: Self-pay | Admitting: Vascular Surgery

## 2012-08-20 ENCOUNTER — Encounter: Payer: Medicare Other | Admitting: Vascular Surgery

## 2012-08-20 ENCOUNTER — Telehealth: Payer: Self-pay | Admitting: Internal Medicine

## 2012-08-20 NOTE — Telephone Encounter (Signed)
FYI: Some one from [facility] called to let us know that Robert Clayton didn't come for his appt today.  They can't reach him by phone / no voice mail.

## 2012-08-20 NOTE — Progress Notes (Deleted)
Peripheral Vascular Disease (PVD) Peripheral vascular disease (PVD) is a disease caused by  narrowing in the arteries of the arms and legs. The arteries may become  "clogged with cholesterol or clots. This makes it hard for blood to flow to the hands and feet. CAUSES Causes of PVD can be many. It is usually associated with more than one risk factor such as:   High Cholesterol.   Smoking.   Diabetes.   Lack of exercise or inactivity.   High blood pressure (Hypertension).   Obesity.   Family history.  SYMPTOMS          Pain and/or cramping in legs with walking           Pain at night or at rest in arms or legs Loss of feeling (numbness) in your arms or legs.  Your arms or legs turn cold or blue.  You cannot move arms or legs You have redness, warmth, and puffiness (swelling) in your arms or legs.   DIAGNOSIS Diagnosis of PVD can involve several different types of tests. These can include:  An ABI (Ankle-Brachial Index) is calculated. measuring and comparing the blood pressure in the arms and legs.  This test is simple, painless and does not involve the use of X-rays   Angiogram. Is a procedure that uses x-rays to look at your blood vessels. This procedure is minimally invasive, meaning a small incision (cut) is made in your groin. A small tube (catheter) is then inserted into the artery of your groin. The catheter is guided to the blood vessel or artery your caregiver wants to examine. Contrast dye is injected into the catheter. X-rays are then taken of the blood vessel or artery. After the images are obtained, the catheter is taken out.  Surgical Intervention: IF INDICATED BY STUDIES AND SYMPTOMS  Angioplasty. An Angioplasty is a procedure that inflates a balloon in the blocked artery. This opens the blocked artery to improve blood flow.   Stent Implant. A wire mesh tube (stent) is placed in the artery. The stent expands and stays in place, allowing the artery to remain open.    Peripheral Bypass Surgery. This is a surgical procedure that reroutes the blood around a blocked artery to help improve blood flow. This type of procedure may be performed if Angioplasty or stent implants are not an option.  WHAT CAN I DO TO IMPROVE MY SYMPTOMS Quit smoking if you smoke.  Exercise as directed by your doctor.  Follow a low-fat, low-cholesterol, low carbohydrate diet as directed by your doctor.  Control your diabetes if you have diabetes.  Care for your feet. This can help prevent infection, ulcers and wounds that may not heal due to poor circulation.  Take all medicines as directed by your doctor. This is very important.  SEEK MEDICAL HELP RIGHT AWAY IF:  You have pain or lose feeling (numbness) in your arms or legs.   Your arms or legs turn cold or blue.   You have redness, warmth, and puffiness (swelling) in your arms or legs.

## 2012-08-21 NOTE — Progress Notes (Signed)
This encounter was created in error - please disregard.

## 2012-08-24 ENCOUNTER — Other Ambulatory Visit: Payer: Self-pay | Admitting: Internal Medicine

## 2012-09-02 ENCOUNTER — Encounter (HOSPITAL_COMMUNITY): Payer: Self-pay | Admitting: *Deleted

## 2012-09-02 ENCOUNTER — Emergency Department (HOSPITAL_COMMUNITY)
Admission: EM | Admit: 2012-09-02 | Discharge: 2012-09-02 | Disposition: A | Discharge status: Home / Self Care | Payer: Medicare Other | Attending: Emergency Medicine | Admitting: Emergency Medicine

## 2012-09-02 ENCOUNTER — Emergency Department (HOSPITAL_COMMUNITY): Payer: Medicare Other

## 2012-09-02 DIAGNOSIS — Z8546 Personal history of malignant neoplasm of prostate: Secondary | ICD-10-CM | POA: Insufficient documentation

## 2012-09-02 DIAGNOSIS — S0993XA Unspecified injury of face, initial encounter: Secondary | ICD-10-CM | POA: Diagnosis not present

## 2012-09-02 DIAGNOSIS — M109 Gout, unspecified: Secondary | ICD-10-CM | POA: Diagnosis not present

## 2012-09-02 DIAGNOSIS — Z79899 Other long term (current) drug therapy: Secondary | ICD-10-CM | POA: Insufficient documentation

## 2012-09-02 DIAGNOSIS — M25559 Pain in unspecified hip: Secondary | ICD-10-CM | POA: Diagnosis not present

## 2012-09-02 DIAGNOSIS — Z7982 Long term (current) use of aspirin: Secondary | ICD-10-CM | POA: Diagnosis not present

## 2012-09-02 DIAGNOSIS — E119 Type 2 diabetes mellitus without complications: Secondary | ICD-10-CM | POA: Diagnosis not present

## 2012-09-02 DIAGNOSIS — S0990XA Unspecified injury of head, initial encounter: Secondary | ICD-10-CM | POA: Insufficient documentation

## 2012-09-02 DIAGNOSIS — S43016A Anterior dislocation of unspecified humerus, initial encounter: Secondary | ICD-10-CM | POA: Diagnosis not present

## 2012-09-02 DIAGNOSIS — I129 Hypertensive chronic kidney disease with stage 1 through stage 4 chronic kidney disease, or unspecified chronic kidney disease: Secondary | ICD-10-CM | POA: Diagnosis not present

## 2012-09-02 DIAGNOSIS — Z23 Encounter for immunization: Secondary | ICD-10-CM | POA: Insufficient documentation

## 2012-09-02 DIAGNOSIS — I739 Peripheral vascular disease, unspecified: Secondary | ICD-10-CM | POA: Insufficient documentation

## 2012-09-02 DIAGNOSIS — Z8781 Personal history of (healed) traumatic fracture: Secondary | ICD-10-CM | POA: Diagnosis not present

## 2012-09-02 DIAGNOSIS — S43004A Unspecified dislocation of right shoulder joint, initial encounter: Secondary | ICD-10-CM

## 2012-09-02 DIAGNOSIS — S79919A Unspecified injury of unspecified hip, initial encounter: Secondary | ICD-10-CM | POA: Diagnosis not present

## 2012-09-02 DIAGNOSIS — W010XXA Fall on same level from slipping, tripping and stumbling without subsequent striking against object, initial encounter: Secondary | ICD-10-CM | POA: Insufficient documentation

## 2012-09-02 DIAGNOSIS — M25519 Pain in unspecified shoulder: Secondary | ICD-10-CM | POA: Diagnosis not present

## 2012-09-02 DIAGNOSIS — N181 Chronic kidney disease, stage 1: Secondary | ICD-10-CM | POA: Insufficient documentation

## 2012-09-02 DIAGNOSIS — E8881 Metabolic syndrome: Secondary | ICD-10-CM | POA: Insufficient documentation

## 2012-09-02 DIAGNOSIS — R6889 Other general symptoms and signs: Secondary | ICD-10-CM | POA: Diagnosis not present

## 2012-09-02 DIAGNOSIS — IMO0002 Reserved for concepts with insufficient information to code with codable children: Secondary | ICD-10-CM | POA: Diagnosis not present

## 2012-09-02 DIAGNOSIS — S4980XA Other specified injuries of shoulder and upper arm, unspecified arm, initial encounter: Secondary | ICD-10-CM | POA: Diagnosis not present

## 2012-09-02 DIAGNOSIS — Z888 Allergy status to other drugs, medicaments and biological substances status: Secondary | ICD-10-CM | POA: Insufficient documentation

## 2012-09-02 DIAGNOSIS — S5290XA Unspecified fracture of unspecified forearm, initial encounter for closed fracture: Secondary | ICD-10-CM | POA: Diagnosis not present

## 2012-09-02 DIAGNOSIS — E785 Hyperlipidemia, unspecified: Secondary | ICD-10-CM | POA: Diagnosis not present

## 2012-09-02 DIAGNOSIS — Y92009 Unspecified place in unspecified non-institutional (private) residence as the place of occurrence of the external cause: Secondary | ICD-10-CM | POA: Insufficient documentation

## 2012-09-02 DIAGNOSIS — Z794 Long term (current) use of insulin: Secondary | ICD-10-CM | POA: Diagnosis not present

## 2012-09-02 DIAGNOSIS — I2510 Atherosclerotic heart disease of native coronary artery without angina pectoris: Secondary | ICD-10-CM | POA: Diagnosis not present

## 2012-09-02 DIAGNOSIS — Y9301 Activity, walking, marching and hiking: Secondary | ICD-10-CM | POA: Insufficient documentation

## 2012-09-02 DIAGNOSIS — S0180XA Unspecified open wound of other part of head, initial encounter: Secondary | ICD-10-CM | POA: Diagnosis not present

## 2012-09-02 DIAGNOSIS — T148XXA Other injury of unspecified body region, initial encounter: Secondary | ICD-10-CM | POA: Diagnosis not present

## 2012-09-02 DIAGNOSIS — W19XXXA Unspecified fall, initial encounter: Secondary | ICD-10-CM

## 2012-09-02 DIAGNOSIS — S01111A Laceration without foreign body of right eyelid and periocular area, initial encounter: Secondary | ICD-10-CM

## 2012-09-02 LAB — CBC WITH DIFFERENTIAL/PLATELET
Basophils Absolute: 0 10*3/uL (ref 0.0–0.1)
Eosinophils Absolute: 0 10*3/uL (ref 0.0–0.7)
Eosinophils Relative: 0 % (ref 0–5)
HCT: 37.1 % — ABNORMAL LOW (ref 39.0–52.0)
MCH: 30.5 pg (ref 26.0–34.0)
MCHC: 34.2 g/dL (ref 30.0–36.0)
MCV: 89 fL (ref 78.0–100.0)
Monocytes Absolute: 0.8 10*3/uL (ref 0.1–1.0)
Platelets: 95 10*3/uL — ABNORMAL LOW (ref 150–400)
RDW: 14.2 % (ref 11.5–15.5)

## 2012-09-02 LAB — COMPREHENSIVE METABOLIC PANEL
ALT: 14 U/L (ref 0–53)
AST: 17 U/L (ref 0–37)
Alkaline Phosphatase: 69 U/L (ref 39–117)
CO2: 22 mEq/L (ref 19–32)
Chloride: 108 mEq/L (ref 96–112)
GFR calc Af Amer: 46 mL/min — ABNORMAL LOW (ref >90)
GFR calc non Af Amer: 40 mL/min — ABNORMAL LOW (ref >90)
Glucose, Bld: 297 mg/dL — ABNORMAL HIGH (ref 70–99)
Potassium: 5.3 mEq/L — ABNORMAL HIGH (ref 3.5–5.1)
Sodium: 141 mEq/L (ref 135–145)
Total Bilirubin: 0.4 mg/dL (ref 0.3–1.2)

## 2012-09-02 MED ORDER — SODIUM CHLORIDE 0.9 % IV SOLN
INTRAVENOUS | Status: AC | PRN
  Administered 2012-09-02: 1000 mL via INTRAVENOUS

## 2012-09-02 MED ORDER — FENTANYL CITRATE 0.05 MG/ML IJ SOLN
25.0000 ug | Freq: Once | INTRAMUSCULAR | Status: AC
  Administered 2012-09-02: 25 ug via INTRAVENOUS
  Filled 2012-09-02: qty 2

## 2012-09-02 MED ORDER — HYDROCODONE-ACETAMINOPHEN 5-325 MG PO TABS: 1.0000 | ORAL_TABLET | Freq: Four times a day (QID) | ORAL | Status: DC | PRN

## 2012-09-02 MED ORDER — TETANUS-DIPHTH-ACELL PERTUSSIS 5-2.5-18.5 LF-MCG/0.5 IM SUSP
0.5000 mL | Freq: Once | INTRAMUSCULAR | Status: AC
  Administered 2012-09-02: 0.5 mL via INTRAMUSCULAR
  Filled 2012-09-02: qty 0.5

## 2012-09-02 MED ORDER — PROPOFOL 10 MG/ML IV EMUL
INTRAVENOUS | Status: AC
  Filled 2012-09-02: qty 100

## 2012-09-02 MED ORDER — PROPOFOL 10 MG/ML IV BOLUS
INTRAVENOUS | Status: AC
  Filled 2012-09-02: qty 20

## 2012-09-02 MED ORDER — PROPOFOL 10 MG/ML IV BOLUS: 100.0000 mg | Freq: Once | INTRAVENOUS | Status: DC

## 2012-09-02 MED ORDER — PROPOFOL 10 MG/ML IV BOLUS
INTRAVENOUS | Status: AC | PRN
  Administered 2012-09-02: 40 mg via INTRAVENOUS

## 2012-09-02 MED ORDER — PROPOFOL 10 MG/ML IV BOLUS: 0.5000 mg/kg | Freq: Once | INTRAVENOUS | Status: DC

## 2012-09-02 NOTE — ED Notes (Signed)
Pt tripped & fell walking back from mailbox. Denies LOC. Lac to right eyelid, c/o pain right shoulder with obvious deformity.  EMS gave total fentanyl 284mcg with some relief.

## 2012-09-02 NOTE — ED Notes (Signed)
Patient transported to X-ray 

## 2012-09-02 NOTE — ED Notes (Signed)
Phlebotomy at bedside  Orthopedic tech at bedside

## 2012-09-02 NOTE — ED Notes (Signed)
Dr Lita Mains attempting rotation

## 2012-09-02 NOTE — ED Notes (Signed)
Pt alert and mentating appropriately. Pt alert and oriented. NAD noted at this time. Pt speaking in clear complete sentences. Pt able to move all extremities. Sling placed on right arm post procedure. Portable xray at bedside.

## 2012-09-02 NOTE — ED Notes (Signed)
Called lab to request redraw for CBC

## 2012-09-02 NOTE — ED Notes (Signed)
Phlebotomy in room to redraw CBC, orthotech in room

## 2012-09-02 NOTE — ED Notes (Signed)
Pt returning from X-ray and CT

## 2012-09-02 NOTE — Progress Notes (Signed)
Orthopedic Tech Progress Note Patient Details:  Robert Clayton 1930-10-06 [id number]  Ortho Devices Type of Ortho Device: Shoulder immobilizer Ortho Device/Splint Location: RUE Ortho Device/Splint Interventions: Ordered;Application   Braulio Bosch 09/02/2012, 4:35 PM
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
## 2012-09-02 NOTE — ED Notes (Signed)
Robert Clayton, [place] remains at bedside with respiratory at bedside  Robert Clayton, [place] suturing pts laceration

## 2012-09-02 NOTE — ED Notes (Signed)
Pt ambulated unassisted 75 feet or so with no c/o dizziness, gait steady.

## 2012-09-02 NOTE — ED Notes (Signed)
Phlebotomy in room. 

## 2012-09-02 NOTE — ED Notes (Signed)
Lab called and states they need an order for the re drawn CBC that was sent down

## 2012-09-02 NOTE — ED Notes (Signed)
Pt breathing freely; No respiratory distress noted Dr Lita Mains and Lorre Munroe, PA remain at bedside  [place], [place] attempting to repair laceration

## 2012-09-03 NOTE — ED Provider Notes (Signed)
Medical screening examination/treatment/procedure(s) were conducted as a shared visit with non-physician practitioner(s) and myself.  I personally evaluated the patient during the encounter Patient with trip and fall sustaining right eyelid laceration and right shoulder dislocation. Shoulder reduced in emergency department. Laceration repaired. Discussed with orthopedics and outpatient followup arranged. Patient signed out to the oncoming emergency physician pending laboratory workup.  Julianne Rice, MD 09/03/12 [phone]

## 2012-09-08 ENCOUNTER — Ambulatory Visit (INDEPENDENT_AMBULATORY_CARE_PROVIDER_SITE_OTHER): Payer: Medicare Other | Admitting: Internal Medicine

## 2012-09-08 ENCOUNTER — Other Ambulatory Visit: Payer: Self-pay

## 2012-09-08 ENCOUNTER — Encounter: Payer: Self-pay | Admitting: Internal Medicine

## 2012-09-08 VITALS — BP 130/68 | HR 76 | Temp 98.5°F | Resp 16 | Wt 203.2 lb

## 2012-09-08 DIAGNOSIS — D518 Other vitamin B12 deficiency anemias: Secondary | ICD-10-CM

## 2012-09-08 DIAGNOSIS — Z23 Encounter for immunization: Secondary | ICD-10-CM

## 2012-09-08 DIAGNOSIS — E1151 Type 2 diabetes mellitus with diabetic peripheral angiopathy without gangrene: Secondary | ICD-10-CM

## 2012-09-08 DIAGNOSIS — S0003XA Contusion of scalp, initial encounter: Secondary | ICD-10-CM | POA: Diagnosis not present

## 2012-09-08 DIAGNOSIS — S0083XA Contusion of other part of head, initial encounter: Secondary | ICD-10-CM

## 2012-09-08 DIAGNOSIS — S0083XS Contusion of other part of head, sequela: Secondary | ICD-10-CM | POA: Insufficient documentation

## 2012-09-08 MED ORDER — FREESTYLE SYSTEM KIT: 1.0000 | PACK | Freq: Two times a day (BID) | Status: DC

## 2012-09-08 MED ORDER — GLUCOSE BLOOD VI STRP: ORAL_STRIP | Status: DC

## 2012-09-08 NOTE — Assessment & Plan Note (Signed)
The sutures are not ready to be removed Otherwise he is healing well He sees ortho this week about his right shoulder

## 2012-09-08 NOTE — Patient Instructions (Signed)
Wound Check  Your wound appears healthy today. Your wound will heal gradually over time. Eventually a scar will form that will fade with time.  FACTORS THAT AFFECT SCAR FORMATION:   People differ in the severity in which they scar.   Scar severity varies according to location, size, and the traits you inherited from your parents (genetic predisposition).   Irritation to the wound from infection, rubbing, or chemical exposure will increase the amount of scar formation.  HOME CARE INSTRUCTIONS    If you were given a dressing, you should change it at least once a day or as instructed by your caregiver. If the bandage sticks, soak it off with a solution of hydrogen peroxide.   If the bandage becomes wet, dirty, or develops a bad smell, change it as soon as possible.   Look for signs of infection.   Only take over-the-counter or prescription medicines for pain, discomfort, or fever as directed by your caregiver.  SEEK IMMEDIATE MEDICAL CARE IF:    You have redness, swelling, or increasing pain in the wound.   You notice pus coming from the wound.   You have a fever.   You notice a bad smell coming from the wound or dressing.  Document Released: 09/30/2003 Document Revised: 03/18/2011 Document Reviewed: 12/24/2004  [employer] Patient Information 2014 [employer]

## 2012-09-08 NOTE — Telephone Encounter (Signed)
Freestyle monitoring kit request

## 2012-09-08 NOTE — Progress Notes (Signed)
Subjective:    Patient ID: Robert Clayton, male    DOB: 21-Apr-1930, 79 y.o.   MRN: [id number]  Wound Check He was originally treated 5 to 10 days ago. Previous treatment included laceration repair. There has been no drainage from the wound. There is no redness present. There is no swelling present. The pain has no pain. He has no difficulty moving the affected extremity or digit.      Review of Systems  Constitutional: Negative.   HENT: Negative.   Eyes: Negative.  Negative for visual disturbance.  Respiratory: Negative.  Negative for cough, chest tightness, shortness of breath, wheezing and stridor.   Cardiovascular: Negative.  Negative for chest pain, palpitations and leg swelling.  Gastrointestinal: Negative.  Negative for abdominal pain.  Endocrine: Negative.   Genitourinary: Negative.   Musculoskeletal: Positive for arthralgias (rt shoulder). Negative for myalgias, back pain, joint swelling and gait problem.  Skin: Positive for wound. Negative for color change, pallor and rash.  Allergic/Immunologic: Negative.   Neurological: Negative.  Negative for dizziness, seizures, syncope, facial asymmetry, speech difficulty, weakness, light-headedness and headaches.  Hematological: Negative.  Negative for adenopathy. Does not bruise/bleed easily.  Psychiatric/Behavioral: Negative.        Objective:   Physical Exam  Vitals reviewed. Constitutional: He is oriented to person, place, and time. He appears well-developed and well-nourished.  Non-toxic appearance. He does not have a sickly appearance. He does not appear ill. No distress.  HENT:  Head: Head is with raccoon's eyes, with contusion and with laceration. Head is without Battle's sign, without abrasion, without right periorbital erythema and without left periorbital erythema.  Right Ear: Hearing, tympanic membrane, external ear and ear canal normal. No hemotympanum.  Left Ear: Hearing, tympanic membrane, external ear and ear canal  normal. No hemotympanum.  Nose: Nose normal. No mucosal edema, rhinorrhea, nose lacerations, sinus tenderness, nasal deformity, septal deviation or nasal septal hematoma. No epistaxis.  No foreign bodies. Right sinus exhibits no maxillary sinus tenderness and no frontal sinus tenderness. Left sinus exhibits no maxillary sinus tenderness and no frontal sinus tenderness.  Eyes: Conjunctivae and EOM are normal. Pupils are equal, round, and reactive to light. Right eye exhibits no discharge. Left eye exhibits no discharge. Right conjunctiva is not injected. Right conjunctiva has no hemorrhage. Left conjunctiva is not injected. Left conjunctiva has no hemorrhage. No scleral icterus. Right eye exhibits normal extraocular motion and no nystagmus. Left eye exhibits normal extraocular motion and no nystagmus. Right pupil is round and reactive. Left pupil is round and reactive. Pupils are equal.    Neck: Normal range of motion. Neck supple. No JVD present. No tracheal deviation present. No thyromegaly present.  Cardiovascular: Normal rate, regular rhythm and intact distal pulses.  Exam reveals no gallop and no friction rub.   Murmur heard. Pulmonary/Chest: Effort normal and breath sounds normal. No stridor. No respiratory distress. He has no wheezes. He has no rales. He exhibits no tenderness.  Abdominal: Soft. Bowel sounds are normal. He exhibits no distension and no mass. There is no tenderness. There is no rebound and no guarding.  Musculoskeletal: Normal range of motion. He exhibits no edema and no tenderness.       Cervical back: Normal. He exhibits normal range of motion, no tenderness, no bony tenderness, no swelling, no edema and no deformity.  Lymphadenopathy:    He has no cervical adenopathy.  Neurological: He is oriented to person, place, and time.  Skin: Skin is warm and dry. No rash noted.  He is not diaphoretic. No erythema. No pallor.  Psychiatric: He has a normal mood and affect. His behavior is  normal. Judgment and thought content normal.          Assessment & Plan:

## 2012-09-10 DIAGNOSIS — S43016A Anterior dislocation of unspecified humerus, initial encounter: Secondary | ICD-10-CM | POA: Diagnosis not present

## 2012-09-11 ENCOUNTER — Telehealth: Payer: Self-pay

## 2012-09-11 ENCOUNTER — Other Ambulatory Visit (INDEPENDENT_AMBULATORY_CARE_PROVIDER_SITE_OTHER): Payer: Medicare Other

## 2012-09-11 ENCOUNTER — Encounter: Payer: Self-pay | Admitting: Internal Medicine

## 2012-09-11 ENCOUNTER — Ambulatory Visit (INDEPENDENT_AMBULATORY_CARE_PROVIDER_SITE_OTHER): Payer: Medicare Other | Admitting: Internal Medicine

## 2012-09-11 VITALS — BP 130/58 | HR 80 | Temp 98.7°F | Resp 16

## 2012-09-11 DIAGNOSIS — I129 Hypertensive chronic kidney disease with stage 1 through stage 4 chronic kidney disease, or unspecified chronic kidney disease: Secondary | ICD-10-CM

## 2012-09-11 DIAGNOSIS — E1159 Type 2 diabetes mellitus with other circulatory complications: Secondary | ICD-10-CM

## 2012-09-11 DIAGNOSIS — E1151 Type 2 diabetes mellitus with diabetic peripheral angiopathy without gangrene: Secondary | ICD-10-CM

## 2012-09-11 DIAGNOSIS — F068 Other specified mental disorders due to known physiological condition: Secondary | ICD-10-CM

## 2012-09-11 DIAGNOSIS — N189 Chronic kidney disease, unspecified: Secondary | ICD-10-CM | POA: Diagnosis not present

## 2012-09-11 DIAGNOSIS — I798 Other disorders of arteries, arterioles and capillaries in diseases classified elsewhere: Secondary | ICD-10-CM

## 2012-09-11 DIAGNOSIS — Z5189 Encounter for other specified aftercare: Secondary | ICD-10-CM

## 2012-09-11 DIAGNOSIS — F0390 Unspecified dementia without behavioral disturbance: Secondary | ICD-10-CM

## 2012-09-11 DIAGNOSIS — N181 Chronic kidney disease, stage 1: Secondary | ICD-10-CM

## 2012-09-11 DIAGNOSIS — S0083XD Contusion of other part of head, subsequent encounter: Secondary | ICD-10-CM

## 2012-09-11 LAB — BASIC METABOLIC PANEL
BUN: 31 mg/dL — ABNORMAL HIGH (ref 6–23)
Calcium: 9.5 mg/dL (ref 8.4–10.5)
Chloride: 103 mEq/L (ref 96–112)
Creatinine, Ser: 2 mg/dL — ABNORMAL HIGH (ref 0.4–1.5)

## 2012-09-11 LAB — HEMOGLOBIN A1C: Hgb A1c MFr Bld: 12.3 % — ABNORMAL HIGH (ref 4.6–6.5)

## 2012-09-11 NOTE — Patient Instructions (Signed)
Type 2 Diabetes Mellitus, Adult Type 2 diabetes mellitus, often simply referred to as type 2 diabetes, is a long-lasting (chronic) disease. In type 2 diabetes, the pancreas does not make enough insulin (a hormone), the cells are less responsive to the insulin that is made (insulin resistance), or both. Normally, insulin moves sugars from food into the tissue cells. The tissue cells use the sugars for energy. The lack of insulin or the lack of normal response to insulin causes excess sugars to build up in the blood instead of going into the tissue cells. As a result, high blood sugar (hyperglycemia) develops. The effect of high sugar (glucose) levels can cause many complications. Type 2 diabetes was also previously called adult-onset diabetes but it can occur at any age.  RISK FACTORS  A person is predisposed to developing type 2 diabetes if someone in the family has the disease and also has one or more of the following primary risk factors:  Overweight.  An inactive lifestyle.  A history of consistently eating high-calorie foods. Maintaining a normal weight and regular physical activity can reduce the chance of developing type 2 diabetes. SYMPTOMS  A person with type 2 diabetes may not show symptoms initially. The symptoms of type 2 diabetes appear slowly. The symptoms include:  Increased thirst (polydipsia).  Increased urination (polyuria).  Increased urination during the night (nocturia).  Weight loss. This weight loss may be rapid.  Frequent, recurring infections.  Tiredness (fatigue).  Weakness.  Vision changes, such as blurred vision.  Fruity smell to your breath.  Abdominal pain.  Nausea or vomiting.  Cuts or bruises which are slow to heal.  Tingling or numbness in the hands or feet. DIAGNOSIS Type 2 diabetes is frequently not diagnosed until complications of diabetes are present. Type 2 diabetes is diagnosed when symptoms or complications are present and when blood  glucose levels are increased. Your blood glucose level may be checked by one or more of the following blood tests:  A fasting blood glucose test. You will not be allowed to eat for at least 8 hours before a blood sample is taken.  A random blood glucose test. Your blood glucose is checked at any time of the day regardless of when you ate.  A hemoglobin A1c blood glucose test. A hemoglobin A1c test provides information about blood glucose control over the previous 3 months.  An oral glucose tolerance test (OGTT). Your blood glucose is measured after you have not eaten (fasted) for 2 hours and then after you drink a glucose-containing beverage. TREATMENT   You may need to take insulin or diabetes medicine daily to keep blood glucose levels in the desired range.  You will need to match insulin dosing with exercise and healthy food choices. The treatment goal is to maintain the before meal blood sugar (preprandial glucose) level at 70 130 mg/dL. HOME CARE INSTRUCTIONS   Have your hemoglobin A1c level checked twice a year.  Perform daily blood glucose monitoring as directed by your caregiver.  Monitor urine ketones when you are ill and as directed by your caregiver.  Take your diabetes medicine or insulin as directed by your caregiver to maintain your blood glucose levels in the desired range.  Never run out of diabetes medicine or insulin. It is needed every day.  Adjust insulin based on your intake of carbohydrates. Carbohydrates can raise blood glucose levels but need to be included in your diet. Carbohydrates provide vitamins, minerals, and fiber which are an essential part of   a healthy diet. Carbohydrates are found in fruits, vegetables, whole grains, dairy products, legumes, and foods containing added sugars.    Eat healthy foods. Alternate 3 meals with 3 snacks.  Lose weight if overweight.  Carry a medical alert card or wear your medical alert jewelry.  Carry a 15 gram  carbohydrate snack with you at all times to treat low blood glucose (hypoglycemia). Some examples of 15 gram carbohydrate snacks include:  Glucose tablets, 3 or 4   Glucose gel, 15 gram tube  Raisins, 2 tablespoons (24 grams)  Jelly beans, 6  Animal crackers, 8  Regular pop, 4 ounces (120 mL)  Gummy treats, 9  Recognize hypoglycemia. Hypoglycemia occurs with blood glucose levels of 70 mg/dL and below. The risk for hypoglycemia increases when fasting or skipping meals, during or after intense exercise, and during sleep. Hypoglycemia symptoms can include:  Tremors or shakes.  Decreased ability to concentrate.  Sweating.  Increased heart rate.  Headache.  Dry mouth.  Hunger.  Irritability.  Anxiety.  Restless sleep.  Altered speech or coordination.  Confusion.  Treat hypoglycemia promptly. If you are alert and able to safely swallow, follow the 15:15 rule:  Take 15 20 grams of rapid-acting glucose or carbohydrate. Rapid-acting options include glucose gel, glucose tablets, or 4 ounces (120 mL) of fruit juice, regular soda, or low fat milk.  Check your blood glucose level 15 minutes after taking the glucose.  Take 15 20 grams more of glucose if the repeat blood glucose level is still 70 mg/dL or below.  Eat a meal or snack within 1 hour once blood glucose levels return to normal.    Be alert to polyuria and polydipsia which are early signs of hyperglycemia. An early awareness of hyperglycemia allows for prompt treatment. Treat hyperglycemia as directed by your caregiver.  Engage in at least 150 minutes of moderate-intensity physical activity a week, spread over at least 3 days of the week or as directed by your caregiver. In addition, you should engage in resistance exercise at least 2 times a week or as directed by your caregiver.  Adjust your medicine and food intake as needed if you start a new exercise or sport.  Follow your sick day plan at any time you  are unable to eat or drink as usual.  Avoid tobacco use.  Limit alcohol intake to no more than 1 drink per day for nonpregnant women and 2 drinks per day for men. You should drink alcohol only when you are also eating food. Talk with your caregiver whether alcohol is safe for you. Tell your caregiver if you drink alcohol several times a week.  Follow up with your caregiver regularly.  Schedule an eye exam soon after the diagnosis of type 2 diabetes and then annually.  Perform daily skin and foot care. Examine your skin and feet daily for cuts, bruises, redness, nail problems, bleeding, blisters, or sores. A foot exam by a caregiver should be done annually.  Brush your teeth and gums at least twice a day and floss at least once a day. Follow up with your dentist regularly.  Share your diabetes management plan with your workplace or school.  Stay up-to-date with immunizations.  Learn to manage stress.  Obtain ongoing diabetes education and support as needed.  Participate in, or seek rehabilitation as needed to maintain or improve independence and quality of life. Request a physical or occupational therapy referral if you are having foot or hand numbness or difficulties with grooming,   dressing, eating, or physical activity. SEEK MEDICAL CARE IF:   You are unable to eat food or drink fluids for more than 6 hours.  You have nausea and vomiting for more than 6 hours.  Your blood glucose level is over 240 mg/dL.  There is a change in mental status.  You develop an additional serious illness.  You have diarrhea for more than 6 hours.  You have been sick or have had a fever for a couple of days and are not getting better.  You have pain during any physical activity.  SEEK IMMEDIATE MEDICAL CARE IF:  You have difficulty breathing.  You have moderate to large ketone levels. MAKE SURE YOU:  Understand these instructions.  Will watch your condition.  Will get help right away if  you are not doing well or get worse. Document Released: 12/24/2004 Document Revised: 09/18/2011 Document Reviewed: 07/23/2011 [employer] Patient Information 2014 [employer].  

## 2012-09-11 NOTE — Telephone Encounter (Signed)
See lab result note.

## 2012-09-11 NOTE — Telephone Encounter (Signed)
FYI..Tonya with [facility] called to report a critical glucose of 525. Thanks

## 2012-09-13 DIAGNOSIS — F0390 Unspecified dementia without behavioral disturbance: Secondary | ICD-10-CM | POA: Insufficient documentation

## 2012-09-13 NOTE — Progress Notes (Signed)
Subjective:    Patient ID: Robert Clayton, male    DOB: Sep 24, 1930, 79 y.o.   MRN: [id number]  Wound Check He was originally treated 10 to 14 days ago. Previous treatment included laceration repair. His temperature was unmeasured prior to arrival. There has been no drainage from the wound. There is no redness present. There is no swelling present. The pain has no pain. He has no difficulty moving the affected extremity or digit.      Review of Systems  Constitutional: Negative.  Negative for fever, chills, diaphoresis, appetite change and fatigue.  HENT: Negative.   Eyes: Negative.   Respiratory: Negative.  Negative for cough, chest tightness, shortness of breath, wheezing and stridor.   Cardiovascular: Negative.  Negative for chest pain, palpitations and leg swelling.  Gastrointestinal: Negative.  Negative for nausea, vomiting, abdominal pain, diarrhea and constipation.  Endocrine: Negative.   Genitourinary: Negative.   Musculoskeletal: Negative.   Skin: Negative.   Allergic/Immunologic: Negative.   Neurological: Negative.  Negative for dizziness, tremors, speech difficulty, weakness, light-headedness and headaches.  Hematological: Negative.  Negative for adenopathy. Does not bruise/bleed easily.  Psychiatric/Behavioral: Negative.        Objective:   Physical Exam  Vitals reviewed. Constitutional: He is oriented to person, place, and time. He appears well-developed and well-nourished. No distress.  HENT:  Head: Normocephalic. Head is with contusion. Head is without raccoon's eyes, without Battle's sign, without abrasion, without right periorbital erythema and without left periorbital erythema.  Right Ear: Hearing, tympanic membrane, external ear and ear canal normal. No hemotympanum.  Left Ear: Hearing, tympanic membrane, external ear and ear canal normal. No hemotympanum.  Mouth/Throat: Oropharynx is clear and moist. No oropharyngeal exudate.  Eyes: Conjunctivae are normal.  Right eye exhibits no discharge. Left eye exhibits no discharge. No scleral icterus.    Neck: Normal range of motion. Neck supple. No JVD present. No tracheal deviation present. No thyromegaly present.  Cardiovascular: Normal rate, regular rhythm, normal heart sounds and intact distal pulses.  Exam reveals no gallop and no friction rub.   No murmur heard. Pulmonary/Chest: Effort normal and breath sounds normal. No stridor. No respiratory distress. He has no wheezes. He has no rales. He exhibits no tenderness.  Abdominal: Soft. Bowel sounds are normal. He exhibits no distension and no mass. There is no tenderness. There is no rebound and no guarding.  Musculoskeletal: Normal range of motion. He exhibits no edema and no tenderness.  Lymphadenopathy:    He has no cervical adenopathy.  Neurological: He is alert and oriented to person, place, and time. He has normal reflexes.  Skin: Skin is warm and dry. No rash noted. He is not diaphoretic. No erythema. No pallor.  Psychiatric: He has a normal mood and affect. Judgment normal. His mood appears not anxious. His affect is not angry, not blunt, not labile and not inappropriate. His speech is delayed. His speech is not rapid and/or pressured, not tangential and not slurred. He is slowed. He is not agitated, not aggressive, not hyperactive, not withdrawn, not actively hallucinating and not combative. Cognition and memory are normal. Cognition and memory are not impaired. He does not exhibit a depressed mood. He is communicative. He exhibits normal recent memory and normal remote memory. He is inattentive.    Lab Results  Component Value Date   WBC 11.5* 09/02/2012   HGB 12.7* 09/02/2012   HCT 37.1* 09/02/2012   PLT 95* 09/02/2012   GLUCOSE 525* 09/11/2012   CHOL 137 06/08/2012  TRIG 147.0 06/08/2012   HDL 32.60* 06/08/2012   LDLDIRECT 80.6 06/26/2010   LDLCALC 75 06/08/2012   ALT 14 09/02/2012   AST 17 09/02/2012   NA 136 09/11/2012   K 4.8 09/11/2012   CL 103  09/11/2012   CREATININE 2.0* 09/11/2012   BUN 31* 09/11/2012   CO2 27 09/11/2012   TSH 1.70 06/08/2012   PSA 0.03* 06/29/2009   INR 1.1 04/08/2008   HGBA1C 12.3* 09/11/2012   MICROALBUR 1.7 03/23/2008        Assessment & Plan:

## 2012-09-13 NOTE — Assessment & Plan Note (Signed)
Neuro referral to consider treatment options for this

## 2012-09-13 NOTE — Assessment & Plan Note (Signed)
Suture was removed, the area is healing well

## 2012-09-13 NOTE — Assessment & Plan Note (Signed)
His bloods are too too high, I have asked him to RTC asap to address his insulin regimen and to see if he is checking his blood sugar Will also refer to diabetic education

## 2012-09-14 NOTE — ED Provider Notes (Signed)
Right shoulder production procedure note   Verbal consent obtained.  Time out immediately before the procedure.  Propofol given per nursing note.  Right shoulder reduced with traction and countertraction and external rotation.  Right shoulder immobilizer placed.  Good right radial pulses post procedure.  Patient tolerated the procedure well with no hypotension or hypoxia.  Reduction films show right shoulder in place with previously noted fractures.  Patient to followup with orthopedist.        Julianne Rice, MD 09/14/12 [phone]

## 2012-09-15 ENCOUNTER — Encounter (HOSPITAL_COMMUNITY): Payer: Self-pay | Admitting: Emergency Medicine

## 2012-09-15 ENCOUNTER — Telehealth: Payer: Self-pay

## 2012-09-15 ENCOUNTER — Emergency Department (HOSPITAL_COMMUNITY)
Admission: EM | Admit: 2012-09-15 | Discharge: 2012-09-15 | Disposition: A | Discharge status: Home / Self Care | Payer: Medicare Other | Attending: Emergency Medicine | Admitting: Emergency Medicine

## 2012-09-15 DIAGNOSIS — I129 Hypertensive chronic kidney disease with stage 1 through stage 4 chronic kidney disease, or unspecified chronic kidney disease: Secondary | ICD-10-CM | POA: Diagnosis not present

## 2012-09-15 DIAGNOSIS — Z8781 Personal history of (healed) traumatic fracture: Secondary | ICD-10-CM | POA: Diagnosis not present

## 2012-09-15 DIAGNOSIS — N181 Chronic kidney disease, stage 1: Secondary | ICD-10-CM | POA: Insufficient documentation

## 2012-09-15 DIAGNOSIS — I2510 Atherosclerotic heart disease of native coronary artery without angina pectoris: Secondary | ICD-10-CM | POA: Diagnosis not present

## 2012-09-15 DIAGNOSIS — Z8546 Personal history of malignant neoplasm of prostate: Secondary | ICD-10-CM | POA: Insufficient documentation

## 2012-09-15 DIAGNOSIS — E119 Type 2 diabetes mellitus without complications: Secondary | ICD-10-CM | POA: Insufficient documentation

## 2012-09-15 DIAGNOSIS — E785 Hyperlipidemia, unspecified: Secondary | ICD-10-CM | POA: Insufficient documentation

## 2012-09-15 DIAGNOSIS — Z794 Long term (current) use of insulin: Secondary | ICD-10-CM | POA: Diagnosis not present

## 2012-09-15 DIAGNOSIS — M109 Gout, unspecified: Secondary | ICD-10-CM | POA: Diagnosis not present

## 2012-09-15 DIAGNOSIS — R739 Hyperglycemia, unspecified: Secondary | ICD-10-CM

## 2012-09-15 DIAGNOSIS — Z79899 Other long term (current) drug therapy: Secondary | ICD-10-CM | POA: Insufficient documentation

## 2012-09-15 DIAGNOSIS — Z7982 Long term (current) use of aspirin: Secondary | ICD-10-CM | POA: Insufficient documentation

## 2012-09-15 LAB — COMPREHENSIVE METABOLIC PANEL
ALT: 6 U/L (ref 0–53)
AST: 10 U/L (ref 0–37)
Albumin: 3.6 g/dL (ref 3.5–5.2)
Calcium: 9.6 mg/dL (ref 8.4–10.5)
Creatinine, Ser: 1.63 mg/dL — ABNORMAL HIGH (ref 0.50–1.35)
GFR calc non Af Amer: 38 mL/min — ABNORMAL LOW (ref >90)
Sodium: 138 mEq/L (ref 135–145)
Total Protein: 7.3 g/dL (ref 6.0–8.3)

## 2012-09-15 LAB — URINE MICROSCOPIC-ADD ON

## 2012-09-15 LAB — URINALYSIS, ROUTINE W REFLEX MICROSCOPIC
Glucose, UA: >1000 mg/dL — AB
Hgb urine dipstick: NEGATIVE
Ketones, ur: NEGATIVE mg/dL
Protein, ur: NEGATIVE mg/dL
Urobilinogen, UA: 1 mg/dL (ref 0.0–1.0)

## 2012-09-15 LAB — CBC WITH DIFFERENTIAL/PLATELET
Basophils Absolute: 0 10*3/uL (ref 0.0–0.1)
Basophils Relative: 0 % (ref 0–1)
Eosinophils Absolute: 0.2 10*3/uL (ref 0.0–0.7)
Eosinophils Relative: 1 % (ref 0–5)
HCT: 35.4 % — ABNORMAL LOW (ref 39.0–52.0)
MCHC: 34.2 g/dL (ref 30.0–36.0)
MCV: 90.8 fL (ref 78.0–100.0)
Monocytes Absolute: 0.7 10*3/uL (ref 0.1–1.0)
Platelets: 157 10*3/uL (ref 150–400)
RDW: 14.2 % (ref 11.5–15.5)
WBC: 10.7 10*3/uL — ABNORMAL HIGH (ref 4.0–10.5)

## 2012-09-15 LAB — GLUCOSE, CAPILLARY: Glucose-Capillary: 337 mg/dL — ABNORMAL HIGH (ref 70–99)

## 2012-09-15 NOTE — Telephone Encounter (Signed)
Followed up with patient from Friday result note. Pt advised of elevated glucose of 525, also asked if he has checked CBG since Friday. Patient has not checked CBG, can not pay for meter rx until Wednesday, and MD out this pm. Pt advised to report to nearest ED, gave verbal understanding.

## 2012-09-15 NOTE — ED Notes (Signed)
Family at bedside. 

## 2012-09-15 NOTE — ED Notes (Signed)
Pt c/o hyperglycemia; pt sts not feeling well and fall 1 week ago that pt was seen for; pt sts home health told to come to hospital

## 2012-09-15 NOTE — ED Notes (Signed)
Pt states that on Monday his MD's office called to say that his blood sugar was in the 500s and to go to the ER. Upon arrival to the ED pt's blood sugars were in the 300s. Pt states he does not feel any symptoms. No Kussmaul respirations were assessed.
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
## 2012-09-16 NOTE — ED Provider Notes (Signed)
I evaluated the patient and discussed management with Dr. Marshell Levan.  I agree with the history, physical, assessment, and plan of care.   I saw the relevant studies.  On my exam the patient was pleasant, in no distress, hemodynamically stable.  Patient's blood sugar here is 300, but no evidence of significant electrolyte abnormalities beyond that.  We worked with our social services team to ensure access to appropriate medication and testing equipment, the patient was discharged in stable condition.  Carmin Muskrat, MD 09/16/12 [phone]

## 2012-09-18 ENCOUNTER — Other Ambulatory Visit: Payer: Self-pay

## 2012-09-18 DIAGNOSIS — E1151 Type 2 diabetes mellitus with diabetic peripheral angiopathy without gangrene: Secondary | ICD-10-CM

## 2012-09-18 MED ORDER — FREESTYLE SYSTEM KIT: 1.0000 | PACK | Freq: Two times a day (BID) | Status: DC

## 2012-09-18 MED ORDER — GLUCOSE BLOOD VI STRP: ORAL_STRIP | Status: DC

## 2012-09-18 MED ORDER — FREESTYLE LANCETS MISC: Status: DC

## 2012-09-18 NOTE — Telephone Encounter (Signed)
Received fax from pharmacy request new Rx for diabetic supplies with an associated DX.

## 2012-09-23 DIAGNOSIS — S43016A Anterior dislocation of unspecified humerus, initial encounter: Secondary | ICD-10-CM | POA: Diagnosis not present

## 2012-09-25 ENCOUNTER — Ambulatory Visit (INDEPENDENT_AMBULATORY_CARE_PROVIDER_SITE_OTHER): Payer: Medicare Other | Admitting: Endocrinology

## 2012-09-25 ENCOUNTER — Encounter: Payer: Self-pay | Admitting: Endocrinology

## 2012-09-25 VITALS — BP 132/70 | HR 77 | Ht 71.0 in | Wt 205.0 lb

## 2012-09-25 DIAGNOSIS — I798 Other disorders of arteries, arterioles and capillaries in diseases classified elsewhere: Secondary | ICD-10-CM

## 2012-09-25 DIAGNOSIS — E1151 Type 2 diabetes mellitus with diabetic peripheral angiopathy without gangrene: Secondary | ICD-10-CM

## 2012-09-25 DIAGNOSIS — E1159 Type 2 diabetes mellitus with other circulatory complications: Secondary | ICD-10-CM

## 2012-09-25 MED ORDER — GLUCOSE BLOOD VI STRP: 1.0000 | ORAL_STRIP | Freq: Two times a day (BID) | Status: DC

## 2012-09-25 NOTE — Patient Instructions (Addendum)
Here is a new meter.  i have sent a prescription to your pharmacy, for strips. check your blood sugar twice a day.  vary the time of day when you check, between before the 3 meals, and at bedtime.  also check if you have symptoms of your blood sugar being too high or too low.  please keep a record of the readings and bring it to your next appointment here.  please call us sooner if your blood sugar goes below 70, or if you have a lot of readings over 200.   Please stop taking the tradjenta.  Please come back for a follow-up appointment in 1 week.
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
## 2012-09-29 DIAGNOSIS — M6281 Muscle weakness (generalized): Secondary | ICD-10-CM | POA: Diagnosis not present

## 2012-09-29 DIAGNOSIS — M25619 Stiffness of unspecified shoulder, not elsewhere classified: Secondary | ICD-10-CM | POA: Diagnosis not present

## 2012-09-29 DIAGNOSIS — M67919 Unspecified disorder of synovium and tendon, unspecified shoulder: Secondary | ICD-10-CM | POA: Diagnosis not present

## 2012-10-02 DIAGNOSIS — M25619 Stiffness of unspecified shoulder, not elsewhere classified: Secondary | ICD-10-CM | POA: Diagnosis not present

## 2012-10-02 DIAGNOSIS — M67919 Unspecified disorder of synovium and tendon, unspecified shoulder: Secondary | ICD-10-CM | POA: Diagnosis not present

## 2012-10-02 DIAGNOSIS — M25519 Pain in unspecified shoulder: Secondary | ICD-10-CM | POA: Diagnosis not present

## 2012-10-02 DIAGNOSIS — M6281 Muscle weakness (generalized): Secondary | ICD-10-CM | POA: Diagnosis not present

## 2012-10-05 ENCOUNTER — Encounter: Payer: Self-pay | Admitting: Endocrinology

## 2012-10-05 ENCOUNTER — Ambulatory Visit (INDEPENDENT_AMBULATORY_CARE_PROVIDER_SITE_OTHER): Payer: Medicare Other | Admitting: Endocrinology

## 2012-10-05 VITALS — BP 126/80 | HR 64 | Wt 208.0 lb

## 2012-10-05 DIAGNOSIS — E1151 Type 2 diabetes mellitus with diabetic peripheral angiopathy without gangrene: Secondary | ICD-10-CM

## 2012-10-05 DIAGNOSIS — M6281 Muscle weakness (generalized): Secondary | ICD-10-CM | POA: Diagnosis not present

## 2012-10-05 DIAGNOSIS — I798 Other disorders of arteries, arterioles and capillaries in diseases classified elsewhere: Secondary | ICD-10-CM

## 2012-10-05 DIAGNOSIS — M25519 Pain in unspecified shoulder: Secondary | ICD-10-CM | POA: Diagnosis not present

## 2012-10-05 DIAGNOSIS — E1159 Type 2 diabetes mellitus with other circulatory complications: Secondary | ICD-10-CM

## 2012-10-05 DIAGNOSIS — M25619 Stiffness of unspecified shoulder, not elsewhere classified: Secondary | ICD-10-CM | POA: Diagnosis not present

## 2012-10-05 DIAGNOSIS — M67919 Unspecified disorder of synovium and tendon, unspecified shoulder: Secondary | ICD-10-CM | POA: Diagnosis not present
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
## 2012-10-05 NOTE — Patient Instructions (Addendum)
check your blood sugar twice a day.  vary the time of day when you check, between before the 3 meals, and at bedtime.  also check if you have symptoms of your blood sugar being too high or too low.  please keep a record of the readings and bring it to your next appointment here.  please call us sooner if your blood sugar goes below 70, or if you have a lot of readings over 200.   Please come back for a follow-up appointment in 1 month.

## 2012-10-09 ENCOUNTER — Ambulatory Visit (INDEPENDENT_AMBULATORY_CARE_PROVIDER_SITE_OTHER): Payer: Medicare Other

## 2012-10-09 ENCOUNTER — Ambulatory Visit: Payer: Medicare Other

## 2012-10-09 DIAGNOSIS — D518 Other vitamin B12 deficiency anemias: Secondary | ICD-10-CM | POA: Diagnosis not present

## 2012-10-09 MED ORDER — CYANOCOBALAMIN 1000 MCG/ML IJ SOLN
1000.0000 ug | Freq: Once | INTRAMUSCULAR | Status: AC
  Administered 2012-10-09: 1000 ug via INTRAMUSCULAR

## 2012-10-12 ENCOUNTER — Ambulatory Visit: Payer: Medicare Other | Admitting: Internal Medicine

## 2012-10-12 DIAGNOSIS — M6281 Muscle weakness (generalized): Secondary | ICD-10-CM | POA: Diagnosis not present

## 2012-10-12 DIAGNOSIS — M67919 Unspecified disorder of synovium and tendon, unspecified shoulder: Secondary | ICD-10-CM | POA: Diagnosis not present

## 2012-10-12 DIAGNOSIS — M25519 Pain in unspecified shoulder: Secondary | ICD-10-CM | POA: Diagnosis not present

## 2012-10-12 DIAGNOSIS — M25619 Stiffness of unspecified shoulder, not elsewhere classified: Secondary | ICD-10-CM | POA: Diagnosis not present

## 2012-10-15 DIAGNOSIS — M25619 Stiffness of unspecified shoulder, not elsewhere classified: Secondary | ICD-10-CM | POA: Diagnosis not present

## 2012-10-15 DIAGNOSIS — M6281 Muscle weakness (generalized): Secondary | ICD-10-CM | POA: Diagnosis not present

## 2012-10-15 DIAGNOSIS — M67919 Unspecified disorder of synovium and tendon, unspecified shoulder: Secondary | ICD-10-CM | POA: Diagnosis not present

## 2012-10-15 DIAGNOSIS — M25519 Pain in unspecified shoulder: Secondary | ICD-10-CM | POA: Diagnosis not present

## 2012-10-17 ENCOUNTER — Other Ambulatory Visit: Payer: Self-pay | Admitting: Internal Medicine

## 2012-10-19 DIAGNOSIS — M25519 Pain in unspecified shoulder: Secondary | ICD-10-CM | POA: Diagnosis not present

## 2012-10-19 DIAGNOSIS — M6281 Muscle weakness (generalized): Secondary | ICD-10-CM | POA: Diagnosis not present

## 2012-10-19 DIAGNOSIS — M25619 Stiffness of unspecified shoulder, not elsewhere classified: Secondary | ICD-10-CM | POA: Diagnosis not present

## 2012-10-19 DIAGNOSIS — M67919 Unspecified disorder of synovium and tendon, unspecified shoulder: Secondary | ICD-10-CM | POA: Diagnosis not present

## 2012-10-21 DIAGNOSIS — S43016A Anterior dislocation of unspecified humerus, initial encounter: Secondary | ICD-10-CM | POA: Diagnosis not present

## 2012-10-22 DIAGNOSIS — M25519 Pain in unspecified shoulder: Secondary | ICD-10-CM | POA: Diagnosis not present

## 2012-10-22 DIAGNOSIS — M6281 Muscle weakness (generalized): Secondary | ICD-10-CM | POA: Diagnosis not present

## 2012-10-22 DIAGNOSIS — M25619 Stiffness of unspecified shoulder, not elsewhere classified: Secondary | ICD-10-CM | POA: Diagnosis not present

## 2012-10-22 DIAGNOSIS — M67919 Unspecified disorder of synovium and tendon, unspecified shoulder: Secondary | ICD-10-CM | POA: Diagnosis not present

## 2012-10-23 ENCOUNTER — Other Ambulatory Visit: Payer: Self-pay | Admitting: Internal Medicine

## 2012-10-23 ENCOUNTER — Other Ambulatory Visit: Payer: Self-pay

## 2012-10-27 DIAGNOSIS — M67919 Unspecified disorder of synovium and tendon, unspecified shoulder: Secondary | ICD-10-CM | POA: Diagnosis not present

## 2012-10-27 DIAGNOSIS — M6281 Muscle weakness (generalized): Secondary | ICD-10-CM | POA: Diagnosis not present

## 2012-10-27 DIAGNOSIS — M25519 Pain in unspecified shoulder: Secondary | ICD-10-CM | POA: Diagnosis not present

## 2012-10-27 DIAGNOSIS — M25619 Stiffness of unspecified shoulder, not elsewhere classified: Secondary | ICD-10-CM | POA: Diagnosis not present

## 2012-10-30 DIAGNOSIS — M25519 Pain in unspecified shoulder: Secondary | ICD-10-CM | POA: Diagnosis not present

## 2012-10-30 DIAGNOSIS — M67919 Unspecified disorder of synovium and tendon, unspecified shoulder: Secondary | ICD-10-CM | POA: Diagnosis not present

## 2012-10-30 DIAGNOSIS — M6281 Muscle weakness (generalized): Secondary | ICD-10-CM | POA: Diagnosis not present

## 2012-10-30 DIAGNOSIS — M25619 Stiffness of unspecified shoulder, not elsewhere classified: Secondary | ICD-10-CM | POA: Diagnosis not present

## 2012-11-04 ENCOUNTER — Ambulatory Visit (INDEPENDENT_AMBULATORY_CARE_PROVIDER_SITE_OTHER): Payer: Medicare Other | Admitting: Endocrinology

## 2012-11-04 ENCOUNTER — Encounter: Payer: Self-pay | Admitting: Endocrinology

## 2012-11-04 VITALS — BP 120/82 | Temp 97.8°F | Wt 210.1 lb

## 2012-11-04 DIAGNOSIS — I798 Other disorders of arteries, arterioles and capillaries in diseases classified elsewhere: Secondary | ICD-10-CM | POA: Diagnosis not present

## 2012-11-04 DIAGNOSIS — E1159 Type 2 diabetes mellitus with other circulatory complications: Secondary | ICD-10-CM

## 2012-11-04 DIAGNOSIS — E1151 Type 2 diabetes mellitus with diabetic peripheral angiopathy without gangrene: Secondary | ICD-10-CM

## 2012-11-04 MED ORDER — INSULIN LISPRO PROT & LISPRO (75-25 MIX) 100 UNIT/ML KWIKPEN: PEN_INJECTOR | SUBCUTANEOUS | Status: DC

## 2012-11-04 NOTE — Patient Instructions (Addendum)
check your blood sugar twice a day.  vary the time of day when you check, between before the 3 meals, and at bedtime.  also check if you have symptoms of your blood sugar being too high or too low.  please keep a record of the readings and bring it to your next appointment here.  please call us sooner if your blood sugar goes below 70, or if you have a lot of readings over 200.   Please change both current insulins to "75/25," 60 units with breakfast, and 40 units with the evening meal. Please come back for a follow-up appointment in 1 month.
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
## 2012-11-10 ENCOUNTER — Ambulatory Visit (INDEPENDENT_AMBULATORY_CARE_PROVIDER_SITE_OTHER): Payer: Medicare Other

## 2012-11-10 DIAGNOSIS — D518 Other vitamin B12 deficiency anemias: Secondary | ICD-10-CM | POA: Diagnosis not present

## 2012-11-10 MED ORDER — CYANOCOBALAMIN 1000 MCG/ML IJ SOLN
1000.0000 ug | Freq: Once | INTRAMUSCULAR | Status: AC
  Administered 2012-11-10: 1000 ug via INTRAMUSCULAR

## 2012-12-09 ENCOUNTER — Ambulatory Visit (INDEPENDENT_AMBULATORY_CARE_PROVIDER_SITE_OTHER): Payer: Medicare Other | Admitting: Endocrinology

## 2012-12-09 ENCOUNTER — Encounter: Payer: Self-pay | Admitting: Endocrinology

## 2012-12-09 VITALS — BP 122/62 | HR 80 | Temp 97.5°F | Ht 71.0 in | Wt 211.0 lb

## 2012-12-09 DIAGNOSIS — E1151 Type 2 diabetes mellitus with diabetic peripheral angiopathy without gangrene: Secondary | ICD-10-CM

## 2012-12-09 DIAGNOSIS — E1159 Type 2 diabetes mellitus with other circulatory complications: Secondary | ICD-10-CM

## 2012-12-09 DIAGNOSIS — IMO0002 Reserved for concepts with insufficient information to code with codable children: Secondary | ICD-10-CM

## 2012-12-09 DIAGNOSIS — I798 Other disorders of arteries, arterioles and capillaries in diseases classified elsewhere: Secondary | ICD-10-CM

## 2012-12-09 DIAGNOSIS — D518 Other vitamin B12 deficiency anemias: Secondary | ICD-10-CM | POA: Diagnosis not present

## 2012-12-09 MED ORDER — CYANOCOBALAMIN 1000 MCG/ML IJ SOLN
1000.0000 ug | Freq: Once | INTRAMUSCULAR | Status: AC
  Administered 2012-12-09: 1000 ug via INTRAMUSCULAR

## 2012-12-09 NOTE — Patient Instructions (Addendum)
check your blood sugar twice a day.  vary the time of day when you check, between before the 3 meals, and at bedtime.  also check if you have symptoms of your blood sugar being too high or too low.  please keep a record of the readings and bring it to your next appointment here.  please call us sooner if your blood sugar goes below 70, or if you have a lot of readings over 200.   Please change the "75/25" insulin to 70 units with breakfast, and 35 units with the evening meal. Please come back for a follow-up appointment in 2 months.
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
## 2012-12-10 ENCOUNTER — Ambulatory Visit: Payer: Medicare Other

## 2013-01-11 ENCOUNTER — Ambulatory Visit (INDEPENDENT_AMBULATORY_CARE_PROVIDER_SITE_OTHER): Payer: Medicare Other

## 2013-01-11 DIAGNOSIS — D518 Other vitamin B12 deficiency anemias: Secondary | ICD-10-CM | POA: Diagnosis not present

## 2013-01-11 MED ORDER — CYANOCOBALAMIN 1000 MCG/ML IJ SOLN
1000.0000 ug | Freq: Once | INTRAMUSCULAR | Status: AC
  Administered 2013-01-11: 1000 ug via INTRAMUSCULAR

## 2013-01-12 ENCOUNTER — Ambulatory Visit: Payer: Medicare Other | Admitting: Internal Medicine

## 2013-01-29 ENCOUNTER — Telehealth: Payer: Self-pay

## 2013-01-29 NOTE — Telephone Encounter (Signed)
The patient is hoping to get samples of insulin

## 2013-02-02 NOTE — Telephone Encounter (Signed)
Returned call to patient and advised that I am unable to supply lantus due to no longer on his medication list. I stated that I can privide humalog 75-25 and he will need to contact Dr. Loanne Drilling to discuss medication further. Pt gave verbal understanding.

## 2013-02-12 ENCOUNTER — Ambulatory Visit: Payer: Medicare Other

## 2013-02-12 ENCOUNTER — Ambulatory Visit: Payer: Medicare Other | Admitting: Endocrinology

## 2013-02-12 DIAGNOSIS — Z0289 Encounter for other administrative examinations: Secondary | ICD-10-CM

## 2013-02-22 ENCOUNTER — Telehealth: Payer: Self-pay | Admitting: Internal Medicine

## 2013-02-22 ENCOUNTER — Ambulatory Visit (INDEPENDENT_AMBULATORY_CARE_PROVIDER_SITE_OTHER): Payer: Medicare Other | Admitting: *Deleted

## 2013-02-22 DIAGNOSIS — E538 Deficiency of other specified B group vitamins: Secondary | ICD-10-CM | POA: Diagnosis not present

## 2013-02-22 MED ORDER — CYANOCOBALAMIN 1000 MCG/ML IJ SOLN
1000.0000 ug | Freq: Once | INTRAMUSCULAR | Status: AC
  Administered 2013-02-22: 1000 ug via INTRAMUSCULAR

## 2013-02-22 NOTE — Telephone Encounter (Signed)
Requesting samples of insulin.  He uses a pen. humalog and alsal?.

## 2013-03-17 ENCOUNTER — Encounter: Payer: Self-pay | Admitting: Endocrinology

## 2013-03-17 ENCOUNTER — Ambulatory Visit (INDEPENDENT_AMBULATORY_CARE_PROVIDER_SITE_OTHER): Payer: Medicare Other | Admitting: Endocrinology

## 2013-03-17 VITALS — BP 134/90 | HR 81 | Temp 98.5°F | Ht 71.0 in | Wt 211.0 lb

## 2013-03-17 DIAGNOSIS — E1159 Type 2 diabetes mellitus with other circulatory complications: Secondary | ICD-10-CM | POA: Diagnosis not present

## 2013-03-17 DIAGNOSIS — E1165 Type 2 diabetes mellitus with hyperglycemia: Principal | ICD-10-CM

## 2013-03-17 DIAGNOSIS — E1151 Type 2 diabetes mellitus with diabetic peripheral angiopathy without gangrene: Secondary | ICD-10-CM

## 2013-03-17 DIAGNOSIS — IMO0002 Reserved for concepts with insufficient information to code with codable children: Secondary | ICD-10-CM

## 2013-03-17 LAB — HEMOGLOBIN A1C: HEMOGLOBIN A1C: 9.7 % — AB (ref 4.6–6.5)

## 2013-03-17 NOTE — Patient Instructions (Addendum)
check your blood sugar twice a day.  vary the time of day when you check, between before the 3 meals, and at bedtime.  also check if you have symptoms of your blood sugar being too high or too low.  please keep a record of the readings and bring it to your next appointment here.  please call us sooner if your blood sugar goes below 70, or if you have a lot of readings over 200.   Please come back for a follow-up appointment in 3 months.  blood tests are being requested for you today.  We'll contact you with results.  Based on the results, i believe we will need to increase the insulin.  i'll let you know.
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
## 2013-03-18 ENCOUNTER — Other Ambulatory Visit: Payer: Self-pay

## 2013-03-18 MED ORDER — INSULIN LISPRO PROT & LISPRO (75-25 MIX) 100 UNIT/ML KWIKPEN: PEN_INJECTOR | SUBCUTANEOUS | Status: DC

## 2013-03-19 ENCOUNTER — Other Ambulatory Visit: Payer: Self-pay | Admitting: Internal Medicine

## 2013-03-22 ENCOUNTER — Ambulatory Visit (INDEPENDENT_AMBULATORY_CARE_PROVIDER_SITE_OTHER): Payer: Medicare Other | Admitting: *Deleted

## 2013-03-22 DIAGNOSIS — D518 Other vitamin B12 deficiency anemias: Secondary | ICD-10-CM

## 2013-03-22 MED ORDER — CYANOCOBALAMIN 1000 MCG/ML IJ SOLN
1000.0000 ug | Freq: Once | INTRAMUSCULAR | Status: AC
  Administered 2013-03-22: 1000 ug via INTRAMUSCULAR

## 2013-04-01 ENCOUNTER — Other Ambulatory Visit: Payer: Self-pay

## 2013-04-01 MED ORDER — INSULIN LISPRO PROT & LISPRO (75-25 MIX) 100 UNIT/ML KWIKPEN: PEN_INJECTOR | SUBCUTANEOUS | Status: DC

## 2013-04-23 ENCOUNTER — Ambulatory Visit (INDEPENDENT_AMBULATORY_CARE_PROVIDER_SITE_OTHER): Payer: Medicare Other

## 2013-04-23 DIAGNOSIS — D518 Other vitamin B12 deficiency anemias: Secondary | ICD-10-CM | POA: Diagnosis not present

## 2013-04-23 DIAGNOSIS — D519 Vitamin B12 deficiency anemia, unspecified: Secondary | ICD-10-CM

## 2013-04-23 MED ORDER — CYANOCOBALAMIN 1000 MCG/ML IJ SOLN
1000.0000 ug | Freq: Once | INTRAMUSCULAR | Status: AC
  Administered 2013-04-23: 1000 ug via INTRAMUSCULAR

## 2013-04-30 ENCOUNTER — Emergency Department (HOSPITAL_COMMUNITY): Payer: Medicare Other

## 2013-04-30 ENCOUNTER — Inpatient Hospital Stay (HOSPITAL_COMMUNITY)
Admission: EM | Admit: 2013-04-30 | Discharge: 2013-05-04 | DRG: 854 | Disposition: A | Discharge status: Home / Self Care | Payer: Medicare Other | Attending: Internal Medicine | Admitting: Internal Medicine

## 2013-04-30 ENCOUNTER — Other Ambulatory Visit: Payer: Medicare Other

## 2013-04-30 ENCOUNTER — Encounter (HOSPITAL_COMMUNITY): Payer: Self-pay | Admitting: Emergency Medicine

## 2013-04-30 ENCOUNTER — Encounter: Payer: Self-pay | Admitting: Internal Medicine

## 2013-04-30 ENCOUNTER — Ambulatory Visit (INDEPENDENT_AMBULATORY_CARE_PROVIDER_SITE_OTHER): Payer: Medicare Other | Admitting: Internal Medicine

## 2013-04-30 VITALS — BP 116/58 | HR 112 | Temp 98.0°F | Resp 18 | Wt 204.4 lb

## 2013-04-30 DIAGNOSIS — R319 Hematuria, unspecified: Secondary | ICD-10-CM | POA: Diagnosis not present

## 2013-04-30 DIAGNOSIS — Z8546 Personal history of malignant neoplasm of prostate: Secondary | ICD-10-CM | POA: Diagnosis not present

## 2013-04-30 DIAGNOSIS — Z79899 Other long term (current) drug therapy: Secondary | ICD-10-CM

## 2013-04-30 DIAGNOSIS — N189 Chronic kidney disease, unspecified: Secondary | ICD-10-CM | POA: Diagnosis not present

## 2013-04-30 DIAGNOSIS — I739 Peripheral vascular disease, unspecified: Secondary | ICD-10-CM | POA: Diagnosis present

## 2013-04-30 DIAGNOSIS — E785 Hyperlipidemia, unspecified: Secondary | ICD-10-CM | POA: Diagnosis present

## 2013-04-30 DIAGNOSIS — J9819 Other pulmonary collapse: Secondary | ICD-10-CM | POA: Diagnosis not present

## 2013-04-30 DIAGNOSIS — A419 Sepsis, unspecified organism: Principal | ICD-10-CM | POA: Diagnosis present

## 2013-04-30 DIAGNOSIS — N183 Chronic kidney disease, stage 3 unspecified: Secondary | ICD-10-CM | POA: Diagnosis present

## 2013-04-30 DIAGNOSIS — R739 Hyperglycemia, unspecified: Secondary | ICD-10-CM

## 2013-04-30 DIAGNOSIS — E8881 Metabolic syndrome: Secondary | ICD-10-CM | POA: Diagnosis not present

## 2013-04-30 DIAGNOSIS — R5381 Other malaise: Secondary | ICD-10-CM | POA: Diagnosis present

## 2013-04-30 DIAGNOSIS — I129 Hypertensive chronic kidney disease with stage 1 through stage 4 chronic kidney disease, or unspecified chronic kidney disease: Secondary | ICD-10-CM | POA: Diagnosis present

## 2013-04-30 DIAGNOSIS — K560 Paralytic ileus: Secondary | ICD-10-CM

## 2013-04-30 DIAGNOSIS — E1159 Type 2 diabetes mellitus with other circulatory complications: Secondary | ICD-10-CM | POA: Diagnosis not present

## 2013-04-30 DIAGNOSIS — K8000 Calculus of gallbladder with acute cholecystitis without obstruction: Secondary | ICD-10-CM | POA: Diagnosis not present

## 2013-04-30 DIAGNOSIS — M109 Gout, unspecified: Secondary | ICD-10-CM | POA: Diagnosis present

## 2013-04-30 DIAGNOSIS — K8020 Calculus of gallbladder without cholecystitis without obstruction: Secondary | ICD-10-CM | POA: Diagnosis not present

## 2013-04-30 DIAGNOSIS — N179 Acute kidney failure, unspecified: Secondary | ICD-10-CM | POA: Diagnosis not present

## 2013-04-30 DIAGNOSIS — K810 Acute cholecystitis: Secondary | ICD-10-CM | POA: Diagnosis not present

## 2013-04-30 DIAGNOSIS — E1151 Type 2 diabetes mellitus with diabetic peripheral angiopathy without gangrene: Secondary | ICD-10-CM

## 2013-04-30 DIAGNOSIS — I2510 Atherosclerotic heart disease of native coronary artery without angina pectoris: Secondary | ICD-10-CM | POA: Diagnosis present

## 2013-04-30 DIAGNOSIS — R109 Unspecified abdominal pain: Secondary | ICD-10-CM

## 2013-04-30 DIAGNOSIS — IMO0001 Reserved for inherently not codable concepts without codable children: Secondary | ICD-10-CM | POA: Diagnosis present

## 2013-04-30 DIAGNOSIS — Z833 Family history of diabetes mellitus: Secondary | ICD-10-CM | POA: Diagnosis not present

## 2013-04-30 DIAGNOSIS — R1011 Right upper quadrant pain: Secondary | ICD-10-CM

## 2013-04-30 DIAGNOSIS — K819 Cholecystitis, unspecified: Secondary | ICD-10-CM | POA: Diagnosis not present

## 2013-04-30 DIAGNOSIS — E860 Dehydration: Secondary | ICD-10-CM | POA: Diagnosis present

## 2013-04-30 DIAGNOSIS — K567 Ileus, unspecified: Secondary | ICD-10-CM

## 2013-04-30 DIAGNOSIS — Z794 Long term (current) use of insulin: Secondary | ICD-10-CM

## 2013-04-30 DIAGNOSIS — F068 Other specified mental disorders due to known physiological condition: Secondary | ICD-10-CM | POA: Diagnosis present

## 2013-04-30 DIAGNOSIS — D518 Other vitamin B12 deficiency anemias: Secondary | ICD-10-CM

## 2013-04-30 DIAGNOSIS — F0390 Unspecified dementia without behavioral disturbance: Secondary | ICD-10-CM

## 2013-04-30 DIAGNOSIS — R7309 Other abnormal glucose: Secondary | ICD-10-CM

## 2013-04-30 DIAGNOSIS — I70219 Atherosclerosis of native arteries of extremities with intermittent claudication, unspecified extremity: Secondary | ICD-10-CM

## 2013-04-30 DIAGNOSIS — S0083XA Contusion of other part of head, initial encounter: Secondary | ICD-10-CM

## 2013-04-30 DIAGNOSIS — IMO0002 Reserved for concepts with insufficient information to code with codable children: Secondary | ICD-10-CM | POA: Diagnosis present

## 2013-04-30 DIAGNOSIS — E119 Type 2 diabetes mellitus without complications: Secondary | ICD-10-CM | POA: Diagnosis not present

## 2013-04-30 DIAGNOSIS — N181 Chronic kidney disease, stage 1: Secondary | ICD-10-CM

## 2013-04-30 DIAGNOSIS — E1165 Type 2 diabetes mellitus with hyperglycemia: Secondary | ICD-10-CM

## 2013-04-30 HISTORY — DX: Malignant (primary) neoplasm, unspecified: C80.1

## 2013-04-30 LAB — LIPASE, BLOOD: Lipase: 12 U/L (ref 11–59)

## 2013-04-30 LAB — CBC WITH DIFFERENTIAL/PLATELET
Basophils Absolute: 0 10*3/uL (ref 0.0–0.1)
Basophils Relative: 0 % (ref 0–1)
EOS PCT: 0 % (ref 0–5)
Eosinophils Absolute: 0 10*3/uL (ref 0.0–0.7)
HCT: 39.1 % (ref 39.0–52.0)
HEMOGLOBIN: 12.8 g/dL — AB (ref 13.0–17.0)
LYMPHS ABS: 1.1 10*3/uL (ref 0.7–4.0)
LYMPHS PCT: 7 % — AB (ref 12–46)
MCH: 29.4 pg (ref 26.0–34.0)
MCHC: 32.7 g/dL (ref 30.0–36.0)
MCV: 89.7 fL (ref 78.0–100.0)
Monocytes Absolute: 1.7 10*3/uL — ABNORMAL HIGH (ref 0.1–1.0)
Monocytes Relative: 11 % (ref 3–12)
NEUTROS ABS: 13.4 10*3/uL — AB (ref 1.7–7.7)
Neutrophils Relative %: 82 % — ABNORMAL HIGH (ref 43–77)
Platelets: 143 10*3/uL — ABNORMAL LOW (ref 150–400)
RBC: 4.36 MIL/uL (ref 4.22–5.81)
RDW: 14.2 % (ref 11.5–15.5)
WBC: 16.3 10*3/uL — ABNORMAL HIGH (ref 4.0–10.5)

## 2013-04-30 LAB — URINALYSIS, ROUTINE W REFLEX MICROSCOPIC
BILIRUBIN URINE: NEGATIVE
Glucose, UA: 250 mg/dL — AB
Ketones, ur: NEGATIVE mg/dL
Leukocytes, UA: NEGATIVE
Nitrite: NEGATIVE
Protein, ur: 30 mg/dL — AB
SPECIFIC GRAVITY, URINE: 1.018 (ref 1.005–1.030)
UROBILINOGEN UA: 1 mg/dL (ref 0.0–1.0)
pH: 5.5 (ref 5.0–8.0)

## 2013-04-30 LAB — COMPREHENSIVE METABOLIC PANEL
ALT: 11 U/L (ref 0–53)
AST: 16 U/L (ref 0–37)
Albumin: 3.1 g/dL — ABNORMAL LOW (ref 3.5–5.2)
Alkaline Phosphatase: 108 U/L (ref 39–117)
BUN: 50 mg/dL — ABNORMAL HIGH (ref 6–23)
CALCIUM: 9.9 mg/dL (ref 8.4–10.5)
CHLORIDE: 101 meq/L (ref 96–112)
CO2: 21 mEq/L (ref 19–32)
CREATININE: 2.52 mg/dL — AB (ref 0.50–1.35)
GFR calc non Af Amer: 23 mL/min — ABNORMAL LOW (ref >90)
GFR, EST AFRICAN AMERICAN: 26 mL/min — AB (ref >90)
GLUCOSE: 364 mg/dL — AB (ref 70–99)
Potassium: 4.5 mEq/L (ref 3.7–5.3)
Sodium: 140 mEq/L (ref 137–147)
Total Bilirubin: 0.8 mg/dL (ref 0.3–1.2)
Total Protein: 7.9 g/dL (ref 6.0–8.3)

## 2013-04-30 LAB — URINE MICROSCOPIC-ADD ON

## 2013-04-30 LAB — POCT URINALYSIS DIPSTICK
BILIRUBIN UA: NEGATIVE
GLUCOSE UA: NEGATIVE
Ketones, UA: NEGATIVE
Leukocytes, UA: NEGATIVE
NITRITE UA: POSITIVE
Spec Grav, UA: 1.025
Urobilinogen, UA: 1
pH, UA: 5

## 2013-04-30 LAB — GLUCOSE, CAPILLARY: Glucose-Capillary: 263 mg/dL — ABNORMAL HIGH (ref 70–99)

## 2013-04-30 LAB — I-STAT CG4 LACTIC ACID, ED: LACTIC ACID, VENOUS: 2.2 mmol/L (ref 0.5–2.2)

## 2013-04-30 LAB — CBG MONITORING, ED: Glucose-Capillary: 286 mg/dL — ABNORMAL HIGH (ref 70–99)

## 2013-04-30 MED ORDER — IOHEXOL 300 MG/ML  SOLN
50.0000 mL | Freq: Once | INTRAMUSCULAR | Status: AC | PRN
  Administered 2013-04-30: 50 mL via ORAL

## 2013-04-30 MED ORDER — INSULIN ASPART 100 UNIT/ML ~~LOC~~ SOLN
0.0000 [IU] | Freq: Three times a day (TID) | SUBCUTANEOUS | Status: DC
  Administered 2013-05-01: 4 [IU] via SUBCUTANEOUS
  Administered 2013-05-01: 8 [IU] via SUBCUTANEOUS
  Administered 2013-05-02: 3 [IU] via SUBCUTANEOUS
  Administered 2013-05-02: 5 [IU] via SUBCUTANEOUS
  Administered 2013-05-02: 3 [IU] via SUBCUTANEOUS

## 2013-04-30 MED ORDER — METOPROLOL TARTRATE 50 MG PO TABS
50.0000 mg | ORAL_TABLET | Freq: Two times a day (BID) | ORAL | Status: DC
  Administered 2013-04-30 – 2013-05-04 (×8): 50 mg via ORAL
  Filled 2013-04-30 (×9): qty 1

## 2013-04-30 MED ORDER — ACETAMINOPHEN 650 MG RE SUPP: 650.0000 mg | Freq: Four times a day (QID) | RECTAL | Status: DC | PRN

## 2013-04-30 MED ORDER — MORPHINE SULFATE 4 MG/ML IJ SOLN
4.0000 mg | Freq: Once | INTRAMUSCULAR | Status: AC
  Administered 2013-04-30: 4 mg via INTRAVENOUS
  Filled 2013-04-30: qty 1

## 2013-04-30 MED ORDER — ONDANSETRON HCL 4 MG/2ML IJ SOLN: 4.0000 mg | Freq: Four times a day (QID) | INTRAMUSCULAR | Status: DC | PRN

## 2013-04-30 MED ORDER — ONDANSETRON HCL 4 MG/2ML IJ SOLN
4.0000 mg | Freq: Once | INTRAMUSCULAR | Status: AC
  Administered 2013-04-30: 4 mg via INTRAVENOUS
  Filled 2013-04-30: qty 2

## 2013-04-30 MED ORDER — AMPICILLIN-SULBACTAM SODIUM 1.5 (1-0.5) G IJ SOLR
1.5000 g | Freq: Two times a day (BID) | INTRAMUSCULAR | Status: DC
  Administered 2013-04-30 – 2013-05-01 (×2): 1.5 g via INTRAVENOUS
  Filled 2013-04-30 (×2): qty 1.5

## 2013-04-30 MED ORDER — MORPHINE SULFATE 2 MG/ML IJ SOLN
2.0000 mg | INTRAMUSCULAR | Status: DC | PRN
  Administered 2013-05-01: 2 mg via INTRAVENOUS
  Filled 2013-04-30: qty 1

## 2013-04-30 MED ORDER — ONDANSETRON HCL 4 MG PO TABS: 4.0000 mg | ORAL_TABLET | Freq: Four times a day (QID) | ORAL | Status: DC | PRN

## 2013-04-30 MED ORDER — INSULIN ASPART 100 UNIT/ML ~~LOC~~ SOLN: 4.0000 [IU] | Freq: Three times a day (TID) | SUBCUTANEOUS | Status: DC

## 2013-04-30 MED ORDER — SODIUM CHLORIDE 0.9 % IV SOLN: INTRAVENOUS | Status: AC

## 2013-04-30 MED ORDER — ACETAMINOPHEN 325 MG PO TABS: 650.0000 mg | ORAL_TABLET | Freq: Four times a day (QID) | ORAL | Status: DC | PRN

## 2013-04-30 MED ORDER — PIPERACILLIN-TAZOBACTAM 3.375 G IVPB
3.3750 g | Freq: Once | INTRAVENOUS | Status: AC
  Administered 2013-04-30: 3.375 g via INTRAVENOUS
  Filled 2013-04-30 (×2): qty 50

## 2013-04-30 MED ORDER — ENOXAPARIN SODIUM 30 MG/0.3ML ~~LOC~~ SOLN
30.0000 mg | SUBCUTANEOUS | Status: DC
  Administered 2013-04-30 – 2013-05-02 (×3): 30 mg via SUBCUTANEOUS
  Filled 2013-04-30 (×4): qty 0.3

## 2013-04-30 MED ORDER — ALUM & MAG HYDROXIDE-SIMETH 200-200-20 MG/5ML PO SUSP: 30.0000 mL | Freq: Four times a day (QID) | ORAL | Status: DC | PRN

## 2013-04-30 MED ORDER — SODIUM CHLORIDE 0.9 % IV BOLUS (SEPSIS)
1000.0000 mL | Freq: Once | INTRAVENOUS | Status: AC
  Administered 2013-04-30: 1000 mL via INTRAVENOUS

## 2013-04-30 MED ORDER — HYDROCODONE-ACETAMINOPHEN 5-325 MG PO TABS
1.0000 | ORAL_TABLET | ORAL | Status: DC | PRN
  Administered 2013-05-01 – 2013-05-02 (×2): 1 via ORAL
  Filled 2013-04-30 (×2): qty 1

## 2013-04-30 MED ORDER — LEVETIRACETAM 500 MG PO TABS
500.0000 mg | ORAL_TABLET | Freq: Two times a day (BID) | ORAL | Status: DC
  Administered 2013-04-30 – 2013-05-04 (×8): 500 mg via ORAL
  Filled 2013-04-30 (×9): qty 1

## 2013-04-30 MED ORDER — ALLOPURINOL 300 MG PO TABS
300.0000 mg | ORAL_TABLET | Freq: Every day | ORAL | Status: DC
  Administered 2013-05-01 – 2013-05-04 (×4): 300 mg via ORAL
  Filled 2013-04-30 (×5): qty 1

## 2013-04-30 MED ORDER — INSULIN GLARGINE 100 UNIT/ML ~~LOC~~ SOLN
10.0000 [IU] | Freq: Every day | SUBCUTANEOUS | Status: DC
  Administered 2013-05-01: 10 [IU] via SUBCUTANEOUS
  Filled 2013-04-30 (×2): qty 0.1

## 2013-04-30 NOTE — ED Notes (Signed)
Provided Pt with Urinal and told him to let me know when he voided a Sample

## 2013-04-30 NOTE — Progress Notes (Signed)
   Subjective:    Patient ID: Robert Clayton, male    DOB: 1930/12/29, 80 y.o.   MRN: [id number]  HPI  He is here today for RUQ abdominal pain described as dull and lasting for a few seconds since Monday. The pain does not radiate. Since that time, he has noticed blood with urination.  He was here on 04/22/13 for a B-12 shot and today he reports weight loss of 18 pounds since that visit.  His last BM was Monday, which he described as a normal stool.    His PMH is complicated by 123456, last A1C of 9.7 on 03/17/13 and CKD Stage 1.   His UA today reveals +nitrates, moderate Hgb, 100+ protein, SG 1.025. Urine culture pending.    Review of Systems Denies fever, chills, sweats, nausea, vomiting.   Denies significant  dyspepsia, dysphagia, melena, rectal bleeding, or small caliber stools.  Change in stools such as diarrhea or constipation denied.  Dysuria, pyuria,  flank pain are absent.  No associated back pain with radiation anteriorly.  No associated change in color or temperature of the skin in area of symptoms.  No associated rash or skin lesions present.  Abnormal bruising or bleeding not present.   No smoking or alcohol consumption history.     Objective:   Physical Exam General appearance is frail, w/o distress.  Eyes: No conjunctival inflammation or scleral icterus is present. Arcus senilis.   Oral exam: No dentition; lips and gums are healthy appearing.There is no oropharyngeal erythema or exudate noted.   Heart:  Elevated pulse, regular rhythm. Grade 123456 S1 systolic murmur with slurred S2; no rub or other extra sounds.   Lungs:Chest clear to auscultation; no wheezes, rhonchi,rales ,or rubs present. Audible work of breathing.   Abdomen: bowel sounds decreased, tenderness at RUQ with light palpation; Pain at RUQ positional from sitting to supine. No tenderness over the flanks to percussion.   Low aortic bruit; no carotid or renal bruits.   Musculoskeletal: Able to  lie flat and sit up with help. Classic low back crawl.  Parkinsonian shuffle gait.  Skin:Warm & dry.  Intact without suspicious lesions or rashes ; no jaundice or tenting  Lymphatic: No lymphadenopathy is noted about the head, neck, axilla areas.      Assessment & Plan:

## 2013-04-30 NOTE — Patient Instructions (Signed)
To ER now;the Triage Nurse will be contacted

## 2013-04-30 NOTE — Consult Note (Signed)
Agree with above.  Acute cholecystitis.    Lap chole

## 2013-04-30 NOTE — Progress Notes (Signed)
  CARE MANAGEMENT ED NOTE 04/30/2013  Patient:  AMRO, DISCALA   Account Number:  [id number]  Date Initiated:  04/30/2013  Documentation initiated by:  Livia Snellen  Subjective/Objective Assessment:   Patient presents to ED with right upper quadrant abdominal pain.     Subjective/Objective Assessment Detail:     Action/Plan:   Action/Plan Detail:   Anticipated DC Date:       Status Recommendation to Physician:   Result of Recommendation:    Other ED Services  Consult [facility] [place]  Other    Choice offered to / List presented to:            Status of service:  Completed, signed off  ED Comments:   ED Comments Detail:  EDCM spoke to patient and his wife at bedside.  Patient lives at home with his wife.  Patient does not have home health services at this time.  Pamtient has a walker, cane and a bedside commode at home.  Patient reports he is usually able to perform his own ADL's without difficulty. EDCM provided patient's wife printed list of home health agencies in [employer], and private duty nursing lists.  EDCM explaied with home health the patient may receive a visiting RN, PT, OT aide and social worker if needed.  EDCM also explained that private duty nursing services may be an out of pocket expense for the patient. Patient and paient's wife thankful for resources.  No further EDCM needs at this time.

## 2013-04-30 NOTE — ED Notes (Signed)
Pt aware of the need for urine and has urinal at bedside, unable to provide at this time
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
## 2013-04-30 NOTE — ED Notes (Signed)
Surg paged for Dr Maryan Rued...Q.A

## 2013-04-30 NOTE — ED Notes (Signed)
Patient transported to X-ray 

## 2013-04-30 NOTE — ED Notes (Signed)
Pt reports pain only upon movement. Pt is A&O and in NAD. VSS, wife at bedside

## 2013-04-30 NOTE — Progress Notes (Signed)
Pre visit review using our clinic review tool, if applicable. No additional management support is needed unless otherwise documented below in the visit note. 

## 2013-04-30 NOTE — Progress Notes (Signed)
Report called to[facility] Triage RN, Faith. Patient transported by private vehicle; spouse driving.
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
## 2013-04-30 NOTE — Progress Notes (Signed)
   Subjective:    Patient ID: Robert Clayton, male    DOB: [birth year], 80 y.o.   MRN: [id number]  HPI He is here today for RUQ abdominal pain described as dull and lasting for a few seconds since Monday. The pain does not radiate.  Since that time, he has noticed blood with urination.  He was here on 04/22/13 for a B-12 shot and today he reports weight loss of 18 pounds since that visit. Recent visit vitals were reviewed. His weight was 211 pounds at 12/09/12 and 03/17/12 visits. Today's weight is 204 pounds. His last TSH was 1.7 on 06/08/12.  His last BM was Monday, which he described as a normal stool.  His PMH is complicated by 123456, last A1C of 9.7 on 03/17/13 and CKD Stage 1.  His UA today reveals +nitrates, moderate Hgb, 100+ protein, SG 1.025. Urine culture pending.    Review of Systems Denies fever, chills, sweats, nausea, vomiting.  Denies significant dyspepsia, dysphagia, melena, rectal bleeding, or small caliber stools.  Change in stools such as diarrhea or constipation denied.  Dysuria, pyuria, flank pain are absent.  No associated back pain with radiation anteriorly.  No associated change in color or temperature of the skin in area of symptoms.  No associated rash or skin lesions present.  Abnormal bruising or bleeding not present.  Denies smoking history or ETOH use.       Objective:   Physical Exam General appearance is frail, w/o distress.  Eyes: No conjunctival inflammation or scleral icterus is present. Arcus senilis.  Oral exam: No dentition; lips and gums are healthy appearing.There is no oropharyngeal erythema or exudate noted.  Heart: Tachycardia. Grade 123456 S1 systolic murmur with slurred S2; no rub or other extra sounds.  Lungs:Chest clear to auscultation; no wheezes, rhonchi,rales ,or rubs present. Audible work of breathing.  Abdomen: bowel sounds DECREASED with intermittent tinkling sounds; exquisitely tender at RUQ with even light palpation; No masses,  organomegaly or hernias noted.  Pain at RUQ positional from sitting to supine. No tenderness over the flanks to percussion.  Low aortic bruit; no carotid or renal bruits.  Musculoskeletal: Able to lie flat and sit up with help.  Parkinsonian shuffle gait.  Skin:Warm & dry. Intact without suspicious lesions or rashes ; no jaundice or tenting  Lymphatic: No lymphadenopathy is noted about the head, neck, axilla areas.        Assessment & Plan:  #1 RUQ pain  With clinical suggestion of ileus #2 tachycardia #3 anorexia with weight loss  #4  hematuria with +nitrates  Plan : to ER for  possible acute abdomen with ileus. With the urine findings this may represent renal calculi but clinically it appears to be related to gallbladder.
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
## 2013-04-30 NOTE — Progress Notes (Signed)
Patient married.  Granddaughter, Delton Coombes, in room.  Dr. Alona Bene is his PCP.  He is feeling better now.   HbgA1c - 9.7 - 03/17/2013  Creatinine - 2.52 - 04/30/2013  Gall bladder disease diagnosed by CT scan - 04/30/2013   I discussed with the patient the indications and risks of gall bladder surgery.  The primary risks of gall bladder surgery include, but are not limited to, bleeding, infection, common bile duct injury, and open surgery.  There is also the risk that the patient may have continued symptoms after surgery.  However, the likelihood of improvement in symptoms and return to the patient's normal status is good. We discussed the typical post-operative recovery course. I tried to answer the patient's questions.  I gave the patient literature about gall bladder surgery.  Alphonsa Overall, MD, [facility] Surgery Pager: [phone] Office phone:  [phone]

## 2013-04-30 NOTE — ED Notes (Signed)
Pt reports abd pain x4 days, RUQ. Pt denies N/V/D. Pt pain 10/10. Pt is A&O and in NAD. Pt wife at bedside

## 2013-04-30 NOTE — ED Notes (Addendum)
[facility] called about pt. Pt c/o RUQ pain since Monday, pain 10/10. Last bowel movement on Monday. Hx of DM and stage 1 kidney disease. Denies n/v/d.
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
## 2013-05-01 ENCOUNTER — Inpatient Hospital Stay (HOSPITAL_COMMUNITY): Payer: Medicare Other

## 2013-05-01 ENCOUNTER — Inpatient Hospital Stay (HOSPITAL_COMMUNITY): Payer: Medicare Other | Admitting: Anesthesiology

## 2013-05-01 ENCOUNTER — Encounter (HOSPITAL_COMMUNITY): Payer: Self-pay | Admitting: Anesthesiology

## 2013-05-01 ENCOUNTER — Encounter (HOSPITAL_COMMUNITY)
Admission: EM | Disposition: A | Discharge status: Home / Self Care | Payer: Self-pay | Source: Home / Self Care | Attending: Internal Medicine

## 2013-05-01 ENCOUNTER — Encounter (HOSPITAL_COMMUNITY): Payer: Medicare Other | Admitting: Anesthesiology

## 2013-05-01 DIAGNOSIS — K8000 Calculus of gallbladder with acute cholecystitis without obstruction: Secondary | ICD-10-CM | POA: Diagnosis not present

## 2013-05-01 DIAGNOSIS — K810 Acute cholecystitis: Secondary | ICD-10-CM | POA: Diagnosis not present

## 2013-05-01 DIAGNOSIS — N189 Chronic kidney disease, unspecified: Secondary | ICD-10-CM | POA: Diagnosis not present

## 2013-05-01 DIAGNOSIS — E8881 Metabolic syndrome: Secondary | ICD-10-CM | POA: Diagnosis not present

## 2013-05-01 DIAGNOSIS — K8020 Calculus of gallbladder without cholecystitis without obstruction: Secondary | ICD-10-CM | POA: Diagnosis not present

## 2013-05-01 DIAGNOSIS — K819 Cholecystitis, unspecified: Secondary | ICD-10-CM | POA: Diagnosis not present

## 2013-05-01 DIAGNOSIS — N179 Acute kidney failure, unspecified: Secondary | ICD-10-CM | POA: Diagnosis not present

## 2013-05-01 DIAGNOSIS — A419 Sepsis, unspecified organism: Secondary | ICD-10-CM | POA: Diagnosis not present

## 2013-05-01 HISTORY — PX: CHOLECYSTECTOMY: SHX55

## 2013-05-01 LAB — URINE CULTURE: Colony Count: 15000

## 2013-05-01 LAB — COMPREHENSIVE METABOLIC PANEL
ALT: 76 U/L — ABNORMAL HIGH (ref 0–53)
AST: 119 U/L — ABNORMAL HIGH (ref 0–37)
Albumin: 2.4 g/dL — ABNORMAL LOW (ref 3.5–5.2)
Alkaline Phosphatase: 164 U/L — ABNORMAL HIGH (ref 39–117)
BUN: 39 mg/dL — AB (ref 6–23)
CALCIUM: 9.3 mg/dL (ref 8.4–10.5)
CO2: 20 meq/L (ref 19–32)
Chloride: 107 mEq/L (ref 96–112)
Creatinine, Ser: 2.07 mg/dL — ABNORMAL HIGH (ref 0.50–1.35)
GFR calc non Af Amer: 29 mL/min — ABNORMAL LOW (ref >90)
GFR, EST AFRICAN AMERICAN: 33 mL/min — AB (ref >90)
GLUCOSE: 288 mg/dL — AB (ref 70–99)
Potassium: 5.6 mEq/L — ABNORMAL HIGH (ref 3.7–5.3)
Sodium: 140 mEq/L (ref 137–147)
Total Bilirubin: 1 mg/dL (ref 0.3–1.2)
Total Protein: 6.9 g/dL (ref 6.0–8.3)

## 2013-05-01 LAB — CBC
HCT: 39.2 % (ref 39.0–52.0)
HEMOGLOBIN: 12.9 g/dL — AB (ref 13.0–17.0)
MCH: 29.8 pg (ref 26.0–34.0)
MCHC: 32.9 g/dL (ref 30.0–36.0)
MCV: 90.5 fL (ref 78.0–100.0)
Platelets: 135 10*3/uL — ABNORMAL LOW (ref 150–400)
RBC: 4.33 MIL/uL (ref 4.22–5.81)
RDW: 14.4 % (ref 11.5–15.5)
WBC: 11.5 10*3/uL — AB (ref 4.0–10.5)

## 2013-05-01 LAB — GLUCOSE, CAPILLARY
GLUCOSE-CAPILLARY: 252 mg/dL — AB (ref 70–99)
Glucose-Capillary: 256 mg/dL — ABNORMAL HIGH (ref 70–99)
Glucose-Capillary: 258 mg/dL — ABNORMAL HIGH (ref 70–99)
Glucose-Capillary: 269 mg/dL — ABNORMAL HIGH (ref 70–99)
Glucose-Capillary: 187 mg/dL — ABNORMAL HIGH (ref 70–99)

## 2013-05-01 LAB — SURGICAL PCR SCREEN
MRSA, PCR: NEGATIVE
STAPHYLOCOCCUS AUREUS: NEGATIVE

## 2013-05-01 LAB — POCT I-STAT 4, (NA,K, GLUC, HGB,HCT)
Glucose, Bld: 286 mg/dL — ABNORMAL HIGH (ref 70–99)
HCT: 40 % (ref 39.0–52.0)
Hemoglobin: 13.6 g/dL (ref 13.0–17.0)
Potassium: 4.6 mEq/L (ref 3.7–5.3)
SODIUM: 144 meq/L (ref 137–147)

## 2013-05-01 LAB — HEMOGLOBIN A1C
Hgb A1c MFr Bld: 9.2 % — ABNORMAL HIGH (ref <5.7)
MEAN PLASMA GLUCOSE: 217 mg/dL — AB (ref <117)

## 2013-05-01 SURGERY — LAPAROSCOPIC CHOLECYSTECTOMY WITH INTRAOPERATIVE CHOLANGIOGRAM
Anesthesia: General | Site: Abdomen

## 2013-05-01 MED ORDER — ONDANSETRON HCL 4 MG/2ML IJ SOLN
INTRAMUSCULAR | Status: DC | PRN
  Administered 2013-05-01: 4 mg via INTRAVENOUS

## 2013-05-01 MED ORDER — GLYCOPYRROLATE 0.2 MG/ML IJ SOLN
INTRAMUSCULAR | Status: DC | PRN
  Administered 2013-05-01: .4 mg via INTRAVENOUS

## 2013-05-01 MED ORDER — AMPICILLIN-SULBACTAM SODIUM 3 (2-1) G IJ SOLR
3.0000 g | Freq: Four times a day (QID) | INTRAMUSCULAR | Status: DC
  Administered 2013-05-01 – 2013-05-03 (×8): 3 g via INTRAVENOUS
  Filled 2013-05-01 (×11): qty 3

## 2013-05-01 MED ORDER — SODIUM CHLORIDE 0.9 % IV SOLN
INTRAVENOUS | Status: DC
  Administered 2013-05-01 – 2013-05-02 (×2): 100 mL/h via INTRAVENOUS
  Administered 2013-05-02 – 2013-05-03 (×2): via INTRAVENOUS

## 2013-05-01 MED ORDER — 0.9 % SODIUM CHLORIDE (POUR BTL) OPTIME
TOPICAL | Status: DC | PRN
  Administered 2013-05-01: 1000 mL

## 2013-05-01 MED ORDER — ONDANSETRON HCL 4 MG/2ML IJ SOLN
INTRAMUSCULAR | Status: AC
  Filled 2013-05-01: qty 2

## 2013-05-01 MED ORDER — FENTANYL CITRATE 0.05 MG/ML IJ SOLN
INTRAMUSCULAR | Status: DC | PRN
  Administered 2013-05-01 (×3): 50 ug via INTRAVENOUS
  Administered 2013-05-01: 100 ug via INTRAVENOUS

## 2013-05-01 MED ORDER — BUPIVACAINE HCL 0.25 % IJ SOLN
INTRAMUSCULAR | Status: DC | PRN
  Administered 2013-05-01: 30 mL

## 2013-05-01 MED ORDER — CISATRACURIUM BESYLATE 20 MG/10ML IV SOLN
INTRAVENOUS | Status: AC
  Filled 2013-05-01: qty 10

## 2013-05-01 MED ORDER — BUPIVACAINE HCL (PF) 0.25 % IJ SOLN
INTRAMUSCULAR | Status: AC
  Filled 2013-05-01: qty 30

## 2013-05-01 MED ORDER — CISATRACURIUM BESYLATE (PF) 10 MG/5ML IV SOLN
INTRAVENOUS | Status: DC | PRN
  Administered 2013-05-01: 5 mg via INTRAVENOUS

## 2013-05-01 MED ORDER — SUCCINYLCHOLINE CHLORIDE 20 MG/ML IJ SOLN
INTRAMUSCULAR | Status: DC | PRN
  Administered 2013-05-01: 100 mg via INTRAVENOUS

## 2013-05-01 MED ORDER — PROPOFOL 10 MG/ML IV BOLUS
INTRAVENOUS | Status: AC
  Filled 2013-05-01: qty 20

## 2013-05-01 MED ORDER — LIDOCAINE HCL (CARDIAC) 20 MG/ML IV SOLN
INTRAVENOUS | Status: DC | PRN
  Administered 2013-05-01: 100 mg via INTRAVENOUS

## 2013-05-01 MED ORDER — FENTANYL CITRATE 0.05 MG/ML IJ SOLN
25.0000 ug | INTRAMUSCULAR | Status: DC | PRN
  Administered 2013-05-01 (×2): 50 ug via INTRAVENOUS

## 2013-05-01 MED ORDER — INSULIN ASPART PROT & ASPART (70-30 MIX) 100 UNIT/ML ~~LOC~~ SUSP
25.0000 [IU] | Freq: Two times a day (BID) | SUBCUTANEOUS | Status: DC
  Administered 2013-05-01 – 2013-05-02 (×2): 25 [IU] via SUBCUTANEOUS
  Filled 2013-05-01: qty 10

## 2013-05-01 MED ORDER — SODIUM CHLORIDE 0.9 % IV SOLN
INTRAVENOUS | Status: DC | PRN
  Administered 2013-05-01 (×2): via INTRAVENOUS

## 2013-05-01 MED ORDER — GLYCOPYRROLATE 0.2 MG/ML IJ SOLN
INTRAMUSCULAR | Status: AC
  Filled 2013-05-01: qty 2

## 2013-05-01 MED ORDER — NEOSTIGMINE METHYLSULFATE 1 MG/ML IJ SOLN
INTRAMUSCULAR | Status: DC | PRN
  Administered 2013-05-01: 3 mg via INTRAVENOUS

## 2013-05-01 MED ORDER — IOHEXOL 300 MG/ML  SOLN
INTRAMUSCULAR | Status: DC | PRN
  Administered 2013-05-01: 2 mL

## 2013-05-01 MED ORDER — PHENYLEPHRINE 40 MCG/ML (10ML) SYRINGE FOR IV PUSH (FOR BLOOD PRESSURE SUPPORT)
PREFILLED_SYRINGE | INTRAVENOUS | Status: AC
  Filled 2013-05-01: qty 10

## 2013-05-01 MED ORDER — FENTANYL CITRATE 0.05 MG/ML IJ SOLN
INTRAMUSCULAR | Status: AC
  Filled 2013-05-01: qty 2

## 2013-05-01 MED ORDER — INSULIN ASPART 100 UNIT/ML ~~LOC~~ SOLN
SUBCUTANEOUS | Status: AC
  Filled 2013-05-01: qty 1

## 2013-05-01 MED ORDER — PHENYLEPHRINE HCL 10 MG/ML IJ SOLN
INTRAMUSCULAR | Status: DC | PRN
  Administered 2013-05-01: 120 ug via INTRAVENOUS
  Administered 2013-05-01: 80 ug via INTRAVENOUS

## 2013-05-01 MED ORDER — LIDOCAINE HCL (CARDIAC) 20 MG/ML IV SOLN
INTRAVENOUS | Status: AC
  Filled 2013-05-01: qty 5

## 2013-05-01 MED ORDER — PROPOFOL 10 MG/ML IV BOLUS
INTRAVENOUS | Status: DC | PRN
  Administered 2013-05-01: 200 mg via INTRAVENOUS

## 2013-05-01 MED ORDER — SODIUM CHLORIDE 0.9 % IV SOLN
INTRAVENOUS | Status: DC
  Administered 2013-05-01: 07:00:00 via INTRAVENOUS

## 2013-05-01 MED ORDER — SODIUM CHLORIDE 0.9 % IV SOLN
20.0000 mg | INTRAVENOUS | Status: DC | PRN
  Administered 2013-05-01: 50 ug/min via INTRAVENOUS

## 2013-05-01 MED ORDER — LACTATED RINGERS IR SOLN
Status: DC | PRN
  Administered 2013-05-01: 3000 mL

## 2013-05-01 MED ORDER — INSULIN ASPART 100 UNIT/ML ~~LOC~~ SOLN
SUBCUTANEOUS | Status: DC | PRN
  Administered 2013-05-01: 8 [IU] via SUBCUTANEOUS

## 2013-05-01 MED ORDER — MEPERIDINE HCL 50 MG/ML IJ SOLN: 6.2500 mg | INTRAMUSCULAR | Status: DC | PRN

## 2013-05-01 MED ORDER — PROMETHAZINE HCL 25 MG/ML IJ SOLN: 6.2500 mg | INTRAMUSCULAR | Status: DC | PRN

## 2013-05-01 MED ORDER — AMPICILLIN-SULBACTAM SODIUM 1.5 (1-0.5) G IJ SOLR
1.5000 g | Freq: Two times a day (BID) | INTRAMUSCULAR | Status: DC
Start: 2013-05-01 — End: 2013-05-01
  Filled 2013-05-01 (×3): qty 1.5

## 2013-05-01 MED ORDER — FENTANYL CITRATE 0.05 MG/ML IJ SOLN
INTRAMUSCULAR | Status: AC
  Filled 2013-05-01: qty 5

## 2013-05-01 SURGICAL SUPPLY — 43 items
APPLIER CLIP ROT 10 11.4 M/L (STAPLE) ×3
CANISTER SUCTION 2500CC (MISCELLANEOUS) ×3 IMPLANT
CATH REDDICK CHOLANGI 4FR 50CM (CATHETERS) IMPLANT
CHLORAPREP W/TINT 26ML (MISCELLANEOUS) ×3 IMPLANT
CHOLANGIOGRAM CATH TAUT (CATHETERS) ×3 IMPLANT
CLIP APPLIE ROT 10 11.4 M/L (STAPLE) ×1 IMPLANT
COVER MAYO STAND STRL (DRAPES) ×3 IMPLANT
DECANTER SPIKE VIAL GLASS SM (MISCELLANEOUS) ×3 IMPLANT
DERMABOND ADVANCED (GAUZE/BANDAGES/DRESSINGS) ×2
DERMABOND ADVANCED .7 DNX12 (GAUZE/BANDAGES/DRESSINGS) ×1 IMPLANT
DRAIN CHANNEL RND F F (WOUND CARE) ×3 IMPLANT
DRAPE C-ARM 42X120 X-RAY (DRAPES) ×3 IMPLANT
DRAPE LAPAROSCOPIC ABDOMINAL (DRAPES) ×3 IMPLANT
DRAPE UTILITY XL STRL (DRAPES) ×3 IMPLANT
ELECT REM PT RETURN 9FT ADLT (ELECTROSURGICAL) ×3
ELECTRODE REM PT RTRN 9FT ADLT (ELECTROSURGICAL) ×1 IMPLANT
ENDOLOOP SUT PDS II  0 18 (SUTURE) ×4
ENDOLOOP SUT PDS II 0 18 (SUTURE) ×2 IMPLANT
EVACUATOR SILICONE 100CC (DRAIN) ×3 IMPLANT
GAUZE SPONGE 4X4 16PLY XRAY LF (GAUZE/BANDAGES/DRESSINGS) ×3 IMPLANT
GLOVE BIOGEL PI IND STRL 7.5 (GLOVE) ×1 IMPLANT
GLOVE BIOGEL PI INDICATOR 7.5 (GLOVE) ×2
GLOVE SS BIOGEL STRL SZ 7.5 (GLOVE) ×1 IMPLANT
GLOVE SUPERSENSE BIOGEL SZ 7.5 (GLOVE) ×2
GOWN STRL REUS W/TWL XL LVL3 (GOWN DISPOSABLE) ×6 IMPLANT
HEMOSTAT SNOW SURGICEL 2X4 (HEMOSTASIS) IMPLANT
KIT BASIN OR (CUSTOM PROCEDURE TRAY) ×3 IMPLANT
NS IRRIG 1000ML POUR BTL (IV SOLUTION) IMPLANT
POUCH SPECIMEN RETRIEVAL 10MM (ENDOMECHANICALS) IMPLANT
SCISSORS LAP 5X35 DISP (ENDOMECHANICALS) ×3 IMPLANT
SET CHOLANGIOGRAPH MIX (MISCELLANEOUS) ×3 IMPLANT
SET IRRIG TUBING LAPAROSCOPIC (IRRIGATION / IRRIGATOR) ×3 IMPLANT
SLEEVE XCEL OPT CAN 5 100 (ENDOMECHANICALS) ×3 IMPLANT
SOLUTION ANTI FOG 6CC (MISCELLANEOUS) ×3 IMPLANT
SUT ETHILON 2 0 PS N (SUTURE) ×3 IMPLANT
SUT MNCRL AB 4-0 PS2 18 (SUTURE) ×3 IMPLANT
TOWEL OR 17X26 10 PK STRL BLUE (TOWEL DISPOSABLE) ×3 IMPLANT
TOWEL OR NON WOVEN STRL DISP B (DISPOSABLE) ×3 IMPLANT
TRAY LAP CHOLE (CUSTOM PROCEDURE TRAY) ×3 IMPLANT
TROCAR BLADELESS OPT 5 100 (ENDOMECHANICALS) ×3 IMPLANT
TROCAR XCEL BLUNT TIP 100MML (ENDOMECHANICALS) ×3 IMPLANT
TROCAR XCEL NON-BLD 11X100MML (ENDOMECHANICALS) ×3 IMPLANT
TUBING INSUFFLATION 10FT LAP (TUBING) ×3 IMPLANT

## 2013-05-01 NOTE — Progress Notes (Addendum)
[facility] Progress Note   Robert Clayton [id number] DOB: 1930-12-04 DOA: 04/30/2013 PCP: [name], MD  Brief narrative: Robert Clayton is a 80 y.o. male presenting on 04/30/2013 with  PMH listed below who developed right upper abdominal pain 4 days ago. He has not been able to eat but has been drinking a lot of fluids. He has been taking Tylenol and Alleve but these did not resolve his pain. He has not had fever/chills/ vomiting or diarrhea. He decided to come to the ER today and is found to have acute cholecystitis.   Subjective: In pain after his surgery. No other complaints  Assessment/Plan: Principal Problem:   Cholecystitis - s/p cholecystectomy now with drain  Active Problems: Renal failure (ARF), acute on chronic -prerenal - slowly improving cont to hydrate and follow    GOUT Cont allopurinol     DM (diabetes mellitus), type 2 On 75/25 at home but not available here - will order 70/30 at lower dose as he is not eating much yet  HTN - cont b blocker- current hypertensive due to pain    Code Status: Full code Family Communication: with wife Disposition Plan: home  Consultants: surgery  Antibiotics: Antibiotics Given (last 72 hours)   Date/Time Action Medication Dose Rate   04/30/13 2107 Given   [MAR Hold] ampicillin-sulbactam (UNASYN) 1.5 g in sodium chloride 0.9 % 50 mL IVPB (On MAR Hold since 05/01/13 0932) 1.5 g 100 mL/hr   05/01/13 0829 Given   [MAR Hold] ampicillin-sulbactam (UNASYN) 1.5 g in sodium chloride 0.9 % 50 mL IVPB (On MAR Hold since 05/01/13 0932) 1.5 g 100 mL/hr       DVT prophylaxis: lovenox  Objective: Filed Weights   04/30/13 1715 04/30/13 1814  Weight: 92.7 kg (204 lb 5.9 oz) 93.2 kg (205 lb 7.5 oz)   Blood pressure 170/92, pulse 76, temperature 98 F (36.7 C), temperature source Oral, resp. rate 18, height 5\' 11"  (1.803 m), weight 93.2 kg (205 lb 7.5 oz), SpO2 98.00%.  Intake/Output Summary (Last 24 hours) at  05/01/13 1545 Last data filed at 05/01/13 1224  Gross per 24 hour  Intake   2325 ml  Output    795 ml  Net   1530 ml     Exam: General: No acute respiratory distress Lungs: Clear to auscultation bilaterally without wheezes or crackles Cardiovascular: Regular rate and rhythm without murmur gallop or rub normal S1 and S2 Abdomen: + tender, nondistended, soft, bowel sounds positive, no rebound, no ascites, no appreciable mass Extremities: No significant cyanosis, clubbing, or edema bilateral lower extremities  Data Reviewed: Basic Metabolic Panel:  Recent Labs Lab 04/30/13 1130 05/01/13 0437 05/01/13 0926  NA 140 140 144  K 4.5 5.6* 4.6  CL 101 107  --   CO2 21 20  --   GLUCOSE 364* 288* 286*  BUN 50* 39*  --   CREATININE 2.52* 2.07*  --   CALCIUM 9.9 9.3  --    Liver Function Tests:  Recent Labs Lab 04/30/13 1130 05/01/13 0437  AST 16 119*  ALT 11 76*  ALKPHOS 108 164*  BILITOT 0.8 1.0  PROT 7.9 6.9  ALBUMIN 3.1* 2.4*    Recent Labs Lab 04/30/13 1130  LIPASE 12   No results found for this basename: AMMONIA,  in the last 168 hours CBC:  Recent Labs Lab 04/30/13 1130 05/01/13 0437 05/01/13 0926  WBC 16.3* 11.5*  --   NEUTROABS 13.4*  --   --   HGB  12.8* 12.9* 13.6  HCT 39.1 39.2 40.0  MCV 89.7 90.5  --   PLT 143* 135*  --    Cardiac Enzymes: No results found for this basename: CKTOTAL, CKMB, CKMBINDEX, TROPONINI,  in the last 168 hours BNP (last 3 results) No results found for this basename: PROBNP,  in the last 8760 hours CBG:  Recent Labs Lab 04/30/13 1636 04/30/13 1910 05/01/13 0807 05/01/13 1155 05/01/13 1256  GLUCAP 286* 263* 256* 252* 258*    Recent Results (from the past 240 hour(s))  URINE CULTURE     Status: None   Collection Time    04/30/13 11:44 AM      Result Value Ref Range Status   Colony Count 15,000 COLONIES/ML   Final   Organism ID, Bacteria Multiple bacterial morphotypes present, none   Final   Organism ID,  Bacteria predominant. Suggest appropriate recollection if    Final   Organism ID, Bacteria clinically indicated.   Final  SURGICAL PCR SCREEN     Status: None   Collection Time    05/01/13  2:59 AM      Result Value Ref Range Status   MRSA, PCR NEGATIVE  NEGATIVE Final   Staphylococcus aureus NEGATIVE  NEGATIVE Final   Comment:            The Xpert SA Assay (FDA     approved for NASAL specimens     in patients over 28 years of age),     is one component of     a comprehensive surveillance     program.  Test performance has     been validated by [employer] for patients greater     than or equal to 37 year old.     It is not intended     to diagnose infection nor to     guide or monitor treatment.     Studies:  Recent x-ray studies have been reviewed in detail by the Attending Physician  Scheduled Meds:  Scheduled Meds: . sodium chloride   Intravenous STAT  . allopurinol  300 mg Oral Daily  . ampicillin-sulbactam (UNASYN) IV  3 g Intravenous 4 times per day  . enoxaparin (LOVENOX) injection  30 mg Subcutaneous Q24H  . fentaNYL      . insulin aspart  0-15 Units Subcutaneous TID WC  . insulin glargine  10 Units Subcutaneous Daily  . levETIRAcetam  500 mg Oral BID  . metoprolol  50 mg Oral BID   Continuous Infusions: . sodium chloride      Time spent on care of this patient: 35 min   Debbe Odea, MD 05/01/2013, 3:45 PM  LOS: 1 day   [facility] Office  [phone] Pager - Text Page per Shea Evans   If 7PM-7AM, please contact night-coverage [email or web address]

## 2013-05-01 NOTE — Anesthesia Procedure Notes (Signed)
Procedure Name: Intubation Date/Time: 05/01/2013 9:55 AM Performed by: Danley Danker L Patient Re-evaluated:Patient Re-evaluated prior to inductionOxygen Delivery Method: Circle system utilized Preoxygenation: Pre-oxygenation with 100% oxygen Intubation Type: IV induction Ventilation: Mask ventilation without difficulty and Oral airway inserted - appropriate to patient size Laryngoscope Size: [place] and 3 Grade View: Grade I Tube type: Oral Tube size: 8.0 mm Number of attempts: 1 Airway Equipment and Method: Stylet Placement Confirmation: ETT inserted through vocal cords under direct vision,  breath sounds checked- equal and bilateral and positive ETCO2 Secured at: 22 cm Tube secured with: Tape Dental Injury: Teeth and Oropharynx as per pre-operative assessment

## 2013-05-01 NOTE — Anesthesia Preprocedure Evaluation (Addendum)
Anesthesia Evaluation  Patient identified by MRN, date of birth, ID band Patient awake    Reviewed: Allergy & Precautions, H&P , NPO status , Patient's Chart, lab work & pertinent test results, reviewed documented beta blocker date and time   Airway Mallampati: III TM Distance: >3 FB Neck ROM: Full    Dental  (+) Edentulous Lower, Edentulous Upper   Pulmonary neg pulmonary ROS,  breath sounds clear to auscultation  Pulmonary exam normal       Cardiovascular hypertension, Pt. on medications and Pt. on home beta blockers + CAD and + Peripheral Vascular Disease Rhythm:Regular Rate:Normal     Neuro/Psych PSYCHIATRIC DISORDERS Hx/o Subdural Hematoma S/P evacuation Hx/o Closed head injury dementia    GI/Hepatic negative GI ROS, Cholelithiasis with Cholecystitis   Endo/Other  diabetes, Poorly Controlled, Type 2, Insulin Dependent, Oral Hypoglycemic AgentsHyperlipidemia  Renal/GU Renal InsufficiencyRenal disease   Hx/o Prostate Ca S/P Radium seed implant    Musculoskeletal  (+) Arthritis -,   Abdominal   Peds  Hematology  (+) anemia , B12 deficiency   Anesthesia Other Findings   Reproductive/Obstetrics negative OB ROS                          Anesthesia Physical Anesthesia Plan  ASA: III  Anesthesia Plan: General   Post-op Pain Management:    Induction: Intravenous and Cricoid pressure planned  Airway Management Planned: Oral ETT  Additional Equipment:   Intra-op Plan:   Post-operative Plan: Extubation in OR  Informed Consent: I have reviewed the patients History and Physical, chart, labs and discussed the procedure including the risks, benefits and alternatives for the proposed anesthesia with the patient or authorized representative who has indicated his/her understanding and acceptance.   Dental advisory given  Plan Discussed with: Anesthesiologist, CRNA and Surgeon  Anesthesia  Plan Comments:         Anesthesia Quick Evaluation

## 2013-05-01 NOTE — Op Note (Signed)
05/01/2013  11:44 AM  PATIENT:  Robert Clayton, 80 y.o., male, MRN: [id number]  PREOP DIAGNOSIS:  CHOLELITHIASIS/CHOLECYSTITIS  POSTOP DIAGNOSIS:   Gangrenous cholecystitis with cholelithiasis  PROCEDURE:   Procedure(s):  LAPAROSCOPIC CHOLECYSTECTOMY  (No cholangiogram)  SURGEON:   Alphonsa Overall, M.D.  ASSISTANT:   B. Hoxworth, M.D.  ANESTHESIA:   general  Anesthesiologist: Venia Carbon. Royce Macadamia, MD CRNA: Sharlette Dense, CRNA  General  ASA: 3E  EBL:  100  ml  BLOOD ADMINISTERED: none  DRAINS: none   LOCAL MEDICATIONS USED:   30 cc 1/4% Marcaine  SPECIMEN:   Gall bladder  COUNTS CORRECT:  YES  INDICATIONS FOR PROCEDURE:  Robert Clayton is a 80 y.o. (DOB: 08-25-1930) AA  male whose primary care physician is [name], MD and comes for cholecystectomy.  The patient was admitted with acute cholecystitis at [facility].   The indications and risks of the gall bladder surgery were explained to the patient.  The risks include, but are not limited to, infection, bleeding, common bile duct injury and open surgery.  SURGERY:  The patient was taken to room #11 at [facility].  The abdomen was prepped with chloroprep.  The patient was already on Unasyn as an antibiotic.   A time out was held and the surgical checklist run.   An infraumbilical incision was made into the abdominal cavity.  A 12 mm Hasson trocar was inserted into the abdominal cavity through the infraumbilical incision and secured with a 0 Vicryl suture.  Three additional trocars were inserted: a 10 mm trocar in the sub-xiphoid location, a 5 mm trocar in the right mid subcostal area, and a 5 mm trocar in the right lateral subcostal area.   The abdomen was explored and the liver, stomach, and bowel that could be seen were unremarkable.   The gall bladder was gangrenous with omentum caked around the gall bladder.  I stripped the omentum off the gall bladder and decompressed it with a needle.  He also had evidence of chronic gall  bladder disease.  Disssection was carried down to the gall bladder/cystic duct junction and the cystic duct isolated.  A clip was placed on the gall bladder side of the cystic duct.   An intra-operative cholangiogram was attempted.   I used a cut off Taut catheter placed through a 14 gauge angiocath in the RUQ.  The Taut catheter was inserted in the cut cystic duct and secured with an endoclip. However, I could not get bile to flow back through the cystic duct.  And I could not inject contrast in the cystic duct.   I tried to milk the cystic duct several times to see if there was an impacted stone, but could not get anything out.  I decided not to try any further for a cholagniogram and I placed an PDS endoloop around the cystic duct and placed 2 clips on the cystic duct.   The gall bladder was bluntly and sharpley dissected from the gall bladder bed.  The back wall of the gall bladder was destroyed because of the gangrenous changes, so I spilled bile and some stones.  I spent 15 minutes cleaning up the spilled material and irrigating the abdomen.   After the gall bladder was removed from the liver, the gall bladder bed and Triangle of [place] were inspected.  There was no bleeding or bile leak.  The gall bladder was placed in a endocatch bag and delivered through the umbilicus.  The abdomen was irrigated with  2,500 cc saline.   Because of the caked omentum, the gangrenous gall bladder and raw gall bladder bed, I left a 65 F Blake drain in the gall bladder bed.  This was sewn in place with a 2-0 Nylon suture.   The trocars were then removed.  I infiltrated 30cc of 1/4% Marcaine into the incisions.  The umbilical port closed with a 0 Vicryl suture and the skin closed with 5-0 vicryl.  The skin was painted with Dermabond.  The patient's sponge and needle count were correct.  The patient was transported to the RR in good condition.  Alphonsa Overall, MD, [facility] Surgery Pager: [phone] Office  phone:  [phone]

## 2013-05-01 NOTE — Anesthesia Postprocedure Evaluation (Signed)
  Anesthesia Post-op Note  Patient: Robert Clayton  Procedure(s) Performed: Procedure(s): LAPAROSCOPIC CHOLECYSTECTOMY WITH INTRAOPERATIVE CHOLANGIOGRAM (N/A)  Patient Location: PACU  Anesthesia Type:General  Level of Consciousness: awake, alert  and oriented  Airway and Oxygen Therapy: Patient Spontanous Breathing  Post-op Pain: mild  Post-op Assessment: Post-op Vital signs reviewed, Patient's Cardiovascular Status Stable, Respiratory Function Stable, Patent Airway, No signs of Nausea or vomiting and Pain level controlled  Post-op Vital Signs: Reviewed and stable  Last Vitals:  Filed Vitals:   05/01/13 1255  BP: 161/81  Pulse: 85  Temp: 36.5 C  Resp: 20    Complications: No apparent anesthesia complications

## 2013-05-01 NOTE — Progress Notes (Addendum)
ANTIBIOTIC CONSULT NOTE - FOLLOW UP  Pharmacy Consult for ampicillin/sulbactam Indication: gangrenous cholecystitis  Allergies  Allergen Reactions  . Amlodipine Other (See Comments)    headache    Patient Measurements: Height: 5\' 11"  (180.3 cm) Weight: 205 lb 7.5 oz (93.2 kg) IBW/kg (Calculated) : 75.3 Adjusted Body Weight:   Vital Signs: Temp: 99.5 F (37.5 C) (04/25 1151) Temp src: Oral (04/25 0840) BP: 137/75 mmHg (04/25 0840) Pulse Rate: 81 (04/25 1151) Intake/Output from previous day: 04/24 0701 - 04/25 0700 In: 875 [I.V.:875] Out: 425 [Urine:425] Intake/Output from this shift: Total I/O In: 1300 [I.V.:1300] Out: 325 [Urine:200; Blood:125]  Labs:  Recent Labs  04/30/13 1130 05/01/13 0437 05/01/13 0926  WBC 16.3* 11.5*  --   HGB 12.8* 12.9* 13.6  PLT 143* 135*  --   CREATININE 2.52* 2.07*  --    Estimated Creatinine Clearance: 33.2 ml/min (by C-G formula based on Cr of 2.07). No results found for this basename: VANCOTROUGH, Corlis Leak, VANCORANDOM, GENTTROUGH, GENTPEAK, GENTRANDOM, TOBRATROUGH, TOBRAPEAK, TOBRARND, AMIKACINPEAK, AMIKACINTROU, AMIKACIN,  in the last 72 hours   Microbiology: Recent Results (from the past 720 hour(s))  SURGICAL PCR SCREEN     Status: None   Collection Time    05/01/13  2:59 AM      Result Value Ref Range Status   MRSA, PCR NEGATIVE  NEGATIVE Final   Staphylococcus aureus NEGATIVE  NEGATIVE Final   Comment:            The Xpert SA Assay (FDA     approved for NASAL specimens     in patients over 37 years of age),     is one component of     a comprehensive surveillance     program.  Test performance has     been validated by [employer] for patients greater     than or equal to 41 year old.     It is not intended     to diagnose infection nor to     guide or monitor treatment.    Anti-infectives   Start     Dose/Rate Route Frequency Ordered Stop   04/30/13 2000  [MAR Hold]  ampicillin-sulbactam (UNASYN) 1.5 g  in sodium chloride 0.9 % 50 mL IVPB     (On MAR Hold since 05/01/13 0932)   1.5 g 100 mL/hr over 30 Minutes Intravenous Every 12 hours 04/30/13 1815     04/30/13 1530  piperacillin-tazobactam (ZOSYN) IVPB 3.375 g     3.375 g 100 mL/hr over 30 Minutes Intravenous  Once 04/30/13 1527 04/30/13 1636      Assessment: 74 yoM with PMHx HTN, CAD, PAD, CKD, DMT2, and prostate cancer presents with acute cholecystitis and ARF. Pt admitted for surgery in next 24-48 hours after ARF and elevated blood sugars improved. Pharmacy consulted to dose unasyn. No antibiotic allergies noted.   4/24 >> Zosyn x1 4/24 >> Unasyn >>   Tmax: 98.4 WBCs: 11.5 Renal: SCr 2.07, CrCl 14ml/min  No cultures ordered   Goal of Therapy:  Dose appropriately for renal function and indication  Plan:   SCr has improved enough to increase Unasyn to 3gm IV q6h - dosed for intra-abdominal infection  Doreene Eland, PharmD, BCPS.   Pager: [phone]  05/01/2013,12:16 PM

## 2013-05-01 NOTE — Transfer of Care (Signed)
Immediate Anesthesia Transfer of Care Note  Patient: Robert Clayton  Procedure(s) Performed: Procedure(s): LAPAROSCOPIC CHOLECYSTECTOMY WITH INTRAOPERATIVE CHOLANGIOGRAM (N/A)  Patient Location: PACU  Anesthesia Type:General  Level of Consciousness: sedated  Airway & Oxygen Therapy: Patient Spontanous Breathing and Patient connected to face mask oxygen  Post-op Assessment: Report given to PACU RN and Post -op Vital signs reviewed and stable  Post vital signs: Reviewed and stable  Complications: No apparent anesthesia complications

## 2013-05-02 DIAGNOSIS — N179 Acute kidney failure, unspecified: Secondary | ICD-10-CM | POA: Diagnosis not present

## 2013-05-02 DIAGNOSIS — K819 Cholecystitis, unspecified: Secondary | ICD-10-CM | POA: Diagnosis not present

## 2013-05-02 DIAGNOSIS — N189 Chronic kidney disease, unspecified: Secondary | ICD-10-CM | POA: Diagnosis not present

## 2013-05-02 LAB — BASIC METABOLIC PANEL
BUN: 30 mg/dL — ABNORMAL HIGH (ref 6–23)
CALCIUM: 8.9 mg/dL (ref 8.4–10.5)
CO2: 21 meq/L (ref 19–32)
CREATININE: 2.1 mg/dL — AB (ref 0.50–1.35)
Chloride: 109 mEq/L (ref 96–112)
GFR calc Af Amer: 33 mL/min — ABNORMAL LOW (ref >90)
GFR calc non Af Amer: 28 mL/min — ABNORMAL LOW (ref >90)
GLUCOSE: 229 mg/dL — AB (ref 70–99)
Potassium: 4.7 mEq/L (ref 3.7–5.3)
Sodium: 141 mEq/L (ref 137–147)

## 2013-05-02 LAB — GLUCOSE, CAPILLARY
GLUCOSE-CAPILLARY: 214 mg/dL — AB (ref 70–99)
GLUCOSE-CAPILLARY: 135 mg/dL — AB (ref 70–99)
Glucose-Capillary: 184 mg/dL — ABNORMAL HIGH (ref 70–99)
Glucose-Capillary: 176 mg/dL — ABNORMAL HIGH (ref 70–99)

## 2013-05-02 MED ORDER — GLUCERNA SHAKE PO LIQD
237.0000 mL | Freq: Three times a day (TID) | ORAL | Status: DC
  Administered 2013-05-02 – 2013-05-03 (×5): 237 mL via ORAL
  Filled 2013-05-02 (×10): qty 237

## 2013-05-02 MED ORDER — INSULIN ASPART PROT & ASPART (70-30 MIX) 100 UNIT/ML ~~LOC~~ SUSP: 30.0000 [IU] | Freq: Two times a day (BID) | SUBCUTANEOUS | Status: DC

## 2013-05-02 MED ORDER — INSULIN ASPART PROT & ASPART (70-30 MIX) 100 UNIT/ML ~~LOC~~ SUSP
28.0000 [IU] | Freq: Two times a day (BID) | SUBCUTANEOUS | Status: DC
  Administered 2013-05-02 – 2013-05-04 (×4): 28 [IU] via SUBCUTANEOUS
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
## 2013-05-02 NOTE — Progress Notes (Signed)
[facility] Progress Note   Robert Clayton [id number] DOB: [birth year] DOA: 04/30/2013 PCP: [name], MD  Brief narrative: Robert Clayton is a 80 y.o. male presenting on 04/30/2013 with  PMH listed below who developed right upper abdominal pain 4 days ago. He has not been able to eat but has been drinking a lot of fluids. He has been taking Tylenol and Alleve but these did not resolve his pain. He has not had fever/chills/ vomiting or diarrhea. He decided to come to the ER today and is found to have acute cholecystitis.   Subjective: Not wanting to eat solids as he believes it is exacerbating his abdominal pain. No nausea. Agrees to trying Glucerna TID until he is able to take more solid food.   Assessment/Plan: Principal Problem:   Cholecystitis - s/p cholecystectomy now with drain  Active Problems: Renal failure (ARF), acute on chronic -prerenal- baseline Cr~ 1.6 - slight bump in Cr today -  cont to hydrate and follow    GOUT Cont allopurinol     DM (diabetes mellitus), type 2 On 75/25 at home but not available here - will increase 70/30 today as sugars slightly elevated  HTN - cont b blocker-    Code Status: Full code Family Communication: with wife Disposition Plan: home  Consultants: surgery  Antibiotics: Antibiotics Given (last 72 hours)   Date/Time Action Medication Dose Rate   04/30/13 2107 Given   [MAR Hold] ampicillin-sulbactam (UNASYN) 1.5 g in sodium chloride 0.9 % 50 mL IVPB (On MAR Hold since 05/01/13 0932) 1.5 g 100 mL/hr   05/01/13 0829 Given   [MAR Hold] ampicillin-sulbactam (UNASYN) 1.5 g in sodium chloride 0.9 % 50 mL IVPB (On MAR Hold since 05/01/13 0932) 1.5 g 100 mL/hr   05/01/13 1550 Given   Ampicillin-Sulbactam (UNASYN) 3 g in sodium chloride 0.9 % 100 mL IVPB 3 g 100 mL/hr   05/01/13 2334 Given   Ampicillin-Sulbactam (UNASYN) 3 g in sodium chloride 0.9 % 100 mL IVPB 3 g 100 mL/hr   05/02/13 0546 Given   Ampicillin-Sulbactam  (UNASYN) 3 g in sodium chloride 0.9 % 100 mL IVPB 3 g 100 mL/hr   05/02/13 1208 Given   Ampicillin-Sulbactam (UNASYN) 3 g in sodium chloride 0.9 % 100 mL IVPB 3 g 100 mL/hr       DVT prophylaxis: lovenox  Objective: Filed Weights   04/30/13 1715 04/30/13 1814  Weight: 92.7 kg (204 lb 5.9 oz) 93.2 kg (205 lb 7.5 oz)   Blood pressure 110/57, pulse 87, temperature 99 F (37.2 C), temperature source Oral, resp. rate 18, height 5\' 11"  (1.803 m), weight 93.2 kg (205 lb 7.5 oz), SpO2 94.00%.  Intake/Output Summary (Last 24 hours) at 05/02/13 1334 Last data filed at 05/02/13 1203  Gross per 24 hour  Intake 2168.33 ml  Output   1355 ml  Net 813.33 ml     Exam: General: No acute respiratory distress Lungs: Clear to auscultation bilaterally without wheezes or crackles Cardiovascular: Regular rate and rhythm without murmur gallop or rub normal S1 and S2 Abdomen: + tender, nondistended, soft, bowel sounds positive, no rebound, no ascites, no appreciable mass Extremities: No significant cyanosis, clubbing, or edema bilateral lower extremities  Data Reviewed: Basic Metabolic Panel:  Recent Labs Lab 04/30/13 1130 05/01/13 0437 05/01/13 0926 05/02/13 0527  NA 140 140 144 141  K 4.5 5.6* 4.6 4.7  CL 101 107  --  109  CO2 21 20  --  21  GLUCOSE 364* 288*  286* 229*  BUN 50* 39*  --  30*  CREATININE 2.52* 2.07*  --  2.10*  CALCIUM 9.9 9.3  --  8.9   Liver Function Tests:  Recent Labs Lab 04/30/13 1130 05/01/13 0437  AST 16 119*  ALT 11 76*  ALKPHOS 108 164*  BILITOT 0.8 1.0  PROT 7.9 6.9  ALBUMIN 3.1* 2.4*    Recent Labs Lab 04/30/13 1130  LIPASE 12   No results found for this basename: AMMONIA,  in the last 168 hours CBC:  Recent Labs Lab 04/30/13 1130 05/01/13 0437 05/01/13 0926  WBC 16.3* 11.5*  --   NEUTROABS 13.4*  --   --   HGB 12.8* 12.9* 13.6  HCT 39.1 39.2 40.0  MCV 89.7 90.5  --   PLT 143* 135*  --    Cardiac Enzymes: No results found for  this basename: CKTOTAL, CKMB, CKMBINDEX, TROPONINI,  in the last 168 hours BNP (last 3 results) No results found for this basename: PROBNP,  in the last 8760 hours CBG:  Recent Labs Lab 05/01/13 1256 05/01/13 1640 05/01/13 2154 05/02/13 0723 05/02/13 1140  GLUCAP 258* 269* 187* 214* 184*    Recent Results (from the past 240 hour(s))  URINE CULTURE     Status: None   Collection Time    04/30/13 11:44 AM      Result Value Ref Range Status   Colony Count 15,000 COLONIES/ML   Final   Organism ID, Bacteria Multiple bacterial morphotypes present, none   Final   Organism ID, Bacteria predominant. Suggest appropriate recollection if    Final   Organism ID, Bacteria clinically indicated.   Final  SURGICAL PCR SCREEN     Status: None   Collection Time    05/01/13  2:59 AM      Result Value Ref Range Status   MRSA, PCR NEGATIVE  NEGATIVE Final   Staphylococcus aureus NEGATIVE  NEGATIVE Final   Comment:            The Xpert SA Assay (FDA     approved for NASAL specimens     in patients over 46 years of age),     is one component of     a comprehensive surveillance     program.  Test performance has     been validated by [employer] for patients greater     than or equal to 53 year old.     It is not intended     to diagnose infection nor to     guide or monitor treatment.     Studies:  Recent x-ray studies have been reviewed in detail by the Attending Physician  Scheduled Meds:  Scheduled Meds: . allopurinol  300 mg Oral Daily  . ampicillin-sulbactam (UNASYN) IV  3 g Intravenous 4 times per day  . enoxaparin (LOVENOX) injection  30 mg Subcutaneous Q24H  . feeding supplement (GLUCERNA SHAKE)  237 mL Oral TID BM  . insulin aspart  0-15 Units Subcutaneous TID WC  . insulin aspart protamine- aspart  25 Units Subcutaneous BID WC  . levETIRAcetam  500 mg Oral BID  . metoprolol  50 mg Oral BID   Continuous Infusions: . sodium chloride 100 mL/hr at 05/02/13 0548     Time spent on care of this patient: 35 min   Debbe Odea, MD 05/02/2013, 1:34 PM  LOS: 2 days   [facility] Office  [phone] Pager - Text Page per Shea Evans  If 7PM-7AM, please contact night-coverage [email or web address]

## 2013-05-02 NOTE — Progress Notes (Signed)
General Surgery Note  LOS: 2 days  POD -  1 Day Post-Op  Assessment/Plan: 1.  LAPAROSCOPIC CHOLECYSTECTOMY - 05/01/2013 - Robert Clayton  For gangrenous cholecystitis.  Unasyn - 04/30/2013 >>>  Plan:  Keep here at least until tomorrow.  If doing well, JP can come out tomorrow and he could go home.  He ought to get a total of 7 days of antibiotics.  2.  DVT prophylaxis - Lovenox 3.  Chronic renal insufficiency  Creat - 2.1 - 05/02/2013 4.  DM  Glucose - 229 - 05/02/2013 5.  Gout  Principal Problem:   Cholecystitis Active Problems:   DM (diabetes mellitus), type 2, uncontrolled, periph vascular complic   Hyperlipidemia LDL goal < 70   GOUT   Hypertensive renal disease   Renal failure (ARF), acute on chronic  Subjective:  Sore RUQ.  Does not feel like moving, but did walk in the hall yesterday. Objective:   Filed Vitals:   05/02/13 0500  BP: 166/70  Pulse: 91  Temp: 98.3 F (36.8 C)  Resp: 18     Intake/Output from previous day:  04/25 0701 - 04/26 0700 In: 3138.3 [I.V.:3038.3; IV Piggyback:100] Out: [id number] [Urine:1025; Drains:115; Blood:125]  Intake/Output this shift:      Physical Exam:   General: AA M who is alert and oriented.    HEENT: Normal. Pupils equal. .   Lungs: Modest inspiratory effor.  Only 600 CC on IS.   Abdomen: Soft, few BS.   Wound: Look good.  JP - no drainage last shift.   Lab Results:    Recent Labs  04/30/13 1130 05/01/13 0437 05/01/13 0926  WBC 16.3* 11.5*  --   HGB 12.8* 12.9* 13.6  HCT 39.1 39.2 40.0  PLT 143* 135*  --     BMET   Recent Labs  05/01/13 0437 05/01/13 0926 05/02/13 0527  NA 140 144 141  K 5.6* 4.6 4.7  CL 107  --  109  CO2 20  --  21  GLUCOSE 288* 286* 229*  BUN 39*  --  30*  CREATININE 2.07*  --  2.10*  CALCIUM 9.3  --  8.9    PT/INR  No results found for this basename: LABPROT, INR,  in the last 72 hours  ABG  No results found for this basename: PHART, PCO2, PO2, HCO3,  in the last 72  hours   Studies/Results:  Ct Abdomen Pelvis Wo Contrast  04/30/2013   CLINICAL DATA:  Hematuria, right upper quadrant pain, elevated WBC  EXAM: CT ABDOMEN AND PELVIS WITHOUT CONTRAST  TECHNIQUE: Multidetector CT imaging of the abdomen and pelvis was performed following the standard protocol without intravenous contrast.  COMPARISON:  03/25/2008  FINDINGS: There is is patchy infiltrate with air bronchogram right base posteriorly.  The study is limited without IV contrast. Unenhanced liver shows no biliary ductal dilatation. A distended gallbladder is noted. There is thickening of gallbladder wall up to 6 mm. Tiny layering gallstones are noted within gallbladder. Stranding of pericholecystic fat. Findings are consistent with acute cholecystitis. Clinical correlation is necessary. There is fatty replaced pancreas. The adrenal glands are unremarkable. Spleen is unremarkable. Unenhanced kidneys shows mild cortical thinning. Mild atrophic right kidney. A cyst in midpole of the right kidney measures 3.6 cm. Atherosclerotic calcifications of abdominal aorta and iliac arteries. No nephrolithiasis. No calcified ureteral calculi. No pericecal inflammation. Normal appendix. A distended urinary bladder is noted. . Radiation seeds are noted prostate gland region. Some stool noted within rectum.  No inguinal adenopathy. No destructive bony lesions are noted within pelvis.  Sagittal images of the spine are unremarkable.  IMPRESSION: 1. Distended gallbladder with abnormal thickening of the wall and pericholecystic stranding consistent with acute cholecystitis. 2. Patchy infiltrate with air bronchogram right base posteriorly suspicious for pneumonia. 3. No hydronephrosis or hydroureter.  Right renal cyst is noted. 4. Radiation seeds are noted prostate gland region. These results were called by telephone at the time of interpretation on 04/30/2013 at 2:50 PM to Dr. Blanchie Dessert , who verbally acknowledged these results.    Electronically Signed   By: Lahoma Crocker M.D.   On: 04/30/2013 14:50   Dg Abd Acute W/chest  04/30/2013   CLINICAL DATA:  Abdominal pain, rule out obstruction  EXAM: ACUTE ABDOMEN SERIES (ABDOMEN 2 VIEW & CHEST 1 VIEW)  COMPARISON:  Abdomen, 05/23/2010  FINDINGS: Mild bibasilar atelectasis. Negative for heart failure. Mild cardiac enlargement with coronary artery stent. Negative for pneumonia.  Prostate seeds. Negative for bowel obstruction. No free air. Negative for ileus. No acute bony abnormality.  IMPRESSION: Mild bibasilar atelectasis. No acute abnormality in the chest or abdomen.   Electronically Signed   By: Franchot Gallo M.D.   On: 04/30/2013 12:31   Dg C-arm 1-60 Min-no Report  05/01/2013   CLINICAL DATA: IOC   C-ARM 1-60 MINUTES  Fluoroscopy was utilized by the requesting physician.  No radiographic  interpretation.      Anti-infectives:   Anti-infectives   Start     Dose/Rate Route Frequency Ordered Stop   05/01/13 2000  ampicillin-sulbactam (UNASYN) 1.5 g in sodium chloride 0.9 % 50 mL IVPB  Status:  Discontinued     1.5 g 100 mL/hr over 30 Minutes Intravenous Every 12 hours 05/01/13 1224 05/01/13 1333   05/01/13 1600  Ampicillin-Sulbactam (UNASYN) 3 g in sodium chloride 0.9 % 100 mL IVPB     3 g 100 mL/hr over 60 Minutes Intravenous 4 times per day 05/01/13 1333     04/30/13 2000  [MAR Hold]  ampicillin-sulbactam (UNASYN) 1.5 g in sodium chloride 0.9 % 50 mL IVPB  Status:  Discontinued     (On MAR Hold since 05/01/13 0932)   1.5 g 100 mL/hr over 30 Minutes Intravenous Every 12 hours 04/30/13 1815 05/01/13 1224   04/30/13 1530  piperacillin-tazobactam (ZOSYN) IVPB 3.375 g     3.375 g 100 mL/hr over 30 Minutes Intravenous  Once 04/30/13 1527 04/30/13 1636      Alphonsa Overall, MD, FACS Pager: [facility] Office: [phone] 05/02/2013

## 2013-05-03 ENCOUNTER — Encounter (HOSPITAL_COMMUNITY): Payer: Self-pay | Admitting: Surgery

## 2013-05-03 DIAGNOSIS — K810 Acute cholecystitis: Secondary | ICD-10-CM | POA: Diagnosis not present

## 2013-05-03 DIAGNOSIS — N179 Acute kidney failure, unspecified: Secondary | ICD-10-CM | POA: Diagnosis not present

## 2013-05-03 DIAGNOSIS — N189 Chronic kidney disease, unspecified: Secondary | ICD-10-CM | POA: Diagnosis not present

## 2013-05-03 DIAGNOSIS — K819 Cholecystitis, unspecified: Secondary | ICD-10-CM | POA: Diagnosis not present

## 2013-05-03 LAB — BASIC METABOLIC PANEL
BUN: 29 mg/dL — ABNORMAL HIGH (ref 6–23)
CHLORIDE: 111 meq/L (ref 96–112)
CO2: 21 mEq/L (ref 19–32)
Calcium: 9 mg/dL (ref 8.4–10.5)
Creatinine, Ser: 2.09 mg/dL — ABNORMAL HIGH (ref 0.50–1.35)
GFR calc non Af Amer: 28 mL/min — ABNORMAL LOW (ref >90)
GFR, EST AFRICAN AMERICAN: 33 mL/min — AB (ref >90)
GLUCOSE: 92 mg/dL (ref 70–99)
POTASSIUM: 4.3 meq/L (ref 3.7–5.3)
Sodium: 144 mEq/L (ref 137–147)

## 2013-05-03 LAB — GLUCOSE, CAPILLARY
GLUCOSE-CAPILLARY: 84 mg/dL (ref 70–99)
Glucose-Capillary: 79 mg/dL (ref 70–99)
Glucose-Capillary: 102 mg/dL — ABNORMAL HIGH (ref 70–99)
Glucose-Capillary: 73 mg/dL (ref 70–99)

## 2013-05-03 MED ORDER — AMOXICILLIN-POT CLAVULANATE 875-125 MG PO TABS
1.0000 | ORAL_TABLET | Freq: Two times a day (BID) | ORAL | Status: DC
  Administered 2013-05-03 – 2013-05-04 (×2): 1 via ORAL
  Filled 2013-05-03 (×3): qty 1

## 2013-05-03 MED ORDER — ENOXAPARIN SODIUM 40 MG/0.4ML ~~LOC~~ SOLN
40.0000 mg | SUBCUTANEOUS | Status: DC
  Administered 2013-05-03: 40 mg via SUBCUTANEOUS
  Filled 2013-05-03 (×2): qty 0.4

## 2013-05-03 MED ORDER — SODIUM CHLORIDE 0.45 % IV SOLN
INTRAVENOUS | Status: DC
  Administered 2013-05-03: 75 mL/h via INTRAVENOUS
  Administered 2013-05-04: 75 mL via INTRAVENOUS

## 2013-05-03 NOTE — Progress Notes (Signed)
[facility] Progress Note   Robert Clayton [id number] DOB: 12/15/1930 DOA: 04/30/2013 PCP: [name], MD  Brief narrative: Robert Clayton is a 80 y.o. male presenting on 04/30/2013 with  PMH listed below who developed right upper abdominal pain 4 days ago. He has not been able to eat but has been drinking a lot of fluids. He has been taking Tylenol and Alleve but these did not resolve his pain. He has not had fever/chills/ vomiting or diarrhea. He decided to come to the ER today and is found to have acute cholecystitis.   Subjective: Beginning to eat solids this AM. No complaints.   Assessment/Plan: Principal Problem:   Cholecystitis - s/p cholecystectomy now with drain  Active Problems: Renal failure (ARF), acute on chronic -prerenal- baseline Cr~ 1.6 -  cont to hydrate and follow- very slow improvement - hopefull home in AM    GOUT Cont allopurinol     DM (diabetes mellitus), type 2 On 75/25 at home but not available here - cont 70/30 today at current dose  HTN - cont b blocker-    Code Status: Full code Family Communication: with wife Disposition Plan: home  Consultants: surgery  Antibiotics: Antibiotics Given (last 72 hours)   Date/Time Action Medication Dose Rate   04/30/13 2107 Given   [MAR Hold] ampicillin-sulbactam (UNASYN) 1.5 g in sodium chloride 0.9 % 50 mL IVPB (On MAR Hold since 05/01/13 0932) 1.5 g 100 mL/hr   05/01/13 0829 Given   [MAR Hold] ampicillin-sulbactam (UNASYN) 1.5 g in sodium chloride 0.9 % 50 mL IVPB (On MAR Hold since 05/01/13 0932) 1.5 g 100 mL/hr   05/01/13 1550 Given   Ampicillin-Sulbactam (UNASYN) 3 g in sodium chloride 0.9 % 100 mL IVPB 3 g 100 mL/hr   05/01/13 2334 Given   Ampicillin-Sulbactam (UNASYN) 3 g in sodium chloride 0.9 % 100 mL IVPB 3 g 100 mL/hr   05/02/13 0546 Given   Ampicillin-Sulbactam (UNASYN) 3 g in sodium chloride 0.9 % 100 mL IVPB 3 g 100 mL/hr   05/02/13 1208 Given   Ampicillin-Sulbactam (UNASYN) 3 g  in sodium chloride 0.9 % 100 mL IVPB 3 g 100 mL/hr   05/02/13 1741 Given   Ampicillin-Sulbactam (UNASYN) 3 g in sodium chloride 0.9 % 100 mL IVPB 3 g 100 mL/hr   05/02/13 2323 Given   Ampicillin-Sulbactam (UNASYN) 3 g in sodium chloride 0.9 % 100 mL IVPB 3 g 100 mL/hr   05/03/13 0520 Given   Ampicillin-Sulbactam (UNASYN) 3 g in sodium chloride 0.9 % 100 mL IVPB 3 g 100 mL/hr   05/03/13 1247 Given   Ampicillin-Sulbactam (UNASYN) 3 g in sodium chloride 0.9 % 100 mL IVPB 3 g 100 mL/hr       DVT prophylaxis: lovenox  Objective: Filed Weights   04/30/13 1715 04/30/13 1814  Weight: 92.7 kg (204 lb 5.9 oz) 93.2 kg (205 lb 7.5 oz)   Blood pressure 174/84, pulse 81, temperature 99.8 F (37.7 C), temperature source Oral, resp. rate 16, height 5\' 11"  (1.803 m), weight 93.2 kg (205 lb 7.5 oz), SpO2 94.00%.  Intake/Output Summary (Last 24 hours) at 05/03/13 1411 Last data filed at 05/03/13 0600  Gross per 24 hour  Intake   1900 ml  Output    280 ml  Net   1620 ml     Exam: General: No acute respiratory distress Lungs: Clear to auscultation bilaterally without wheezes or crackles Cardiovascular: Regular rate and rhythm without murmur gallop or rub normal S1 and S2  Abdomen: + tender, nondistended, soft, bowel sounds positive, no rebound, no ascites, no appreciable mass Extremities: No significant cyanosis, clubbing, or edema bilateral lower extremities  Data Reviewed: Basic Metabolic Panel:  Recent Labs Lab 04/30/13 1130 05/01/13 0437 05/01/13 0926 05/02/13 0527 05/03/13 0405  NA 140 140 144 141 144  K 4.5 5.6* 4.6 4.7 4.3  CL 101 107  --  109 111  CO2 21 20  --  21 21  GLUCOSE 364* 288* 286* 229* 92  BUN 50* 39*  --  30* 29*  CREATININE 2.52* 2.07*  --  2.10* 2.09*  CALCIUM 9.9 9.3  --  8.9 9.0   Liver Function Tests:  Recent Labs Lab 04/30/13 1130 05/01/13 0437  AST 16 119*  ALT 11 76*  ALKPHOS 108 164*  BILITOT 0.8 1.0  PROT 7.9 6.9  ALBUMIN 3.1* 2.4*     Recent Labs Lab 04/30/13 1130  LIPASE 12   No results found for this basename: AMMONIA,  in the last 168 hours CBC:  Recent Labs Lab 04/30/13 1130 05/01/13 0437 05/01/13 0926  WBC 16.3* 11.5*  --   NEUTROABS 13.4*  --   --   HGB 12.8* 12.9* 13.6  HCT 39.1 39.2 40.0  MCV 89.7 90.5  --   PLT 143* 135*  --    Cardiac Enzymes: No results found for this basename: CKTOTAL, CKMB, CKMBINDEX, TROPONINI,  in the last 168 hours BNP (last 3 results) No results found for this basename: PROBNP,  in the last 8760 hours CBG:  Recent Labs Lab 05/02/13 1140 05/02/13 1730 05/02/13 2145 05/03/13 0820 05/03/13 1345  GLUCAP 184* 176* 135* 84 79    Recent Results (from the past 240 hour(s))  URINE CULTURE     Status: None   Collection Time    04/30/13 11:44 AM      Result Value Ref Range Status   Colony Count 15,000 COLONIES/ML   Final   Organism ID, Bacteria Multiple bacterial morphotypes present, none   Final   Organism ID, Bacteria predominant. Suggest appropriate recollection if    Final   Organism ID, Bacteria clinically indicated.   Final  SURGICAL PCR SCREEN     Status: None   Collection Time    05/01/13  2:59 AM      Result Value Ref Range Status   MRSA, PCR NEGATIVE  NEGATIVE Final   Staphylococcus aureus NEGATIVE  NEGATIVE Final   Comment:            The Xpert SA Assay (FDA     approved for NASAL specimens     in patients over 56 years of age),     is one component of     a comprehensive surveillance     program.  Test performance has     been validated by [employer] for patients greater     than or equal to 39 year old.     It is not intended     to diagnose infection nor to     guide or monitor treatment.     Studies:  Recent x-ray studies have been reviewed in detail by the Attending Physician  Scheduled Meds:  Scheduled Meds: . allopurinol  300 mg Oral Daily  . amoxicillin-clavulanate  1 tablet Oral Q12H  . enoxaparin (LOVENOX) injection   40 mg Subcutaneous Q24H  . feeding supplement (GLUCERNA SHAKE)  237 mL Oral TID BM  . insulin aspart  0-15 Units  Subcutaneous TID WC  . insulin aspart protamine- aspart  28 Units Subcutaneous BID WC  . levETIRAcetam  500 mg Oral BID  . metoprolol  50 mg Oral BID   Continuous Infusions: . sodium chloride 100 mL/hr at 05/03/13 0524    Time spent on care of this patient: 35 min   Debbe Odea, MD 05/03/2013, 2:11 PM  LOS: 3 days   [facility] Office  [phone] Pager - Text Page per Shea Evans   If 7PM-7AM, please contact night-coverage [email or web address]

## 2013-05-03 NOTE — Progress Notes (Signed)
Patient ID: Robert Clayton, male   DOB: [birth year], 80 y.o.   MRN: [id number] 2 Days Post-Op  Subjective: Pt feels ok this morning.  Tolerating his diet.  Weak.  Objective: Vital signs in last 24 hours: Temp:  [98.8 F (37.1 C)-101.3 F (38.5 C)] 99.8 F (37.7 C) (04/27 0500) Pulse Rate:  [77-95] 81 (04/27 0500) Resp:  [16-18] 16 (04/27 0500) BP: (155-174)/(84-99) 174/84 mmHg (04/27 0500) SpO2:  [91 %-95 %] 94 % (04/27 0500) Last BM Date: 04/29/13  Intake/Output from previous day: 04/26 0701 - 04/27 0700 In: 3080 [P.O.:80; I.V.:2400; IV Piggyback:600] Out: 955 [Urine:900; Drains:55] Intake/Output this shift:    PE: Abd: soft, appropriately tender, +BS, ND, incisions c/d/i, JP with minimal serous output  Lab Results:   Recent Labs  05/01/13 0437 05/01/13 0926  WBC 11.5*  --   HGB 12.9* 13.6  HCT 39.2 40.0  PLT 135*  --    BMET  Recent Labs  05/02/13 0527 05/03/13 0405  NA 141 144  K 4.7 4.3  CL 109 111  CO2 21 21  GLUCOSE 229* 92  BUN 30* 29*  CREATININE 2.10* 2.09*  CALCIUM 8.9 9.0   PT/INR No results found for this basename: LABPROT, INR,  in the last 72 hours CMP     Component Value Date/Time   NA 144 05/03/2013 0405   K 4.3 05/03/2013 0405   CL 111 05/03/2013 0405   CO2 21 05/03/2013 0405   GLUCOSE 92 05/03/2013 0405   BUN 29* 05/03/2013 0405   CREATININE 2.09* 05/03/2013 0405   CALCIUM 9.0 05/03/2013 0405   PROT 6.9 05/01/2013 0437   ALBUMIN 2.4* 05/01/2013 0437   AST 119* 05/01/2013 0437   ALT 76* 05/01/2013 0437   ALKPHOS 164* 05/01/2013 0437   BILITOT 1.0 05/01/2013 0437   GFRNONAA 28* 05/03/2013 0405   GFRAA 33* 05/03/2013 0405   Lipase     Component Value Date/Time   LIPASE 12 04/30/2013 1130       Studies/Results: No results found.  Anti-infectives: Anti-infectives   Start     Dose/Rate Route Frequency Ordered Stop   05/01/13 2000  ampicillin-sulbactam (UNASYN) 1.5 g in sodium chloride 0.9 % 50 mL IVPB  Status:  Discontinued     1.5  g 100 mL/hr over 30 Minutes Intravenous Every 12 hours 05/01/13 1224 05/01/13 1333   05/01/13 1600  Ampicillin-Sulbactam (UNASYN) 3 g in sodium chloride 0.9 % 100 mL IVPB     3 g 100 mL/hr over 60 Minutes Intravenous 4 times per day 05/01/13 1333     04/30/13 2000  [MAR Hold]  ampicillin-sulbactam (UNASYN) 1.5 g in sodium chloride 0.9 % 50 mL IVPB  Status:  Discontinued     (On MAR Hold since 05/01/13 0932)   1.5 g 100 mL/hr over 30 Minutes Intravenous Every 12 hours 04/30/13 1815 05/01/13 1224   04/30/13 1530  piperacillin-tazobactam (ZOSYN) IVPB 3.375 g     3.375 g 100 mL/hr over 30 Minutes Intravenous  Once 04/30/13 1527 04/30/13 1636       Assessment/Plan  1. POD 2, s/p lap chole for gangrenous cholecystitis 2. CKD  Plan: 1. Patient doing well surgically.  Will plan to pull drain tomorrow. 2. Stable for dc from our standpoint when medically stable 3. Will dc IV unasyn and switch to oral Augmentin.  He will need a total of 7 days of abx. D4/7 today  LOS: 3 days    Henreitta Cea 05/03/2013, 1:23 PM Pager:  [phone]  

## 2013-05-03 NOTE — Progress Notes (Signed)
General Surgery [facility]  Patient seen and examined.  Drain with thin serous output - can be removed before discharge.  Anticipate discharge tomorrow if continued clinical improvement.  Earnstine Regal, MD, [facility] Office: [phone]

## 2013-05-04 DIAGNOSIS — N179 Acute kidney failure, unspecified: Secondary | ICD-10-CM | POA: Diagnosis not present

## 2013-05-04 DIAGNOSIS — I129 Hypertensive chronic kidney disease with stage 1 through stage 4 chronic kidney disease, or unspecified chronic kidney disease: Secondary | ICD-10-CM | POA: Diagnosis not present

## 2013-05-04 DIAGNOSIS — K819 Cholecystitis, unspecified: Secondary | ICD-10-CM | POA: Diagnosis not present

## 2013-05-04 DIAGNOSIS — E1159 Type 2 diabetes mellitus with other circulatory complications: Secondary | ICD-10-CM | POA: Diagnosis not present

## 2013-05-04 LAB — BASIC METABOLIC PANEL
BUN: 27 mg/dL — ABNORMAL HIGH (ref 6–23)
CHLORIDE: 111 meq/L (ref 96–112)
CO2: 20 meq/L (ref 19–32)
Calcium: 8.9 mg/dL (ref 8.4–10.5)
Creatinine, Ser: 1.98 mg/dL — ABNORMAL HIGH (ref 0.50–1.35)
GFR calc Af Amer: 35 mL/min — ABNORMAL LOW (ref >90)
GFR calc non Af Amer: 30 mL/min — ABNORMAL LOW (ref >90)
Glucose, Bld: 87 mg/dL (ref 70–99)
POTASSIUM: 4.1 meq/L (ref 3.7–5.3)
SODIUM: 144 meq/L (ref 137–147)

## 2013-05-04 LAB — GLUCOSE, CAPILLARY
GLUCOSE-CAPILLARY: 67 mg/dL — AB (ref 70–99)
Glucose-Capillary: 80 mg/dL (ref 70–99)
Glucose-Capillary: 103 mg/dL — ABNORMAL HIGH (ref 70–99)
Glucose-Capillary: 106 mg/dL — ABNORMAL HIGH (ref 70–99)
Glucose-Capillary: 52 mg/dL — ABNORMAL LOW (ref 70–99)
Glucose-Capillary: 79 mg/dL (ref 70–99)

## 2013-05-04 MED ORDER — AMLODIPINE BESYLATE 5 MG PO TABS: 10.0000 mg | ORAL_TABLET | Freq: Every day | ORAL | Status: DC

## 2013-05-04 MED ORDER — AMOXICILLIN-POT CLAVULANATE 875-125 MG PO TABS: 1.0000 | ORAL_TABLET | Freq: Two times a day (BID) | ORAL | Status: DC

## 2013-05-04 NOTE — Discharge Instructions (Signed)
Laparoscopic Cholecystectomy, Care After Refer to this sheet in the next few weeks. These instructions provide you with information on caring for yourself after your procedure. Your health care provider may also give you more specific instructions. Your treatment has been planned according to current medical practices, but problems sometimes occur. Call your health care provider if you have any problems or questions after your procedure. WHAT TO EXPECT AFTER THE PROCEDURE After your procedure, it is typical to have the following:  Pain at your incision sites. You will be given pain medicines to control the pain.  Mild nausea or vomiting. This should improve after the first 24 hours.  Bloating and possibly shoulder pain from the gas used during the procedure. This will improve after the first 24 hours. HOME CARE INSTRUCTIONS   Change bandages (dressings) as directed by your health care provider.  Keep the wound dry and clean. You may wash the wound gently with soap and water. Gently blot or dab the area dry.  Do not take baths or use swimming pools or hot tubs for 2 weeks or until your health care provider approves.  Only take over-the-counter or prescription medicines as directed by your health care provider.  Continue your normal diet as directed by your health care provider.  Do not lift anything heavier than 10 pounds (4.5 kg) until your health care provider approves.  Do not play contact sports for 1 week or until your health care provider approves. SEEK MEDICAL CARE IF:   You have redness, swelling, or increasing pain in the wound.  You notice yellowish-white fluid (pus) coming from the wound.  You have drainage from the wound that lasts longer than 1 day.  You notice a bad smell coming from the wound or dressing.  Your surgical cuts (incisions) break open. SEEK IMMEDIATE MEDICAL CARE IF:   You develop a rash.  You have difficulty breathing.  You have chest pain.  You  have a fever.  You have increasing pain in the shoulders (shoulder strap areas).  You have dizzy episodes or faint while standing.  You have severe abdominal pain.  You feel sick to your stomach (nauseous) or throw up (vomit) and this lasts for more than 1 day. Document Released: 12/24/2004 Document Revised: 10/14/2012 Document Reviewed: 08/05/2012 [facility] Patient Information 2014 [place].  CCS ______[facility] LAPAROSCOPIC SURGERY: POST OP INSTRUCTIONS Always review your discharge instruction sheet given to you by the facility where your surgery was performed. IF YOU HAVE DISABILITY OR FAMILY LEAVE FORMS, YOU MUST BRING THEM TO THE OFFICE FOR PROCESSING.   DO NOT GIVE THEM TO YOUR DOCTOR.  1. A prescription for pain medication may be given to you upon discharge.  Take your pain medication as prescribed, if needed.  If narcotic pain medicine is not needed, then you may take acetaminophen (Tylenol) or ibuprofen (Advil) as needed. 2. Take your usually prescribed medications unless otherwise directed. 3. If you need a refill on your pain medication, please contact your pharmacy.  They will contact our office to request authorization. Prescriptions will not be filled after 5pm or on week-ends. 4. You should follow a light diet the first few days after arrival home, such as soup and crackers, etc.  Be sure to include lots of fluids daily. 5. Most patients will experience some swelling and bruising in the area of the incisions.  Ice packs will help.  Swelling and bruising can take several days to resolve.  6. It is common  to experience some constipation if taking pain medication after surgery.  Increasing fluid intake and taking a stool softener (such as Colace) will usually help or prevent this problem from occurring.  A mild laxative (Milk of Magnesia or Miralax) should be taken according to package instructions if there are no bowel movements after 48  hours. 7. Unless discharge instructions indicate otherwise, you may remove your bandages 24-48 hours after surgery, and you may shower at that time.  You may have steri-strips (small skin tapes) in place directly over the incision.  These strips should be left on the skin for 7-10 days.  If your surgeon used skin glue on the incision, you may shower in 24 hours.  The glue will flake off over the next 2-3 weeks.  Any sutures or staples will be removed at the office during your follow-up visit. 8. ACTIVITIES:  You may resume regular (light) daily activities beginning the next day--such as daily self-care, walking, climbing stairs--gradually increasing activities as tolerated.  You may have sexual intercourse when it is comfortable.  Refrain from any heavy lifting or straining until approved by your doctor. a. You may drive when you are no longer taking prescription pain medication, you can comfortably wear a seatbelt, and you can safely maneuver your car and apply brakes. b. RETURN TO WORK:  __________________________________________________________ 9. You should see your doctor in the office for a follow-up appointment approximately 2-3 weeks after your surgery.  Make sure that you call for this appointment within a day or two after you arrive home to insure a convenient appointment time. 10. OTHER INSTRUCTIONS: __________________________________________________________________________________________________________________________ __________________________________________________________________________________________________________________________ WHEN TO CALL YOUR DOCTOR: 1. Fever over 101.0 2. Inability to urinate 3. Continued bleeding from incision. 4. Increased pain, redness, or drainage from the incision. 5. Increasing abdominal pain  The clinic staff is available to answer your questions during regular business hours.  Please dont hesitate to call and ask to speak to one of the nurses for  clinical concerns.  If you have a medical emergency, go to the nearest emergency room or call 911.  A surgeon from [facility] is always on call at the hospital. [address] [place], [place], [place]  [address] ? [address], [place],    [address] [phone] ? [phone] ? FAX [phone] Web site: [email or web address]

## 2013-05-04 NOTE — Progress Notes (Signed)
General Surgery [facility]  Slow progress.  Drain removed.  Home when stable from medical standpoint.  Earnstine Regal, MD, [facility] Office: [phone]

## 2013-05-04 NOTE — Discharge Summary (Addendum)
Physician Discharge Summary  Robert Clayton [id number] DOB: 03-17-1930 DOA: 04/30/2013  PCP: [name], MD  Admit date: 04/30/2013 Discharge date: 05/04/2013  Time spent: >35 minutes  F/u:  ACE I on hold due to ARF- can resume if BUN/ Cr improve as outpt  Discharge Diagnoses:  Principal Problem:   Cholecystitis resulting in sepsis Active Problems:   DM (diabetes mellitus), type 2, uncontrolled, periph vascular complic   Hyperlipidemia LDL goal < 70   GOUT   Hypertensive renal disease   Renal failure (ARF), acute on chronic   Discharge Condition: stable  Diet recommendation: heart healthy and diabetic  Filed Weights   04/30/13 1715 04/30/13 1814  Weight: 92.7 kg (204 lb 5.9 oz) 93.2 kg (205 lb 7.5 oz)    History of present illness:  Robert Clayton is a 80 y.o. male presenting on 04/30/2013  who developed right upper abdominal pain 4 days ago. He has not been able to eat but has been drinking a lot of fluids. He has been taking Tylenol and Alleve but these did not resolve his pain. He has not had fever/chills/ vomiting or diarrhea. He decided to come to the ER today and is found to have acute cholecystitis.    Hospital Course:  Principal Problem:  Cholecystitis  - s/p cholecystectomy now with drain - drain removed today by surgery  - recommended 7 days of antibiotics -since he has been receiving Unasyn,  will d/c home with Augmentin  Active Problems:  Renal failure (ARF), acute on chronic  -prerenal- baseline Cr~ 1.6  -Cr improved from 2.52 to 1.8 with hydration - ACE I continues to be on hold due to renal failure  GOUT  Cont allopurinol   DM (diabetes mellitus), type 2  - On 75/25 at home but not available here therefore we have been administering 70/30  - he can continue to take usual doses of Insulin at home- he is eating quite well  HTN  - cont b blocker- - since ACE held, have added Amlodipine   Procedures:  4/25- lap chole for a gangrenous  gallbladder  Consultations:  surgery  Discharge Exam: Filed Vitals:   05/04/13 1300  BP: 151/83  Pulse: 76  Temp: 98.8 F (37.1 C)  Resp: 18   General: No acute respiratory distress  Lungs: Clear to auscultation bilaterally without wheezes or crackles  Cardiovascular: Regular rate and rhythm without murmur gallop or rub normal S1 and S2  Abdomen: + tender, nondistended, soft, bowel sounds positive, no rebound, no ascites, no appreciable mass  Extremities: No significant cyanosis, clubbing, or edema bilateral lower extremities   Discharge Instructions You were cared for by a hospitalist during your hospital stay. If you have any questions about your discharge medications or the care you received while you were in the hospital after you are discharged, you can call the unit and asked to speak with the hospitalist on call if the hospitalist that took care of you is not available. Once you are discharged, your primary care physician will handle any further medical issues. Please note that NO REFILLS for any discharge medications will be authorized once you are discharged, as it is imperative that you return to your primary care physician (or establish a relationship with a primary care physician if you do not have one) for your aftercare needs so that they can reassess your need for medications and monitor your lab values.      Discharge Orders   Future Appointments Provider Department Dept Phone  05/24/2013 10:00 AM Janith Lima, MD [place] [phone]   Future Orders Complete By Expires   Diet - low sodium heart healthy  As directed    Diet - low sodium heart healthy  As directed    Discharge instructions  As directed    Increase activity slowly  As directed    Increase activity slowly  As directed        Medication List    STOP taking these medications       losartan 100 MG tablet  Commonly known as:  COZAAR      TAKE these medications        allopurinol 300 MG tablet  Commonly known as:  ZYLOPRIM  Take 300 mg by mouth daily.     amLODipine 5 MG tablet  Commonly known as:  NORVASC  Take 2 tablets (10 mg total) by mouth daily.     amoxicillin-clavulanate 875-125 MG per tablet  Commonly known as:  AUGMENTIN  Take 1 tablet by mouth 2 (two) times daily.     aspirin EC 81 MG tablet  Take 81 mg by mouth daily.     cyanocobalamin 1000 MCG/ML injection  Commonly known as:  (VITAMIN B-12)  Inject 1,000 mcg into the muscle every 30 (thirty) days.     insulin lispro protamine-lispro (75-25) 100 UNIT/ML Susp injection  Commonly known as:  HUMALOG 75/25 MIX  Inject 40 Units into the skin 2 (two) times daily with a meal.     levETIRAcetam 500 MG tablet  Commonly known as:  KEPPRA  Take 500 mg by mouth 2 (two) times daily.     metoprolol 50 MG tablet  Commonly known as:  LOPRESSOR  Take 50 mg by mouth 2 (two) times daily.     nitroGLYCERIN 0.4 MG SL tablet  Commonly known as:  NITROSTAT  Place 0.4 mg under the tongue every 5 (five) minutes as needed. x3 doses as needed for chest pain     simvastatin 20 MG tablet  Commonly known as:  ZOCOR  Take 20 mg by mouth at bedtime.       Allergies  Allergen Reactions  . Amlodipine Other (See Comments)    headache   Follow-up Information   Follow up with NEWMAN,DAVID H, MD. Schedule an appointment as soon as possible for a visit in 1 week.   Specialty:  General Surgery   Contact information:   [address] [place] [address] [phone]       Call [facility] H, MD. (as needed for complications of surgery)    Specialty:  General Surgery   Contact information:   [address] [place] [address] [phone]       Schedule an appointment as soon as possible for a visit with [name], MD.   Specialty:  Internal Medicine   Contact information:   [address] [place] [address] [phone]        The  results of significant diagnostics from this hospitalization (including imaging, microbiology, ancillary and laboratory) are listed below for reference.    Significant Diagnostic Studies: Ct Abdomen Pelvis Wo Contrast  04/30/2013   CLINICAL DATA:  Hematuria, right upper quadrant pain, elevated WBC  EXAM: CT ABDOMEN AND PELVIS WITHOUT CONTRAST  TECHNIQUE: Multidetector CT imaging of the abdomen and pelvis was performed following the standard protocol without intravenous contrast.  COMPARISON:  03/25/2008  FINDINGS: There is is patchy  infiltrate with air bronchogram right base posteriorly.  The study is limited without IV contrast. Unenhanced liver shows no biliary ductal dilatation. A distended gallbladder is noted. There is thickening of gallbladder wall up to 6 mm. Tiny layering gallstones are noted within gallbladder. Stranding of pericholecystic fat. Findings are consistent with acute cholecystitis. Clinical correlation is necessary. There is fatty replaced pancreas. The adrenal glands are unremarkable. Spleen is unremarkable. Unenhanced kidneys shows mild cortical thinning. Mild atrophic right kidney. A cyst in midpole of the right kidney measures 3.6 cm. Atherosclerotic calcifications of abdominal aorta and iliac arteries. No nephrolithiasis. No calcified ureteral calculi. No pericecal inflammation. Normal appendix. A distended urinary bladder is noted. . Radiation seeds are noted prostate gland region. Some stool noted within rectum. No inguinal adenopathy. No destructive bony lesions are noted within pelvis.  Sagittal images of the spine are unremarkable.  IMPRESSION: 1. Distended gallbladder with abnormal thickening of the wall and pericholecystic stranding consistent with acute cholecystitis. 2. Patchy infiltrate with air bronchogram right base posteriorly suspicious for pneumonia. 3. No hydronephrosis or hydroureter.  Right renal cyst is noted. 4. Radiation seeds are noted prostate gland region. These  results were called by telephone at the time of interpretation on 04/30/2013 at 2:50 PM to Dr. Blanchie Dessert , who verbally acknowledged these results.   Electronically Signed   By: Lahoma Crocker M.D.   On: 04/30/2013 14:50   Dg Abd Acute W/chest  04/30/2013   CLINICAL DATA:  Abdominal pain, rule out obstruction  EXAM: ACUTE ABDOMEN SERIES (ABDOMEN 2 VIEW & CHEST 1 VIEW)  COMPARISON:  Abdomen, 05/23/2010  FINDINGS: Mild bibasilar atelectasis. Negative for heart failure. Mild cardiac enlargement with coronary artery stent. Negative for pneumonia.  Prostate seeds. Negative for bowel obstruction. No free air. Negative for ileus. No acute bony abnormality.  IMPRESSION: Mild bibasilar atelectasis. No acute abnormality in the chest or abdomen.   Electronically Signed   By: Franchot Gallo M.D.   On: 04/30/2013 12:31   Dg C-arm 1-60 Min-no Report  05/01/2013   CLINICAL DATA: IOC   C-ARM 1-60 MINUTES  Fluoroscopy was utilized by the requesting physician.  No radiographic  interpretation.     Microbiology: Recent Results (from the past 240 hour(s))  URINE CULTURE     Status: None   Collection Time    04/30/13 11:44 AM      Result Value Ref Range Status   Colony Count 15,000 COLONIES/ML   Final   Organism ID, Bacteria Multiple bacterial morphotypes present, none   Final   Organism ID, Bacteria predominant. Suggest appropriate recollection if    Final   Organism ID, Bacteria clinically indicated.   Final  SURGICAL PCR SCREEN     Status: None   Collection Time    05/01/13  2:59 AM      Result Value Ref Range Status   MRSA, PCR NEGATIVE  NEGATIVE Final   Staphylococcus aureus NEGATIVE  NEGATIVE Final   Comment:            The Xpert SA Assay (FDA     approved for NASAL specimens     in patients over 55 years of age),     is one component of     a comprehensive surveillance     program.  Test performance has     been validated by [employer] for patients greater     than or equal to 33 year old.  It is not intended     to diagnose infection nor to     guide or monitor treatment.     Labs: Basic Metabolic Panel:  Recent Labs Lab 04/30/13 1130 05/01/13 0437 05/01/13 0926 05/02/13 0527 05/03/13 0405 05/04/13 0410  NA 140 140 144 141 144 144  K 4.5 5.6* 4.6 4.7 4.3 4.1  CL 101 107  --  109 111 111  CO2 21 20  --  21 21 20   GLUCOSE 364* 288* 286* 229* 92 87  BUN 50* 39*  --  30* 29* 27*  CREATININE 2.52* 2.07*  --  2.10* 2.09* 1.98*  CALCIUM 9.9 9.3  --  8.9 9.0 8.9   Liver Function Tests:  Recent Labs Lab 04/30/13 1130 05/01/13 0437  AST 16 119*  ALT 11 76*  ALKPHOS 108 164*  BILITOT 0.8 1.0  PROT 7.9 6.9  ALBUMIN 3.1* 2.4*    Recent Labs Lab 04/30/13 1130  LIPASE 12   No results found for this basename: AMMONIA,  in the last 168 hours CBC:  Recent Labs Lab 04/30/13 1130 05/01/13 0437 05/01/13 0926  WBC 16.3* 11.5*  --   NEUTROABS 13.4*  --   --   HGB 12.8* 12.9* 13.6  HCT 39.1 39.2 40.0  MCV 89.7 90.5  --   PLT 143* 135*  --    Cardiac Enzymes: No results found for this basename: CKTOTAL, CKMB, CKMBINDEX, TROPONINI,  in the last 168 hours BNP: BNP (last 3 results) No results found for this basename: PROBNP,  in the last 8760 hours CBG:  Recent Labs Lab 05/03/13 1643 05/03/13 2307 05/04/13 0505 05/04/13 0723 05/04/13 1136  GLUCAP 102* 73 80 103* 106*       Signed:  Rashunda Passon  [facility] 05/04/2013, 3:50 PM

## 2013-05-04 NOTE — Progress Notes (Signed)
Hypoglycemic Event  CBG: 52  Treatment: 15 GM carbohydrate snack  Symptoms: None  Follow-up CBG: Time:1710, 1725 CBG Result:67, 79  Possible Reasons for Event: Inadequate meal intake  Comments/MD notified:MD paged.  Dr. Wynelle Cleveland stated that is was ok for pt to discharge today. Dr. Wynelle Cleveland told nurse to have pt cut back the amount of insulin he is taking at home until he is eating better.  Pt and family aware.    Robert Clayton  Remember to initiate Hypoglycemia Order Set & complete

## 2013-05-04 NOTE — Progress Notes (Signed)
[place]

## 2013-05-04 NOTE — Progress Notes (Signed)
3 Days Post-Op  Subjective: He has breakfast but doesn't really want to eat.  He got out of bed and walked with a walker.  He does not have one at home and he cannot walk out of house before surgery.  Objective: Vital signs in last 24 hours: Temp:  [98.4 F (36.9 C)-100.4 F (38 C)] 98.4 F (36.9 C) (04/28 0554) Pulse Rate:  [80-90] 80 (04/28 0554) Resp:  [18] 18 (04/28 0554) BP: (171-193)/(84-100) 171/84 mmHg (04/28 0554) SpO2:  [91 %-94 %] 94 % (04/28 0554) Last BM Date: 04/29/13 Nothing PO recorded.  No BM recorded. Diet: Caridiac-carb modified. 20 ml from the drain Low grade temp last PM, no temp recorded since 3AM Some BP elevation Creatinine is stable, No CBC Started Augmentin yesterday, needs 7 days of anitbiotics Intake/Output from previous day: 04/27 0701 - 04/28 0700 In: 750 [I.V.:750] Out: 445 [Urine:425; Drains:20] Intake/Output this shift:    General appearance: alert, cooperative, no distress and he isn't hungry and has not been out of bed since yesterday. GI: soft sore, port sites look fine.  I took out the drain, few CC's in drain was clear. serous.  Lab Results:   Recent Labs  05/01/13 0926  HGB 13.6  HCT 40.0    BMET  Recent Labs  05/03/13 0405 05/04/13 0410  NA 144 144  K 4.3 4.1  CL 111 111  CO2 21 20  GLUCOSE 92 87  BUN 29* 27*  CREATININE 2.09* 1.98*  CALCIUM 9.0 8.9   PT/INR No results found for this basename: LABPROT, INR,  in the last 72 hours   Recent Labs Lab 04/30/13 1130 05/01/13 0437  AST 16 119*  ALT 11 76*  ALKPHOS 108 164*  BILITOT 0.8 1.0  PROT 7.9 6.9  ALBUMIN 3.1* 2.4*     Lipase     Component Value Date/Time   LIPASE 12 04/30/2013 1130     Studies/Results: No results found.  Medications: . allopurinol  300 mg Oral Daily  . amoxicillin-clavulanate  1 tablet Oral Q12H  . enoxaparin (LOVENOX) injection  40 mg Subcutaneous Q24H  . feeding supplement (GLUCERNA SHAKE)  237 mL Oral TID BM  . insulin  aspart  0-15 Units Subcutaneous TID WC  . insulin aspart protamine- aspart  28 Units Subcutaneous BID WC  . levETIRAcetam  500 mg Oral BID  . metoprolol  50 mg Oral BID    Assessment/Plan 1. POD 3, s/p lap chole for gangrenous cholecystitis, Dr. Lucia Gaskins 2. CKD 3.  Gout 4.  AODM 5.  Hypertension   Plan:  He is OK to go from a surgical standpoint, making slow progress.  He is debilitated at base line and so far has not eaten or been out of bed.  He lives with his wife.  I think he needs to walk and be able to eat prior to going home.  I have discussed this with his nurse. He also needs a total of 7 days of antibiotics. Today is day 5/7.    LOS: 4 days    Earnstine Regal 05/04/2013

## 2013-05-04 NOTE — Care Management Note (Signed)
    Page 1 of 1   05/04/2013     4:08:25 PM CARE MANAGEMENT NOTE 05/04/2013  Patient:  Robert Clayton, Robert Clayton   Account Number:  [id number]  Date Initiated:  05/04/2013  Documentation initiated by:  Sunday Spillers  Subjective/Objective Assessment:   80 yo male admitted with cholecystitis. PTA lived at home with spouse.     Action/Plan:   Home when stable   Anticipated DC Date:  05/04/2013   Anticipated DC Plan:  [place]  CM consult      Choice offered to / List presented to:             Status of service:  Completed, signed off Medicare Important Message given?   (If response is "NO", the following Medicare IM given date fields will be blank) Date Medicare IM given:   Date Additional Medicare IM given:    Discharge Disposition:  HOME/SELF CARE  Per UR Regulation:  Reviewed for med. necessity/level of care/duration of stay  If discussed at [place] of Stay Meetings, dates discussed:    Comments:

## 2013-05-05 ENCOUNTER — Telehealth: Payer: Self-pay

## 2013-05-12 NOTE — Telephone Encounter (Signed)
Error

## 2013-05-13 ENCOUNTER — Other Ambulatory Visit (INDEPENDENT_AMBULATORY_CARE_PROVIDER_SITE_OTHER): Payer: Medicare Other

## 2013-05-13 ENCOUNTER — Ambulatory Visit (INDEPENDENT_AMBULATORY_CARE_PROVIDER_SITE_OTHER): Payer: Medicare Other | Admitting: Internal Medicine

## 2013-05-13 ENCOUNTER — Encounter: Payer: Self-pay | Admitting: Internal Medicine

## 2013-05-13 VITALS — BP 124/72 | HR 78 | Temp 97.5°F | Resp 16 | Wt 197.2 lb

## 2013-05-13 DIAGNOSIS — IMO0002 Reserved for concepts with insufficient information to code with codable children: Secondary | ICD-10-CM

## 2013-05-13 DIAGNOSIS — I129 Hypertensive chronic kidney disease with stage 1 through stage 4 chronic kidney disease, or unspecified chronic kidney disease: Secondary | ICD-10-CM

## 2013-05-13 DIAGNOSIS — N179 Acute kidney failure, unspecified: Secondary | ICD-10-CM

## 2013-05-13 DIAGNOSIS — E1159 Type 2 diabetes mellitus with other circulatory complications: Secondary | ICD-10-CM | POA: Diagnosis not present

## 2013-05-13 DIAGNOSIS — E1151 Type 2 diabetes mellitus with diabetic peripheral angiopathy without gangrene: Secondary | ICD-10-CM

## 2013-05-13 DIAGNOSIS — E1165 Type 2 diabetes mellitus with hyperglycemia: Secondary | ICD-10-CM

## 2013-05-13 DIAGNOSIS — I2510 Atherosclerotic heart disease of native coronary artery without angina pectoris: Secondary | ICD-10-CM | POA: Diagnosis not present

## 2013-05-13 DIAGNOSIS — N189 Chronic kidney disease, unspecified: Secondary | ICD-10-CM

## 2013-05-13 DIAGNOSIS — D518 Other vitamin B12 deficiency anemias: Secondary | ICD-10-CM

## 2013-05-13 LAB — BASIC METABOLIC PANEL
BUN: 21 mg/dL (ref 6–23)
CHLORIDE: 107 meq/L (ref 96–112)
CO2: 27 mEq/L (ref 19–32)
Calcium: 9.2 mg/dL (ref 8.4–10.5)
Creatinine, Ser: 2.2 mg/dL — ABNORMAL HIGH (ref 0.4–1.5)
GFR: 36.99 mL/min — ABNORMAL LOW (ref >60.00)
Glucose, Bld: 169 mg/dL — ABNORMAL HIGH (ref 70–99)
POTASSIUM: 4.6 meq/L (ref 3.5–5.1)
SODIUM: 141 meq/L (ref 135–145)

## 2013-05-13 LAB — CBC WITH DIFFERENTIAL/PLATELET
Basophils Absolute: 0 10*3/uL (ref 0.0–0.1)
Basophils Relative: 0.3 % (ref 0.0–3.0)
EOS ABS: 0.1 10*3/uL (ref 0.0–0.7)
Eosinophils Relative: 0.9 % (ref 0.0–5.0)
HCT: 38.7 % — ABNORMAL LOW (ref 39.0–52.0)
Hemoglobin: 12.7 g/dL — ABNORMAL LOW (ref 13.0–17.0)
Lymphocytes Relative: 15.3 % (ref 12.0–46.0)
Lymphs Abs: 2.1 10*3/uL (ref 0.7–4.0)
MCHC: 32.7 g/dL (ref 30.0–36.0)
MCV: 91.6 fl (ref 78.0–100.0)
Monocytes Absolute: 0.7 10*3/uL (ref 0.1–1.0)
Monocytes Relative: 5.3 % (ref 3.0–12.0)
NEUTROS PCT: 78.2 % — AB (ref 43.0–77.0)
Neutro Abs: 10.8 10*3/uL — ABNORMAL HIGH (ref 1.4–7.7)
Platelets: 222 10*3/uL (ref 150.0–400.0)
RBC: 4.23 Mil/uL (ref 4.22–5.81)
RDW: 15.1 % (ref 11.5–15.5)
WBC: 13.8 10*3/uL — ABNORMAL HIGH (ref 4.0–10.5)

## 2013-05-13 MED ORDER — CYANOCOBALAMIN 1000 MCG/ML IJ SOLN
1000.0000 ug | Freq: Once | INTRAMUSCULAR | Status: AC
  Administered 2013-05-13: 1000 ug via INTRAMUSCULAR

## 2013-05-13 NOTE — Progress Notes (Signed)
   Subjective:    Patient ID: Robert Clayton, male    DOB: [birth year], 80 y.o.   MRN: [id number]  HPI Comments: He is s/p cholecystectomy for acute cholecystitis and is doing well with no pain. He developed some renal insufficiency and needs to have labs done today.     Review of Systems  Constitutional: Negative.  Negative for fever, chills, diaphoresis, appetite change and fatigue.  HENT: Negative.   Eyes: Negative.   Respiratory: Negative.  Negative for cough, choking, chest tightness, shortness of breath, wheezing and stridor.   Cardiovascular: Negative.  Negative for chest pain, palpitations and leg swelling.  Gastrointestinal: Negative.  Negative for nausea, vomiting, abdominal pain, diarrhea, constipation and blood in stool.  Endocrine: Negative.   Genitourinary: Negative.   Musculoskeletal: Negative.   Skin: Negative.   Allergic/Immunologic: Negative.   Neurological: Negative.   Hematological: Negative.  Negative for adenopathy. Does not bruise/bleed easily.  Psychiatric/Behavioral: Negative.        Objective:   Physical Exam  Vitals reviewed. Constitutional: He is oriented to person, place, and time. He appears well-developed and well-nourished.  Non-toxic appearance. He does not have a sickly appearance. He does not appear ill. No distress.  HENT:  Head: Normocephalic and atraumatic.  Mouth/Throat: Oropharynx is clear and moist. No oropharyngeal exudate.  Eyes: Conjunctivae are normal. Right eye exhibits no discharge. Left eye exhibits no discharge. No scleral icterus.  Neck: Normal range of motion. Neck supple. No JVD present. No tracheal deviation present. No thyromegaly present.  Cardiovascular: Normal rate, regular rhythm, normal heart sounds and intact distal pulses.  Exam reveals no gallop and no friction rub.   No murmur heard. Pulmonary/Chest: Effort normal and breath sounds normal. No stridor. No respiratory distress. He has no wheezes. He has no rales. He  exhibits no tenderness.  Abdominal: Soft. Normal appearance and bowel sounds are normal. He exhibits no distension and no mass. There is no hepatosplenomegaly, splenomegaly or hepatomegaly. There is no tenderness. There is no rigidity, no rebound, no guarding, no CVA tenderness, no tenderness at McBurney's point and negative Murphy's sign. No hernia. Hernia confirmed negative in the ventral area and confirmed negative in the left inguinal area.    Musculoskeletal: Normal range of motion. He exhibits no edema and no tenderness.  Lymphadenopathy:    He has no cervical adenopathy.  Neurological: He is oriented to person, place, and time.  Skin: Skin is warm and dry. No rash noted. He is not diaphoretic. No erythema. No pallor.     Lab Results  Component Value Date   WBC 11.5* 05/01/2013   HGB 13.6 05/01/2013   HCT 40.0 05/01/2013   PLT 135* 05/01/2013   GLUCOSE 87 05/04/2013   CHOL 137 06/08/2012   TRIG 147.0 06/08/2012   HDL 32.60* 06/08/2012   LDLDIRECT 80.6 06/26/2010   LDLCALC 75 06/08/2012   ALT 76* 05/01/2013   AST 119* 05/01/2013   NA 144 05/04/2013   K 4.1 05/04/2013   CL 111 05/04/2013   CREATININE 1.98* 05/04/2013   BUN 27* 05/04/2013   CO2 20 05/04/2013   TSH 1.70 06/08/2012   PSA 0.03* 06/29/2009   INR 1.1 04/08/2008   HGBA1C 9.2* 04/30/2013   MICROALBUR 1.7 03/23/2008       Assessment & Plan:

## 2013-05-13 NOTE — Progress Notes (Signed)
Pre visit review using our clinic review tool, if applicable. No additional management support is needed unless otherwise documented below in the visit note. 

## 2013-05-13 NOTE — Patient Instructions (Signed)

## 2013-05-14 NOTE — Assessment & Plan Note (Signed)
His blood sugars have improved some Will not add nay meds at this time in light of his renal function but have asked him to use the insulin as directed

## 2013-05-14 NOTE — Assessment & Plan Note (Signed)
His renal function is stable His BP is well controlled so will not add an acei or arb at this time

## 2013-05-14 NOTE — Assessment & Plan Note (Signed)
His BP is well controlled 

## 2013-05-20 ENCOUNTER — Ambulatory Visit (INDEPENDENT_AMBULATORY_CARE_PROVIDER_SITE_OTHER): Payer: Medicare Other | Admitting: Surgery

## 2013-05-20 ENCOUNTER — Encounter (INDEPENDENT_AMBULATORY_CARE_PROVIDER_SITE_OTHER): Payer: Self-pay | Admitting: Surgery

## 2013-05-20 VITALS — BP 122/70 | HR 76 | Temp 98.4°F | Resp 16 | Ht 71.0 in | Wt 195.0 lb

## 2013-05-20 DIAGNOSIS — Z9049 Acquired absence of other specified parts of digestive tract: Secondary | ICD-10-CM | POA: Insufficient documentation

## 2013-05-20 DIAGNOSIS — Z9889 Other specified postprocedural states: Secondary | ICD-10-CM
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
## 2013-05-24 ENCOUNTER — Ambulatory Visit: Payer: Medicare Other | Admitting: Internal Medicine

## 2013-06-09 DIAGNOSIS — M7150 Other bursitis, not elsewhere classified, unspecified site: Secondary | ICD-10-CM | POA: Diagnosis not present

## 2013-06-09 DIAGNOSIS — M79609 Pain in unspecified limb: Secondary | ICD-10-CM | POA: Diagnosis not present

## 2013-06-09 DIAGNOSIS — M722 Plantar fascial fibromatosis: Secondary | ICD-10-CM | POA: Diagnosis not present

## 2013-06-14 ENCOUNTER — Ambulatory Visit (INDEPENDENT_AMBULATORY_CARE_PROVIDER_SITE_OTHER): Payer: Medicare Other | Admitting: Endocrinology

## 2013-06-14 ENCOUNTER — Encounter: Payer: Self-pay | Admitting: Endocrinology

## 2013-06-14 VITALS — BP 140/82 | HR 75 | Temp 98.2°F | Ht 71.0 in | Wt 201.0 lb

## 2013-06-14 DIAGNOSIS — E1159 Type 2 diabetes mellitus with other circulatory complications: Secondary | ICD-10-CM

## 2013-06-14 DIAGNOSIS — D518 Other vitamin B12 deficiency anemias: Secondary | ICD-10-CM

## 2013-06-14 DIAGNOSIS — I2510 Atherosclerotic heart disease of native coronary artery without angina pectoris: Secondary | ICD-10-CM | POA: Diagnosis not present

## 2013-06-14 DIAGNOSIS — IMO0002 Reserved for concepts with insufficient information to code with codable children: Secondary | ICD-10-CM

## 2013-06-14 DIAGNOSIS — E1165 Type 2 diabetes mellitus with hyperglycemia: Secondary | ICD-10-CM

## 2013-06-14 DIAGNOSIS — E1151 Type 2 diabetes mellitus with diabetic peripheral angiopathy without gangrene: Secondary | ICD-10-CM

## 2013-06-14 MED ORDER — CYANOCOBALAMIN 1000 MCG/ML IJ SOLN
1000.0000 ug | Freq: Once | INTRAMUSCULAR | Status: AC
  Administered 2013-06-14: 1000 ug via INTRAMUSCULAR

## 2013-06-14 NOTE — Patient Instructions (Addendum)
check your blood sugar twice a day.  vary the time of day when you check, between before the 3 meals, and at bedtime.  also check if you have symptoms of your blood sugar being too high or too low.  please keep a record of the readings and bring it to your next appointment here.  please call us sooner if your blood sugar goes below 70, or if you have a lot of readings over 200.   Please come back for a follow-up appointment in 3 months.   Please increase the insulin to 90 units with breakfast, and 40 with the evening meal.   On this type of insulin schedule, you should eat meals on a regular schedule.  If a meal is missed or significantly delayed, your blood sugar could go low.
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
## 2013-06-15 ENCOUNTER — Ambulatory Visit: Payer: Medicare Other

## 2013-06-22 ENCOUNTER — Telehealth: Payer: Self-pay | Admitting: Internal Medicine

## 2013-06-22 DIAGNOSIS — IMO0002 Reserved for concepts with insufficient information to code with codable children: Secondary | ICD-10-CM

## 2013-06-22 DIAGNOSIS — E1165 Type 2 diabetes mellitus with hyperglycemia: Principal | ICD-10-CM

## 2013-06-22 DIAGNOSIS — E1151 Type 2 diabetes mellitus with diabetic peripheral angiopathy without gangrene: Secondary | ICD-10-CM

## 2013-06-22 NOTE — Telephone Encounter (Signed)
Referral ordered

## 2013-06-22 NOTE — Telephone Encounter (Signed)
Pt's wife is aware that [facility] will call with an appt.

## 2013-06-22 NOTE — Telephone Encounter (Signed)
Pt needs a referral to an eye doctor for a check up.  He hasn't had a check up in several years and wants to see if he needs new glasses.

## 2013-07-14 ENCOUNTER — Encounter: Payer: Self-pay | Admitting: Internal Medicine

## 2013-07-16 ENCOUNTER — Telehealth: Payer: Self-pay | Admitting: Endocrinology

## 2013-07-16 MED ORDER — GLUCOSE BLOOD VI STRP: ORAL_STRIP | Status: DC

## 2013-07-16 NOTE — Telephone Encounter (Signed)
rx sent to pharmacy. Pt notified  

## 2013-07-16 NOTE — Telephone Encounter (Signed)
Pt needs one touch test strips called into [facility] asap please

## 2013-07-19 ENCOUNTER — Ambulatory Visit (INDEPENDENT_AMBULATORY_CARE_PROVIDER_SITE_OTHER): Payer: Medicare Other

## 2013-07-19 DIAGNOSIS — D518 Other vitamin B12 deficiency anemias: Secondary | ICD-10-CM | POA: Diagnosis not present

## 2013-07-19 MED ORDER — CYANOCOBALAMIN 1000 MCG/ML IJ SOLN
1000.0000 ug | Freq: Once | INTRAMUSCULAR | Status: AC
  Administered 2013-07-19: 1000 ug via INTRAMUSCULAR

## 2013-07-30 ENCOUNTER — Other Ambulatory Visit: Payer: Self-pay | Admitting: Internal Medicine

## 2013-08-12 ENCOUNTER — Encounter: Payer: Self-pay | Admitting: Family Medicine

## 2013-08-12 ENCOUNTER — Ambulatory Visit (INDEPENDENT_AMBULATORY_CARE_PROVIDER_SITE_OTHER): Payer: Medicare Other | Admitting: Family Medicine

## 2013-08-12 ENCOUNTER — Telehealth: Payer: Self-pay | Admitting: Internal Medicine

## 2013-08-12 VITALS — BP 153/70 | HR 65 | Temp 98.6°F | Ht 71.0 in | Wt 208.0 lb

## 2013-08-12 DIAGNOSIS — E538 Deficiency of other specified B group vitamins: Secondary | ICD-10-CM

## 2013-08-12 DIAGNOSIS — I2510 Atherosclerotic heart disease of native coronary artery without angina pectoris: Secondary | ICD-10-CM | POA: Diagnosis not present

## 2013-08-12 DIAGNOSIS — IMO0002 Reserved for concepts with insufficient information to code with codable children: Secondary | ICD-10-CM | POA: Diagnosis not present

## 2013-08-12 DIAGNOSIS — L03114 Cellulitis of left upper limb: Secondary | ICD-10-CM

## 2013-08-12 MED ORDER — CYANOCOBALAMIN 1000 MCG/ML IJ SOLN
1000.0000 ug | Freq: Once | INTRAMUSCULAR | Status: AC
Start: 2013-08-12 — End: 2013-08-12
  Administered 2013-08-12: 1000 ug via INTRAMUSCULAR

## 2013-08-12 MED ORDER — CEPHALEXIN 500 MG PO CAPS: 500.0000 mg | ORAL_CAPSULE | Freq: Three times a day (TID) | ORAL | Status: AC

## 2013-08-12 NOTE — Progress Notes (Signed)
Pre visit review using our clinic review tool, if applicable. No additional management support is needed unless otherwise documented below in the visit note. 

## 2013-08-12 NOTE — Addendum Note (Signed)
Addended by: Aggie Hacker A on: 08/12/2013 12:49 PM   Modules accepted: Orders

## 2013-08-12 NOTE — Progress Notes (Signed)
   Subjective:    Patient ID: Robert Clayton, male    DOB: Jan 24, 1930, 80 y.o.   MRN: [id number]  HPI Here for a bee sting to the left hand which occurred yesterday morning. The hand has been swollen and painful. No tongue swelling or SOB. He has applied ice packs a few times.    Review of Systems  Constitutional: Negative.   Respiratory: Negative.   Cardiovascular: Negative.   Skin: Positive for color change.       Objective:   Physical Exam  Constitutional: He appears well-developed and well-nourished. No distress.  Cardiovascular: Normal rate, regular rhythm, normal heart sounds and intact distal pulses.   Pulmonary/Chest: Effort normal and breath sounds normal.  Musculoskeletal:  The left hand and wrist are red, warm, swollen, and tender. Full ROM           Assessment & Plan:  He will use ice packs and take Benadryl every 4 hours. Cover with Keflex. Recheck prn

## 2013-08-12 NOTE — Telephone Encounter (Signed)
Pt would like to start coming to [facility] and would like to know if you will accept him as a pt?

## 2013-08-13 NOTE — Telephone Encounter (Signed)
See if he is willing to see one of the new providers.

## 2013-08-18 NOTE — Telephone Encounter (Signed)
Phone is busy signal

## 2013-08-19 ENCOUNTER — Ambulatory Visit: Payer: Medicare Other

## 2013-08-30 ENCOUNTER — Other Ambulatory Visit: Payer: Self-pay | Admitting: Endocrinology

## 2013-09-11 ENCOUNTER — Encounter: Payer: Self-pay | Admitting: Gastroenterology

## 2013-09-14 ENCOUNTER — Ambulatory Visit: Payer: Medicare Other | Admitting: Endocrinology

## 2013-09-21 ENCOUNTER — Other Ambulatory Visit (INDEPENDENT_AMBULATORY_CARE_PROVIDER_SITE_OTHER): Payer: Medicare Other

## 2013-09-21 ENCOUNTER — Ambulatory Visit (INDEPENDENT_AMBULATORY_CARE_PROVIDER_SITE_OTHER): Payer: Medicare Other | Admitting: Internal Medicine

## 2013-09-21 ENCOUNTER — Encounter: Payer: Self-pay | Admitting: Internal Medicine

## 2013-09-21 VITALS — BP 160/96 | HR 78 | Temp 98.2°F | Resp 16 | Ht 71.0 in | Wt 212.2 lb

## 2013-09-21 DIAGNOSIS — IMO0002 Reserved for concepts with insufficient information to code with codable children: Secondary | ICD-10-CM

## 2013-09-21 DIAGNOSIS — E1159 Type 2 diabetes mellitus with other circulatory complications: Secondary | ICD-10-CM

## 2013-09-21 DIAGNOSIS — E785 Hyperlipidemia, unspecified: Secondary | ICD-10-CM | POA: Diagnosis not present

## 2013-09-21 DIAGNOSIS — E1165 Type 2 diabetes mellitus with hyperglycemia: Secondary | ICD-10-CM

## 2013-09-21 DIAGNOSIS — D518 Other vitamin B12 deficiency anemias: Secondary | ICD-10-CM

## 2013-09-21 DIAGNOSIS — B351 Tinea unguium: Secondary | ICD-10-CM

## 2013-09-21 DIAGNOSIS — E538 Deficiency of other specified B group vitamins: Secondary | ICD-10-CM | POA: Diagnosis not present

## 2013-09-21 DIAGNOSIS — I798 Other disorders of arteries, arterioles and capillaries in diseases classified elsewhere: Secondary | ICD-10-CM

## 2013-09-21 DIAGNOSIS — I2510 Atherosclerotic heart disease of native coronary artery without angina pectoris: Secondary | ICD-10-CM | POA: Diagnosis not present

## 2013-09-21 DIAGNOSIS — Z23 Encounter for immunization: Secondary | ICD-10-CM

## 2013-09-21 DIAGNOSIS — I129 Hypertensive chronic kidney disease with stage 1 through stage 4 chronic kidney disease, or unspecified chronic kidney disease: Secondary | ICD-10-CM | POA: Diagnosis not present

## 2013-09-21 DIAGNOSIS — E1151 Type 2 diabetes mellitus with diabetic peripheral angiopathy without gangrene: Secondary | ICD-10-CM

## 2013-09-21 DIAGNOSIS — M109 Gout, unspecified: Secondary | ICD-10-CM

## 2013-09-21 LAB — COMPREHENSIVE METABOLIC PANEL
ALK PHOS: 66 U/L (ref 39–117)
ALT: 17 U/L (ref 0–53)
AST: 18 U/L (ref 0–37)
Albumin: 3.8 g/dL (ref 3.5–5.2)
BILIRUBIN TOTAL: 0.3 mg/dL (ref 0.2–1.2)
BUN: 26 mg/dL — AB (ref 6–23)
CO2: 28 mEq/L (ref 19–32)
CREATININE: 2 mg/dL — AB (ref 0.4–1.5)
Calcium: 9.3 mg/dL (ref 8.4–10.5)
Chloride: 107 mEq/L (ref 96–112)
GFR: 42.7 mL/min — ABNORMAL LOW (ref >60.00)
Glucose, Bld: 160 mg/dL — ABNORMAL HIGH (ref 70–99)
Potassium: 4.3 mEq/L (ref 3.5–5.1)
Sodium: 142 mEq/L (ref 135–145)
Total Protein: 7.4 g/dL (ref 6.0–8.3)

## 2013-09-21 LAB — LIPID PANEL
CHOL/HDL RATIO: 5
Cholesterol: 136 mg/dL (ref 0–200)
HDL: 30.2 mg/dL — ABNORMAL LOW (ref >39.00)
LDL CALC: 80 mg/dL (ref 0–99)
NonHDL: 105.8
TRIGLYCERIDES: 128 mg/dL (ref 0.0–149.0)
VLDL: 25.6 mg/dL (ref 0.0–40.0)

## 2013-09-21 LAB — HEMOGLOBIN A1C: Hgb A1c MFr Bld: 8.7 % — ABNORMAL HIGH (ref 4.6–6.5)

## 2013-09-21 LAB — TSH: TSH: 1.21 u[IU]/mL (ref 0.35–4.50)

## 2013-09-21 MED ORDER — AZILSARTAN MEDOXOMIL 40 MG PO TABS: 1.0000 | ORAL_TABLET | Freq: Every day | ORAL | Status: DC

## 2013-09-21 MED ORDER — CYANOCOBALAMIN 1000 MCG/ML IJ SOLN
1000.0000 ug | Freq: Once | INTRAMUSCULAR | Status: AC
Start: 2013-09-21 — End: 2013-09-21
  Administered 2013-09-21: 1000 ug via INTRAMUSCULAR

## 2013-09-21 NOTE — Progress Notes (Signed)
Subjective:    Patient ID: Robert Clayton, male    DOB: [birth year], 80 y.o.   MRN: [id number]  Hypertension This is a chronic problem. The current episode started more than 1 year ago. The problem has been gradually worsening since onset. The problem is uncontrolled. Pertinent negatives include no anxiety, blurred vision, chest pain, headaches, malaise/fatigue, neck pain, orthopnea, palpitations, peripheral edema, PND, shortness of breath or sweats. There are no associated agents to hypertension. Past treatments include beta blockers. The current treatment provides moderate improvement. Compliance problems include diet and exercise.  Hypertensive end-organ damage includes kidney disease and CAD/MI. Identifiable causes of hypertension include chronic renal disease.      Review of Systems  Constitutional: Negative.  Negative for fever, chills, malaise/fatigue, diaphoresis, appetite change and fatigue.  HENT: Negative.   Eyes: Negative.  Negative for blurred vision.  Respiratory: Negative.  Negative for apnea, cough, choking, chest tightness, shortness of breath, wheezing and stridor.   Cardiovascular: Negative.  Negative for chest pain, palpitations, orthopnea, leg swelling and PND.  Gastrointestinal: Negative.  Negative for nausea, vomiting, abdominal pain, diarrhea, constipation and blood in stool.  Endocrine: Negative.  Negative for polydipsia, polyphagia and polyuria.  Genitourinary: Negative.   Musculoskeletal: Positive for back pain. Negative for arthralgias, joint swelling, myalgias and neck pain.  Skin: Negative.   Allergic/Immunologic: Negative.   Neurological: Negative.  Negative for dizziness and headaches.  Hematological: Negative.  Negative for adenopathy. Does not bruise/bleed easily.  Psychiatric/Behavioral: Negative.        Objective:   Physical Exam  Vitals reviewed. Constitutional: He is oriented to person, place, and time. He appears well-developed and well-nourished.  No distress.  HENT:  Head: Normocephalic and atraumatic.  Mouth/Throat: Oropharynx is clear and moist. No oropharyngeal exudate.  Eyes: Conjunctivae are normal. Right eye exhibits no discharge. Left eye exhibits no discharge. No scleral icterus.  Neck: Normal range of motion. Neck supple. No JVD present. No tracheal deviation present. No thyromegaly present.  Cardiovascular: Normal rate, regular rhythm, normal heart sounds and intact distal pulses.  Exam reveals no gallop and no friction rub.   No murmur heard. Pulmonary/Chest: Effort normal and breath sounds normal. No stridor. No respiratory distress. He has no wheezes. He has no rales. He exhibits no tenderness.  Abdominal: Soft. Bowel sounds are normal. He exhibits no distension and no mass. There is no tenderness. There is no rebound and no guarding.  Musculoskeletal: Normal range of motion. He exhibits edema (trace edema in BLE). He exhibits no tenderness.  Lymphadenopathy:    He has no cervical adenopathy.  Neurological: He is oriented to person, place, and time.  Skin: Skin is warm and dry. No rash noted. He is not diaphoretic. No erythema. No pallor.     Lab Results  Component Value Date   WBC 13.8* 05/13/2013   HGB 12.7* 05/13/2013   HCT 38.7* 05/13/2013   PLT 222.0 05/13/2013   GLUCOSE 169* 05/13/2013   CHOL 137 06/08/2012   TRIG 147.0 06/08/2012   HDL 32.60* 06/08/2012   LDLDIRECT 80.6 06/26/2010   LDLCALC 75 06/08/2012   ALT 76* 05/01/2013   AST 119* 05/01/2013   NA 141 05/13/2013   K 4.6 05/13/2013   CL 107 05/13/2013   CREATININE 2.2* 05/13/2013   BUN 21 05/13/2013   CO2 27 05/13/2013   TSH 1.70 06/08/2012   PSA 0.03* 06/29/2009   INR 1.1 04/08/2008   HGBA1C 9.2* 04/30/2013   MICROALBUR 1.7 03/23/2008  Assessment & Plan:

## 2013-09-21 NOTE — Patient Instructions (Signed)

## 2013-09-21 NOTE — Progress Notes (Signed)
Pre visit review using our clinic review tool, if applicable. No additional management support is needed unless otherwise documented below in the visit note. 

## 2013-09-22 ENCOUNTER — Encounter: Payer: Self-pay | Admitting: Internal Medicine

## 2013-09-22 ENCOUNTER — Ambulatory Visit (INDEPENDENT_AMBULATORY_CARE_PROVIDER_SITE_OTHER): Payer: Medicare Other | Admitting: Endocrinology

## 2013-09-22 ENCOUNTER — Encounter: Payer: Self-pay | Admitting: Endocrinology

## 2013-09-22 VITALS — BP 116/64 | HR 84 | Temp 97.9°F | Ht 71.0 in | Wt 213.0 lb

## 2013-09-22 DIAGNOSIS — E1159 Type 2 diabetes mellitus with other circulatory complications: Secondary | ICD-10-CM | POA: Diagnosis not present

## 2013-09-22 DIAGNOSIS — I2510 Atherosclerotic heart disease of native coronary artery without angina pectoris: Secondary | ICD-10-CM

## 2013-09-22 DIAGNOSIS — I798 Other disorders of arteries, arterioles and capillaries in diseases classified elsewhere: Secondary | ICD-10-CM | POA: Diagnosis not present

## 2013-09-22 DIAGNOSIS — E1165 Type 2 diabetes mellitus with hyperglycemia: Principal | ICD-10-CM

## 2013-09-22 DIAGNOSIS — E1151 Type 2 diabetes mellitus with diabetic peripheral angiopathy without gangrene: Secondary | ICD-10-CM

## 2013-09-22 DIAGNOSIS — IMO0002 Reserved for concepts with insufficient information to code with codable children: Secondary | ICD-10-CM

## 2013-09-22 LAB — CBC WITH DIFFERENTIAL/PLATELET
BASOS PCT: 0.3 % (ref 0.0–3.0)
Basophils Absolute: 0 10*3/uL (ref 0.0–0.1)
EOS ABS: 0.1 10*3/uL (ref 0.0–0.7)
Eosinophils Relative: 1 % (ref 0.0–5.0)
HEMATOCRIT: 42.6 % (ref 39.0–52.0)
HEMOGLOBIN: 14.1 g/dL (ref 13.0–17.0)
Lymphocytes Relative: 19.6 % (ref 12.0–46.0)
Lymphs Abs: 1.8 10*3/uL (ref 0.7–4.0)
MCHC: 33.1 g/dL (ref 30.0–36.0)
MCV: 92.1 fl (ref 78.0–100.0)
MONO ABS: 0.6 10*3/uL (ref 0.1–1.0)
Monocytes Relative: 6.6 % (ref 3.0–12.0)
Neutro Abs: 6.5 10*3/uL (ref 1.4–7.7)
Neutrophils Relative %: 72.5 % (ref 43.0–77.0)
Platelets: 103 10*3/uL — ABNORMAL LOW (ref 150.0–400.0)
RBC: 4.62 Mil/uL (ref 4.22–5.81)
RDW: 15.1 % (ref 11.5–15.5)
WBC: 9 10*3/uL (ref 4.0–10.5)

## 2013-09-22 NOTE — Assessment & Plan Note (Signed)
I will recheck his A1C today He agrees to be more compliant with his insulin regimen

## 2013-09-22 NOTE — Assessment & Plan Note (Signed)
His BP is not well controlled I have asked him to start an ARB for BP control as well as renal protection and risk reduction I will monitor his lytes and renal function today

## 2013-09-22 NOTE — Patient Instructions (Addendum)
check your blood sugar twice a day.  vary the time of day when you check, between before the 3 meals, and at bedtime.  also check if you have symptoms of your blood sugar being too high or too low.  please keep a record of the readings and bring it to your next appointment here.  please call us sooner if your blood sugar goes below 70, or if you have a lot of readings over 200.   Please come back for a follow-up appointment in January.   Please increase the insulin to 120 units with breakfast, and 30 with the evening meal.   On this type of insulin schedule, you should eat meals on a regular schedule.  If a meal is missed or significantly delayed, your blood sugar could go low.
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
## 2013-09-22 NOTE — Assessment & Plan Note (Signed)
He has achieved his LDL goal I will recheck his FLP today

## 2013-09-23 LAB — HM DIABETES EYE EXAM

## 2013-09-30 ENCOUNTER — Encounter: Payer: Self-pay | Admitting: Podiatry

## 2013-09-30 ENCOUNTER — Ambulatory Visit (INDEPENDENT_AMBULATORY_CARE_PROVIDER_SITE_OTHER): Payer: Medicare Other | Admitting: Podiatry

## 2013-09-30 VITALS — BP 169/92 | HR 60 | Resp 16

## 2013-09-30 DIAGNOSIS — B351 Tinea unguium: Secondary | ICD-10-CM | POA: Diagnosis not present

## 2013-09-30 DIAGNOSIS — I2510 Atherosclerotic heart disease of native coronary artery without angina pectoris: Secondary | ICD-10-CM | POA: Diagnosis not present

## 2013-09-30 DIAGNOSIS — M79609 Pain in unspecified limb: Secondary | ICD-10-CM

## 2013-09-30 DIAGNOSIS — Q828 Other specified congenital malformations of skin: Secondary | ICD-10-CM | POA: Diagnosis not present

## 2013-09-30 DIAGNOSIS — E1159 Type 2 diabetes mellitus with other circulatory complications: Secondary | ICD-10-CM | POA: Diagnosis not present

## 2013-09-30 DIAGNOSIS — M79673 Pain in unspecified foot: Secondary | ICD-10-CM

## 2013-09-30 NOTE — Patient Instructions (Signed)
Diabetes and Foot Care Diabetes may cause you to have problems because of poor blood supply (circulation) to your feet and legs. This may cause the skin on your feet to become thinner, break easier, and heal more slowly. Your skin may become dry, and the skin may peel and crack. You may also have nerve damage in your legs and feet causing decreased feeling in them. You may not notice minor injuries to your feet that could lead to infections or more serious problems. Taking care of your feet is one of the most important things you can do for yourself.  HOME CARE INSTRUCTIONS  Wear shoes at all times, even in the house. Do not go barefoot. Bare feet are easily injured.  Check your feet daily for blisters, cuts, and redness. If you cannot see the bottom of your feet, use a mirror or ask someone for help.  Wash your feet with warm water (do not use hot water) and mild soap. Then pat your feet and the areas between your toes until they are completely dry. Do not soak your feet as this can dry your skin.  Apply a moisturizing lotion or petroleum jelly (that does not contain alcohol and is unscented) to the skin on your feet and to dry, brittle toenails. Do not apply lotion between your toes.  Trim your toenails straight across. Do not dig under them or around the cuticle. File the edges of your nails with an emery board or nail file.  Do not cut corns or calluses or try to remove them with medicine.  Wear clean socks or stockings every day. Make sure they are not too tight. Do not wear knee-high stockings since they may decrease blood flow to your legs.  Wear shoes that fit properly and have enough cushioning. To break in new shoes, wear them for just a few hours a day. This prevents you from injuring your feet. Always look in your shoes before you put them on to be sure there are no objects inside.  Do not cross your legs. This may decrease the blood flow to your feet.  If you find a minor scrape,  cut, or break in the skin on your feet, keep it and the skin around it clean and dry. These areas may be cleansed with mild soap and water. Do not cleanse the area with peroxide, alcohol, or iodine.  When you remove an adhesive bandage, be sure not to damage the skin around it.  If you have a wound, look at it several times a day to make sure it is healing.  Do not use heating pads or hot water bottles. They may burn your skin. If you have lost feeling in your feet or legs, you may not know it is happening until it is too late.  Make sure your health care provider performs a complete foot exam at least annually or more often if you have foot problems. Report any cuts, sores, or bruises to your health care provider immediately. SEEK MEDICAL CARE IF:   You have an injury that is not healing.  You have cuts or breaks in the skin.  You have an ingrown nail.  You notice redness on your legs or feet.  You feel burning or tingling in your legs or feet.  You have pain or cramps in your legs and feet.  Your legs or feet are numb.  Your feet always feel cold. SEEK IMMEDIATE MEDICAL CARE IF:   There is increasing redness,   swelling, or pain in or around a wound.  There is a red line that goes up your leg.  Pus is coming from a wound.  You develop a fever or as directed by your health care provider.  You notice a bad smell coming from an ulcer or wound. Document Released: 12/22/1999 Document Revised: 08/26/2012 Document Reviewed: 06/02/2012 Exit[employer] Patient Information 2015 [employer]. This information is not intended to replace advice given to you by your health care provider. Make sure you discuss any questions you have with your health care provider.  

## 2013-10-01 NOTE — Progress Notes (Signed)
Subjective:     Patient ID: Robert Clayton, male   DOB: 1930-03-19, 80 y.o.   MRN: [id number]  HPI long-term diabetic with vascular issues that has been treated who has nail disease that are painful and thick and he cannot cut and lesions underneath the fifth metatarsals of both feet that are painful. He does have diabetic shoes for 2010 which are completely lost her ability to hold up his arch   Review of Systems  All other systems reviewed and are negative.      Objective:   Physical Exam  Nursing note and vitals reviewed. Constitutional: He is oriented to person, place, and time.  Musculoskeletal: Normal range of motion.  Neurological: He is oriented to person, place, and time.  Skin: Skin is warm and dry.   vascular status is found to be diminished both PT and DP pulses and I noted neurologically patient is intact but diminished. Patient is noted to have thick yellow brittle nailbeds 1-5 of both feet and keratotic lesion sub-fifth metatarsal pre-ulcerative in nature with no drainage currently both feet and does have good digital perfusion and is well oriented x3     Assessment:     At risk diabetic with nail disease and lesion of the plantar aspect of both feet    Plan:     H&P and conditions and diabetic foot education rendered to patient. Debris did nailbeds 1-5 of both feet and lesions on the fifth metatarsal of both feet with no iatrogenic bleeding noted reappoint to recheck and we are sending for authorization for diabetic shoes

## 2013-10-09 IMAGING — CR DG SHOULDER 2+V*R*
2 series · 2 of 2 positions shown · non-contrast
Comparison: Plain films 07/05/2003.

CLINICAL DATA: Fall.  Right shoulder pain.

RIGHT SHOULDER - 2+ VIEW

[t shoulder ap internal righ]
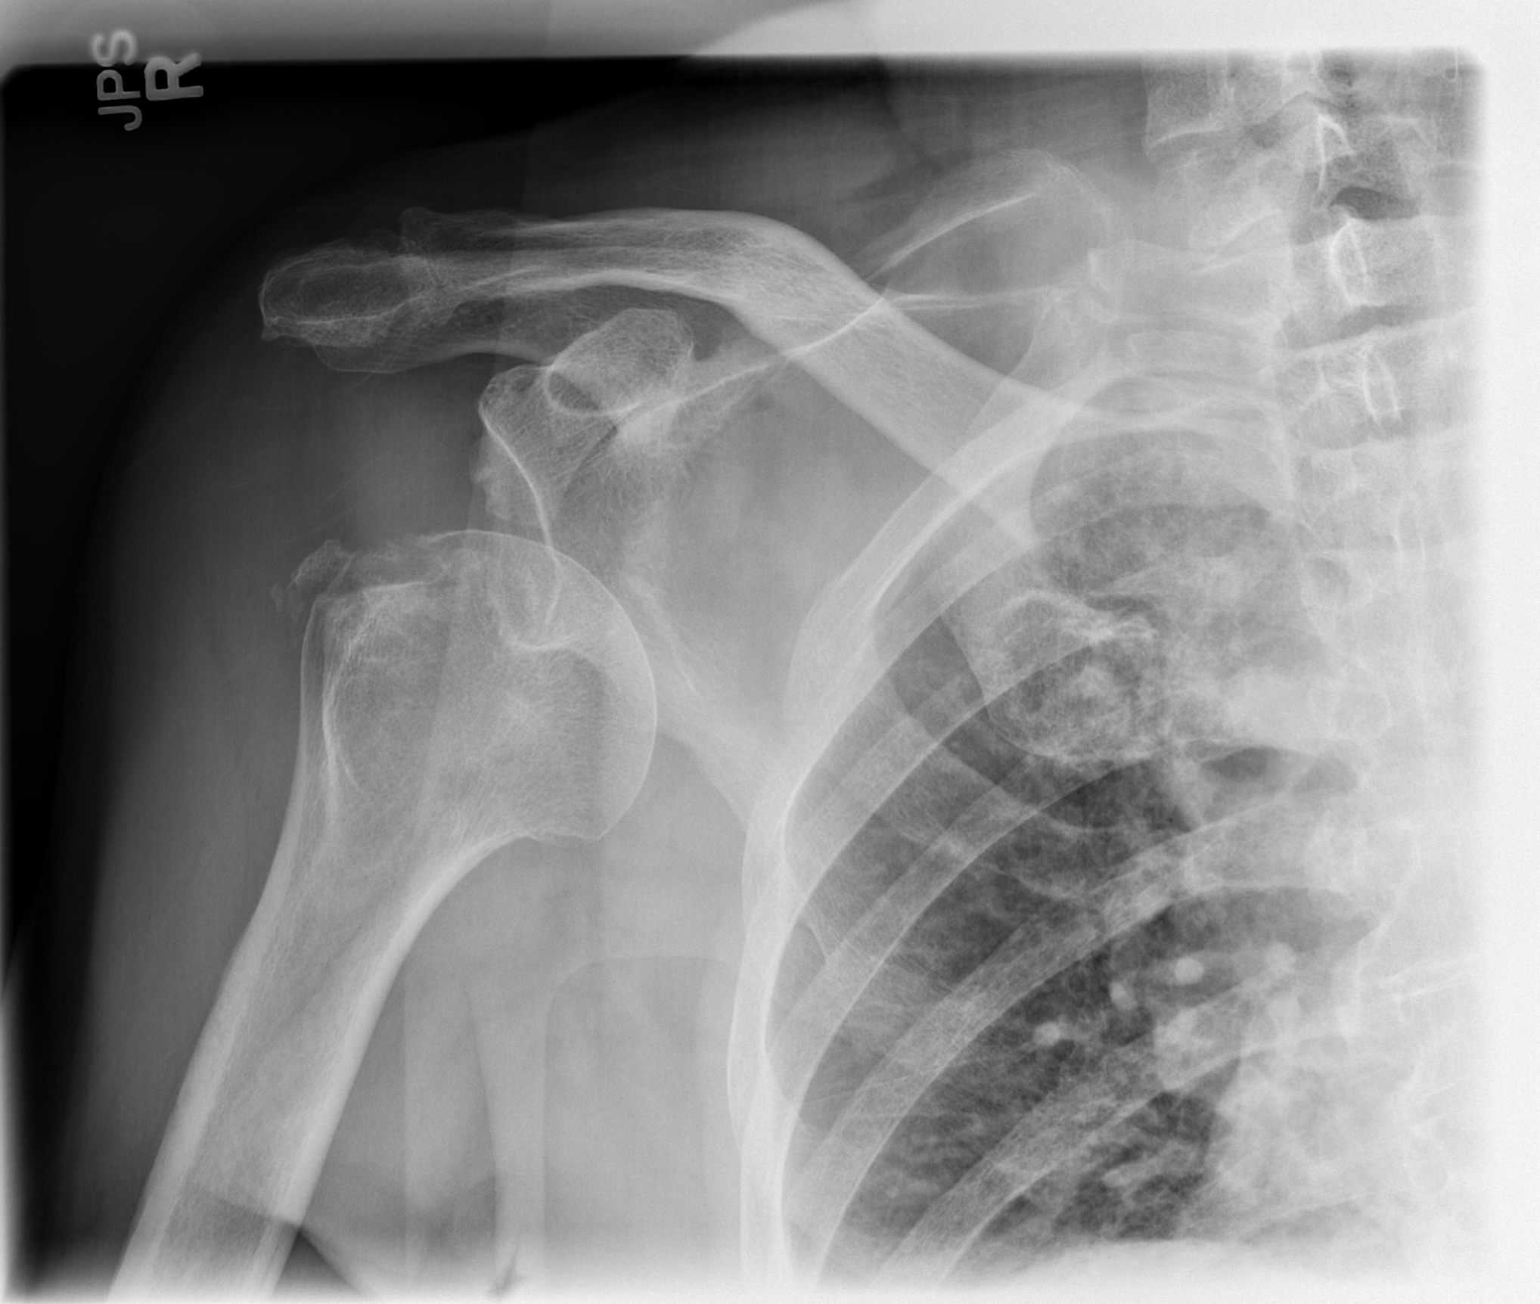

[t shoulder y view right]
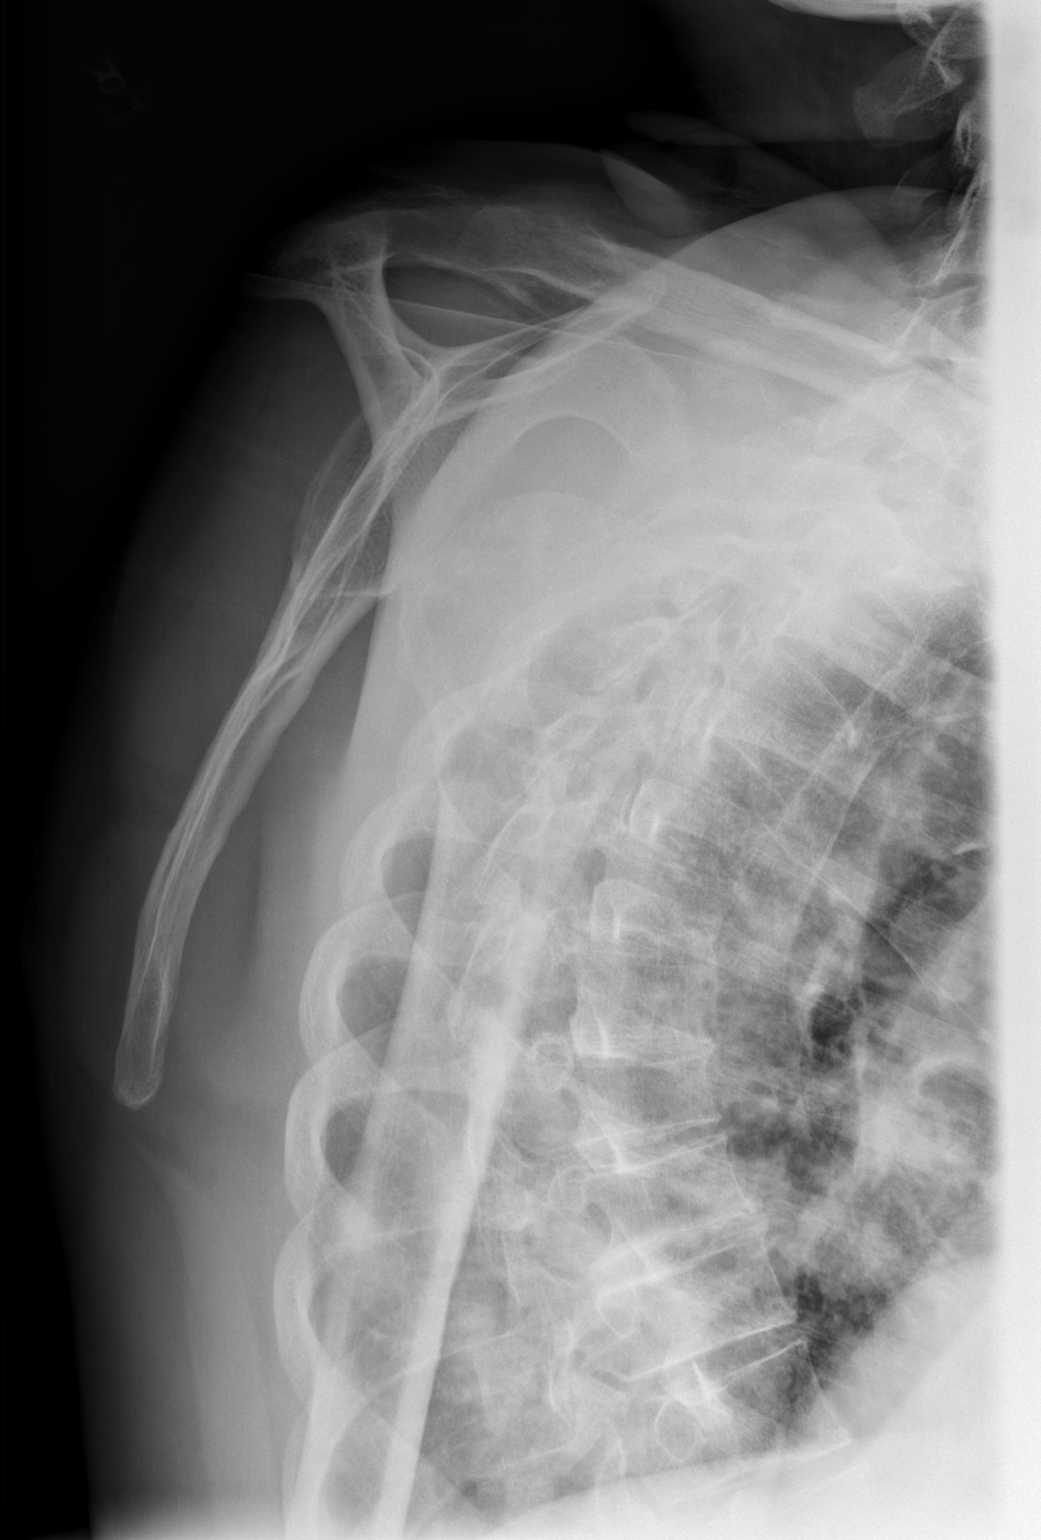

[2 of 2 positions shown; findings below may reference images not displayed]

FINDINGS: The humerus is anteriorly dislocated.  There is a Hill-
Sachs deformity.  Cortical irregularity of the anterior, inferior
glenoid is worrisome for a bony Bankart.  Acromioclavicular
degenerative change is identified.  Imaged lung parenchyma and ribs
are unremarkable.
IMPRESSION: 1.  Anterior dislocation right humerus with Hill-Sachs deformity
and likely bony Bankart.
2.  Acromioclavicular osteoarthritis.

## 2013-10-09 IMAGING — CR DG FEMUR 2+V*R*
1 series · 1 of 1 positions shown · non-contrast
Comparison: None.

CLINICAL DATA: Status post fall.  Right hip and femur pain.

RIGHT FEMUR - 2 VIEW

[view not recorded]
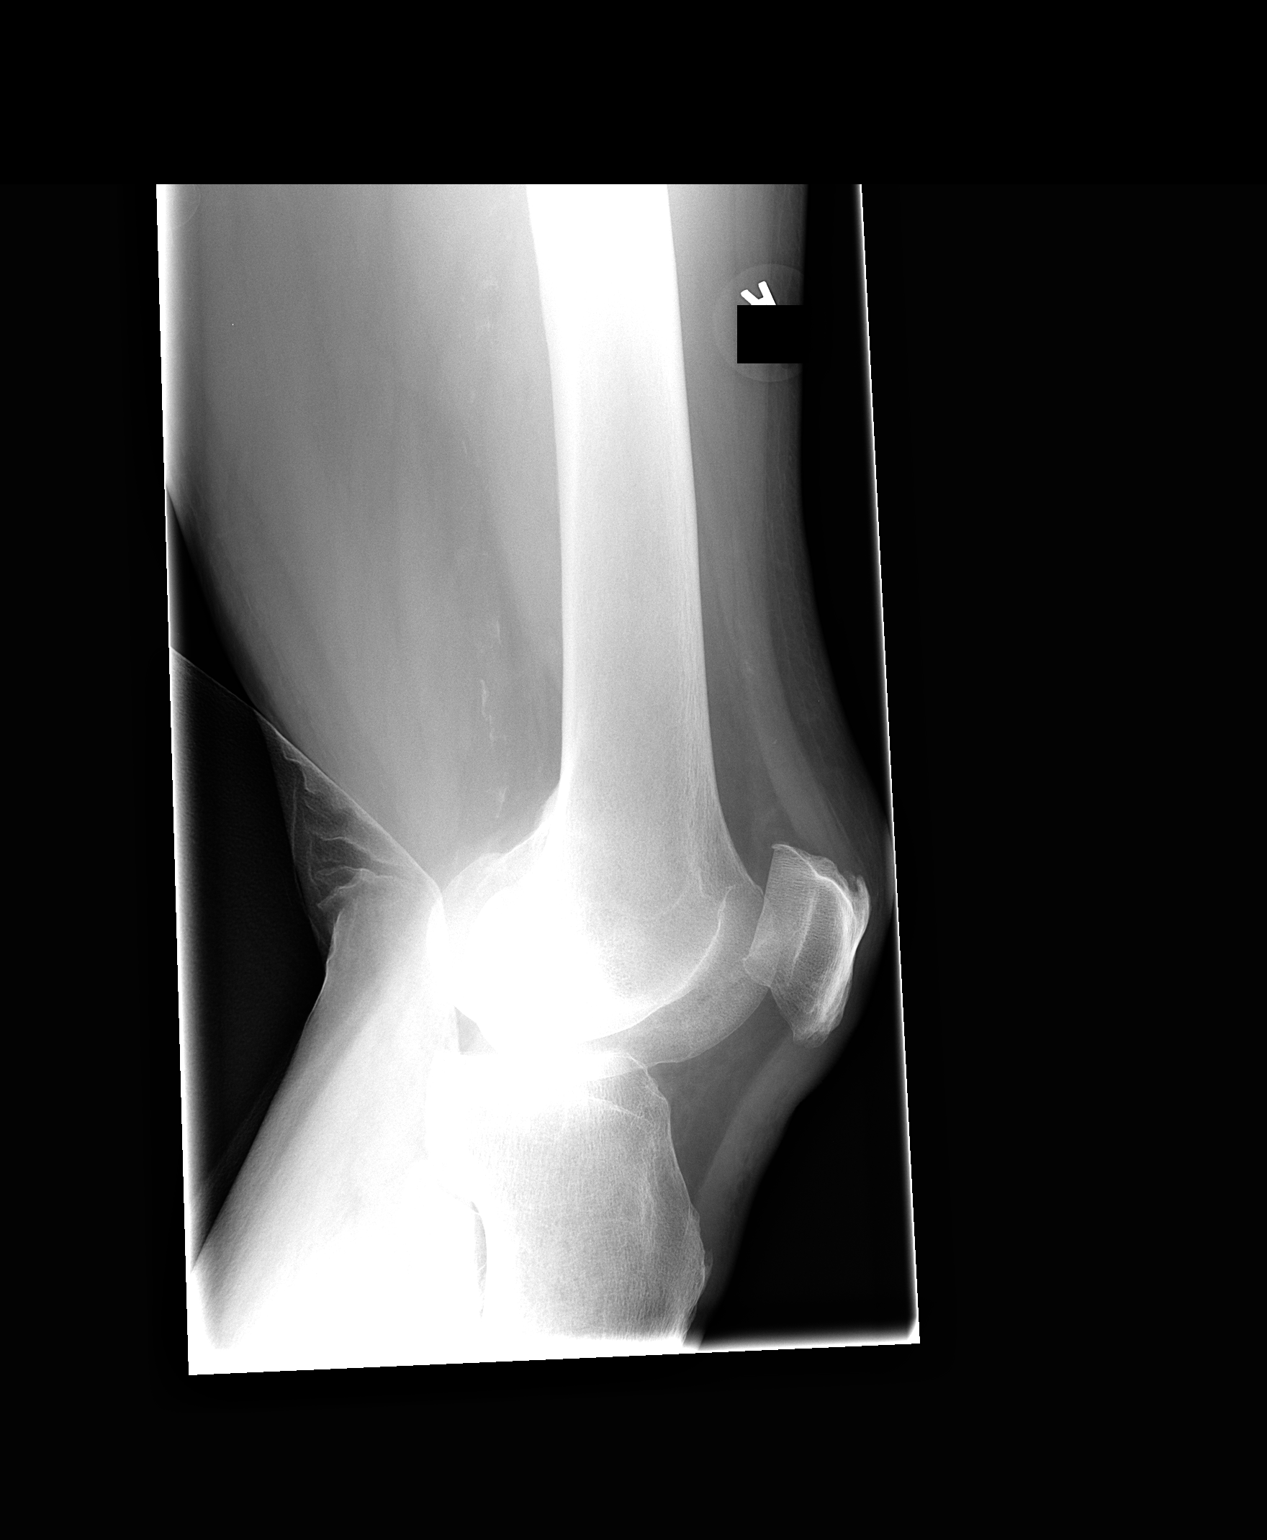

[1 of 1 positions shown; findings below may reference images not displayed]

FINDINGS: No fracture, dislocation or focal bony lesion is
identified.  Radiotherapy seeds for prostate cancer noted.
IMPRESSION: No acute finding.

## 2013-10-19 DIAGNOSIS — H269 Unspecified cataract: Secondary | ICD-10-CM | POA: Diagnosis not present

## 2013-10-19 DIAGNOSIS — E1165 Type 2 diabetes mellitus with hyperglycemia: Secondary | ICD-10-CM | POA: Diagnosis not present

## 2013-10-21 ENCOUNTER — Telehealth: Payer: Self-pay | Admitting: *Deleted

## 2013-10-21 NOTE — Telephone Encounter (Signed)
I'm calling for [employer].  I'm calling to see if his shoes have come in.  When you get a chance please call me, bye.  Already spoken to the patient.

## 2013-10-21 NOTE — Telephone Encounter (Signed)
I'm calling for [employer].  We want to know if his shoes have come in.  Thank you.  I called and informed her that shoes have not been ordered.  We are waiting on authorization from your Diabetic doctor.  Once we get the authorization, we will contact you to schedule an appointment to be measured for the shoes as well as you picking out the shoes you want.  She stated she didn't understand what I was saying so she put her husband on the phone and I explained it to him.

## 2013-10-24 ENCOUNTER — Other Ambulatory Visit: Payer: Self-pay | Admitting: Internal Medicine

## 2013-10-25 ENCOUNTER — Ambulatory Visit (INDEPENDENT_AMBULATORY_CARE_PROVIDER_SITE_OTHER): Payer: Medicare Other

## 2013-10-25 DIAGNOSIS — D518 Other vitamin B12 deficiency anemias: Secondary | ICD-10-CM

## 2013-10-25 DIAGNOSIS — IMO0002 Reserved for concepts with insufficient information to code with codable children: Secondary | ICD-10-CM | POA: Insufficient documentation

## 2013-10-25 MED ORDER — CYANOCOBALAMIN 1000 MCG/ML IJ SOLN
1000.0000 ug | Freq: Once | INTRAMUSCULAR | Status: AC
  Administered 2013-10-25: 1000 ug via INTRAMUSCULAR

## 2013-10-27 ENCOUNTER — Telehealth: Payer: Self-pay | Admitting: Endocrinology

## 2013-10-27 NOTE — Telephone Encounter (Signed)
Unable to reach pt will try again at a later time.  

## 2013-10-27 NOTE — Telephone Encounter (Signed)
Patient would like to speak with [facility]   Please call back

## 2013-10-28 MED ORDER — INSULIN NPH ISOPHANE & REGULAR (70-30) 100 UNIT/ML ~~LOC~~ SUSP: SUBCUTANEOUS | Status: DC

## 2013-10-28 NOTE — Telephone Encounter (Signed)
Called pt. He states that at his last ov it was discussed about switching to vial insulin. Pt states that he is ready to switch if MD is still ok with this change. Thanks!

## 2013-10-28 NOTE — Telephone Encounter (Signed)
done

## 2013-10-28 NOTE — Telephone Encounter (Signed)
Pt advised that vials have been sent. Instructed pt that if he had any questions once he started taking the insulin if he had any questions he could be set up with Vaughan Basta to go over the technique of injecting.

## 2013-11-11 ENCOUNTER — Other Ambulatory Visit: Payer: Self-pay

## 2013-11-11 MED ORDER — ONETOUCH DELICA LANCETS 33G MISC: Status: DC

## 2013-11-19 ENCOUNTER — Ambulatory Visit: Payer: Medicare Other

## 2013-11-22 ENCOUNTER — Encounter: Payer: Self-pay | Admitting: Internal Medicine

## 2013-11-22 ENCOUNTER — Other Ambulatory Visit: Payer: Self-pay

## 2013-11-22 ENCOUNTER — Ambulatory Visit (INDEPENDENT_AMBULATORY_CARE_PROVIDER_SITE_OTHER): Payer: Medicare Other | Admitting: Internal Medicine

## 2013-11-22 VITALS — BP 138/88 | HR 58 | Temp 98.6°F | Resp 16 | Ht 71.0 in | Wt 218.0 lb

## 2013-11-22 DIAGNOSIS — N181 Chronic kidney disease, stage 1: Secondary | ICD-10-CM

## 2013-11-22 DIAGNOSIS — E1159 Type 2 diabetes mellitus with other circulatory complications: Secondary | ICD-10-CM | POA: Diagnosis not present

## 2013-11-22 DIAGNOSIS — N184 Chronic kidney disease, stage 4 (severe): Secondary | ICD-10-CM | POA: Diagnosis not present

## 2013-11-22 DIAGNOSIS — D518 Other vitamin B12 deficiency anemias: Secondary | ICD-10-CM | POA: Diagnosis not present

## 2013-11-22 DIAGNOSIS — I120 Hypertensive chronic kidney disease with stage 5 chronic kidney disease or end stage renal disease: Secondary | ICD-10-CM

## 2013-11-22 DIAGNOSIS — N182 Chronic kidney disease, stage 2 (mild): Secondary | ICD-10-CM

## 2013-11-22 DIAGNOSIS — I2510 Atherosclerotic heart disease of native coronary artery without angina pectoris: Secondary | ICD-10-CM | POA: Diagnosis not present

## 2013-11-22 DIAGNOSIS — E1140 Type 2 diabetes mellitus with diabetic neuropathy, unspecified: Secondary | ICD-10-CM

## 2013-11-22 DIAGNOSIS — N19 Unspecified kidney failure: Secondary | ICD-10-CM

## 2013-11-22 DIAGNOSIS — N185 Chronic kidney disease, stage 5: Secondary | ICD-10-CM

## 2013-11-22 DIAGNOSIS — IMO0002 Reserved for concepts with insufficient information to code with codable children: Secondary | ICD-10-CM

## 2013-11-22 DIAGNOSIS — N183 Chronic kidney disease, stage 3 (moderate): Secondary | ICD-10-CM | POA: Diagnosis not present

## 2013-11-22 DIAGNOSIS — E1165 Type 2 diabetes mellitus with hyperglycemia: Secondary | ICD-10-CM

## 2013-11-22 DIAGNOSIS — N189 Chronic kidney disease, unspecified: Secondary | ICD-10-CM

## 2013-11-22 DIAGNOSIS — E1151 Type 2 diabetes mellitus with diabetic peripheral angiopathy without gangrene: Secondary | ICD-10-CM

## 2013-11-22 DIAGNOSIS — Z23 Encounter for immunization: Secondary | ICD-10-CM

## 2013-11-22 MED ORDER — CYANOCOBALAMIN 1000 MCG/ML IJ SOLN
1000.0000 ug | Freq: Once | INTRAMUSCULAR | Status: AC
  Administered 2013-11-22: 1000 ug via INTRAMUSCULAR

## 2013-11-22 MED ORDER — PREGABALIN 75 MG PO CAPS: 75.0000 mg | ORAL_CAPSULE | Freq: Every evening | ORAL | Status: DC | PRN

## 2013-11-22 MED ORDER — GLUCOSE BLOOD VI STRP: ORAL_STRIP | Status: DC

## 2013-11-22 NOTE — Assessment & Plan Note (Signed)
His BP is well controlled His renal function is stable

## 2013-11-22 NOTE — Progress Notes (Signed)
Subjective:    Patient ID: Robert Clayton, male    DOB: 1930/01/12, 80 y.o.   MRN: [id number]  Hypertension This is a chronic problem. The current episode started more than 1 year ago. The problem is controlled. Pertinent negatives include no anxiety, blurred vision, chest pain, headaches, malaise/fatigue, neck pain, orthopnea, palpitations, peripheral edema, PND, shortness of breath or sweats. Past treatments include beta blockers and angiotensin blockers. The current treatment provides moderate improvement. Compliance problems include exercise and diet.  Hypertensive end-organ damage includes kidney disease. Identifiable causes of hypertension include chronic renal disease.      Review of Systems  Constitutional: Negative.  Negative for fever, chills, malaise/fatigue, diaphoresis, appetite change and fatigue.  HENT: Negative.   Eyes: Negative.  Negative for blurred vision.  Respiratory: Negative.  Negative for cough, choking, chest tightness, shortness of breath and stridor.   Cardiovascular: Negative.  Negative for chest pain, palpitations, orthopnea, leg swelling and PND.  Gastrointestinal: Negative.  Negative for nausea, vomiting, abdominal pain, diarrhea, constipation and blood in stool.  Endocrine: Negative.   Genitourinary: Negative.   Musculoskeletal: Negative.  Negative for myalgias, back pain, joint swelling, gait problem and neck pain.       He complains of burning pain with pins and needles sensations in both feet at night while he is trying to sleep, he takes tylenol and that helps some but he wants something in addition to that to reduce the pain.  Skin: Negative.   Allergic/Immunologic: Negative.   Neurological: Negative.  Negative for tremors, seizures, speech difficulty, weakness, light-headedness and headaches.  Hematological: Negative.  Negative for adenopathy. Does not bruise/bleed easily.  Psychiatric/Behavioral: Negative.        Objective:   Physical Exam    Constitutional: He is oriented to person, place, and time. He appears well-developed and well-nourished. No distress.  HENT:  Head: Normocephalic and atraumatic.  Mouth/Throat: Oropharynx is clear and moist. No oropharyngeal exudate.  Eyes: Conjunctivae are normal. Right eye exhibits no discharge. Left eye exhibits no discharge. No scleral icterus.  Neck: Normal range of motion. Neck supple. No JVD present. No tracheal deviation present. No thyromegaly present.  Cardiovascular: Normal rate, regular rhythm, normal heart sounds and intact distal pulses.  Exam reveals no gallop and no friction rub.   No murmur heard. Pulmonary/Chest: Effort normal and breath sounds normal. No stridor. No respiratory distress. He has no wheezes. He has no rales. He exhibits no tenderness.  Abdominal: Soft. Bowel sounds are normal. He exhibits no distension and no mass. There is no tenderness. There is no rebound and no guarding.  Musculoskeletal: Normal range of motion. He exhibits no edema or tenderness.  Lymphadenopathy:    He has no cervical adenopathy.  Neurological: He is oriented to person, place, and time.  Skin: Skin is warm and dry. No rash noted. He is not diaphoretic. No erythema. No pallor.  Vitals reviewed.     Lab Results  Component Value Date   WBC 9.0 09/21/2013   HGB 14.1 09/21/2013   HCT 42.6 09/21/2013   PLT 103.0* 09/21/2013   GLUCOSE 160* 09/21/2013   CHOL 136 09/21/2013   TRIG 128.0 09/21/2013   HDL 30.20* 09/21/2013   LDLDIRECT 80.6 06/26/2010   LDLCALC 80 09/21/2013   ALT 17 09/21/2013   AST 18 09/21/2013   NA 142 09/21/2013   K 4.3 09/21/2013   CL 107 09/21/2013   CREATININE 2.0* 09/21/2013   BUN 26* 09/21/2013   CO2 28 09/21/2013  TSH 1.21 09/21/2013   PSA 0.03* 06/29/2009   INR 1.1 04/08/2008   HGBA1C 8.7* 09/21/2013   MICROALBUR 1.7 03/23/2008      Assessment & Plan:

## 2013-11-22 NOTE — Assessment & Plan Note (Signed)
He will try lyrica for this

## 2013-11-22 NOTE — Patient Instructions (Signed)

## 2013-11-22 NOTE — Progress Notes (Signed)
Pre visit review using our clinic review tool, if applicable. No additional management support is needed unless otherwise documented below in the visit note. 

## 2013-11-23 ENCOUNTER — Telehealth: Payer: Self-pay | Admitting: Endocrinology

## 2013-11-23 MED ORDER — ONETOUCH DELICA LANCETS 33G MISC: Status: DC

## 2013-11-23 MED ORDER — GLUCOSE BLOOD VI STRP: ORAL_STRIP | Status: DC

## 2013-11-23 NOTE — Telephone Encounter (Signed)
Patient would like refill sent to pharmacy   One touch verio test strips  One touch delicate lancets   Pharmacy: [facility]    Thank you

## 2013-11-23 NOTE — Telephone Encounter (Signed)
Rx sent to pharmacy   

## 2013-11-25 ENCOUNTER — Telehealth: Payer: Self-pay | Admitting: Endocrinology

## 2013-11-25 MED ORDER — ONETOUCH DELICA LANCETS 33G MISC: Status: DC

## 2013-11-25 NOTE — Telephone Encounter (Signed)
Rx sent per pt's request.  

## 2013-11-25 NOTE — Telephone Encounter (Signed)
Patient prescription for lancets strips

## 2013-12-20 ENCOUNTER — Ambulatory Visit (INDEPENDENT_AMBULATORY_CARE_PROVIDER_SITE_OTHER): Payer: Medicare Other | Admitting: Podiatry

## 2013-12-20 DIAGNOSIS — M79673 Pain in unspecified foot: Secondary | ICD-10-CM | POA: Diagnosis not present

## 2013-12-20 DIAGNOSIS — E1151 Type 2 diabetes mellitus with diabetic peripheral angiopathy without gangrene: Secondary | ICD-10-CM | POA: Diagnosis not present

## 2013-12-20 DIAGNOSIS — Q828 Other specified congenital malformations of skin: Secondary | ICD-10-CM

## 2013-12-20 DIAGNOSIS — B351 Tinea unguium: Secondary | ICD-10-CM

## 2013-12-20 NOTE — Patient Instructions (Signed)
Diabetes and Foot Care Diabetes may cause you to have problems because of poor blood supply (circulation) to your feet and legs. This may cause the skin on your feet to become thinner, break easier, and heal more slowly. Your skin may become dry, and the skin may peel and crack. You may also have nerve damage in your legs and feet causing decreased feeling in them. You may not notice minor injuries to your feet that could lead to infections or more serious problems. Taking care of your feet is one of the most important things you can do for yourself.  HOME CARE INSTRUCTIONS  Wear shoes at all times, even in the house. Do not go barefoot. Bare feet are easily injured.  Check your feet daily for blisters, cuts, and redness. If you cannot see the bottom of your feet, use a mirror or ask someone for help.  Wash your feet with warm water (do not use hot water) and mild soap. Then pat your feet and the areas between your toes until they are completely dry. Do not soak your feet as this can dry your skin.  Apply a moisturizing lotion or petroleum jelly (that does not contain alcohol and is unscented) to the skin on your feet and to dry, brittle toenails. Do not apply lotion between your toes.  Trim your toenails straight across. Do not dig under them or around the cuticle. File the edges of your nails with an emery board or nail file.  Do not cut corns or calluses or try to remove them with medicine.  Wear clean socks or stockings every day. Make sure they are not too tight. Do not wear knee-high stockings since they may decrease blood flow to your legs.  Wear shoes that fit properly and have enough cushioning. To break in new shoes, wear them for just a few hours a day. This prevents you from injuring your feet. Always look in your shoes before you put them on to be sure there are no objects inside.  Do not cross your legs. This may decrease the blood flow to your feet.  If you find a minor scrape,  cut, or break in the skin on your feet, keep it and the skin around it clean and dry. These areas may be cleansed with mild soap and water. Do not cleanse the area with peroxide, alcohol, or iodine.  When you remove an adhesive bandage, be sure not to damage the skin around it.  If you have a wound, look at it several times a day to make sure it is healing.  Do not use heating pads or hot water bottles. They may burn your skin. If you have lost feeling in your feet or legs, you may not know it is happening until it is too late.  Make sure your health care provider performs a complete foot exam at least annually or more often if you have foot problems. Report any cuts, sores, or bruises to your health care provider immediately. SEEK MEDICAL CARE IF:   You have an injury that is not healing.  You have cuts or breaks in the skin.  You have an ingrown nail.  You notice redness on your legs or feet.  You feel burning or tingling in your legs or feet.  You have pain or cramps in your legs and feet.  Your legs or feet are numb.  Your feet always feel cold. SEEK IMMEDIATE MEDICAL CARE IF:   There is increasing redness,   swelling, or pain in or around a wound.  There is a red line that goes up your leg.  Pus is coming from a wound.  You develop a fever or as directed by your health care provider.  You notice a bad smell coming from an ulcer or wound. Document Released: 12/22/1999 Document Revised: 08/26/2012 Document Reviewed: 06/02/2012 [facility] Patient Information 2015 [place], [place]. This information is not intended to replace advice given to you by your health care provider. Make sure you discuss any questions you have with your health care provider.   Diabetic Shoe and Insoles Instructions   Congratulations on receiving your new shoes and insoles. In accordance with Medicare regulations, they have been selected from our own inventory or have been special ordered to provide you  with the optimum comfort and protection. In order to receive the greatest benefit from this footwear, please follow these suggested guidelines.   Initial use of your diabetic shoes It may take a couple of days for you to feel fully comfortable wearing your new diabetic shoes and insoles. Some people are immediately comfortable wearing them, while others need more time to adjust. Generally, the body does not adapt to change rapidly and you may experience mild aches or fatigue when you first begin to wear your shoes.  Wear these shoes as long as they are comfortable. Try to only wear them indoors until you feel that they are comfortable for outdoor use.  If they bother your feet, TAKE THEM OFF and try them again a few hours later or the next day. Check for any soreness or swelling and notify your doctor immediately if you notice any of these signs. Otherwise, increase wear time as they become more comfortable. If you feel like your shoes are bothersome still after trying these tips, please call our office. Keep in mind, that once the outer sole of the shoe becomes dirty, we can no longer send them back.  The insoles should be changed every 4 months and replaced with a new pair.   Maintenance of your new shoes Your new diabetic shoes can be washed with mild soap and water and air dried. Try to keep away from high heat sources (heaters, dryers, fireplaces, etc.) as this may damage or distort the plastazote lining and insert in the shoe. Do NOT machine wash or use harsh chemicals such as bleach on your shoes. These shoes, if properly taken care of, should last one year.   Continuing Care Diabetic patients have fewer foot complications if they have been properly fitted in the correct type of footwear and accommodating insoles. The desired outcome is to fit you with the most appropriate, best fitting shoe possible in order to reduce the risk of and prevent foot complications, which could ultimately lead to  ulceration, infection and amputation. Medicare will pay for one pair of extra depth shoes and 3 pairs of insoles per calendar year.   REMEMBER TO SEE YOUR PODIATRIST FOR REGULAR FOOT EXAMS AT LEAST ONCE PER YEAR.

## 2013-12-21 NOTE — Progress Notes (Signed)
Subjective:     Patient ID: Robert Clayton, male   DOB: [birth year], 80 y.o.   MRN: [id number]  HPI patient presents with chronic lesion formation diminished circulatory status with long-term diabetes nail disease 1-5 both feet and lesion formation that are painful.   Review of Systems     Objective:   Physical Exam Neurovascular status diminished but unchanged with thick nails 1-5 both feet that are painful when palpated and impossible to cut along with lesion formation and diminished circulatory status of both feet with long-term diabetes    Assessment:     At risk diabetic with mycotic painful infections of the nailbeds 1-5 both feet and painful lesion formation presen    Plan:     Today I went ahead and I debrided nailbeds 1-5 both feet and dispensed diabetic shoes for the at risk condition that he has. They fit very well and he satisfied and will be seen back for routine visit 3 months earlier if any issues should occur

## 2013-12-22 ENCOUNTER — Ambulatory Visit (INDEPENDENT_AMBULATORY_CARE_PROVIDER_SITE_OTHER): Payer: Medicare Other

## 2013-12-22 ENCOUNTER — Ambulatory Visit: Payer: Medicare Other

## 2013-12-22 DIAGNOSIS — E1151 Type 2 diabetes mellitus with diabetic peripheral angiopathy without gangrene: Secondary | ICD-10-CM

## 2013-12-22 DIAGNOSIS — E1165 Type 2 diabetes mellitus with hyperglycemia: Secondary | ICD-10-CM

## 2013-12-22 DIAGNOSIS — IMO0002 Reserved for concepts with insufficient information to code with codable children: Secondary | ICD-10-CM

## 2013-12-22 DIAGNOSIS — D518 Other vitamin B12 deficiency anemias: Secondary | ICD-10-CM | POA: Diagnosis not present

## 2013-12-22 MED ORDER — CYANOCOBALAMIN 1000 MCG/ML IJ SOLN
1000.0000 ug | Freq: Once | INTRAMUSCULAR | Status: AC
  Administered 2013-12-22: 1000 ug via INTRAMUSCULAR

## 2013-12-27 ENCOUNTER — Ambulatory Visit: Payer: Medicare Other | Admitting: Podiatry

## 2014-01-14 ENCOUNTER — Encounter: Payer: Self-pay | Admitting: Endocrinology

## 2014-01-14 ENCOUNTER — Ambulatory Visit (INDEPENDENT_AMBULATORY_CARE_PROVIDER_SITE_OTHER): Payer: Medicare Other | Admitting: Endocrinology

## 2014-01-14 VITALS — BP 130/82 | HR 74 | Temp 98.5°F | Ht 71.0 in | Wt 221.0 lb

## 2014-01-14 DIAGNOSIS — E1151 Type 2 diabetes mellitus with diabetic peripheral angiopathy without gangrene: Secondary | ICD-10-CM

## 2014-01-14 DIAGNOSIS — D518 Other vitamin B12 deficiency anemias: Secondary | ICD-10-CM

## 2014-01-14 DIAGNOSIS — E1165 Type 2 diabetes mellitus with hyperglycemia: Principal | ICD-10-CM

## 2014-01-14 DIAGNOSIS — E1159 Type 2 diabetes mellitus with other circulatory complications: Secondary | ICD-10-CM | POA: Diagnosis not present

## 2014-01-14 DIAGNOSIS — IMO0002 Reserved for concepts with insufficient information to code with codable children: Secondary | ICD-10-CM

## 2014-01-14 MED ORDER — CYANOCOBALAMIN 1000 MCG/ML IJ SOLN
1000.0000 ug | Freq: Once | INTRAMUSCULAR | Status: AC
  Administered 2014-01-14: 1000 ug via INTRAMUSCULAR
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
## 2014-01-14 NOTE — Patient Instructions (Signed)
check your blood sugar twice a day.  vary the time of day when you check, between before the 3 meals, and at bedtime.  also check if you have symptoms of your blood sugar being too high or too low.  please keep a record of the readings and bring it to your next appointment here.  please call us sooner if your blood sugar goes below 70, or if you have a lot of readings over 200.   Please come back for a follow-up appointment in 3 months.   Please change the insulin to 130 units with breakfast, and 20 with the evening meal.   On this type of insulin schedule, you should eat meals on a regular schedule.  If a meal is missed or significantly delayed, your blood sugar could go low.

## 2014-01-25 ENCOUNTER — Ambulatory Visit: Payer: Medicare Other

## 2014-02-01 DIAGNOSIS — E119 Type 2 diabetes mellitus without complications: Secondary | ICD-10-CM | POA: Diagnosis not present

## 2014-02-01 DIAGNOSIS — H5213 Myopia, bilateral: Secondary | ICD-10-CM | POA: Diagnosis not present

## 2014-02-01 DIAGNOSIS — D485 Neoplasm of uncertain behavior of skin: Secondary | ICD-10-CM | POA: Diagnosis not present

## 2014-02-01 DIAGNOSIS — H2513 Age-related nuclear cataract, bilateral: Secondary | ICD-10-CM | POA: Diagnosis not present

## 2014-02-15 ENCOUNTER — Ambulatory Visit (INDEPENDENT_AMBULATORY_CARE_PROVIDER_SITE_OTHER): Payer: Medicare Other

## 2014-02-15 DIAGNOSIS — D518 Other vitamin B12 deficiency anemias: Secondary | ICD-10-CM | POA: Diagnosis not present

## 2014-02-15 MED ORDER — CYANOCOBALAMIN 1000 MCG/ML IJ SOLN
1000.0000 ug | Freq: Once | INTRAMUSCULAR | Status: AC
  Administered 2014-02-15: 1000 ug via INTRAMUSCULAR

## 2014-03-07 ENCOUNTER — Other Ambulatory Visit: Payer: Self-pay | Admitting: Internal Medicine

## 2014-03-17 DIAGNOSIS — H25042 Posterior subcapsular polar age-related cataract, left eye: Secondary | ICD-10-CM | POA: Diagnosis not present

## 2014-03-17 DIAGNOSIS — H2512 Age-related nuclear cataract, left eye: Secondary | ICD-10-CM | POA: Diagnosis not present

## 2014-03-17 DIAGNOSIS — H25812 Combined forms of age-related cataract, left eye: Secondary | ICD-10-CM | POA: Diagnosis not present

## 2014-03-21 ENCOUNTER — Encounter: Payer: Self-pay | Admitting: Podiatry

## 2014-03-21 ENCOUNTER — Ambulatory Visit (INDEPENDENT_AMBULATORY_CARE_PROVIDER_SITE_OTHER): Payer: Medicare Other | Admitting: Podiatry

## 2014-03-21 DIAGNOSIS — B351 Tinea unguium: Secondary | ICD-10-CM

## 2014-03-21 DIAGNOSIS — M79673 Pain in unspecified foot: Secondary | ICD-10-CM | POA: Diagnosis not present

## 2014-03-21 DIAGNOSIS — Q828 Other specified congenital malformations of skin: Secondary | ICD-10-CM

## 2014-03-21 NOTE — Progress Notes (Signed)
Subjective:     Patient ID: Robert Clayton, male   DOB: March 07, 1930, 81 y.o.   MRN: [id number]  HPI patient presents with chronic lesion formation diminished circulatory status with long-term diabetes nail disease 1-5 both feet and lesion formation that are painful.   Review of Systems     Objective:   Physical Exam Neurovascular status diminished but unchanged with thick nails 1-5 both feet that are painful when palpated and impossible to cut along with lesion formation and diminished circulatory status of both feet with long-term diabetes    Assessment:     At risk diabetic with mycotic painful infections of the nailbeds 1-5 both feet and painful lesion formation presen    Plan:     Today I went ahead and I debrided nailbeds 1-5 both feet and dispensed diabetic shoes for the at risk condition that he has. They fit very well and he satisfied and will be seen back for routine visit 3 months earlier if any issues should occur

## 2014-03-23 ENCOUNTER — Encounter: Payer: Self-pay | Admitting: Internal Medicine

## 2014-03-23 ENCOUNTER — Telehealth: Payer: Self-pay

## 2014-03-23 ENCOUNTER — Other Ambulatory Visit (INDEPENDENT_AMBULATORY_CARE_PROVIDER_SITE_OTHER): Payer: Medicare Other

## 2014-03-23 ENCOUNTER — Ambulatory Visit (INDEPENDENT_AMBULATORY_CARE_PROVIDER_SITE_OTHER): Payer: Medicare Other | Admitting: Internal Medicine

## 2014-03-23 VITALS — BP 140/80 | HR 61 | Temp 98.5°F | Resp 16 | Ht 71.0 in | Wt 225.0 lb

## 2014-03-23 DIAGNOSIS — D518 Other vitamin B12 deficiency anemias: Secondary | ICD-10-CM

## 2014-03-23 DIAGNOSIS — N182 Chronic kidney disease, stage 2 (mild): Secondary | ICD-10-CM | POA: Diagnosis not present

## 2014-03-23 DIAGNOSIS — E1159 Type 2 diabetes mellitus with other circulatory complications: Secondary | ICD-10-CM | POA: Diagnosis not present

## 2014-03-23 DIAGNOSIS — E1140 Type 2 diabetes mellitus with diabetic neuropathy, unspecified: Secondary | ICD-10-CM

## 2014-03-23 DIAGNOSIS — N183 Chronic kidney disease, stage 3 (moderate): Secondary | ICD-10-CM

## 2014-03-23 DIAGNOSIS — N185 Chronic kidney disease, stage 5: Secondary | ICD-10-CM

## 2014-03-23 DIAGNOSIS — N184 Chronic kidney disease, stage 4 (severe): Secondary | ICD-10-CM | POA: Diagnosis not present

## 2014-03-23 DIAGNOSIS — IMO0002 Reserved for concepts with insufficient information to code with codable children: Secondary | ICD-10-CM

## 2014-03-23 DIAGNOSIS — N181 Chronic kidney disease, stage 1: Secondary | ICD-10-CM

## 2014-03-23 DIAGNOSIS — E1165 Type 2 diabetes mellitus with hyperglycemia: Secondary | ICD-10-CM

## 2014-03-23 DIAGNOSIS — I129 Hypertensive chronic kidney disease with stage 1 through stage 4 chronic kidney disease, or unspecified chronic kidney disease: Secondary | ICD-10-CM | POA: Diagnosis not present

## 2014-03-23 DIAGNOSIS — E1151 Type 2 diabetes mellitus with diabetic peripheral angiopathy without gangrene: Secondary | ICD-10-CM

## 2014-03-23 DIAGNOSIS — N189 Chronic kidney disease, unspecified: Secondary | ICD-10-CM

## 2014-03-23 LAB — BASIC METABOLIC PANEL
BUN: 24 mg/dL — ABNORMAL HIGH (ref 6–23)
CALCIUM: 9.1 mg/dL (ref 8.4–10.5)
CHLORIDE: 113 meq/L — AB (ref 96–112)
CO2: 27 mEq/L (ref 19–32)
CREATININE: 2.17 mg/dL — AB (ref 0.40–1.50)
GFR: 37.7 mL/min — ABNORMAL LOW (ref >60.00)
Glucose, Bld: 77 mg/dL (ref 70–99)
Potassium: 4.3 mEq/L (ref 3.5–5.1)
Sodium: 145 mEq/L (ref 135–145)

## 2014-03-23 LAB — MICROALBUMIN / CREATININE URINE RATIO
CREATININE, U: 117.7 mg/dL
MICROALB/CREAT RATIO: 4.8 mg/g (ref 0.0–30.0)
Microalb, Ur: 5.6 mg/dL — ABNORMAL HIGH (ref 0.0–1.9)

## 2014-03-23 LAB — HEMOGLOBIN A1C: Hgb A1c MFr Bld: 7.6 % — ABNORMAL HIGH (ref 4.6–6.5)

## 2014-03-23 MED ORDER — INSULIN NPH ISOPHANE & REGULAR (70-30) 100 UNIT/ML ~~LOC~~ SUSP
SUBCUTANEOUS | Status: DC
Start: 2014-03-23 — End: 2014-04-20

## 2014-03-23 MED ORDER — CYANOCOBALAMIN 1000 MCG/ML IJ SOLN
1000.0000 ug | Freq: Once | INTRAMUSCULAR | Status: AC
  Administered 2014-03-23: 1000 ug via INTRAMUSCULAR

## 2014-03-23 NOTE — Patient Instructions (Signed)

## 2014-03-23 NOTE — Assessment & Plan Note (Signed)
Will monitor his A1C He has an apt with ENDO in one month

## 2014-03-23 NOTE — Progress Notes (Signed)
Subjective:    Patient ID: Robert Clayton, male    DOB: Jul 02, 1930, 81 y.o.   MRN: [id number]  Diabetes Pertinent negatives for hypoglycemia include no dizziness, headaches, sweats or tremors. Pertinent negatives for diabetes include no blurred vision, no chest pain, no fatigue, no polydipsia, no polyphagia, no polyuria and no weakness.  Hypertension This is a chronic problem. The current episode started more than 1 year ago. The problem is unchanged. The problem is controlled. Pertinent negatives include no anxiety, blurred vision, chest pain, headaches, malaise/fatigue, neck pain, orthopnea, palpitations, peripheral edema, PND, shortness of breath or sweats. Agents associated with hypertension include NSAIDs. Past treatments include angiotensin blockers. There are no compliance problems.  Hypertensive end-organ damage includes kidney disease. Identifiable causes of hypertension include chronic renal disease.      Review of Systems  Constitutional: Negative.  Negative for fever, chills, malaise/fatigue, diaphoresis, appetite change and fatigue.  HENT: Negative.   Eyes: Negative.  Negative for blurred vision.  Respiratory: Negative.  Negative for cough, choking, chest tightness, shortness of breath and stridor.   Cardiovascular: Negative.  Negative for chest pain, palpitations, orthopnea, leg swelling and PND.  Gastrointestinal: Negative.  Negative for nausea, vomiting, abdominal pain, diarrhea, constipation and blood in stool.  Endocrine: Negative.  Negative for polydipsia, polyphagia and polyuria.  Genitourinary: Negative.  Negative for urgency, hematuria, decreased urine volume and difficulty urinating.  Musculoskeletal: Positive for arthralgias. Negative for myalgias, back pain and neck pain.  Skin: Negative.   Allergic/Immunologic: Negative.   Neurological: Negative for dizziness, tremors, weakness, light-headedness, numbness and headaches.  Hematological: Negative.  Negative for  adenopathy. Does not bruise/bleed easily.  Psychiatric/Behavioral: Negative.        Objective:   Physical Exam  Constitutional: He is oriented to person, place, and time. He appears well-developed and well-nourished. No distress.  HENT:  Head: Normocephalic and atraumatic.  Mouth/Throat: Oropharynx is clear and moist. No oropharyngeal exudate.  Eyes: Conjunctivae are normal. Right eye exhibits no discharge. Left eye exhibits no discharge. No scleral icterus.  Neck: Normal range of motion. Neck supple. No JVD present. No tracheal deviation present. No thyromegaly present.  Cardiovascular: Normal rate, regular rhythm, S1 normal, S2 normal and intact distal pulses.  Exam reveals no gallop, no S3, no S4 and no friction rub.   Murmur heard.  Decrescendo systolic murmur is present with a grade of 1/6   No diastolic murmur is present  Pulmonary/Chest: Effort normal and breath sounds normal. No stridor. No respiratory distress. He has no wheezes. He has no rales. He exhibits no tenderness.  Abdominal: Soft. Bowel sounds are normal. He exhibits no distension and no mass. There is no tenderness. There is no rebound and no guarding.  Musculoskeletal: Normal range of motion. He exhibits edema (trace edema in BLE). He exhibits no tenderness.  Lymphadenopathy:    He has no cervical adenopathy.  Neurological: He is oriented to person, place, and time.  Skin: Skin is warm and dry. No rash noted. He is not diaphoretic. No erythema. No pallor.  Psychiatric: He has a normal mood and affect. His behavior is normal. Judgment and thought content normal.  Nursing note and vitals reviewed.    Lab Results  Component Value Date   WBC 9.0 09/21/2013   HGB 14.1 09/21/2013   HCT 42.6 09/21/2013   PLT 103.0* 09/21/2013   GLUCOSE 160* 09/21/2013   CHOL 136 09/21/2013   TRIG 128.0 09/21/2013   HDL 30.20* 09/21/2013   LDLDIRECT 80.6 06/26/2010  LDLCALC 80 09/21/2013   ALT 17 09/21/2013   AST 18 09/21/2013     NA 142 09/21/2013   K 4.3 09/21/2013   CL 107 09/21/2013   CREATININE 2.0* 09/21/2013   BUN 26* 09/21/2013   CO2 28 09/21/2013   TSH 1.21 09/21/2013   PSA 0.03* 06/29/2009   INR 1.1 04/08/2008   HGBA1C 8.7* 09/21/2013   MICROALBUR 1.7 03/23/2008       Assessment & Plan:

## 2014-03-23 NOTE — Telephone Encounter (Signed)
Patient was in the office today and stated that his pharmacy has requested refills for his insulin as well as the syringes needed to go with that. Patient would like this request sent to [facility] listed in chart. Thanks

## 2014-03-23 NOTE — Telephone Encounter (Signed)
Rx sent to pharmacy per pt's request.  

## 2014-03-23 NOTE — Assessment & Plan Note (Signed)
His BP is adequately well controlled I have asked him to stop taking nsaids Will monitor his renal function and lytes today

## 2014-04-15 ENCOUNTER — Ambulatory Visit: Payer: Medicare Other | Admitting: Endocrinology

## 2014-04-15 ENCOUNTER — Telehealth: Payer: Self-pay | Admitting: Endocrinology

## 2014-04-15 NOTE — Telephone Encounter (Signed)
Patient no showed today's appt. Please advise on how to follow up. °A. No follow up necessary. °B. Follow up urgent. Contact patient immediately. °C. Follow up necessary. Contact patient and schedule visit in ___ days. °D. Follow up advised. Contact patient and schedule visit in ____weeks. ° °

## 2014-04-16 NOTE — Telephone Encounter (Signed)
Follow up advised. Contact patient and schedule visit in 2-4 weeks.

## 2014-04-18 NOTE — Telephone Encounter (Signed)
Scheduled patient appt.

## 2014-04-20 ENCOUNTER — Encounter: Payer: Self-pay | Admitting: Endocrinology

## 2014-04-20 ENCOUNTER — Ambulatory Visit (INDEPENDENT_AMBULATORY_CARE_PROVIDER_SITE_OTHER): Payer: Medicare Other | Admitting: Endocrinology

## 2014-04-20 VITALS — BP 134/90 | HR 71 | Temp 98.2°F | Wt 223.0 lb

## 2014-04-20 DIAGNOSIS — D518 Other vitamin B12 deficiency anemias: Secondary | ICD-10-CM | POA: Diagnosis not present

## 2014-04-20 DIAGNOSIS — IMO0002 Reserved for concepts with insufficient information to code with codable children: Secondary | ICD-10-CM

## 2014-04-20 DIAGNOSIS — E1165 Type 2 diabetes mellitus with hyperglycemia: Secondary | ICD-10-CM

## 2014-04-20 DIAGNOSIS — E1159 Type 2 diabetes mellitus with other circulatory complications: Secondary | ICD-10-CM | POA: Diagnosis not present

## 2014-04-20 DIAGNOSIS — E1151 Type 2 diabetes mellitus with diabetic peripheral angiopathy without gangrene: Secondary | ICD-10-CM

## 2014-04-20 MED ORDER — CYANOCOBALAMIN 1000 MCG/ML IJ SOLN
1000.0000 ug | Freq: Once | INTRAMUSCULAR | Status: AC
  Administered 2014-04-20: 1000 ug via INTRAMUSCULAR

## 2014-04-20 MED ORDER — INSULIN NPH ISOPHANE & REGULAR (70-30) 100 UNIT/ML ~~LOC~~ SUSP: SUBCUTANEOUS | Status: DC

## 2014-04-20 MED ORDER — INSULIN NPH ISOPHANE & REGULAR (70-30) 100 UNIT/ML ~~LOC~~ SUSP: 150.0000 [IU] | Freq: Every day | SUBCUTANEOUS | Status: DC

## 2014-04-20 NOTE — Patient Instructions (Addendum)
check your blood sugar twice a day.  vary the time of day when you check, between before the 3 meals, and at bedtime.  also check if you have symptoms of your blood sugar being too high or too low.  please keep a record of the readings and bring it to your next appointment here.  please call us sooner if your blood sugar goes below 70, or if you have a lot of readings over 200.   Please come back for a follow-up appointment in 3 months.  Please change insulin to 150 units each morning, and none in the evening.  On this type of insulin schedule, you should eat meals on a regular schedule.  If a meal is missed or significantly delayed, your blood sugar could go low.
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
## 2014-04-22 ENCOUNTER — Telehealth: Payer: Self-pay

## 2014-04-22 MED ORDER — INSULIN NPH ISOPHANE & REGULAR (70-30) 100 UNIT/ML ~~LOC~~ SUSP: SUBCUTANEOUS | Status: DC

## 2014-04-22 NOTE — Telephone Encounter (Signed)
ok 

## 2014-04-22 NOTE — Telephone Encounter (Signed)
Pt's pharmacy called stating the Humulin 70/30 is too expensive for the pt. Wanted to know if we could change to Novolin 70/30. Novolin 70/30 will cost the pt 45$.  Please advise, Thanks!

## 2014-04-22 NOTE — Telephone Encounter (Signed)
Rx sent to pharmacy   

## 2014-05-09 ENCOUNTER — Emergency Department (HOSPITAL_COMMUNITY)
Admission: EM | Admit: 2014-05-09 | Discharge: 2014-05-09 | Disposition: A | Discharge status: Home / Self Care | Payer: Medicare Other | Attending: Emergency Medicine | Admitting: Emergency Medicine

## 2014-05-09 ENCOUNTER — Emergency Department (HOSPITAL_COMMUNITY): Payer: Medicare Other

## 2014-05-09 ENCOUNTER — Encounter (HOSPITAL_COMMUNITY): Payer: Self-pay | Admitting: Emergency Medicine

## 2014-05-09 DIAGNOSIS — M79622 Pain in left upper arm: Secondary | ICD-10-CM | POA: Diagnosis not present

## 2014-05-09 DIAGNOSIS — Z794 Long term (current) use of insulin: Secondary | ICD-10-CM | POA: Insufficient documentation

## 2014-05-09 DIAGNOSIS — R9431 Abnormal electrocardiogram [ECG] [EKG]: Secondary | ICD-10-CM | POA: Diagnosis not present

## 2014-05-09 DIAGNOSIS — Z79899 Other long term (current) drug therapy: Secondary | ICD-10-CM | POA: Diagnosis not present

## 2014-05-09 DIAGNOSIS — I2510 Atherosclerotic heart disease of native coronary artery without angina pectoris: Secondary | ICD-10-CM | POA: Insufficient documentation

## 2014-05-09 DIAGNOSIS — Z8546 Personal history of malignant neoplasm of prostate: Secondary | ICD-10-CM | POA: Insufficient documentation

## 2014-05-09 DIAGNOSIS — S4992XA Unspecified injury of left shoulder and upper arm, initial encounter: Secondary | ICD-10-CM | POA: Diagnosis not present

## 2014-05-09 DIAGNOSIS — Y9389 Activity, other specified: Secondary | ICD-10-CM | POA: Insufficient documentation

## 2014-05-09 DIAGNOSIS — Y92092 Bedroom in other non-institutional residence as the place of occurrence of the external cause: Secondary | ICD-10-CM | POA: Diagnosis not present

## 2014-05-09 DIAGNOSIS — S40012A Contusion of left shoulder, initial encounter: Secondary | ICD-10-CM | POA: Diagnosis not present

## 2014-05-09 DIAGNOSIS — W19XXXA Unspecified fall, initial encounter: Secondary | ICD-10-CM

## 2014-05-09 DIAGNOSIS — I129 Hypertensive chronic kidney disease with stage 1 through stage 4 chronic kidney disease, or unspecified chronic kidney disease: Secondary | ICD-10-CM | POA: Diagnosis not present

## 2014-05-09 DIAGNOSIS — N181 Chronic kidney disease, stage 1: Secondary | ICD-10-CM | POA: Diagnosis not present

## 2014-05-09 DIAGNOSIS — Y998 Other external cause status: Secondary | ICD-10-CM | POA: Diagnosis not present

## 2014-05-09 DIAGNOSIS — R220 Localized swelling, mass and lump, head: Secondary | ICD-10-CM | POA: Diagnosis not present

## 2014-05-09 DIAGNOSIS — E119 Type 2 diabetes mellitus without complications: Secondary | ICD-10-CM | POA: Insufficient documentation

## 2014-05-09 DIAGNOSIS — S0990XA Unspecified injury of head, initial encounter: Secondary | ICD-10-CM | POA: Diagnosis not present

## 2014-05-09 DIAGNOSIS — M109 Gout, unspecified: Secondary | ICD-10-CM | POA: Insufficient documentation

## 2014-05-09 DIAGNOSIS — Z8781 Personal history of (healed) traumatic fracture: Secondary | ICD-10-CM | POA: Diagnosis not present

## 2014-05-09 DIAGNOSIS — R51 Headache: Secondary | ICD-10-CM | POA: Diagnosis not present

## 2014-05-09 DIAGNOSIS — R079 Chest pain, unspecified: Secondary | ICD-10-CM | POA: Diagnosis not present

## 2014-05-09 DIAGNOSIS — W06XXXA Fall from bed, initial encounter: Secondary | ICD-10-CM | POA: Diagnosis not present

## 2014-05-09 DIAGNOSIS — M25512 Pain in left shoulder: Secondary | ICD-10-CM | POA: Diagnosis not present

## 2014-05-09 DIAGNOSIS — R030 Elevated blood-pressure reading, without diagnosis of hypertension: Secondary | ICD-10-CM | POA: Diagnosis not present

## 2014-05-09 DIAGNOSIS — S0993XA Unspecified injury of face, initial encounter: Secondary | ICD-10-CM | POA: Insufficient documentation

## 2014-05-09 DIAGNOSIS — Z7982 Long term (current) use of aspirin: Secondary | ICD-10-CM | POA: Insufficient documentation

## 2014-05-09 DIAGNOSIS — Z791 Long term (current) use of non-steroidal anti-inflammatories (NSAID): Secondary | ICD-10-CM | POA: Diagnosis not present

## 2014-05-09 LAB — BASIC METABOLIC PANEL
Anion gap: 7 (ref 5–15)
BUN: 19 mg/dL (ref 6–20)
CALCIUM: 9.1 mg/dL (ref 8.9–10.3)
CO2: 25 mmol/L (ref 22–32)
CREATININE: 1.93 mg/dL — AB (ref 0.61–1.24)
Chloride: 111 mmol/L (ref 101–111)
GFR calc Af Amer: 36 mL/min — ABNORMAL LOW (ref >60)
GFR calc non Af Amer: 31 mL/min — ABNORMAL LOW (ref >60)
GLUCOSE: 132 mg/dL — AB (ref 70–99)
Potassium: 3.8 mmol/L (ref 3.5–5.1)
Sodium: 143 mmol/L (ref 135–145)

## 2014-05-09 LAB — CBC WITH DIFFERENTIAL/PLATELET
BASOS PCT: 0 % (ref 0–1)
Basophils Absolute: 0 10*3/uL (ref 0.0–0.1)
EOS PCT: 2 % (ref 0–5)
Eosinophils Absolute: 0.2 10*3/uL (ref 0.0–0.7)
HEMATOCRIT: 39.3 % (ref 39.0–52.0)
HEMOGLOBIN: 13 g/dL (ref 13.0–17.0)
LYMPHS ABS: 2.5 10*3/uL (ref 0.7–4.0)
Lymphocytes Relative: 31 % (ref 12–46)
MCH: 29.9 pg (ref 26.0–34.0)
MCHC: 33.1 g/dL (ref 30.0–36.0)
MCV: 90.3 fL (ref 78.0–100.0)
Monocytes Absolute: 0.7 10*3/uL (ref 0.1–1.0)
Monocytes Relative: 8 % (ref 3–12)
NEUTROS PCT: 58 % (ref 43–77)
Neutro Abs: 4.7 10*3/uL (ref 1.7–7.7)
PLATELETS: 89 10*3/uL — AB (ref 150–400)
RBC: 4.35 MIL/uL (ref 4.22–5.81)
RDW: 14.7 % (ref 11.5–15.5)
WBC: 8 10*3/uL (ref 4.0–10.5)

## 2014-05-09 LAB — TROPONIN I: Troponin I: <0.03 ng/mL (ref <0.031)
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
## 2014-05-09 NOTE — Discharge Instructions (Signed)
Contusion As we discussed, your Xrays are negative.  The bone appears abnormal so let Dr. Ronnald Ramp know that you may need a bone scan. Return to the ED if you develop chest pain, shortness of breath, or any other concerns. A contusion is a deep bruise. Contusions are the result of an injury that caused bleeding under the skin. The contusion may turn blue, purple, or yellow. Minor injuries will give you a painless contusion, but more severe contusions may stay painful and swollen for a few weeks.  CAUSES  A contusion is usually caused by a blow, trauma, or direct force to an area of the body. SYMPTOMS   Swelling and redness of the injured area.  Bruising of the injured area.  Tenderness and soreness of the injured area.  Pain. DIAGNOSIS  The diagnosis can be made by taking a history and physical exam. An X-ray, CT scan, or MRI may be needed to determine if there were any associated injuries, such as fractures. TREATMENT  Specific treatment will depend on what area of the body was injured. In general, the best treatment for a contusion is resting, icing, elevating, and applying cold compresses to the injured area. Over-the-counter medicines may also be recommended for pain control. Ask your caregiver what the best treatment is for your contusion. HOME CARE INSTRUCTIONS   Put ice on the injured area.  Put ice in a plastic bag.  Place a towel between your skin and the bag.  Leave the ice on for 15-20 minutes, 3-4 times a day, or as directed by your health care provider.  Only take over-the-counter or prescription medicines for pain, discomfort, or fever as directed by your caregiver. Your caregiver may recommend avoiding anti-inflammatory medicines (aspirin, ibuprofen, and naproxen) for 48 hours because these medicines may increase bruising.  Rest the injured area.  If possible, elevate the injured area to reduce swelling. SEEK IMMEDIATE MEDICAL CARE IF:   You have increased bruising or  swelling.  You have pain that is getting worse.  Your swelling or pain is not relieved with medicines. MAKE SURE YOU:   Understand these instructions.  Will watch your condition.  Will get help right away if you are not doing well or get worse. Document Released: 10/03/2004 Document Revised: 12/29/2012 Document Reviewed: 10/29/2010 [facility] Patient Information 2015 [place], [place]. This information is not intended to replace advice given to you by your health care provider. Make sure you discuss any questions you have with your health care provider.

## 2014-05-09 NOTE — ED Notes (Addendum)
Per EMS pt rolled off bed and hit anterior L shoulder on night stand. Pt pain 6/10. Pt noted to have small amount of discoloring to area. Pt tender on palpation. Pt noted to have limited movement with L arm. Pt hit L side of face on night stand as well with tenderness Pt denies LOC and takes 81 ASA daily. Pt A&O

## 2014-05-11 ENCOUNTER — Encounter: Payer: Self-pay | Admitting: Internal Medicine

## 2014-05-11 ENCOUNTER — Ambulatory Visit (INDEPENDENT_AMBULATORY_CARE_PROVIDER_SITE_OTHER): Payer: Medicare Other | Admitting: Internal Medicine

## 2014-05-11 ENCOUNTER — Ambulatory Visit (INDEPENDENT_AMBULATORY_CARE_PROVIDER_SITE_OTHER)
Admission: RE | Admit: 2014-05-11 | Discharge: 2014-05-11 | Disposition: A | Discharge status: Home / Self Care | Payer: Medicare Other | Source: Ambulatory Visit | Attending: Internal Medicine | Admitting: Internal Medicine

## 2014-05-11 VITALS — BP 128/70 | HR 64 | Temp 98.1°F | Resp 16 | Ht 71.0 in | Wt 221.0 lb

## 2014-05-11 DIAGNOSIS — S3992XA Unspecified injury of lower back, initial encounter: Secondary | ICD-10-CM | POA: Diagnosis not present

## 2014-05-11 DIAGNOSIS — M545 Low back pain, unspecified: Secondary | ICD-10-CM | POA: Insufficient documentation

## 2014-05-11 DIAGNOSIS — I129 Hypertensive chronic kidney disease with stage 1 through stage 4 chronic kidney disease, or unspecified chronic kidney disease: Secondary | ICD-10-CM

## 2014-05-11 DIAGNOSIS — N185 Chronic kidney disease, stage 5: Secondary | ICD-10-CM

## 2014-05-11 DIAGNOSIS — N183 Chronic kidney disease, stage 3 (moderate): Secondary | ICD-10-CM | POA: Diagnosis not present

## 2014-05-11 DIAGNOSIS — N189 Chronic kidney disease, unspecified: Secondary | ICD-10-CM

## 2014-05-11 DIAGNOSIS — N181 Chronic kidney disease, stage 1: Secondary | ICD-10-CM

## 2014-05-11 DIAGNOSIS — N182 Chronic kidney disease, stage 2 (mild): Secondary | ICD-10-CM

## 2014-05-11 DIAGNOSIS — N184 Chronic kidney disease, stage 4 (severe): Secondary | ICD-10-CM

## 2014-05-11 DIAGNOSIS — D518 Other vitamin B12 deficiency anemias: Secondary | ICD-10-CM

## 2014-05-11 MED ORDER — CYANOCOBALAMIN 1000 MCG/ML IJ SOLN
1000.0000 ug | Freq: Once | INTRAMUSCULAR | Status: AC
  Administered 2014-05-11: 1000 ug via INTRAMUSCULAR

## 2014-05-11 NOTE — Assessment & Plan Note (Signed)
His exam is WNL, will check plain films to screen for fracture Will cont APAP as needed for pain

## 2014-05-11 NOTE — Progress Notes (Signed)
Subjective:    Patient ID: Robert Clayton, male    DOB: February 05, 1930, 81 y.o.   MRN: [id number]  Back Pain This is a recurrent problem. The current episode started more than 1 year ago. The problem has been gradually worsening since onset. The pain is present in the lumbar spine. The quality of the pain is described as aching. The pain does not radiate. The pain is at a severity of 2/10. The pain is mild. The pain is worse during the night. The symptoms are aggravated by bending and position. Stiffness is present at night. Pertinent negatives include no abdominal pain, bladder incontinence, bowel incontinence, chest pain, dysuria, fever, headaches, leg pain, numbness, paresis, paresthesias, pelvic pain, perianal numbness, tingling, weakness or weight loss. Risk factors include recent trauma (he fell 2 days ago). He has tried analgesics for the symptoms. The treatment provided moderate relief.      Review of Systems  Constitutional: Negative.  Negative for fever, chills, weight loss, diaphoresis, appetite change and fatigue.  HENT: Negative.   Eyes: Negative.   Respiratory: Negative.  Negative for cough, choking, chest tightness, shortness of breath and stridor.   Cardiovascular: Negative.  Negative for chest pain, palpitations and leg swelling.  Gastrointestinal: Negative.  Negative for abdominal pain and bowel incontinence.  Endocrine: Negative.   Genitourinary: Negative.  Negative for bladder incontinence, dysuria and pelvic pain.  Musculoskeletal: Positive for back pain. Negative for joint swelling, arthralgias and gait problem.  Skin: Negative.   Allergic/Immunologic: Negative.   Neurological: Negative.  Negative for tingling, weakness, numbness, headaches and paresthesias.  Hematological: Negative.  Negative for adenopathy. Does not bruise/bleed easily.  Psychiatric/Behavioral: Negative.        Objective:   Physical Exam  Constitutional: He is oriented to person, place, and time. He  appears well-developed and well-nourished. No distress.  HENT:  Head: Normocephalic and atraumatic.  Mouth/Throat: Oropharynx is clear and moist. No oropharyngeal exudate.  Eyes: Conjunctivae are normal. Right eye exhibits no discharge. Left eye exhibits no discharge. No scleral icterus.  Neck: Normal range of motion. Neck supple. No JVD present. No tracheal deviation present. No thyromegaly present.  Cardiovascular: Normal rate, regular rhythm, normal heart sounds and intact distal pulses.  Exam reveals no gallop and no friction rub.   No murmur heard. Pulmonary/Chest: Effort normal and breath sounds normal. No stridor. No respiratory distress. He has no wheezes. He has no rales. He exhibits no tenderness.  Abdominal: Soft. Bowel sounds are normal. He exhibits no distension and no mass. There is no tenderness. There is no rebound and no guarding.  Musculoskeletal: Normal range of motion. He exhibits no edema or tenderness.       Lumbar back: Normal. He exhibits normal range of motion, no tenderness, no bony tenderness, no swelling, no edema, no deformity, no laceration, no pain, no spasm and normal pulse.  Lymphadenopathy:    He has no cervical adenopathy.  Neurological: He is alert and oriented to person, place, and time. He has normal strength. He displays no atrophy, no tremor and normal reflexes. No cranial nerve deficit or sensory deficit. He exhibits normal muscle tone. He displays a negative Romberg sign. He displays no seizure activity. Coordination and gait normal.  Reflex Scores:      Tricep reflexes are 1+ on the right side and 1+ on the left side.      Bicep reflexes are 1+ on the right side and 1+ on the left side.      Brachioradialis  reflexes are 1+ on the right side and 1+ on the left side.      Patellar reflexes are 1+ on the right side and 1+ on the left side.      Achilles reflexes are 1+ on the right side and 1+ on the left side. Neg SLR in BLE  Skin: Skin is warm and dry.  No rash noted. He is not diaphoretic. No erythema. No pallor.  Vitals reviewed.     Lab Results  Component Value Date   WBC 8.0 05/09/2014   HGB 13.0 05/09/2014   HCT 39.3 05/09/2014   PLT 89* 05/09/2014   GLUCOSE 132* 05/09/2014   CHOL 136 09/21/2013   TRIG 128.0 09/21/2013   HDL 30.20* 09/21/2013   LDLDIRECT 80.6 06/26/2010   LDLCALC 80 09/21/2013   ALT 17 09/21/2013   AST 18 09/21/2013   NA 143 05/09/2014   K 3.8 05/09/2014   CL 111 05/09/2014   CREATININE 1.93* 05/09/2014   BUN 19 05/09/2014   CO2 25 05/09/2014   TSH 1.21 09/21/2013   PSA 0.03* 06/29/2009   INR 1.1 04/08/2008   HGBA1C 7.6* 03/23/2014   MICROALBUR 5.6* 03/23/2014      Assessment & Plan:

## 2014-05-11 NOTE — Patient Instructions (Signed)
Back Pain, Adult Low back pain is very common. About 1 in 5 people have back pain.The cause of low back pain is rarely dangerous. The pain often gets better over time.About half of people with a sudden onset of back pain feel better in just 2 weeks. About 8 in 10 people feel better by 6 weeks.  CAUSES Some common causes of back pain include:  Strain of the muscles or ligaments supporting the spine.  Wear and tear (degeneration) of the spinal discs.  Arthritis.  Direct injury to the back. DIAGNOSIS Most of the time, the direct cause of low back pain is not known.However, back pain can be treated effectively even when the exact cause of the pain is unknown.Answering your caregiver's questions about your overall health and symptoms is one of the most accurate ways to make sure the cause of your pain is not dangerous. If your caregiver needs more information, he or she may order lab work or imaging tests (X-rays or MRIs).However, even if imaging tests show changes in your back, this usually does not require surgery. HOME CARE INSTRUCTIONS For many people, back pain returns.Since low back pain is rarely dangerous, it is often a condition that people can learn to manageon their own.   Remain active. It is stressful on the back to sit or stand in one place. Do not sit, drive, or stand in one place for more than 30 minutes at a time. Take short walks on level surfaces as soon as pain allows.Try to increase the length of time you walk each day.  Do not stay in bed.Resting more than 1 or 2 days can delay your recovery.  Do not avoid exercise or work.Your body is made to move.It is not dangerous to be active, even though your back may hurt.Your back will likely heal faster if you return to being active before your pain is gone.  Pay attention to your body when you bend and lift. Many people have less discomfortwhen lifting if they bend their knees, keep the load close to their bodies,and  avoid twisting. Often, the most comfortable positions are those that put less stress on your recovering back.  Find a comfortable position to sleep. Use a firm mattress and lie on your side with your knees slightly bent. If you lie on your back, put a pillow under your knees.  Only take over-the-counter or prescription medicines as directed by your caregiver. Over-the-counter medicines to reduce pain and inflammation are often the most helpful.Your caregiver may prescribe muscle relaxant drugs.These medicines help dull your pain so you can more quickly return to your normal activities and healthy exercise.  Put ice on the injured area.  Put ice in a plastic bag.  Place a towel between your skin and the bag.  Leave the ice on for 15-20 minutes, 03-04 times a day for the first 2 to 3 days. After that, ice and heat may be alternated to reduce pain and spasms.  Ask your caregiver about trying back exercises and gentle massage. This may be of some benefit.  Avoid feeling anxious or stressed.Stress increases muscle tension and can worsen back pain.It is important to recognize when you are anxious or stressed and learn ways to manage it.Exercise is a great option. SEEK MEDICAL CARE IF:  You have pain that is not relieved with rest or medicine.  You have pain that does not improve in 1 week.  You have new symptoms.  You are generally not feeling well. SEEK   IMMEDIATE MEDICAL CARE IF:   You have pain that radiates from your back into your legs.  You develop new bowel or bladder control problems.  You have unusual weakness or numbness in your arms or legs.  You develop nausea or vomiting.  You develop abdominal pain.  You feel faint. Document Released: 12/24/2004 Document Revised: 06/25/2011 Document Reviewed: 04/27/2013 Exit[employer] Patient Information 2015 [employer]. This information is not intended to replace advice given to you by your health care provider. Make sure you  discuss any questions you have with your health care provider.  

## 2014-05-11 NOTE — Progress Notes (Signed)
Pre visit review using our clinic review tool, if applicable. No additional management support is needed unless otherwise documented below in the visit note. 

## 2014-05-11 NOTE — Assessment & Plan Note (Signed)
Vit B12 injection today

## 2014-05-11 NOTE — Assessment & Plan Note (Signed)
His BP is well controlled 

## 2014-05-18 ENCOUNTER — Ambulatory Visit: Payer: Medicare Other

## 2014-05-19 DIAGNOSIS — H2511 Age-related nuclear cataract, right eye: Secondary | ICD-10-CM | POA: Diagnosis not present

## 2014-05-19 DIAGNOSIS — H25811 Combined forms of age-related cataract, right eye: Secondary | ICD-10-CM | POA: Diagnosis not present

## 2014-06-13 LAB — HM DIABETES EYE EXAM

## 2014-06-14 ENCOUNTER — Ambulatory Visit (INDEPENDENT_AMBULATORY_CARE_PROVIDER_SITE_OTHER): Payer: Medicare Other | Admitting: *Deleted

## 2014-06-14 DIAGNOSIS — E538 Deficiency of other specified B group vitamins: Secondary | ICD-10-CM | POA: Diagnosis not present

## 2014-06-14 MED ORDER — CYANOCOBALAMIN 1000 MCG/ML IJ SOLN
1000.0000 ug | Freq: Once | INTRAMUSCULAR | Status: AC
  Administered 2014-06-14: 1000 ug via INTRAMUSCULAR

## 2014-06-16 ENCOUNTER — Other Ambulatory Visit: Payer: Self-pay | Admitting: Internal Medicine

## 2014-07-04 ENCOUNTER — Ambulatory Visit (INDEPENDENT_AMBULATORY_CARE_PROVIDER_SITE_OTHER): Payer: Medicare Other | Admitting: Podiatry

## 2014-07-14 ENCOUNTER — Ambulatory Visit (INDEPENDENT_AMBULATORY_CARE_PROVIDER_SITE_OTHER): Payer: Medicare Other

## 2014-07-14 DIAGNOSIS — D649 Anemia, unspecified: Secondary | ICD-10-CM | POA: Diagnosis not present

## 2014-07-14 MED ORDER — CYANOCOBALAMIN 1000 MCG/ML IJ SOLN
1000.0000 ug | Freq: Once | INTRAMUSCULAR | Status: AC
  Administered 2014-07-14: 1000 ug via INTRAMUSCULAR

## 2014-07-20 ENCOUNTER — Telehealth: Payer: Self-pay | Admitting: Endocrinology

## 2014-07-20 ENCOUNTER — Ambulatory Visit: Payer: Medicare Other | Admitting: Endocrinology

## 2014-07-20 NOTE — Telephone Encounter (Signed)
Follow up advised. Contact patient and schedule visit in 2 months.

## 2014-07-20 NOTE — Telephone Encounter (Signed)
Patient no showed today's appt. Please advise on how to follow up. °A. No follow up necessary. °B. Follow up urgent. Contact patient immediately. °C. Follow up necessary. Contact patient and schedule visit in ___ days. °D. Follow up advised. Contact patient and schedule visit in ____weeks. ° °

## 2014-07-21 NOTE — Telephone Encounter (Signed)
No show letter mailed to the pt.

## 2014-07-25 ENCOUNTER — Encounter: Payer: Self-pay | Admitting: Internal Medicine

## 2014-07-25 ENCOUNTER — Ambulatory Visit (INDEPENDENT_AMBULATORY_CARE_PROVIDER_SITE_OTHER): Payer: Medicare Other | Admitting: Internal Medicine

## 2014-07-25 ENCOUNTER — Other Ambulatory Visit (INDEPENDENT_AMBULATORY_CARE_PROVIDER_SITE_OTHER): Payer: Medicare Other

## 2014-07-25 VITALS — BP 120/78 | HR 81 | Temp 98.4°F | Resp 16 | Ht 71.0 in | Wt 216.0 lb

## 2014-07-25 DIAGNOSIS — E1151 Type 2 diabetes mellitus with diabetic peripheral angiopathy without gangrene: Secondary | ICD-10-CM

## 2014-07-25 DIAGNOSIS — N183 Chronic kidney disease, stage 3 (moderate): Secondary | ICD-10-CM

## 2014-07-25 DIAGNOSIS — IMO0002 Reserved for concepts with insufficient information to code with codable children: Secondary | ICD-10-CM

## 2014-07-25 DIAGNOSIS — I2510 Atherosclerotic heart disease of native coronary artery without angina pectoris: Secondary | ICD-10-CM

## 2014-07-25 DIAGNOSIS — E785 Hyperlipidemia, unspecified: Secondary | ICD-10-CM | POA: Diagnosis not present

## 2014-07-25 DIAGNOSIS — N189 Chronic kidney disease, unspecified: Secondary | ICD-10-CM

## 2014-07-25 DIAGNOSIS — I129 Hypertensive chronic kidney disease with stage 1 through stage 4 chronic kidney disease, or unspecified chronic kidney disease: Secondary | ICD-10-CM

## 2014-07-25 DIAGNOSIS — E1165 Type 2 diabetes mellitus with hyperglycemia: Secondary | ICD-10-CM

## 2014-07-25 DIAGNOSIS — N185 Chronic kidney disease, stage 5: Secondary | ICD-10-CM

## 2014-07-25 DIAGNOSIS — N184 Chronic kidney disease, stage 4 (severe): Secondary | ICD-10-CM

## 2014-07-25 DIAGNOSIS — E1159 Type 2 diabetes mellitus with other circulatory complications: Secondary | ICD-10-CM

## 2014-07-25 DIAGNOSIS — D518 Other vitamin B12 deficiency anemias: Secondary | ICD-10-CM

## 2014-07-25 DIAGNOSIS — N181 Chronic kidney disease, stage 1: Secondary | ICD-10-CM

## 2014-07-25 DIAGNOSIS — N182 Chronic kidney disease, stage 2 (mild): Secondary | ICD-10-CM

## 2014-07-25 LAB — LIPID PANEL
Cholesterol: 157 mg/dL (ref 0–200)
HDL: 29.5 mg/dL — AB (ref >39.00)
LDL Cholesterol: 88 mg/dL (ref 0–99)
NonHDL: 127.5
Total CHOL/HDL Ratio: 5
Triglycerides: 198 mg/dL — ABNORMAL HIGH (ref 0.0–149.0)
VLDL: 39.6 mg/dL (ref 0.0–40.0)

## 2014-07-25 LAB — BASIC METABOLIC PANEL
BUN: 28 mg/dL — ABNORMAL HIGH (ref 6–23)
CALCIUM: 9.5 mg/dL (ref 8.4–10.5)
CO2: 24 meq/L (ref 19–32)
CREATININE: 2.02 mg/dL — AB (ref 0.40–1.50)
Chloride: 111 mEq/L (ref 96–112)
GFR: 40.91 mL/min — AB (ref >60.00)
Glucose, Bld: 268 mg/dL — ABNORMAL HIGH (ref 70–99)
Potassium: 5.1 mEq/L (ref 3.5–5.1)
Sodium: 142 mEq/L (ref 135–145)

## 2014-07-25 LAB — HEMOGLOBIN A1C: Hgb A1c MFr Bld: 9.2 % — ABNORMAL HIGH (ref 4.6–6.5)

## 2014-07-25 LAB — TSH: TSH: 1.66 u[IU]/mL (ref 0.35–4.50)

## 2014-07-25 NOTE — Patient Instructions (Signed)

## 2014-07-25 NOTE — Progress Notes (Signed)
Subjective:  Patient ID: Robert Clayton, male    DOB: August 06, 1930  Age: 81 y.o. MRN: [id number]  CC: Diabetes; Hypertension; and Hyperlipidemia   HPI [name] presents for follow up on DM, HTN, and hypercholesterolemia. He feels well and offers no complaints.  Outpatient Prescriptions Prior to Visit  Medication Sig Dispense Refill  . allopurinol (ZYLOPRIM) 300 MG tablet TAKE ONE TABLET BY MOUTH ONCE DAILY 90 tablet 1  . aspirin EC 81 MG tablet Take 81 mg by mouth daily.    [name] levETIRAcetam (KEPPRA) 500 MG tablet Take 500 mg by mouth 2 (two) times daily.    [name] losartan (COZAAR) 100 MG tablet TAKE ONE TABLET BY MOUTH ONCE DAILY 90 tablet 3  . metoprolol (LOPRESSOR) 50 MG tablet TAKE ONE TABLET BY MOUTH TWICE DAILY 180 tablet 3  . pregabalin (LYRICA) 75 MG capsule Take 1 capsule (75 mg total) by mouth at bedtime as needed. (Patient taking differently: Take 75 mg by mouth daily. ) 90 capsule 1  . insulin NPH-regular Human (NOVOLIN 70/30) (70-30) 100 UNIT/ML injection Inject 150 units with breakfast (Patient taking differently: Inject 70 Units into the skin daily with supper. Inject 150 units with breakfast) 50 mL 2  . naproxen sodium (ANAPROX) 220 MG tablet Take 220 mg by mouth 2 (two) times daily with a meal.     No facility-administered medications prior to visit.    ROS Review of Systems  Constitutional: Negative.  Negative for fever, chills, diaphoresis, appetite change and fatigue.  HENT: Negative.   Eyes: Negative.   Respiratory: Negative.  Negative for cough, choking, chest tightness, shortness of breath and stridor.   Cardiovascular: Negative.  Negative for chest pain, palpitations and leg swelling.  Gastrointestinal: Negative.  Negative for nausea, vomiting, abdominal pain, diarrhea, constipation and blood in stool.  Endocrine: Negative.  Negative for polydipsia, polyphagia and polyuria.  Genitourinary: Negative.  Negative for dysuria, urgency, frequency, hematuria, flank pain  and difficulty urinating.  Musculoskeletal: Negative.   Skin: Negative.   Allergic/Immunologic: Negative.   Neurological: Negative.  Negative for dizziness.  Hematological: Negative.  Negative for adenopathy. Does not bruise/bleed easily.  Psychiatric/Behavioral: Negative.     Objective:  BP 120/78 mmHg  Pulse 81  Temp(Src) 98.4 F (36.9 C) (Oral)  Resp 16  Ht 5\' 11"  (1.803 m)  Wt 216 lb (97.977 kg)  BMI 30.14 kg/m2  SpO2 98%  BP Readings from Last 3 Encounters:  07/26/14 134/82  07/25/14 120/78  05/11/14 128/70    Wt Readings from Last 3 Encounters:  07/26/14 217 lb (98.431 kg)  07/25/14 216 lb (97.977 kg)  05/11/14 221 lb (100.245 kg)    Physical Exam  Constitutional: He is oriented to person, place, and time. No distress.  HENT:  Mouth/Throat: Oropharynx is clear and moist. No oropharyngeal exudate.  Eyes: Conjunctivae are normal. Right eye exhibits no discharge. Left eye exhibits no discharge. No scleral icterus.  Neck: Normal range of motion. Neck supple. No JVD present. No tracheal deviation present. No thyromegaly present.  Cardiovascular: Normal rate, regular rhythm, normal heart sounds and intact distal pulses.  Exam reveals no gallop and no friction rub.   No murmur heard. Pulmonary/Chest: Effort normal and breath sounds normal. No stridor. No respiratory distress. He has no wheezes. He has no rales. He exhibits no tenderness.  Abdominal: Soft. Bowel sounds are normal. He exhibits no distension and no mass. There is no tenderness. There is no rebound and no guarding.  Musculoskeletal: Normal range of  motion. He exhibits no edema or tenderness.  Lymphadenopathy:    He has no cervical adenopathy.  Neurological: He is oriented to person, place, and time.  Skin: Skin is warm and dry. No rash noted. He is not diaphoretic. No erythema. No pallor.  Psychiatric: He has a normal mood and affect. His behavior is normal. Judgment and thought content normal.  Vitals  reviewed.   Lab Results  Component Value Date   WBC 8.0 05/09/2014   HGB 13.0 05/09/2014   HCT 39.3 05/09/2014   PLT 89* 05/09/2014   GLUCOSE 268* 07/25/2014   CHOL 157 07/25/2014   TRIG 198.0* 07/25/2014   HDL 29.50* 07/25/2014   LDLDIRECT 80.6 06/26/2010   LDLCALC 88 07/25/2014   ALT 17 09/21/2013   AST 18 09/21/2013   NA 142 07/25/2014   K 5.1 07/25/2014   CL 111 07/25/2014   CREATININE 2.02* 07/25/2014   BUN 28* 07/25/2014   CO2 24 07/25/2014   TSH 1.66 07/25/2014   PSA 0.03* 06/29/2009   INR 1.1 04/08/2008   HGBA1C 9.2* 07/25/2014   MICROALBUR 5.6* 03/23/2014    Dg Lumbar Spine Complete  05/11/2014   CLINICAL DATA:  Five 6 years of low back pain, patient has sustained a fall yesterday ; history of diabetes, prostate malignancy.  EXAM: LUMBAR SPINE - COMPLETE 4+ VIEW  COMPARISON:  Coronal and sagittal reconstructed images from an abdominal and pelvic CT scan dated April 30, 2013  FINDINGS: The lumbar vertebral bodies are preserved in height. The L4-5 disc space exhibits mild narrowing. There is facet joint hypertrophy at L4-5 and at L5-S1. There is no spondylolisthesis. The pedicles and transverse processes are intact. The observed portions of the sacrum exhibit no acute abnormalities.  IMPRESSION: There is no acute compression fracture. There is no dislocation. There is mild degenerative disc space narrowing at L4-5 with facet joint hypertrophy at L4-5 and L5-S1.   Electronically Signed   By: David  Martinique M.D.   On: 05/11/2014 16:12    Assessment & Plan:   [name] was seen today for diabetes, hypertension and hyperlipidemia.  Diagnoses and all orders for this visit:  DM (diabetes mellitus), type 2, uncontrolled, periph vascular complic- his 123456 is up to 9.2%. I have asked him to follow-up with endocrinology to get better control of his blood sugars. Orders: -     Basic metabolic panel; Future -     Hemoglobin A1c; Future -     Ambulatory referral to  Endocrinology  Atherosclerosis of native coronary artery of native heart without angina pectoris- he has not recently had any angina. We'll continue risk modifications with blood pressure control, blood sugar control and statin therapy. Orders: -     Lipid panel; Future  ANEMIA, B12 DEFICIENCY- CBC is normal.  Hypertensive renal disease, stage 1-4 or unspecified chronic kidney disease- renal function is stable. Orders: -     Basic metabolic panel; Future  Hyperlipidemia with target LDL less than 70- he is not quite achieved his LDL goal, I will ask him to start a statin. Orders: -     Lipid panel; Future -     TSH; Future   I have discontinued Mr. Kromer naproxen sodium. I am also having him start on atorvastatin. Additionally, I am having him maintain his aspirin EC, levETIRAcetam, metoprolol, pregabalin, losartan, and allopurinol.  Meds ordered this encounter  Medications  . atorvastatin (LIPITOR) 10 MG tablet    Sig: Take 1 tablet (10 mg total) by mouth  daily.    Dispense:  90 tablet    Refill:  3     Follow-up: Return in about 4 months (around 11/25/2014).  [name], MD

## 2014-07-25 NOTE — Progress Notes (Signed)
Pre visit review using our clinic review tool, if applicable. No additional management support is needed unless otherwise documented below in the visit note. 

## 2014-07-26 ENCOUNTER — Ambulatory Visit (INDEPENDENT_AMBULATORY_CARE_PROVIDER_SITE_OTHER): Payer: Medicare Other | Admitting: Endocrinology

## 2014-07-26 ENCOUNTER — Encounter: Payer: Self-pay | Admitting: Endocrinology

## 2014-07-26 VITALS — BP 134/82 | HR 79 | Temp 98.0°F | Ht 71.0 in | Wt 217.0 lb

## 2014-07-26 DIAGNOSIS — E1159 Type 2 diabetes mellitus with other circulatory complications: Secondary | ICD-10-CM | POA: Diagnosis not present

## 2014-07-26 DIAGNOSIS — E1165 Type 2 diabetes mellitus with hyperglycemia: Principal | ICD-10-CM

## 2014-07-26 DIAGNOSIS — I2510 Atherosclerotic heart disease of native coronary artery without angina pectoris: Secondary | ICD-10-CM | POA: Diagnosis not present

## 2014-07-26 DIAGNOSIS — IMO0002 Reserved for concepts with insufficient information to code with codable children: Secondary | ICD-10-CM

## 2014-07-26 DIAGNOSIS — E1151 Type 2 diabetes mellitus with diabetic peripheral angiopathy without gangrene: Secondary | ICD-10-CM

## 2014-07-26 MED ORDER — INSULIN NPH ISOPHANE & REGULAR (70-30) 100 UNIT/ML ~~LOC~~ SUSP: 180.0000 [IU] | Freq: Every day | SUBCUTANEOUS | Status: DC

## 2014-07-26 MED ORDER — ATORVASTATIN CALCIUM 10 MG PO TABS: 10.0000 mg | ORAL_TABLET | Freq: Every day | ORAL | Status: DC

## 2014-07-26 NOTE — Patient Instructions (Addendum)
check your blood sugar twice a day.  vary the time of day when you check, between before the 3 meals, and at bedtime.  also check if you have symptoms of your blood sugar being too high or too low.  please keep a record of the readings and bring it to your next appointment here.  please call us sooner if your blood sugar goes below 70, or if you have a lot of readings over 200.   Please come back for a follow-up appointment in 3 months.  Please increase the insulin to 180 units each morning, and none in the evening.   On this type of insulin schedule, you should eat meals on a regular schedule.  If a meal is missed or significantly delayed, your blood sugar could go low.
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
## 2014-08-15 ENCOUNTER — Ambulatory Visit (INDEPENDENT_AMBULATORY_CARE_PROVIDER_SITE_OTHER): Payer: Medicare Other

## 2014-08-15 DIAGNOSIS — E538 Deficiency of other specified B group vitamins: Secondary | ICD-10-CM | POA: Diagnosis not present

## 2014-08-15 MED ORDER — CYANOCOBALAMIN 1000 MCG/ML IJ SOLN
1000.0000 ug | Freq: Once | INTRAMUSCULAR | Status: AC
  Administered 2014-08-15: 1000 ug via INTRAMUSCULAR

## 2014-08-18 ENCOUNTER — Telehealth: Payer: Self-pay | Admitting: Endocrinology

## 2014-08-19 NOTE — Telephone Encounter (Signed)
error 

## 2014-08-23 ENCOUNTER — Ambulatory Visit (INDEPENDENT_AMBULATORY_CARE_PROVIDER_SITE_OTHER): Payer: Medicare Other | Admitting: Cardiovascular Disease

## 2014-08-23 ENCOUNTER — Telehealth: Payer: Self-pay | Admitting: Endocrinology

## 2014-08-23 ENCOUNTER — Encounter: Payer: Self-pay | Admitting: Cardiovascular Disease

## 2014-08-23 VITALS — BP 200/100 | HR 78 | Ht 71.0 in | Wt 222.8 lb

## 2014-08-23 DIAGNOSIS — R011 Cardiac murmur, unspecified: Secondary | ICD-10-CM | POA: Diagnosis not present

## 2014-08-23 DIAGNOSIS — I25119 Atherosclerotic heart disease of native coronary artery with unspecified angina pectoris: Secondary | ICD-10-CM

## 2014-08-23 DIAGNOSIS — E785 Hyperlipidemia, unspecified: Secondary | ICD-10-CM | POA: Diagnosis not present

## 2014-08-23 NOTE — Patient Instructions (Signed)
Medication Instructions:  Your physician recommends that you continue on your current medications as directed. Please refer to the Current Medication list given to you today.  Labwork: No new orders.   Testing/Procedures: Your physician has requested that you have an echocardiogram. Echocardiography is a painless test that uses sound waves to create images of your heart. It provides your doctor with information about the size and shape of your heart and how well your heart's chambers and valves are working. This procedure takes approximately one hour. There are no restrictions for this procedure.  Your physician has requested that you have a lexiscan myoview. For further information please visit [email or web address]. Please follow instruction sheet, as given.  Follow-Up: Your physician wants you to follow-up in: 1 YEAR with Dr Burt Knack.  You will receive a reminder letter in the mail two months in advance. If you don't receive a letter, please call our office to schedule the follow-up appointment.   Any Other Special Instructions Will Be Listed Below (If Applicable).

## 2014-08-23 NOTE — Telephone Encounter (Signed)
Patient wife would like to know if insurance information concerning his medication fax over to [facility], please Advise
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
## 2014-08-25 MED ORDER — INSULIN NPH ISOPHANE & REGULAR (70-30) 100 UNIT/ML ~~LOC~~ SUSP: SUBCUTANEOUS | Status: DC

## 2014-08-25 NOTE — Telephone Encounter (Signed)
Pt's novolin 70/30 is no longer covered under his insurance. Can we switch to humulin 70/30?

## 2014-08-25 NOTE — Telephone Encounter (Signed)
Contacted the pt and advised rx hor humulin 70/30 has been sent.

## 2014-08-25 NOTE — Telephone Encounter (Signed)
ok 

## 2014-08-26 ENCOUNTER — Telehealth: Payer: Self-pay | Admitting: Endocrinology

## 2014-08-26 ENCOUNTER — Encounter: Payer: Self-pay | Admitting: Cardiology

## 2014-08-26 NOTE — Telephone Encounter (Signed)
Please call pt back regarding insurance and meds

## 2014-08-26 NOTE — Telephone Encounter (Signed)
I contacted the pt. I advised him that I had called his pharmacy and verified if the novolin 70/30 was covered under his insurance. Novolin 70/30 is covered and the pharmacy will dispense to the pt.

## 2014-08-29 ENCOUNTER — Telehealth (HOSPITAL_COMMUNITY): Payer: Self-pay

## 2014-08-29 NOTE — Telephone Encounter (Signed)
Encounter complete. 

## 2014-08-31 ENCOUNTER — Encounter (HOSPITAL_COMMUNITY): Payer: Medicare Other

## 2014-08-31 ENCOUNTER — Other Ambulatory Visit (HOSPITAL_COMMUNITY): Payer: Medicare Other

## 2014-09-19 ENCOUNTER — Ambulatory Visit: Payer: Medicare Other

## 2014-09-23 ENCOUNTER — Ambulatory Visit (INDEPENDENT_AMBULATORY_CARE_PROVIDER_SITE_OTHER): Payer: Medicare Other

## 2014-09-23 DIAGNOSIS — D649 Anemia, unspecified: Secondary | ICD-10-CM

## 2014-09-23 MED ORDER — CYANOCOBALAMIN 1000 MCG/ML IJ SOLN
1000.0000 ug | Freq: Once | INTRAMUSCULAR | Status: AC
  Administered 2014-09-23: 1000 ug via INTRAMUSCULAR

## 2014-10-24 ENCOUNTER — Other Ambulatory Visit: Payer: Self-pay

## 2014-10-24 ENCOUNTER — Ambulatory Visit (INDEPENDENT_AMBULATORY_CARE_PROVIDER_SITE_OTHER): Payer: Medicare Other

## 2014-10-24 DIAGNOSIS — E538 Deficiency of other specified B group vitamins: Secondary | ICD-10-CM

## 2014-10-24 MED ORDER — CYANOCOBALAMIN 1000 MCG/ML IJ SOLN
1000.0000 ug | Freq: Once | INTRAMUSCULAR | Status: AC
  Administered 2014-10-24: 1000 ug via INTRAMUSCULAR

## 2014-10-24 MED ORDER — NITROGLYCERIN 0.4 MG SL SUBL: 0.4000 mg | SUBLINGUAL_TABLET | SUBLINGUAL | Status: DC | PRN

## 2014-10-28 ENCOUNTER — Encounter: Payer: Self-pay | Admitting: Endocrinology

## 2014-10-28 ENCOUNTER — Ambulatory Visit (INDEPENDENT_AMBULATORY_CARE_PROVIDER_SITE_OTHER): Payer: Medicare Other | Admitting: Endocrinology

## 2014-10-28 VITALS — BP 146/86 | HR 72 | Temp 98.2°F | Ht 71.0 in | Wt 225.2 lb

## 2014-10-28 DIAGNOSIS — I25119 Atherosclerotic heart disease of native coronary artery with unspecified angina pectoris: Secondary | ICD-10-CM

## 2014-10-28 DIAGNOSIS — E1151 Type 2 diabetes mellitus with diabetic peripheral angiopathy without gangrene: Secondary | ICD-10-CM | POA: Diagnosis not present

## 2014-10-28 DIAGNOSIS — E1165 Type 2 diabetes mellitus with hyperglycemia: Secondary | ICD-10-CM | POA: Diagnosis not present

## 2014-10-28 DIAGNOSIS — IMO0002 Reserved for concepts with insufficient information to code with codable children: Secondary | ICD-10-CM

## 2014-10-28 LAB — POCT GLYCOSYLATED HEMOGLOBIN (HGB A1C): Hemoglobin A1C: 7.4
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
## 2014-10-28 NOTE — Patient Instructions (Addendum)
check your blood sugar twice a day.  vary the time of day when you check, between before the 3 meals, and at bedtime.  also check if you have symptoms of your blood sugar being too high or too low.  please keep a record of the readings and bring it to your next appointment here.  please call us sooner if your blood sugar goes below 70, or if you have a lot of readings over 200.   Please come back for a follow-up appointment in 3 months.  Please increase the insulin to 40 units with breakfast, and 40 units with supper.  Please take these numbers no matter what your blood sugar is.   On this type of insulin schedule, you should eat meals on a regular schedule.  If a meal is missed or significantly delayed, your blood sugar could go low.

## 2014-10-31 ENCOUNTER — Encounter: Payer: Self-pay | Admitting: Internal Medicine

## 2014-10-31 ENCOUNTER — Ambulatory Visit (INDEPENDENT_AMBULATORY_CARE_PROVIDER_SITE_OTHER): Payer: Medicare Other | Admitting: Internal Medicine

## 2014-10-31 VITALS — BP 142/58 | HR 73 | Temp 98.5°F | Resp 16 | Wt 223.0 lb

## 2014-10-31 DIAGNOSIS — K222 Esophageal obstruction: Secondary | ICD-10-CM | POA: Insufficient documentation

## 2014-10-31 DIAGNOSIS — K2100 Gastro-esophageal reflux disease with esophagitis, without bleeding: Secondary | ICD-10-CM

## 2014-10-31 DIAGNOSIS — I8311 Varicose veins of right lower extremity with inflammation: Secondary | ICD-10-CM

## 2014-10-31 DIAGNOSIS — I8312 Varicose veins of left lower extremity with inflammation: Secondary | ICD-10-CM

## 2014-10-31 DIAGNOSIS — I25119 Atherosclerotic heart disease of native coronary artery with unspecified angina pectoris: Secondary | ICD-10-CM

## 2014-10-31 DIAGNOSIS — I872 Venous insufficiency (chronic) (peripheral): Secondary | ICD-10-CM | POA: Insufficient documentation

## 2014-10-31 MED ORDER — OMEPRAZOLE 40 MG PO CPDR: 40.0000 mg | DELAYED_RELEASE_CAPSULE | Freq: Every day | ORAL | Status: DC

## 2014-10-31 MED ORDER — VASCULERA PO TABS: 1.0000 | ORAL_TABLET | Freq: Every day | ORAL | Status: DC

## 2014-10-31 NOTE — Progress Notes (Signed)
Subjective:  Patient ID: Robert Clayton, male    DOB: 1930-07-27  Age: 81 y.o. MRN: [id number]  CC: Gastroesophageal Reflux   HPI Robert Clayton presents for follow-up after recent episode of chest pain that occurred 2 weeks ago. He complains of soreness over his upper sternal area. The chest pain has not recurred over the past 2 weeks. He also complains of worsening belching, odynophagia, and dysphagia. He had an EGD done 4 years ago that showed a stricture that was dilated. He is not currently on any acid suppression therapy. He very rarely experiences heartburn.  Outpatient Prescriptions Prior to Visit  Medication Sig Dispense Refill  . allopurinol (ZYLOPRIM) 300 MG tablet TAKE ONE TABLET BY MOUTH ONCE DAILY 90 tablet 1  . aspirin EC 81 MG tablet Take 81 mg by mouth daily.    [name] atorvastatin (LIPITOR) 10 MG tablet Take 1 tablet (10 mg total) by mouth daily. 90 tablet 3  . insulin NPH-regular Human (NOVOLIN 70/30) (70-30) 100 UNIT/ML injection Inject 40 Units into the skin 2 (two) times daily with a meal.     . levETIRAcetam (KEPPRA) 500 MG tablet Take 500 mg by mouth 2 (two) times daily.    [name] losartan (COZAAR) 100 MG tablet TAKE ONE TABLET BY MOUTH ONCE DAILY 90 tablet 3  . metoprolol (LOPRESSOR) 50 MG tablet TAKE ONE TABLET BY MOUTH TWICE DAILY 180 tablet 3  . nitroGLYCERIN (NITROSTAT) 0.4 MG SL tablet Place 1 tablet (0.4 mg total) under the tongue every 5 (five) minutes as needed. x3 doses as needed for chest pain 30 tablet 0  . pregabalin (LYRICA) 75 MG capsule Take 1 capsule (75 mg total) by mouth at bedtime as needed. (Patient taking differently: Take 75 mg by mouth daily. ) 90 capsule 1   No facility-administered medications prior to visit.    ROS Review of Systems  Constitutional: Negative.  Negative for fever, chills, diaphoresis, activity change, appetite change, fatigue and unexpected weight change.  HENT: Positive for trouble swallowing. Negative for sore throat and voice  change.   Eyes: Negative.   Respiratory: Negative.  Negative for cough, choking, chest tightness, shortness of breath and stridor.   Cardiovascular: Negative for chest pain, palpitations and leg swelling.  Gastrointestinal: Negative.  Negative for nausea, vomiting, abdominal pain, diarrhea, constipation and blood in stool.  Endocrine: Negative.   Genitourinary: Negative.   Musculoskeletal: Negative.   Skin: Positive for color change, rash and wound. Negative for pallor.       Over both lower extremities, more on the right than the left, he complains of rash with itching and scratching and development of sores. He does not currently have an ulcer.  Allergic/Immunologic: Negative.   Neurological: Negative.  Negative for dizziness, tremors, weakness, light-headedness, numbness and headaches.  Hematological: Negative.  Negative for adenopathy. Does not bruise/bleed easily.  Psychiatric/Behavioral: Negative.     Objective:  BP 142/58 mmHg  Pulse 73  Temp(Src) 98.5 F (36.9 C) (Oral)  Resp 16  Wt 223 lb (101.152 kg)  SpO2 98%  BP Readings from Last 3 Encounters:  10/31/14 142/58  10/28/14 146/86  08/23/14 200/100    Wt Readings from Last 3 Encounters:  10/31/14 223 lb (101.152 kg)  10/28/14 225 lb 4 oz (102.173 kg)  08/23/14 222 lb 12.8 oz (101.061 kg)    Physical Exam  Constitutional: He is oriented to person, place, and time. He appears well-developed and well-nourished. No distress.  HENT:  Head: Normocephalic and atraumatic.  Mouth/Throat:  Oropharynx is clear and moist. No oropharyngeal exudate.  Eyes: Conjunctivae are normal. Right eye exhibits no discharge. Left eye exhibits no discharge. No scleral icterus.  Neck: Normal range of motion. Neck supple. No JVD present. No tracheal deviation present. No thyromegaly present.  Cardiovascular: Normal rate, regular rhythm, normal heart sounds and intact distal pulses.  Exam reveals no gallop and no friction rub.   No murmur  heard. Pulmonary/Chest: Effort normal and breath sounds normal. No stridor. No respiratory distress. He has no wheezes. He has no rales. He exhibits no tenderness.  Abdominal: Soft. Bowel sounds are normal. He exhibits no distension and no mass. There is no tenderness. There is no rebound and no guarding.  Musculoskeletal: Normal range of motion. He exhibits edema (trace pitting edema over BLE). He exhibits no tenderness.  Lymphadenopathy:    He has no cervical adenopathy.  Neurological: He is oriented to person, place, and time.  Skin: Skin is warm and dry. Rash noted. He is not diaphoretic. No erythema. No pallor.  Over both lower extremities, more on the right than the left, there is evidence of venous stasis. There is scaling, very mild edema, hyperpigmentation and a few excoriations. The excoriations are very small. There is no ulceration at this time.  Psychiatric: He has a normal mood and affect. His behavior is normal. Judgment and thought content normal.  Vitals reviewed.   Lab Results  Component Value Date   WBC 8.0 05/09/2014   HGB 13.0 05/09/2014   HCT 39.3 05/09/2014   PLT 89* 05/09/2014   GLUCOSE 268* 07/25/2014   CHOL 157 07/25/2014   TRIG 198.0* 07/25/2014   HDL 29.50* 07/25/2014   LDLDIRECT 80.6 06/26/2010   LDLCALC 88 07/25/2014   ALT 17 09/21/2013   AST 18 09/21/2013   NA 142 07/25/2014   K 5.1 07/25/2014   CL 111 07/25/2014   CREATININE 2.02* 07/25/2014   BUN 28* 07/25/2014   CO2 24 07/25/2014   TSH 1.66 07/25/2014   PSA 0.03* 06/29/2009   INR 1.1 04/08/2008   HGBA1C 7.4 10/28/2014   MICROALBUR 5.6* 03/23/2014    Dg Lumbar Spine Complete  05/11/2014  CLINICAL DATA:  Five 6 years of low back pain, patient has sustained a fall yesterday ; history of diabetes, prostate malignancy. EXAM: LUMBAR SPINE - COMPLETE 4+ VIEW COMPARISON:  Coronal and sagittal reconstructed images from an abdominal and pelvic CT scan dated April 30, 2013 FINDINGS: The lumbar vertebral  bodies are preserved in height. The L4-5 disc space exhibits mild narrowing. There is facet joint hypertrophy at L4-5 and at L5-S1. There is no spondylolisthesis. The pedicles and transverse processes are intact. The observed portions of the sacrum exhibit no acute abnormalities. IMPRESSION: There is no acute compression fracture. There is no dislocation. There is mild degenerative disc space narrowing at L4-5 with facet joint hypertrophy at L4-5 and L5-S1. Electronically Signed   By: David  Martinique M.D.   On: 05/11/2014 16:12    Assessment & Plan:   Jonnathon was seen today for gastroesophageal reflux.  Diagnoses and all orders for this visit:  Venous stasis dermatitis of both lower extremities -     Dietary Management Product (VASCULERA) TABS; Take 1 capsule by mouth daily.  Gastroesophageal reflux disease with esophagitis-will start a PPI and get an updated EGD done by GI -     omeprazole (PRILOSEC) 40 MG capsule; Take 1 capsule (40 mg total) by mouth daily. -     Ambulatory referral to Gastroenterology  Esophageal stricture- he is due for f/up with GI, will start a PPI -     Ambulatory referral to Gastroenterology   I am having Mr. Vitatoe start on VASCULERA and omeprazole. I am also having him maintain his aspirin EC, levETIRAcetam, metoprolol, pregabalin, losartan, allopurinol, atorvastatin, insulin NPH-regular Human, and nitroGLYCERIN.  Meds ordered this encounter  Medications  . Dietary Management Product (VASCULERA) TABS    Sig: Take 1 capsule by mouth daily.    Dispense:  30 tablet    Refill:  11  . omeprazole (PRILOSEC) 40 MG capsule    Sig: Take 1 capsule (40 mg total) by mouth daily.    Dispense:  30 capsule    Refill:  1     Follow-up: Return in about 3 months (around 01/31/2015).  [name], MD

## 2014-10-31 NOTE — Patient Instructions (Signed)

## 2014-10-31 NOTE — Progress Notes (Signed)
Pre visit review using our clinic review tool, if applicable. No additional management support is needed unless otherwise documented below in the visit note. 

## 2014-11-11 ENCOUNTER — Encounter: Payer: Medicare Other | Admitting: Cardiovascular Disease

## 2014-11-17 ENCOUNTER — Ambulatory Visit (INDEPENDENT_AMBULATORY_CARE_PROVIDER_SITE_OTHER): Payer: Medicare Other | Admitting: Physician Assistant

## 2014-11-17 ENCOUNTER — Encounter: Payer: Self-pay | Admitting: Physician Assistant

## 2014-11-17 VITALS — BP 165/80 | HR 72 | Ht 71.0 in | Wt 226.0 lb

## 2014-11-17 DIAGNOSIS — K2100 Gastro-esophageal reflux disease with esophagitis, without bleeding: Secondary | ICD-10-CM

## 2014-11-17 DIAGNOSIS — I25119 Atherosclerotic heart disease of native coronary artery with unspecified angina pectoris: Secondary | ICD-10-CM

## 2014-11-17 DIAGNOSIS — Z8719 Personal history of other diseases of the digestive system: Secondary | ICD-10-CM

## 2014-11-17 DIAGNOSIS — R1310 Dysphagia, unspecified: Secondary | ICD-10-CM | POA: Diagnosis not present

## 2014-11-17 MED ORDER — OMEPRAZOLE 40 MG PO CPDR: 40.0000 mg | DELAYED_RELEASE_CAPSULE | Freq: Every day | ORAL | Status: DC

## 2014-11-17 NOTE — Progress Notes (Signed)
Patient ID: Robert Clayton, male   DOB: 11-29-1930, 81 y.o.   MRN: [id number]   Subjective:    Patient ID: Robert Clayton, male    DOB: Aug 21, 1930, 80 y.o.   MRN: [id number]  HPI  Robert Clayton  Is a pleasant 81 year old white male previously known to Dr. Deatra Ina referred today by Dr. Ronnald Ramp for evaluation of dysphagia , GERD and belching. Patient had undergone an EGD in January 2012 and was found to have a distal esophageal stricture which was Venia Minks dilated to 18 mm.   Patient has history of adult-onset diabetes mellitus, hyperlipidemia, hypertensive and , coronary artery disease , mild dementia.  He says he has no difficulty swallowing liquids but any solid foods at this point are feeling as if they are sitting in his chest for a prolonged period of time. He says he gets the sense that he needs to belch but cannot. This is happening over the past couple of months. He has not had any episodes requiring regurgitation and says she generally he can finish a meal without stopping due to dysphagia. His appetite is been good and his weight has been stable. He has not been on any chronic PPI but was just started on Prilosec 40 mg by mouth every morning last week. Fine He was concern be had [place] noted 1 episode of abdominal discomfort the day after he started Prilosec and was worried the medication had causes so he stopped it. He also has mild chronic constipation intermittently but says if he drinks prune juice this seems to manage the constipation without any difficulty. No melena or hematochezia.  Review of Systems Pertinent positive and negative review of systems were noted in the above HPI section.  All other review of systems was otherwise negative.  Outpatient Encounter Prescriptions as of 11/17/2014  Medication Sig  . allopurinol (ZYLOPRIM) 300 MG tablet TAKE ONE TABLET BY MOUTH ONCE DAILY  . aspirin EC 81 MG tablet Take 81 mg by mouth daily.  [name] atorvastatin (LIPITOR) 10 MG tablet Take 1 tablet (10 mg total)  by mouth daily.  . Dietary Management Product (VASCULERA) TABS Take 1 capsule by mouth daily.  . insulin NPH-regular Human (NOVOLIN 70/30) (70-30) 100 UNIT/ML injection Inject 40 Units into the skin 2 (two) times daily with a meal.   . levETIRAcetam (KEPPRA) 500 MG tablet Take 500 mg by mouth 2 (two) times daily.  [name] losartan (COZAAR) 100 MG tablet TAKE ONE TABLET BY MOUTH ONCE DAILY  . metoprolol (LOPRESSOR) 50 MG tablet TAKE ONE TABLET BY MOUTH TWICE DAILY  . nitroGLYCERIN (NITROSTAT) 0.4 MG SL tablet Place 1 tablet (0.4 mg total) under the tongue every 5 (five) minutes as needed. x3 doses as needed for chest pain  . omeprazole (PRILOSEC) 40 MG capsule Take 1 capsule (40 mg total) by mouth daily.  . [DISCONTINUED] omeprazole (PRILOSEC) 40 MG capsule Take 1 capsule (40 mg total) by mouth daily.  . pregabalin (LYRICA) 75 MG capsule Take 1 capsule (75 mg total) by mouth at bedtime as needed. (Patient not taking: Reported on 11/17/2014)   No facility-administered encounter medications on file as of 11/17/2014.   Allergies  Allergen Reactions  . Amlodipine Other (See Comments)    headache   Patient Active Problem List   Diagnosis Date Noted  . Venous stasis dermatitis of both lower extremities 10/31/2014  . Gastroesophageal reflux disease with esophagitis 10/31/2014  . Esophageal stricture 10/31/2014  . Lumbago 05/11/2014  . Painful diabetic neuropathy ([place]) 11/22/2013  .  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 presenium 09/13/2012  . ANEMIA, B12 DEFICIENCY 04/29/2008  . DM (diabetes mellitus), type 2, uncontrolled, periph vascular complic ([place]) [id number]  . GOUT 03/23/2008  . Hyperlipidemia with target LDL less than 70 03/13/2008  . Hypertensive renal disease 03/13/2008  . CAD, NATIVE VESSEL 03/13/2008  . ATHEROSCLEROSIS W /INT CLAUDICATION 03/13/2008   Social History   Social History  . Marital Status: Married    Spouse Name: N/A  . Number of Children: 5  . Years of Education: N/A     Occupational History  . RETIRED Robert Clayton   Social History Main Topics  . Smoking status: Never Smoker   . Smokeless tobacco: Never Used  . Alcohol Use: No  . Drug Use: No  . Sexual Activity: No   Other Topics Concern  . Not on file   Social History Narrative   DAILY CAFFEINE USE 1 DRINK PER DAY   MARRIED   5 CHILDREN   RETIRED FROM SEARS   NO ETOH OR TOBACCO USE         PATIENT SIGNED DESIGNATED PARTY RELEASE STATING HE DOES NOT WISH FOR ANYONE TO HAVE ACCESS TO HIS MEDICAL RECORDS/INFORMATION. [name] April 05, 2009 11:37 AM             Mr. Leblanc family history includes Cancer in an other family member; Diabetes in his mother.      Objective:    Filed Vitals:   11/17/14 1126  BP: 165/80  Pulse: 72    Physical Exam   Well-developed elderly African-American male in no acute distress, quite pleasant accompanied by his wife blood pressure 165/80 pulse 72 height 5 foot 11 weight 226 BMI of 31. HEENT; nontraumatic normocephalic EOMI PERRLA sclera anicteric, Cardiovascular; regular rate and rhythm with S1-S2 no murmur rub or gallop, Pulmonary; clear bilaterally, Abdomen; soft nontender nondistended bowel sounds are active there is no palpable mass or hepatosplenomegaly , Rectal; exam not done, Ext; no clubbing cyanosis or edema skin warm and dry, Neuropsych; mood and affect appropriate       Assessment & Plan:   #1 81 yo male with hx of esophageal stricture , now with 2 month hx of mild solid food dysphagia,increased GERD -suspect recurrent distal stricture #2 AODM  #3 HTN  #4 CAD #5 neuropathy  Plan; Continue Prilosec 40 mg po qam long term  Schedule for EGD with probable dilation  with Dr Havery Moros -procedure discussed in detail with pt and he is agreeable to proceed.   Amy Genia Harold PA-C 11/17/2014   Cc: Janith Lima, MD

## 2014-11-17 NOTE — Patient Instructions (Signed)
We have sent the following medications to your pharmacy for you to pick up at your convenience: Prilosec 40 mg each morning.  Drink Prune juice daily.   You have been scheduled for an endoscopy. Please follow written instructions given to you at your visit today. If you use inhalers (even only as needed), please bring them with you on the day of your procedure.

## 2014-11-17 NOTE — Progress Notes (Signed)
Agree with assessment and plan 

## 2014-11-28 ENCOUNTER — Ambulatory Visit: Payer: Medicare Other | Admitting: Internal Medicine

## 2014-12-23 ENCOUNTER — Ambulatory Visit (INDEPENDENT_AMBULATORY_CARE_PROVIDER_SITE_OTHER): Payer: Medicare Other

## 2014-12-23 DIAGNOSIS — E538 Deficiency of other specified B group vitamins: Secondary | ICD-10-CM | POA: Diagnosis not present

## 2014-12-23 MED ORDER — CYANOCOBALAMIN 1000 MCG/ML IJ SOLN
1000.0000 ug | Freq: Once | INTRAMUSCULAR | Status: AC
  Administered 2014-12-23: 1000 ug via INTRAMUSCULAR

## 2015-01-18 ENCOUNTER — Other Ambulatory Visit: Payer: Self-pay | Admitting: Internal Medicine

## 2015-01-20 ENCOUNTER — Encounter: Payer: Medicare Other | Admitting: Gastroenterology

## 2015-01-21 ENCOUNTER — Other Ambulatory Visit: Payer: Self-pay | Admitting: Endocrinology

## 2015-01-23 ENCOUNTER — Encounter: Payer: Self-pay | Admitting: Internal Medicine

## 2015-01-23 ENCOUNTER — Other Ambulatory Visit (INDEPENDENT_AMBULATORY_CARE_PROVIDER_SITE_OTHER): Payer: Medicare Other

## 2015-01-23 ENCOUNTER — Ambulatory Visit (INDEPENDENT_AMBULATORY_CARE_PROVIDER_SITE_OTHER): Payer: Medicare Other | Admitting: Internal Medicine

## 2015-01-23 VITALS — BP 160/80 | HR 58 | Temp 98.2°F | Ht 71.0 in | Wt 220.0 lb

## 2015-01-23 DIAGNOSIS — E1165 Type 2 diabetes mellitus with hyperglycemia: Secondary | ICD-10-CM

## 2015-01-23 DIAGNOSIS — I25110 Atherosclerotic heart disease of native coronary artery with unstable angina pectoris: Secondary | ICD-10-CM

## 2015-01-23 DIAGNOSIS — R072 Precordial pain: Secondary | ICD-10-CM | POA: Diagnosis not present

## 2015-01-23 DIAGNOSIS — E1151 Type 2 diabetes mellitus with diabetic peripheral angiopathy without gangrene: Secondary | ICD-10-CM

## 2015-01-23 DIAGNOSIS — I129 Hypertensive chronic kidney disease with stage 1 through stage 4 chronic kidney disease, or unspecified chronic kidney disease: Secondary | ICD-10-CM | POA: Diagnosis not present

## 2015-01-23 DIAGNOSIS — I70213 Atherosclerosis of native arteries of extremities with intermittent claudication, bilateral legs: Secondary | ICD-10-CM

## 2015-01-23 DIAGNOSIS — Z23 Encounter for immunization: Secondary | ICD-10-CM

## 2015-01-23 DIAGNOSIS — IMO0002 Reserved for concepts with insufficient information to code with codable children: Secondary | ICD-10-CM

## 2015-01-23 DIAGNOSIS — D518 Other vitamin B12 deficiency anemias: Secondary | ICD-10-CM | POA: Diagnosis not present

## 2015-01-23 LAB — BASIC METABOLIC PANEL
BUN: 22 mg/dL (ref 6–23)
CHLORIDE: 115 meq/L — AB (ref 96–112)
CO2: 21 meq/L (ref 19–32)
Calcium: 9.1 mg/dL (ref 8.4–10.5)
Creatinine, Ser: 2.06 mg/dL — ABNORMAL HIGH (ref 0.40–1.50)
GFR: 39.95 mL/min — AB (ref >60.00)
GLUCOSE: 216 mg/dL — AB (ref 70–99)
POTASSIUM: 4.6 meq/L (ref 3.5–5.1)
SODIUM: 147 meq/L — AB (ref 135–145)

## 2015-01-23 LAB — CARDIAC PANEL
CK MB: 4.4 ng/mL — AB (ref 0.3–4.0)
CK TOTAL: 114 U/L (ref 7–232)
RELATIVE INDEX: 3.9 calc — AB (ref 0.0–2.5)

## 2015-01-23 LAB — TROPONIN I: TNIDX: 0.09 ug/l — ABNORMAL HIGH (ref 0.00–0.06)

## 2015-01-23 LAB — HEMOGLOBIN A1C: Hgb A1c MFr Bld: 8.1 % — ABNORMAL HIGH (ref 4.6–6.5)

## 2015-01-23 MED ORDER — ASPIRIN EC 81 MG PO TBEC: 81.0000 mg | DELAYED_RELEASE_TABLET | Freq: Every day | ORAL | Status: DC

## 2015-01-23 MED ORDER — ISOSORBIDE MONONITRATE ER 30 MG PO TB24: 30.0000 mg | ORAL_TABLET | Freq: Every day | ORAL | Status: DC

## 2015-01-23 MED ORDER — CYANOCOBALAMIN 1000 MCG/ML IJ SOLN
1000.0000 ug | Freq: Once | INTRAMUSCULAR | Status: AC
  Administered 2015-01-23: 1000 ug via INTRAMUSCULAR

## 2015-01-23 NOTE — Progress Notes (Signed)
Subjective:  Patient ID: Robert Clayton, male    DOB: Sep 18, 1930  Age: 82 y.o. MRN: [id number]  CC: Diabetes   HPI Robert Clayton presents for follow-up, he complains of a 3 week history of substernal chest pain that occurs at rest and with exertion. He has been using oral nitroglycerin and says he controls the pain. He is concerned because he is using his nitroglycerin more frequently than before, approximately every 2-3 days. He was previously placed on oral long-acting nitroglycerin and he wants to restart that. He also complains of fatigue but no diaphoresis, dyspnea on exertion, edema, dizziness, or syncope.  Outpatient Prescriptions Prior to Visit  Medication Sig Dispense Refill  . allopurinol (ZYLOPRIM) 300 MG tablet TAKE ONE TABLET BY MOUTH ONCE DAILY 90 tablet 1  . atorvastatin (LIPITOR) 10 MG tablet Take 1 tablet (10 mg total) by mouth daily. 90 tablet 3  . Dietary Management Product (VASCULERA) TABS Take 1 capsule by mouth daily. 30 tablet 11  . levETIRAcetam (KEPPRA) 500 MG tablet Take 500 mg by mouth 2 (two) times daily.    [name] losartan (COZAAR) 100 MG tablet TAKE ONE TABLET BY MOUTH ONCE DAILY 90 tablet 3  . metoprolol (LOPRESSOR) 50 MG tablet TAKE ONE TABLET BY MOUTH TWICE DAILY 180 tablet 3  . nitroGLYCERIN (NITROSTAT) 0.4 MG SL tablet Place 1 tablet (0.4 mg total) under the tongue every 5 (five) minutes as needed. x3 doses as needed for chest pain 30 tablet 0  . NOVOLIN 70/30 (70-30) 100 UNIT/ML injection INJECT 180 UNITS INTO SKIN DAILY AT BREAKFAST 60 mL 1  . omeprazole (PRILOSEC) 40 MG capsule TAKE 1 CAPSULE EVERY DAY 30 capsule 11  . pregabalin (LYRICA) 75 MG capsule Take 1 capsule (75 mg total) by mouth at bedtime as needed. 90 capsule 1  . aspirin EC 81 MG tablet Take 81 mg by mouth daily.     No facility-administered medications prior to visit.    ROS Review of Systems  Constitutional: Positive for fatigue. Negative for fever, chills, diaphoresis, appetite change and  unexpected weight change.  HENT: Negative.   Eyes: Negative.   Respiratory: Negative.  Negative for cough, choking, chest tightness, shortness of breath and stridor.   Cardiovascular: Positive for chest pain. Negative for palpitations and leg swelling.  Gastrointestinal: Negative.  Negative for nausea, vomiting, abdominal pain, diarrhea, constipation and blood in stool.  Endocrine: Negative.   Genitourinary: Negative.  Negative for difficulty urinating.  Musculoskeletal: Negative.  Negative for myalgias and arthralgias.  Skin: Negative.  Negative for rash.  Allergic/Immunologic: Negative.   Neurological: Negative.  Negative for dizziness, weakness and light-headedness.  Hematological: Negative.  Negative for adenopathy. Does not bruise/bleed easily.  Psychiatric/Behavioral: Negative.     Objective:  BP 160/80 mmHg  Pulse 58  Temp(Src) 98.2 F (36.8 C) (Oral)  Ht 5\' 11"  (1.803 m)  Wt 220 lb (99.791 kg)  BMI 30.70 kg/m2  SpO2 99%  BP Readings from Last 3 Encounters:  01/23/15 160/80  11/17/14 165/80  10/31/14 142/58    Wt Readings from Last 3 Encounters:  01/23/15 220 lb (99.791 kg)  11/17/14 226 lb (102.513 kg)  10/31/14 223 lb (101.152 kg)    Physical Exam  Constitutional: He is oriented to person, place, and time. He appears well-developed and well-nourished. No distress.  HENT:  Head: Normocephalic and atraumatic.  Mouth/Throat: Oropharynx is clear and moist. No oropharyngeal exudate.  Eyes: Conjunctivae are normal. Right eye exhibits no discharge. Left eye exhibits no discharge.  No scleral icterus.  Neck: Normal range of motion. Neck supple. No JVD present. No tracheal deviation present. No thyromegaly present.  Cardiovascular: Normal rate, regular rhythm, normal heart sounds and intact distal pulses.  Exam reveals no gallop and no friction rub.   No murmur heard. EKG-  Normal sinus rhythm, new voltage loss and possible Q waves in 3 and aVF, these appear new  compared to an EKG done about 5 months ago, he has early repolarization in the anterior leads which is unchanged.  Pulmonary/Chest: Effort normal and breath sounds normal. No stridor. No respiratory distress. He has no wheezes. He has no rales. He exhibits no tenderness.  Abdominal: Soft. Bowel sounds are normal. He exhibits no distension and no mass. There is no tenderness. There is no rebound and no guarding.  Musculoskeletal: Normal range of motion. He exhibits no edema or tenderness.  Lymphadenopathy:    He has no cervical adenopathy.  Neurological: He is oriented to person, place, and time.  Skin: Skin is warm and dry. No rash noted. He is not diaphoretic. No erythema. No pallor.  Vitals reviewed.   Lab Results  Component Value Date   WBC 8.0 05/09/2014   HGB 13.0 05/09/2014   HCT 39.3 05/09/2014   PLT 89* 05/09/2014   GLUCOSE 216* 01/23/2015   CHOL 157 07/25/2014   TRIG 198.0* 07/25/2014   HDL 29.50* 07/25/2014   LDLDIRECT 80.6 06/26/2010   LDLCALC 88 07/25/2014   ALT 17 09/21/2013   AST 18 09/21/2013   NA 147* 01/23/2015   K 4.6 01/23/2015   CL 115* 01/23/2015   CREATININE 2.06* 01/23/2015   BUN 22 01/23/2015   CO2 21 01/23/2015   TSH 1.66 07/25/2014   PSA 0.03* 06/29/2009   INR 1.1 04/08/2008   HGBA1C 8.1* 01/23/2015   MICROALBUR 5.6* 03/23/2014    Dg Lumbar Spine Complete  05/11/2014  CLINICAL DATA:  Five 6 years of low back pain, patient has sustained a fall yesterday ; history of diabetes, prostate malignancy. EXAM: LUMBAR SPINE - COMPLETE 4+ VIEW COMPARISON:  Coronal and sagittal reconstructed images from an abdominal and pelvic CT scan dated April 30, 2013 FINDINGS: The lumbar vertebral bodies are preserved in height. The L4-5 disc space exhibits mild narrowing. There is facet joint hypertrophy at L4-5 and at L5-S1. There is no spondylolisthesis. The pedicles and transverse processes are intact. The observed portions of the sacrum exhibit no acute abnormalities.  IMPRESSION: There is no acute compression fracture. There is no dislocation. There is mild degenerative disc space narrowing at L4-5 with facet joint hypertrophy at L4-5 and L5-S1. Electronically Signed   By: David  Martinique M.D.   On: 05/11/2014 16:12    Assessment & Plan:   Adarryl was seen today for diabetes.  Diagnoses and all orders for this visit:  Precordial chest pain- see below -     EKG 12-Lead -     Myocardial Perfusion Imaging; Future -     Troponin I; Future -     Cardiac panel; Future -     isosorbide mononitrate (IMDUR) 30 MG 24 hr tablet; Take 1 tablet (30 mg total) by mouth daily.  Atherosclerosis of native coronary artery of native heart with unstable angina pectoris ([place])- he has worsening chest pain over the last 3 weeks that is responding to nitroglycerin, will change to oral nitroglycerin, he will continue to use sublingual nitroglycerin as needed, his EKG appears changed according to my review and there are slight elevations in  his CK-MB as well as his troponin, I have ordered a Lexiscan to evaluate this further. If he develops any worsening symptoms he will report to the emergency room. -     Myocardial Perfusion Imaging; Future -     Troponin I; Future -     Cardiac panel; Future -     isosorbide mononitrate (IMDUR) 30 MG 24 hr tablet; Take 1 tablet (30 mg total) by mouth daily. -     aspirin EC 81 MG tablet; Take 1 tablet (81 mg total) by mouth daily.  Atherosclerosis of native artery of both lower extremities with intermittent claudication ([facility]) -     aspirin EC 81 MG tablet; Take 1 tablet (81 mg total) by mouth daily.  ANEMIA, B12 DEFICIENCY -     cyanocobalamin ((VITAMIN B-12)) injection 1,000 mcg; Inject 1 mL (1,000 mcg total) into the muscle once.  Hypertensive renal disease, stage 1-4 or unspecified chronic kidney disease ([place])- his blood pressure is not adequately well controlled and he has a slight increase in his creatinine indicating a slight decrease in  his renal function, I will evaluate this after his cardiac situation is evaluated. -     Basic metabolic panel; Future  DM (diabetes mellitus), type 2, uncontrolled, periph vascular complic ([place])- his blood sugars are not well-controlled, will work on this after his cardiac situation is evaluated -     Basic metabolic panel; Future -     Hemoglobin A1c; Future  Encounter for immunization  Other orders -     Flu Vaccine QUAD 36+ mos IM  I have changed Mr. Saggio aspirin EC. I am also having him start on isosorbide mononitrate. Additionally, I am having him maintain his levETIRAcetam, metoprolol, pregabalin, losartan, allopurinol, atorvastatin, nitroGLYCERIN, VASCULERA, omeprazole, NOVOLIN 70/30, and B-D INS SYR ULTRAFINE 1CC/30G. We administered cyanocobalamin.  Meds ordered this encounter  Medications  . B-D INS SYR ULTRAFINE 1CC/30G 30G X 1/2" 1 ML MISC    Sig: 2 (two) times daily.    Refill:  10  . isosorbide mononitrate (IMDUR) 30 MG 24 hr tablet    Sig: Take 1 tablet (30 mg total) by mouth daily.    Dispense:  30 tablet    Refill:  5  . aspirin EC 81 MG tablet    Sig: Take 1 tablet (81 mg total) by mouth daily.    Dispense:  90 tablet    Refill:  3  . cyanocobalamin ((VITAMIN B-12)) injection 1,000 mcg    Sig:      Follow-up: Return in about 3 weeks (around 02/13/2015).  [name], MD

## 2015-01-23 NOTE — Patient Instructions (Signed)
Nonspecific Chest Pain  °Chest pain can be caused by many different conditions. There is always a chance that your pain could be related to something serious, such as a heart attack or a blood clot in your lungs. Chest pain can also be caused by conditions that are not life-threatening. If you have chest pain, it is very important to follow up with your health care provider. °CAUSES  °Chest pain can be caused by: °· Heartburn. °· Pneumonia or bronchitis. °· Anxiety or stress. °· Inflammation around your heart (pericarditis) or lung (pleuritis or pleurisy). °· A blood clot in your lung. °· A collapsed lung (pneumothorax). It can develop suddenly on its own (spontaneous pneumothorax) or from trauma to the chest. °· Shingles infection (varicella-zoster virus). °· Heart attack. °· Damage to the bones, muscles, and cartilage that make up your chest wall. This can include: °¨ Bruised bones due to injury. °¨ Strained muscles or cartilage due to frequent or repeated coughing or overwork. °¨ Fracture to one or more ribs. °¨ Sore cartilage due to inflammation (costochondritis). °RISK FACTORS  °Risk factors for chest pain may include: °· Activities that increase your risk for trauma or injury to your chest. °· Respiratory infections or conditions that cause frequent coughing. °· Medical conditions or overeating that can cause heartburn. °· Heart disease or family history of heart disease. °· Conditions or health behaviors that increase your risk of developing a blood clot. °· Having had chicken pox (varicella zoster). °SIGNS AND SYMPTOMS °Chest pain can feel like: °· Burning or tingling on the surface of your chest or deep in your chest. °· Crushing, pressure, aching, or squeezing pain. °· Dull or sharp pain that is worse when you move, cough, or take a deep breath. °· Pain that is also felt in your back, neck, shoulder, or arm, or pain that spreads to any of these areas. °Your chest pain may come and go, or it may stay  constant. °DIAGNOSIS °Lab tests or other studies may be needed to find the cause of your pain. Your health care provider may have you take a test called an ambulatory ECG (electrocardiogram). An ECG records your heartbeat patterns at the time the test is performed. You may also have other tests, such as: °· Transthoracic echocardiogram (TTE). During echocardiography, sound waves are used to create a picture of all of the heart structures and to look at how blood flows through your heart. °· Transesophageal echocardiogram (TEE). This is a more advanced imaging test that obtains images from inside your body. It allows your health care provider to see your heart in finer detail. °· Cardiac monitoring. This allows your health care provider to monitor your heart rate and rhythm in real time. °· Holter monitor. This is a portable device that records your heartbeat and can help to diagnose abnormal heartbeats. It allows your health care provider to track your heart activity for several days, if needed. °· Stress tests. These can be done through exercise or by taking medicine that makes your heart beat more quickly. °· Blood tests. °· Imaging tests. °TREATMENT  °Your treatment depends on what is causing your chest pain. Treatment may include: °· Medicines. These may include: °¨ Acid blockers for heartburn. °¨ Anti-inflammatory medicine. °¨ Pain medicine for inflammatory conditions. °¨ Antibiotic medicine, if an infection is present. °¨ Medicines to dissolve blood clots. °¨ Medicines to treat coronary artery disease. °· Supportive care for conditions that do not require medicines. This may include: °¨ Resting. °¨ Applying heat   or cold packs to injured areas. °¨ Limiting activities until pain decreases. °HOME CARE INSTRUCTIONS °· If you were prescribed an antibiotic medicine, finish it all even if you start to feel better. °· Avoid any activities that bring on chest pain. °· Do not use any tobacco products, including  cigarettes, chewing tobacco, or electronic cigarettes. If you need help quitting, ask your health care provider. °· Do not drink alcohol. °· Take medicines only as directed by your health care provider. °· Keep all follow-up visits as directed by your health care provider. This is important. This includes any further testing if your chest pain does not go away. °· If heartburn is the cause for your chest pain, you may be told to keep your head raised (elevated) while sleeping. This reduces the chance that acid will go from your stomach into your esophagus. °· Make lifestyle changes as directed by your health care provider. These may include: °¨ Getting regular exercise. Ask your health care provider to suggest some activities that are safe for you. °¨ Eating a heart-healthy diet. A registered dietitian can help you to learn healthy eating options. °¨ Maintaining a healthy weight. °¨ Managing diabetes, if necessary. °¨ Reducing stress. °SEEK MEDICAL CARE IF: °· Your chest pain does not go away after treatment. °· You have a rash with blisters on your chest. °· You have a fever. °SEEK IMMEDIATE MEDICAL CARE IF:  °· Your chest pain is worse. °· You have an increasing cough, or you cough up blood. °· You have severe abdominal pain. °· You have severe weakness. °· You faint. °· You have chills. °· You have sudden, unexplained chest discomfort. °· You have sudden, unexplained discomfort in your arms, back, neck, or jaw. °· You have shortness of breath at any time. °· You suddenly start to sweat, or your skin gets clammy. °· You feel nauseous or you vomit. °· You suddenly feel light-headed or dizzy. °· Your heart begins to beat quickly, or it feels like it is skipping beats. °These symptoms may represent a serious problem that is an emergency. Do not wait to see if the symptoms will go away. Get medical help right away. Call your local emergency services (911 in the [place] Do not drive yourself to the hospital. °  °This  information is not intended to replace advice given to you by your health care provider. Make sure you discuss any questions you have with your health care provider. °  °Document Released: 10/03/2004 Document Revised: 01/14/2014 Document Reviewed: 07/30/2013 °Elsevier Interactive Patient Education ©2016 [employer] ° °

## 2015-01-23 NOTE — Progress Notes (Signed)
Pre visit review using our clinic review tool, if applicable. No additional management support is needed unless otherwise documented below in the visit note.mh

## 2015-01-27 ENCOUNTER — Ambulatory Visit (INDEPENDENT_AMBULATORY_CARE_PROVIDER_SITE_OTHER): Payer: Medicare Other | Admitting: Endocrinology

## 2015-01-27 VITALS — BP 122/78 | HR 68 | Temp 97.9°F | Ht 71.0 in | Wt 220.0 lb

## 2015-01-27 DIAGNOSIS — E1151 Type 2 diabetes mellitus with diabetic peripheral angiopathy without gangrene: Secondary | ICD-10-CM

## 2015-01-27 DIAGNOSIS — E1165 Type 2 diabetes mellitus with hyperglycemia: Secondary | ICD-10-CM

## 2015-01-27 DIAGNOSIS — I70213 Atherosclerosis of native arteries of extremities with intermittent claudication, bilateral legs: Secondary | ICD-10-CM | POA: Diagnosis not present

## 2015-01-27 DIAGNOSIS — IMO0002 Reserved for concepts with insufficient information to code with codable children: Secondary | ICD-10-CM
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
## 2015-01-27 NOTE — Patient Instructions (Addendum)
check your blood sugar twice a day.  vary the time of day when you check, between before the 3 meals, and at bedtime.  also check if you have symptoms of your blood sugar being too high or too low.  please keep a record of the readings and bring it to your next appointment here.  please call us sooner if your blood sugar goes below 70, or if you have a lot of readings over 200.   Please come back for a follow-up appointment in 3 months.  Please increase the insulin to 50 units with breakfast, and 35 units with supper.  Please take these numbers no matter what your blood sugar is.   On this type of insulin schedule, you should eat meals on a regular schedule.  If a meal is missed or significantly delayed, your blood sugar could go low.

## 2015-01-31 ENCOUNTER — Ambulatory Visit: Payer: Medicare Other | Admitting: Internal Medicine

## 2015-01-31 ENCOUNTER — Telehealth (HOSPITAL_COMMUNITY): Payer: Self-pay | Admitting: *Deleted

## 2015-01-31 NOTE — Telephone Encounter (Signed)
Patient given detailed instructions per Myocardial Perfusion Study Information Sheet for the test on 02/02/15 at 1000. Patient notified to arrive 15 minutes early and that it is imperative to arrive on time for appointment to keep from having the test rescheduled.  If you need to cancel or reschedule your appointment, please call the office within 24 hours of your appointment. Failure to do so may result in a cancellation of your appointment, and a $50 no show fee. Patient verbalized understanding.Kameryn Tisdel, Ranae Palms

## 2015-02-02 ENCOUNTER — Encounter (HOSPITAL_COMMUNITY): Payer: Medicare Other

## 2015-02-16 ENCOUNTER — Other Ambulatory Visit: Payer: Self-pay | Admitting: *Deleted

## 2015-02-16 MED ORDER — ALLOPURINOL 300 MG PO TABS: 300.0000 mg | ORAL_TABLET | Freq: Every day | ORAL | Status: DC

## 2015-02-16 MED ORDER — METOPROLOL TARTRATE 50 MG PO TABS: 50.0000 mg | ORAL_TABLET | Freq: Two times a day (BID) | ORAL | Status: DC

## 2015-02-23 ENCOUNTER — Ambulatory Visit (INDEPENDENT_AMBULATORY_CARE_PROVIDER_SITE_OTHER): Payer: Medicare Other | Admitting: Internal Medicine

## 2015-02-23 VITALS — BP 140/80 | HR 72 | Temp 98.5°F | Ht 71.0 in | Wt 218.0 lb

## 2015-02-23 DIAGNOSIS — I70211 Atherosclerosis of native arteries of extremities with intermittent claudication, right leg: Secondary | ICD-10-CM

## 2015-02-23 DIAGNOSIS — Z23 Encounter for immunization: Secondary | ICD-10-CM | POA: Diagnosis not present

## 2015-02-23 DIAGNOSIS — D518 Other vitamin B12 deficiency anemias: Secondary | ICD-10-CM | POA: Diagnosis not present

## 2015-02-23 MED ORDER — CYANOCOBALAMIN 1000 MCG/ML IJ SOLN
1000.0000 ug | Freq: Once | INTRAMUSCULAR | Status: AC
  Administered 2015-02-23: 1000 ug via INTRAMUSCULAR

## 2015-02-23 NOTE — Progress Notes (Signed)
Pre visit review using our clinic review tool, if applicable. No additional management support is needed unless otherwise documented below in the visit note. 

## 2015-02-25 NOTE — Progress Notes (Signed)
Subjective:  Patient ID: Robert Clayton, male    DOB: 1930-05-28  Age: 82 y.o. MRN: [id number]  CC: No chief complaint on file.  He left before being seen   HPI Ryota Lorenzen presents for   Outpatient Prescriptions Prior to Visit  Medication Sig Dispense Refill  . allopurinol (ZYLOPRIM) 300 MG tablet Take 1 tablet (300 mg total) by mouth daily. 90 tablet 1  . aspirin EC 81 MG tablet Take 1 tablet (81 mg total) by mouth daily. 90 tablet 3  . atorvastatin (LIPITOR) 10 MG tablet Take 1 tablet (10 mg total) by mouth daily. 90 tablet 3  . B-D INS SYR ULTRAFINE 1CC/30G 30G X 1/2" 1 ML MISC 2 (two) times daily.  10  . Dietary Management Product (VASCULERA) TABS Take 1 capsule by mouth daily. 30 tablet 11  . Insulin NPH Human, Isophane, (NOVOLIN N RELION [place]) Inject into the skin. 50 units with breakfast, and 35 units with supper    . isosorbide mononitrate (IMDUR) 30 MG 24 hr tablet Take 1 tablet (30 mg total) by mouth daily. 30 tablet 5  . levETIRAcetam (KEPPRA) 500 MG tablet Take 500 mg by mouth 2 (two) times daily.    [name] losartan (COZAAR) 100 MG tablet TAKE ONE TABLET BY MOUTH ONCE DAILY 90 tablet 3  . metoprolol (LOPRESSOR) 50 MG tablet Take 1 tablet (50 mg total) by mouth 2 (two) times daily. 180 tablet 1  . nitroGLYCERIN (NITROSTAT) 0.4 MG SL tablet Place 1 tablet (0.4 mg total) under the tongue every 5 (five) minutes as needed. x3 doses as needed for chest pain 30 tablet 0  . omeprazole (PRILOSEC) 40 MG capsule TAKE 1 CAPSULE EVERY DAY 30 capsule 11  . pregabalin (LYRICA) 75 MG capsule Take 1 capsule (75 mg total) by mouth at bedtime as needed. 90 capsule 1   No facility-administered medications prior to visit.    ROS Review of Systems  Objective:  BP 140/80 mmHg  Pulse 72  Temp(Src) 98.5 F (36.9 C) (Oral)  Ht 5\' 11"  (1.803 m)  Wt 218 lb (98.884 kg)  BMI 30.42 kg/m2  SpO2 98%  BP Readings from Last 3 Encounters:  02/23/15 140/80  01/27/15 122/78  01/23/15 160/80    Wt  Readings from Last 3 Encounters:  02/23/15 218 lb (98.884 kg)  01/27/15 220 lb (99.791 kg)  01/23/15 220 lb (99.791 kg)    Physical Exam  Lab Results  Component Value Date   WBC 8.0 05/09/2014   HGB 13.0 05/09/2014   HCT 39.3 05/09/2014   PLT 89* 05/09/2014   GLUCOSE 216* 01/23/2015   CHOL 157 07/25/2014   TRIG 198.0* 07/25/2014   HDL 29.50* 07/25/2014   LDLDIRECT 80.6 06/26/2010   LDLCALC 88 07/25/2014   ALT 17 09/21/2013   AST 18 09/21/2013   NA 147* 01/23/2015   K 4.6 01/23/2015   CL 115* 01/23/2015   CREATININE 2.06* 01/23/2015   BUN 22 01/23/2015   CO2 21 01/23/2015   TSH 1.66 07/25/2014   PSA 0.03* 06/29/2009   INR 1.1 04/08/2008   HGBA1C 8.1* 01/23/2015   MICROALBUR 5.6* 03/23/2014    Dg Lumbar Spine Complete  05/11/2014  CLINICAL DATA:  Five 6 years of low back pain, patient has sustained a fall yesterday ; history of diabetes, prostate malignancy. EXAM: LUMBAR SPINE - COMPLETE 4+ VIEW COMPARISON:  Coronal and sagittal reconstructed images from an abdominal and pelvic CT scan dated April 30, 2013 FINDINGS: The lumbar vertebral bodies  are preserved in height. The L4-5 disc space exhibits mild narrowing. There is facet joint hypertrophy at L4-5 and at L5-S1. There is no spondylolisthesis. The pedicles and transverse processes are intact. The observed portions of the sacrum exhibit no acute abnormalities. IMPRESSION: There is no acute compression fracture. There is no dislocation. There is mild degenerative disc space narrowing at L4-5 with facet joint hypertrophy at L4-5 and L5-S1. Electronically Signed   By: David  Martinique M.D.   On: 05/11/2014 16:12    Assessment & Plan:   Diagnoses and all orders for this visit:  ANEMIA, B12 DEFICIENCY -     cyanocobalamin ((VITAMIN B-12)) injection 1,000 mcg; Inject 1 mL (1,000 mcg total) into the muscle once.  Need for 23-polyvalent pneumococcal polysaccharide vaccine -     Pneumococcal polysaccharide vaccine 23-valent greater  than or equal to 2yo subcutaneous/IM   I am having Mr. Thaker maintain his levETIRAcetam, pregabalin, losartan, atorvastatin, nitroGLYCERIN, VASCULERA, omeprazole, B-D INS SYR ULTRAFINE 1CC/30G, isosorbide mononitrate, aspirin EC, (Insulin NPH Human, Isophane, (NOVOLIN N RELION [place])), metoprolol, and allopurinol. We administered cyanocobalamin.  Meds ordered this encounter  Medications  . cyanocobalamin ((VITAMIN B-12)) injection 1,000 mcg    Sig:      Follow-up: No Follow-up on file.  [name], MD

## 2015-03-13 ENCOUNTER — Other Ambulatory Visit: Payer: Self-pay | Admitting: Endocrinology

## 2015-03-19 ENCOUNTER — Other Ambulatory Visit: Payer: Self-pay | Admitting: Internal Medicine

## 2015-03-23 ENCOUNTER — Ambulatory Visit (INDEPENDENT_AMBULATORY_CARE_PROVIDER_SITE_OTHER): Payer: Medicare Other

## 2015-03-23 ENCOUNTER — Telehealth: Payer: Self-pay

## 2015-03-23 DIAGNOSIS — E538 Deficiency of other specified B group vitamins: Secondary | ICD-10-CM

## 2015-03-23 MED ORDER — CYANOCOBALAMIN 1000 MCG/ML IJ SOLN
1000.0000 ug | Freq: Once | INTRAMUSCULAR | Status: AC
  Administered 2015-03-23: 1000 ug via INTRAMUSCULAR

## 2015-03-23 NOTE — Telephone Encounter (Signed)
Patient wanted to let you know that stress test you set up for him was not needed because it was gas he was experiencing----after he left you at last office visit he did experience gas which relieved pressure/pain in his chest and so he decided to cancel stress test----he has not had any other problems with gas and states he will probably just use Tums if it happens again---if tums doesn't work, he will call our office back----routing to dr Ronnald Ramp, Jul[place] Rainier.Marland KitchenMarland KitchenMarland Kitchen

## 2015-04-12 DIAGNOSIS — E119 Type 2 diabetes mellitus without complications: Secondary | ICD-10-CM | POA: Diagnosis not present

## 2015-04-12 DIAGNOSIS — L602 Onychogryphosis: Secondary | ICD-10-CM | POA: Diagnosis not present

## 2015-04-12 DIAGNOSIS — M216X1 Other acquired deformities of right foot: Secondary | ICD-10-CM | POA: Diagnosis not present

## 2015-04-12 DIAGNOSIS — I70293 Other atherosclerosis of native arteries of extremities, bilateral legs: Secondary | ICD-10-CM | POA: Diagnosis not present

## 2015-04-12 DIAGNOSIS — L84 Corns and callosities: Secondary | ICD-10-CM | POA: Diagnosis not present

## 2015-04-12 DIAGNOSIS — M216X2 Other acquired deformities of left foot: Secondary | ICD-10-CM | POA: Diagnosis not present

## 2015-04-24 ENCOUNTER — Ambulatory Visit (INDEPENDENT_AMBULATORY_CARE_PROVIDER_SITE_OTHER): Payer: Medicare Other

## 2015-04-24 DIAGNOSIS — E538 Deficiency of other specified B group vitamins: Secondary | ICD-10-CM

## 2015-04-24 MED ORDER — CYANOCOBALAMIN 1000 MCG/ML IJ SOLN
1000.0000 ug | Freq: Once | INTRAMUSCULAR | Status: AC
  Administered 2015-04-24: 1000 ug via INTRAMUSCULAR

## 2015-04-28 ENCOUNTER — Ambulatory Visit (INDEPENDENT_AMBULATORY_CARE_PROVIDER_SITE_OTHER): Payer: Medicare Other | Admitting: Endocrinology

## 2015-04-28 ENCOUNTER — Encounter: Payer: Self-pay | Admitting: Endocrinology

## 2015-04-28 VITALS — BP 136/80 | HR 78 | Temp 98.4°F | Ht 71.0 in | Wt 216.0 lb

## 2015-04-28 DIAGNOSIS — I70213 Atherosclerosis of native arteries of extremities with intermittent claudication, bilateral legs: Secondary | ICD-10-CM | POA: Diagnosis not present

## 2015-04-28 DIAGNOSIS — IMO0002 Reserved for concepts with insufficient information to code with codable children: Secondary | ICD-10-CM

## 2015-04-28 DIAGNOSIS — E1165 Type 2 diabetes mellitus with hyperglycemia: Secondary | ICD-10-CM

## 2015-04-28 DIAGNOSIS — E1151 Type 2 diabetes mellitus with diabetic peripheral angiopathy without gangrene: Secondary | ICD-10-CM | POA: Diagnosis not present

## 2015-04-28 LAB — POCT GLYCOSYLATED HEMOGLOBIN (HGB A1C): Hemoglobin A1C: 7.4

## 2015-04-28 MED ORDER — INSULIN NPH (HUMAN) (ISOPHANE) 100 UNIT/ML ~~LOC~~ SUSP: SUBCUTANEOUS | Status: DC
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
## 2015-04-28 NOTE — Patient Instructions (Addendum)
check your blood sugar twice a day.  vary the time of day when you check, between before the 3 meals, and at bedtime.  also check if you have symptoms of your blood sugar being too high or too low.  please keep a record of the readings and bring it to your next appointment here.  please call us sooner if your blood sugar goes below 70, or if you have a lot of readings over 200.   Please come back for a follow-up appointment in 4 months.  Please increase the insulin to 55 units with breakfast, and 30 units with supper.  Please take these numbers no matter what your blood sugar is.   On this type of insulin schedule, you should eat meals on a regular schedule.  If a meal is missed or significantly delayed, your blood sugar could go low.

## 2015-05-19 ENCOUNTER — Other Ambulatory Visit: Payer: Self-pay | Admitting: Internal Medicine

## 2015-05-25 ENCOUNTER — Other Ambulatory Visit: Payer: Self-pay | Admitting: Geriatric Medicine

## 2015-05-25 ENCOUNTER — Ambulatory Visit (INDEPENDENT_AMBULATORY_CARE_PROVIDER_SITE_OTHER): Payer: Medicare Other | Admitting: Geriatric Medicine

## 2015-05-25 DIAGNOSIS — E538 Deficiency of other specified B group vitamins: Secondary | ICD-10-CM

## 2015-05-25 MED ORDER — LOSARTAN POTASSIUM 100 MG PO TABS: 100.0000 mg | ORAL_TABLET | Freq: Every day | ORAL | Status: DC

## 2015-05-25 MED ORDER — CYANOCOBALAMIN 1000 MCG/ML IJ SOLN
1000.0000 ug | Freq: Once | INTRAMUSCULAR | Status: AC
  Administered 2015-05-25: 1000 ug via INTRAMUSCULAR

## 2015-06-08 ENCOUNTER — Encounter: Payer: Self-pay | Admitting: Internal Medicine

## 2015-06-08 ENCOUNTER — Other Ambulatory Visit (INDEPENDENT_AMBULATORY_CARE_PROVIDER_SITE_OTHER): Payer: Medicare Other

## 2015-06-08 ENCOUNTER — Ambulatory Visit (INDEPENDENT_AMBULATORY_CARE_PROVIDER_SITE_OTHER): Payer: Medicare Other | Admitting: Internal Medicine

## 2015-06-08 VITALS — BP 120/78 | HR 66 | Temp 98.6°F | Resp 16 | Ht 71.0 in | Wt 221.0 lb

## 2015-06-08 DIAGNOSIS — E785 Hyperlipidemia, unspecified: Secondary | ICD-10-CM | POA: Diagnosis not present

## 2015-06-08 DIAGNOSIS — IMO0002 Reserved for concepts with insufficient information to code with codable children: Secondary | ICD-10-CM

## 2015-06-08 DIAGNOSIS — I2510 Atherosclerotic heart disease of native coronary artery without angina pectoris: Secondary | ICD-10-CM

## 2015-06-08 DIAGNOSIS — E1165 Type 2 diabetes mellitus with hyperglycemia: Secondary | ICD-10-CM

## 2015-06-08 DIAGNOSIS — D518 Other vitamin B12 deficiency anemias: Secondary | ICD-10-CM

## 2015-06-08 DIAGNOSIS — E1151 Type 2 diabetes mellitus with diabetic peripheral angiopathy without gangrene: Secondary | ICD-10-CM

## 2015-06-08 DIAGNOSIS — G4700 Insomnia, unspecified: Secondary | ICD-10-CM | POA: Insufficient documentation

## 2015-06-08 DIAGNOSIS — I70211 Atherosclerosis of native arteries of extremities with intermittent claudication, right leg: Secondary | ICD-10-CM

## 2015-06-08 DIAGNOSIS — M109 Gout, unspecified: Secondary | ICD-10-CM

## 2015-06-08 DIAGNOSIS — I70213 Atherosclerosis of native arteries of extremities with intermittent claudication, bilateral legs: Secondary | ICD-10-CM

## 2015-06-08 LAB — URINALYSIS, ROUTINE W REFLEX MICROSCOPIC
Bilirubin Urine: NEGATIVE
Ketones, ur: NEGATIVE
Leukocytes, UA: NEGATIVE
NITRITE: NEGATIVE
PH: 6 (ref 5.0–8.0)
SPECIFIC GRAVITY, URINE: 1.01 (ref 1.000–1.030)
Urine Glucose: NEGATIVE
Urobilinogen, UA: 0.2 (ref 0.0–1.0)
WBC, UA: NONE SEEN (ref 0–?)

## 2015-06-08 LAB — BASIC METABOLIC PANEL
BUN: 34 mg/dL — ABNORMAL HIGH (ref 6–23)
CO2: 28 mEq/L (ref 19–32)
Calcium: 9.7 mg/dL (ref 8.4–10.5)
Chloride: 109 mEq/L (ref 96–112)
Creatinine, Ser: 2.32 mg/dL — ABNORMAL HIGH (ref 0.40–1.50)
GFR: 34.8 mL/min — AB (ref >60.00)
GLUCOSE: 117 mg/dL — AB (ref 70–99)
POTASSIUM: 4.5 meq/L (ref 3.5–5.1)
SODIUM: 144 meq/L (ref 135–145)

## 2015-06-08 LAB — CBC WITH DIFFERENTIAL/PLATELET
BASOS PCT: 0.4 % (ref 0.0–3.0)
Basophils Absolute: 0 10*3/uL (ref 0.0–0.1)
EOS ABS: 0.1 10*3/uL (ref 0.0–0.7)
Eosinophils Relative: 1.3 % (ref 0.0–5.0)
HCT: 43.3 % (ref 39.0–52.0)
HEMOGLOBIN: 14.3 g/dL (ref 13.0–17.0)
LYMPHS ABS: 2.5 10*3/uL (ref 0.7–4.0)
Lymphocytes Relative: 27.9 % (ref 12.0–46.0)
MCHC: 33 g/dL (ref 30.0–36.0)
MCV: 91.1 fl (ref 78.0–100.0)
MONO ABS: 0.8 10*3/uL (ref 0.1–1.0)
Monocytes Relative: 9.1 % (ref 3.0–12.0)
NEUTROS PCT: 61.3 % (ref 43.0–77.0)
Neutro Abs: 5.5 10*3/uL (ref 1.4–7.7)
Platelets: 110 10*3/uL — ABNORMAL LOW (ref 150.0–400.0)
RBC: 4.75 Mil/uL (ref 4.22–5.81)
RDW: 15.3 % (ref 11.5–15.5)
WBC: 9 10*3/uL (ref 4.0–10.5)

## 2015-06-08 LAB — HEMOGLOBIN A1C: HEMOGLOBIN A1C: 7.9 % — AB (ref 4.6–6.5)

## 2015-06-08 LAB — URIC ACID: URIC ACID, SERUM: 4 mg/dL (ref 4.0–7.8)

## 2015-06-08 LAB — LIPID PANEL
CHOLESTEROL: 186 mg/dL (ref 0–200)
HDL: 31.1 mg/dL — ABNORMAL LOW (ref >39.00)
LDL CALC: 119 mg/dL — AB (ref 0–99)
NONHDL: 154.44
Total CHOL/HDL Ratio: 6
Triglycerides: 175 mg/dL — ABNORMAL HIGH (ref 0.0–149.0)
VLDL: 35 mg/dL (ref 0.0–40.0)

## 2015-06-08 LAB — MICROALBUMIN / CREATININE URINE RATIO
CREATININE, U: 150.3 mg/dL
MICROALB/CREAT RATIO: 3.5 mg/g (ref 0.0–30.0)
Microalb, Ur: 5.2 mg/dL — ABNORMAL HIGH (ref 0.0–1.9)

## 2015-06-08 LAB — TSH: TSH: 1.8 u[IU]/mL (ref 0.35–4.50)

## 2015-06-08 MED ORDER — SUVOREXANT 10 MG PO TABS: 1.0000 | ORAL_TABLET | Freq: Every evening | ORAL | Status: DC | PRN

## 2015-06-08 NOTE — Progress Notes (Signed)
Subjective:  Patient ID: Robert Clayton, male    DOB: [birth year]  Age: 82 y.o. MRN: [id number]  CC: Anemia; Hyperlipidemia; Hypertension; and Diabetes   HPI Robert Clayton presents for follow-up on the above listed medical concerns. Today he complains of insomnia with difficulty falling asleep and frequent awakenings. He otherwise feels well and offers no complaints. He tells me his blood pressure has been well controlled on the combination of losartan and metoprolol. He denies recent episodes of chest pain, shortness of breath, palpitations, edema, or fatigue.  Outpatient Prescriptions Prior to Visit  Medication Sig Dispense Refill  . allopurinol (ZYLOPRIM) 300 MG tablet Take 1 tablet (300 mg total) by mouth daily. 90 tablet 1  . aspirin EC 81 MG tablet Take 1 tablet (81 mg total) by mouth daily. 90 tablet 3  . atorvastatin (LIPITOR) 10 MG tablet Take 1 tablet (10 mg total) by mouth daily. 90 tablet 3  . B-D INS SYR ULTRAFINE 1CC/30G 30G X 1/2" 1 ML MISC 2 (two) times daily.  10  . Dietary Management Product (VASCULERA) TABS Take 1 capsule by mouth daily. 30 tablet 11  . insulin NPH Human (NOVOLIN N RELION) 100 UNIT/ML injection 55 units with breakfast, and 30 units with supper 30 mL 11  . isosorbide mononitrate (IMDUR) 30 MG 24 hr tablet Take 1 tablet (30 mg total) by mouth daily. 30 tablet 5  . levETIRAcetam (KEPPRA) 500 MG tablet Take 500 mg by mouth 2 (two) times daily.    [name] losartan (COZAAR) 100 MG tablet Take 1 tablet (100 mg total) by mouth daily. 90 tablet 3  . metoprolol (LOPRESSOR) 50 MG tablet Take 1 tablet (50 mg total) by mouth 2 (two) times daily. 180 tablet 1  . nitroGLYCERIN (NITROSTAT) 0.4 MG SL tablet Place 1 tablet (0.4 mg total) under the tongue every 5 (five) minutes as needed. x3 doses as needed for chest pain 30 tablet 0  . omeprazole (PRILOSEC) 40 MG capsule TAKE 1 CAPSULE EVERY DAY 30 capsule 11  . ONETOUCH DELICA LANCETS 99991111 MISC Test 2 times daily. Dx: E11.51 100  each 5  . pregabalin (LYRICA) 75 MG capsule Take 1 capsule (75 mg total) by mouth at bedtime as needed. (Patient not taking: Reported on 06/08/2015) 90 capsule 1   No facility-administered medications prior to visit.    ROS Review of Systems  Constitutional: Negative.  Negative for fever, chills, diaphoresis, appetite change and fatigue.  HENT: Negative.   Eyes: Negative.  Negative for visual disturbance.  Respiratory: Negative.  Negative for cough, choking, chest tightness, shortness of breath and stridor.   Cardiovascular: Negative.  Negative for chest pain, palpitations and leg swelling.  Gastrointestinal: Negative.  Negative for nausea, vomiting, abdominal pain, diarrhea and constipation.  Endocrine: Negative.   Genitourinary: Negative.   Musculoskeletal: Negative.  Negative for myalgias, back pain, joint swelling, arthralgias and neck pain.  Skin: Negative.  Negative for color change and rash.  Allergic/Immunologic: Negative.   Neurological: Negative.  Negative for dizziness.  Hematological: Negative.  Negative for adenopathy. Does not bruise/bleed easily.  Psychiatric/Behavioral: Positive for sleep disturbance. Negative for suicidal ideas, self-injury, dysphoric mood and decreased concentration. The patient is not nervous/anxious.     Objective:  BP 120/78 mmHg  Pulse 66  Temp(Src) 98.6 F (37 C) (Oral)  Resp 16  Ht 5\' 11"  (1.803 m)  Wt 221 lb (100.245 kg)  BMI 30.84 kg/m2  SpO2 98%  BP Readings from Last 3 Encounters:  06/08/15 120/78  04/28/15 136/80  02/23/15 140/80    Wt Readings from Last 3 Encounters:  06/08/15 221 lb (100.245 kg)  04/28/15 216 lb (97.977 kg)  02/23/15 218 lb (98.884 kg)    Physical Exam  Constitutional: He is oriented to person, place, and time. No distress.  HENT:  Mouth/Throat: Oropharynx is clear and moist. No oropharyngeal exudate.  Eyes: Conjunctivae are normal. Right eye exhibits no discharge. Left eye exhibits no discharge. No  scleral icterus.  Neck: Normal range of motion. Neck supple. No JVD present. No tracheal deviation present. No thyromegaly present.  Cardiovascular: Normal rate, regular rhythm, normal heart sounds and intact distal pulses.  Exam reveals no gallop and no friction rub.   No murmur heard. Pulmonary/Chest: Effort normal and breath sounds normal. No stridor. No respiratory distress. He has no wheezes. He has no rales. He exhibits no tenderness.  Abdominal: Soft. Bowel sounds are normal. He exhibits no distension and no mass. There is no tenderness. There is no rebound and no guarding.  Musculoskeletal: Normal range of motion. He exhibits no edema or tenderness.  Lymphadenopathy:    He has no cervical adenopathy.  Neurological: He is oriented to person, place, and time.  Skin: Skin is warm and dry. No rash noted. He is not diaphoretic. No erythema. No pallor.  Psychiatric: He has a normal mood and affect. His behavior is normal. Judgment and thought content normal.  Vitals reviewed.   Lab Results  Component Value Date   WBC 9.0 06/08/2015   HGB 14.3 06/08/2015   HCT 43.3 06/08/2015   PLT 110.0* 06/08/2015   GLUCOSE 117* 06/08/2015   CHOL 186 06/08/2015   TRIG 175.0* 06/08/2015   HDL 31.10* 06/08/2015   LDLDIRECT 80.6 06/26/2010   LDLCALC 119* 06/08/2015   ALT 17 09/21/2013   AST 18 09/21/2013   NA 144 06/08/2015   K 4.5 06/08/2015   CL 109 06/08/2015   CREATININE 2.32* 06/08/2015   BUN 34* 06/08/2015   CO2 28 06/08/2015   TSH 1.80 06/08/2015   PSA 0.03* 06/29/2009   INR 1.1 04/08/2008   HGBA1C 7.9* 06/08/2015   MICROALBUR 5.2* 06/08/2015    Dg Lumbar Spine Complete  05/11/2014  CLINICAL DATA:  Five 6 years of low back pain, patient has sustained a fall yesterday ; history of diabetes, prostate malignancy. EXAM: LUMBAR SPINE - COMPLETE 4+ VIEW COMPARISON:  Coronal and sagittal reconstructed images from an abdominal and pelvic CT scan dated April 30, 2013 FINDINGS: The lumbar  vertebral bodies are preserved in height. The L4-5 disc space exhibits mild narrowing. There is facet joint hypertrophy at L4-5 and at L5-S1. There is no spondylolisthesis. The pedicles and transverse processes are intact. The observed portions of the sacrum exhibit no acute abnormalities. IMPRESSION: There is no acute compression fracture. There is no dislocation. There is mild degenerative disc space narrowing at L4-5 with facet joint hypertrophy at L4-5 and L5-S1. Electronically Signed   By: David  Martinique M.D.   On: 05/11/2014 16:12    Assessment & Plan:   Damarii was seen today for anemia, hyperlipidemia, hypertension and diabetes.  Diagnoses and all orders for this visit:  Atherosclerosis of native artery of right lower extremity with intermittent claudication ([place])- he is had no symptoms related to this, we'll continue to control his risk factors with statin therapy, blood pressure control, and blood sugar control. -     Lipid panel; Future  Atherosclerosis of native coronary artery of native heart without angina pectoris- he  is had no recent episodes of chest pain or shortness of breath, will continue to treat his risk factors. -     Lipid panel; Future  DM (diabetes mellitus), type 2, uncontrolled, periph vascular complic ([place])- his 123456 is 7.9%, considering his age and comorbid illnesses his blood sugars are adequately well controlled -     Basic metabolic panel; Future -     Hemoglobin A1c; Future -     Urinalysis, Routine w reflex microscopic (not at [facility]); Future -     Microalbumin / creatinine urine ratio; Future  ANEMIA, B12 DEFICIENCY- Improvement noted -     CBC with Differential/Platelet; Future  GOUT- his uric acid is below 6 which is the desired goal -     Uric acid; Future  Hyperlipidemia with target LDL less than 70- he has not achieved his LDL goal but is also not interested in being more aggressive about statin therapy or other medications to lower his LDL -     Lipid  panel; Future -     TSH; Future  Insomnia, organic- he will try Belsmra for insomnia -     Suvorexant (BELSOMRA) 10 MG TABS; Take 1 tablet by mouth at bedtime as needed.   I have discontinued Mr. Slaght pregabalin. I am also having him start on Suvorexant. Additionally, I am having him maintain his levETIRAcetam, atorvastatin, nitroGLYCERIN, VASCULERA, omeprazole, B-D INS SYR ULTRAFINE 1CC/30G, isosorbide mononitrate, aspirin EC, metoprolol, allopurinol, ONETOUCH DELICA LANCETS 99991111, insulin NPH Human, losartan, and NOVOLIN 70/30.  Meds ordered this encounter  Medications  . NOVOLIN 70/30 (70-30) 100 UNIT/ML injection    Sig:   . Suvorexant (BELSOMRA) 10 MG TABS    Sig: Take 1 tablet by mouth at bedtime as needed.    Dispense:  30 tablet    Refill:  5     Follow-up: Return in about 4 months (around 10/08/2015).  [name], MD

## 2015-06-08 NOTE — Patient Instructions (Signed)
Insomnia Insomnia is a sleep disorder that makes it difficult to fall asleep or to stay asleep. Insomnia can cause tiredness (fatigue), low energy, difficulty concentrating, mood swings, and poor performance at work or school.  There are three different ways to classify insomnia:  Difficulty falling asleep.  Difficulty staying asleep.  Waking up too early in the morning. Any type of insomnia can be long-term (chronic) or short-term (acute). Both are common. Short-term insomnia usually lasts for three months or less. Chronic insomnia occurs at least three times a week for longer than three months. CAUSES  Insomnia may be caused by another condition, situation, or substance, such as:  Anxiety.  Certain medicines.  Gastroesophageal reflux disease (GERD) or other gastrointestinal conditions.  Asthma or other breathing conditions.  Restless legs syndrome, sleep apnea, or other sleep disorders.  Chronic pain.  Menopause. This may include hot flashes.  Stroke.  Abuse of alcohol, tobacco, or illegal drugs.  Depression.  Caffeine.   Neurological disorders, such as Alzheimer disease.  An overactive thyroid (hyperthyroidism). The cause of insomnia may not be known. RISK FACTORS Risk factors for insomnia include:  Gender. Women are more commonly affected than men.  Age. Insomnia is more common as you get older.  Stress. This may involve your professional or personal life.  Income. Insomnia is more common in people with lower income.  Lack of exercise.   Irregular work schedule or night shifts.  Traveling between different time zones. SIGNS AND SYMPTOMS If you have insomnia, trouble falling asleep or trouble staying asleep is the main symptom. This may lead to other symptoms, such as:  Feeling fatigued.  Feeling nervous about going to sleep.  Not feeling rested in the morning.  Having trouble concentrating.  Feeling irritable, anxious, or depressed. TREATMENT   Treatment for insomnia depends on the cause. If your insomnia is caused by an underlying condition, treatment will focus on addressing the condition. Treatment may also include:   Medicines to help you sleep.  Counseling or therapy.  Lifestyle adjustments. HOME CARE INSTRUCTIONS   Take medicines only as directed by your health care provider.  Keep regular sleeping and waking hours. Avoid naps.  Keep a sleep diary to help you and your health care provider figure out what could be causing your insomnia. Include:   When you sleep.  When you wake up during the night.  How well you sleep.   How rested you feel the next day.  Any side effects of medicines you are taking.  What you eat and drink.   Make your bedroom a comfortable place where it is easy to fall asleep:  Put up shades or special blackout curtains to block light from outside.  Use a white noise machine to block noise.  Keep the temperature cool.   Exercise regularly as directed by your health care provider. Avoid exercising right before bedtime.  Use relaxation techniques to manage stress. Ask your health care provider to suggest some techniques that may work well for you. These may include:  Breathing exercises.  Routines to release muscle tension.  Visualizing peaceful scenes.  Cut back on alcohol, caffeinated beverages, and cigarettes, especially close to bedtime. These can disrupt your sleep.  Do not overeat or eat spicy foods right before bedtime. This can lead to digestive discomfort that can make it hard for you to sleep.  Limit screen use before bedtime. This includes:  Watching TV.  Using your smartphone, tablet, and computer.  Stick to a routine. This   can help you fall asleep faster. Try to do a quiet activity, brush your teeth, and go to bed at the same time each night.  Get out of bed if you are still awake after 15 minutes of trying to sleep. Keep the lights down, but try reading or  doing a quiet activity. When you feel sleepy, go back to bed.  Make sure that you drive carefully. Avoid driving if you feel very sleepy.  Keep all follow-up appointments as directed by your health care provider. This is important. SEEK MEDICAL CARE IF:   You are tired throughout the day or have trouble in your daily routine due to sleepiness.  You continue to have sleep problems or your sleep problems get worse. SEEK IMMEDIATE MEDICAL CARE IF:   You have serious thoughts about hurting yourself or someone else.   This information is not intended to replace advice given to you by your health care provider. Make sure you discuss any questions you have with your health care provider.   Document Released: 12/22/1999 Document Revised: 09/14/2014 Document Reviewed: 09/24/2013 Elsevier Interactive Patient Education 2016 [employer]  

## 2015-06-26 ENCOUNTER — Ambulatory Visit (INDEPENDENT_AMBULATORY_CARE_PROVIDER_SITE_OTHER): Payer: Medicare Other

## 2015-06-26 DIAGNOSIS — E538 Deficiency of other specified B group vitamins: Secondary | ICD-10-CM

## 2015-06-26 MED ORDER — CYANOCOBALAMIN 1000 MCG/ML IJ SOLN
1000.0000 ug | Freq: Once | INTRAMUSCULAR | Status: AC
  Administered 2015-06-26: 1000 ug via INTRAMUSCULAR

## 2015-07-14 ENCOUNTER — Ambulatory Visit (INDEPENDENT_AMBULATORY_CARE_PROVIDER_SITE_OTHER): Payer: Medicare Other | Admitting: *Deleted

## 2015-07-14 DIAGNOSIS — D518 Other vitamin B12 deficiency anemias: Secondary | ICD-10-CM | POA: Diagnosis not present

## 2015-07-14 MED ORDER — CYANOCOBALAMIN 1000 MCG/ML IJ SOLN
1000.0000 ug | Freq: Once | INTRAMUSCULAR | Status: AC
  Administered 2015-07-14: 1000 ug via INTRAMUSCULAR

## 2015-07-17 ENCOUNTER — Ambulatory Visit: Payer: Medicare Other

## 2015-07-21 ENCOUNTER — Ambulatory Visit: Payer: Medicare Other

## 2015-08-07 DIAGNOSIS — L602 Onychogryphosis: Secondary | ICD-10-CM | POA: Diagnosis not present

## 2015-08-07 DIAGNOSIS — E1140 Type 2 diabetes mellitus with diabetic neuropathy, unspecified: Secondary | ICD-10-CM | POA: Diagnosis not present

## 2015-08-07 DIAGNOSIS — I70293 Other atherosclerosis of native arteries of extremities, bilateral legs: Secondary | ICD-10-CM | POA: Diagnosis not present

## 2015-08-16 ENCOUNTER — Other Ambulatory Visit: Payer: Self-pay | Admitting: *Deleted

## 2015-08-16 MED ORDER — ALLOPURINOL 300 MG PO TABS: 300.0000 mg | ORAL_TABLET | Freq: Every day | ORAL | 1 refills | Status: DC

## 2015-08-16 MED ORDER — METOPROLOL TARTRATE 50 MG PO TABS: 50.0000 mg | ORAL_TABLET | Freq: Two times a day (BID) | ORAL | 1 refills | Status: DC

## 2015-08-18 ENCOUNTER — Ambulatory Visit (INDEPENDENT_AMBULATORY_CARE_PROVIDER_SITE_OTHER): Payer: Medicare Other

## 2015-08-18 DIAGNOSIS — E538 Deficiency of other specified B group vitamins: Secondary | ICD-10-CM

## 2015-08-18 MED ORDER — CYANOCOBALAMIN 1000 MCG/ML IJ SOLN
1000.0000 ug | Freq: Once | INTRAMUSCULAR | Status: AC
  Administered 2015-08-18: 1000 ug via INTRAMUSCULAR

## 2015-08-29 ENCOUNTER — Encounter: Payer: Self-pay | Admitting: Cardiovascular Disease

## 2015-08-29 ENCOUNTER — Encounter (INDEPENDENT_AMBULATORY_CARE_PROVIDER_SITE_OTHER): Payer: Self-pay

## 2015-08-29 ENCOUNTER — Ambulatory Visit (INDEPENDENT_AMBULATORY_CARE_PROVIDER_SITE_OTHER): Payer: Medicare Other | Admitting: Endocrinology

## 2015-08-29 ENCOUNTER — Ambulatory Visit (INDEPENDENT_AMBULATORY_CARE_PROVIDER_SITE_OTHER): Payer: Medicare Other | Admitting: Cardiovascular Disease

## 2015-08-29 VITALS — BP 112/60 | HR 71 | Ht 71.0 in | Wt 205.0 lb

## 2015-08-29 VITALS — BP 138/58 | HR 74 | Ht 71.5 in | Wt 206.6 lb

## 2015-08-29 DIAGNOSIS — I70213 Atherosclerosis of native arteries of extremities with intermittent claudication, bilateral legs: Secondary | ICD-10-CM

## 2015-08-29 DIAGNOSIS — R011 Cardiac murmur, unspecified: Secondary | ICD-10-CM | POA: Diagnosis not present

## 2015-08-29 DIAGNOSIS — E1151 Type 2 diabetes mellitus with diabetic peripheral angiopathy without gangrene: Secondary | ICD-10-CM | POA: Diagnosis not present

## 2015-08-29 DIAGNOSIS — I25119 Atherosclerotic heart disease of native coronary artery with unspecified angina pectoris: Secondary | ICD-10-CM | POA: Diagnosis not present

## 2015-08-29 DIAGNOSIS — E1165 Type 2 diabetes mellitus with hyperglycemia: Secondary | ICD-10-CM

## 2015-08-29 DIAGNOSIS — IMO0002 Reserved for concepts with insufficient information to code with codable children: Secondary | ICD-10-CM

## 2015-08-29 LAB — POCT GLYCOSYLATED HEMOGLOBIN (HGB A1C): HEMOGLOBIN A1C: 8.9

## 2015-08-29 MED ORDER — INSULIN NPH (HUMAN) (ISOPHANE) 100 UNIT/ML ~~LOC~~ SUSP: SUBCUTANEOUS | 11 refills | Status: DC

## 2015-08-29 NOTE — Patient Instructions (Addendum)
check your blood sugar twice a day.  vary the time of day when you check, between before the 3 meals, and at bedtime.  also check if you have symptoms of your blood sugar being too high or too low.  please keep a record of the readings and bring it to your next appointment here.  please call us sooner if your blood sugar goes below 70, or if you have a lot of readings over 200.    Please increase the insulin to 65 units with breakfast, and 30 units with supper.  Please take these numbers no matter what your blood sugar is.   On this type of insulin schedule, you should eat meals on a regular schedule.  If a meal is missed or significantly delayed, your blood sugar could go low.  Please come back for a follow-up appointment in 3 months.

## 2015-08-29 NOTE — Patient Instructions (Signed)
Medication Instructions:  Your physician recommends that you continue on your current medications as directed. Please refer to the Current Medication list given to you today.  Labwork: No new orders.   Testing/Procedures: Your physician has requested that you have an echocardiogram. Echocardiography is a painless test that uses sound waves to create images of your heart. It provides your doctor with information about the size and shape of your heart and how well your heart's chambers and valves are working. This procedure takes approximately one hour. There are no restrictions for this procedure.  Your physician has requested that you have a lexiscan myoview. For further information please visit [email or web address]. Please follow instruction sheet, as given.  Follow-Up: Your physician wants you to follow-up in: 1 YEAR with Dr Burt Knack.  You will receive a reminder letter in the mail two months in advance. If you don't receive a letter, please call our office to schedule the follow-up appointment.   Any Other Special Instructions Will Be Listed Below (If Applicable).     If you need a refill on your cardiac medications before your next appointment, please call your pharmacy.
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
## 2015-09-05 ENCOUNTER — Telehealth (HOSPITAL_COMMUNITY): Payer: Self-pay | Admitting: *Deleted

## 2015-09-05 NOTE — Telephone Encounter (Signed)
Attempted to leave message on voicemail in reference to upcoming appointment scheduled for 09/07/15. No answer and no machine available.Keigo Whalley, Ranae Palms

## 2015-09-05 NOTE — Telephone Encounter (Signed)
Patient's wife given detailed instructions per Myocardial Perfusion Study Information Sheet for the test on 09/07/15 at 0945. Patient notified to arrive 15 minutes early and that it is imperative to arrive on time for appointment to keep from having the test rescheduled.  If you need to cancel or reschedule your appointment, please call the office within 24 hours of your appointment. Failure to do so may result in a cancellation of your appointment, and a $50 no show fee. Patient verbalized understanding.Evan Osburn, Ranae Palms

## 2015-09-07 ENCOUNTER — Other Ambulatory Visit: Payer: Self-pay

## 2015-09-07 ENCOUNTER — Ambulatory Visit (HOSPITAL_COMMUNITY): Payer: Medicare Other | Attending: Cardiology

## 2015-09-07 ENCOUNTER — Ambulatory Visit (HOSPITAL_BASED_OUTPATIENT_CLINIC_OR_DEPARTMENT_OTHER): Payer: Medicare Other

## 2015-09-07 ENCOUNTER — Encounter (INDEPENDENT_AMBULATORY_CARE_PROVIDER_SITE_OTHER): Payer: Self-pay

## 2015-09-07 DIAGNOSIS — I77810 Thoracic aortic ectasia: Secondary | ICD-10-CM | POA: Diagnosis not present

## 2015-09-07 DIAGNOSIS — I25119 Atherosclerotic heart disease of native coronary artery with unspecified angina pectoris: Secondary | ICD-10-CM | POA: Diagnosis not present

## 2015-09-07 DIAGNOSIS — R011 Cardiac murmur, unspecified: Secondary | ICD-10-CM | POA: Insufficient documentation

## 2015-09-07 DIAGNOSIS — I340 Nonrheumatic mitral (valve) insufficiency: Secondary | ICD-10-CM | POA: Insufficient documentation

## 2015-09-07 DIAGNOSIS — I119 Hypertensive heart disease without heart failure: Secondary | ICD-10-CM | POA: Insufficient documentation

## 2015-09-07 DIAGNOSIS — E119 Type 2 diabetes mellitus without complications: Secondary | ICD-10-CM | POA: Diagnosis not present

## 2015-09-07 DIAGNOSIS — I351 Nonrheumatic aortic (valve) insufficiency: Secondary | ICD-10-CM | POA: Diagnosis not present

## 2015-09-07 DIAGNOSIS — R42 Dizziness and giddiness: Secondary | ICD-10-CM | POA: Insufficient documentation

## 2015-09-07 DIAGNOSIS — R079 Chest pain, unspecified: Secondary | ICD-10-CM | POA: Diagnosis not present

## 2015-09-07 DIAGNOSIS — R9439 Abnormal result of other cardiovascular function study: Secondary | ICD-10-CM | POA: Diagnosis not present

## 2015-09-07 LAB — MYOCARDIAL PERFUSION IMAGING
CHL CUP NUCLEAR SDS: 0
CHL CUP NUCLEAR SRS: 4
CHL CUP NUCLEAR SSS: 4
CHL CUP RESTING HR STRESS: 53 {beats}/min
CSEPPHR: 80 {beats}/min
LV dias vol: 88 mL (ref 62–150)
LV sys vol: 39 mL
RATE: 0.29
TID: 1.01

## 2015-09-07 MED ORDER — REGADENOSON 0.4 MG/5ML IV SOLN
0.4000 mg | Freq: Once | INTRAVENOUS | Status: AC
  Administered 2015-09-07: 0.4 mg via INTRAVENOUS

## 2015-09-07 MED ORDER — TECHNETIUM TC 99M TETROFOSMIN IV KIT
10.4000 | PACK | Freq: Once | INTRAVENOUS | Status: AC | PRN
  Administered 2015-09-07: 10 via INTRAVENOUS
  Filled 2015-09-07: qty 10

## 2015-09-07 MED ORDER — TECHNETIUM TC 99M TETROFOSMIN IV KIT
32.9000 | PACK | Freq: Once | INTRAVENOUS | Status: AC | PRN
  Administered 2015-09-07: 32.9 via INTRAVENOUS
  Filled 2015-09-07: qty 33

## 2015-09-14 ENCOUNTER — Telehealth: Payer: Self-pay | Admitting: Cardiovascular Disease

## 2015-09-14 NOTE — Telephone Encounter (Signed)
New message    Pt verbalized that he is calling for the results of his MYOCARDIAL PERFUSION

## 2015-09-14 NOTE — Telephone Encounter (Signed)
I spoke with pt and reviewed stress test results with him.  Appt made for him to see Dr. Burt Knack on 09/18/15 at 9:30

## 2015-09-15 ENCOUNTER — Encounter: Payer: Self-pay | Admitting: Cardiovascular Disease

## 2015-09-18 ENCOUNTER — Ambulatory Visit (INDEPENDENT_AMBULATORY_CARE_PROVIDER_SITE_OTHER): Payer: Medicare Other | Admitting: Cardiovascular Disease

## 2015-09-18 ENCOUNTER — Encounter: Payer: Self-pay | Admitting: Cardiovascular Disease

## 2015-09-18 VITALS — BP 120/80 | HR 54 | Ht 71.5 in | Wt 210.0 lb

## 2015-09-18 DIAGNOSIS — I70213 Atherosclerosis of native arteries of extremities with intermittent claudication, bilateral legs: Secondary | ICD-10-CM | POA: Diagnosis not present

## 2015-09-18 DIAGNOSIS — I25118 Atherosclerotic heart disease of native coronary artery with other forms of angina pectoris: Secondary | ICD-10-CM
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
## 2015-09-18 NOTE — Patient Instructions (Signed)

## 2015-09-19 ENCOUNTER — Ambulatory Visit (INDEPENDENT_AMBULATORY_CARE_PROVIDER_SITE_OTHER): Payer: Medicare Other | Admitting: General Practice

## 2015-09-19 DIAGNOSIS — E538 Deficiency of other specified B group vitamins: Secondary | ICD-10-CM

## 2015-09-19 MED ORDER — CYANOCOBALAMIN 1000 MCG/ML IJ SOLN
1000.0000 ug | Freq: Once | INTRAMUSCULAR | Status: AC
  Administered 2015-09-19: 1000 ug via INTRAMUSCULAR

## 2015-09-19 MED ORDER — CYANOCOBALAMIN 1000 MCG/ML IJ SOLN: 1000.0000 ug | Freq: Once | INTRAMUSCULAR | 0 refills | Status: DC

## 2015-10-09 ENCOUNTER — Encounter: Payer: Self-pay | Admitting: Internal Medicine

## 2015-10-09 ENCOUNTER — Ambulatory Visit (INDEPENDENT_AMBULATORY_CARE_PROVIDER_SITE_OTHER): Payer: Medicare Other | Admitting: Internal Medicine

## 2015-10-09 VITALS — BP 160/90 | HR 65 | Temp 98.0°F | Resp 16 | Ht 71.5 in | Wt 202.2 lb

## 2015-10-09 DIAGNOSIS — L03116 Cellulitis of left lower limb: Secondary | ICD-10-CM

## 2015-10-09 DIAGNOSIS — I70213 Atherosclerosis of native arteries of extremities with intermittent claudication, bilateral legs: Secondary | ICD-10-CM

## 2015-10-09 MED ORDER — DOXYCYCLINE MONOHYDRATE 100 MG PO CAPS: 100.0000 mg | ORAL_CAPSULE | Freq: Two times a day (BID) | ORAL | 0 refills | Status: AC

## 2015-10-09 NOTE — Progress Notes (Signed)
Pre visit review using our clinic review tool, if applicable. No additional management support is needed unless otherwise documented below in the visit note. 

## 2015-10-09 NOTE — Progress Notes (Signed)
Subjective:  Patient ID: Robert Clayton, male    DOB: [birth year]  Age: 82 y.o. MRN: [id number]  CC: Cellulitis   HPI Joseh Sjogren presents for concerns about his left lower extremity. He was taking out the trash about 4 days ago when something superficially struck his left leg. In the ensuing 4 days he has developed a very discrete area of redness, swelling, and discomfort. He is able to bear weight on the left lower extremity with no discomfort. He denies numbness, weakness, tingling, fever, chills, nausea, vomiting, or lymphadenopathy.  Outpatient Medications Prior to Visit  Medication Sig Dispense Refill  . allopurinol (ZYLOPRIM) 300 MG tablet Take 1 tablet (300 mg total) by mouth daily. 90 tablet 1  . aspirin EC 81 MG tablet Take 1 tablet (81 mg total) by mouth daily. 90 tablet 3  . atorvastatin (LIPITOR) 10 MG tablet Take 1 tablet (10 mg total) by mouth daily. 90 tablet 3  . B-D INS SYR ULTRAFINE 1CC/30G 30G X 1/2" 1 ML MISC 2 (two) times daily.  10  . Dietary Management Product (VASCULERA) TABS Take 1 capsule by mouth daily. 30 tablet 11  . INSULIN NPH ISOPHANE & REGULAR [place] Inject 30-65 Units into the skin 2 (two) times daily. Inject 65 units under the skin with breakfast and inject 30 units under the skin with supper    . isosorbide mononitrate (IMDUR) 30 MG 24 hr tablet Take 1 tablet (30 mg total) by mouth daily. 30 tablet 5  . levETIRAcetam (KEPPRA) 500 MG tablet Take 500 mg by mouth 2 (two) times daily.    [name] losartan (COZAAR) 100 MG tablet Take 1 tablet (100 mg total) by mouth daily. 90 tablet 3  . metoprolol (LOPRESSOR) 50 MG tablet Take 1 tablet (50 mg total) by mouth 2 (two) times daily. 180 tablet 1  . nitroGLYCERIN (NITROSTAT) 0.4 MG SL tablet Place 1 tablet (0.4 mg total) under the tongue every 5 (five) minutes as needed. x3 doses as needed for chest pain 30 tablet 0  . NOVOLIN 70/30 (70-30) 100 UNIT/ML injection Inject 30-45 Units into the skin 2 (two) times daily with a meal.      . omeprazole (PRILOSEC) 40 MG capsule Take 40 mg by mouth daily.    [name] DELICA LANCETS 69C MISC Test 2 times daily. Dx: E11.51 100 each 5   No facility-administered medications prior to visit.     ROS Review of Systems  Constitutional: Negative for appetite change, chills, diaphoresis, fatigue and fever.  HENT: Negative.   Eyes: Negative.   Respiratory: Negative.  Negative for cough, choking, chest tightness, shortness of breath and stridor.   Cardiovascular: Negative.  Negative for chest pain, palpitations and leg swelling.  Gastrointestinal: Negative.  Negative for diarrhea, nausea and vomiting.  Endocrine: Negative.   Genitourinary: Negative.   Musculoskeletal: Negative.  Negative for back pain, myalgias and neck pain.  Skin: Positive for color change and wound. Negative for pallor and rash.  Allergic/Immunologic: Negative.   Neurological: Negative.   Hematological: Negative.  Negative for adenopathy. Does not bruise/bleed easily.  Psychiatric/Behavioral: Negative.     Objective:  BP (!) 160/90 (BP Location: Left Arm, Patient Position: Sitting, Cuff Size: Normal)   Pulse 65   Temp 98 F (36.7 C) (Oral)   Resp 16   Ht 5' 11.5" (1.816 m)   Wt 202 lb 4 oz (91.7 kg)   SpO2 98%   BMI 27.82 kg/m   BP Readings from Last 3 Encounters:  10/09/15 (!) 160/90  09/18/15 120/80  08/29/15 (!) 138/58    Wt Readings from Last 3 Encounters:  10/09/15 202 lb 4 oz (91.7 kg)  09/18/15 210 lb (95.3 kg)  09/07/15 206 lb (93.4 kg)    Physical Exam  Constitutional: He is oriented to person, place, and time. No distress.  HENT:  Mouth/Throat: Oropharynx is clear and moist. No oropharyngeal exudate.  Eyes: Conjunctivae are normal. Right eye exhibits no discharge. Left eye exhibits no discharge. No scleral icterus.  Neck: Normal range of motion. Neck supple. No JVD present. No tracheal deviation present. No thyromegaly present.  Cardiovascular: Normal rate, regular rhythm,  normal heart sounds and intact distal pulses.  Exam reveals no gallop and no friction rub.   No murmur heard. Pulmonary/Chest: Effort normal and breath sounds normal. No stridor. No respiratory distress. He has no wheezes. He has no rales. He exhibits no tenderness.  Abdominal: Soft. Bowel sounds are normal. He exhibits no distension and no mass. There is no tenderness. There is no rebound and no guarding.  Musculoskeletal: Normal range of motion. He exhibits no edema or tenderness.       Left lower leg: He exhibits swelling and deformity. He exhibits no tenderness, no bony tenderness, no edema and no laceration.       Legs: Lymphadenopathy:    He has no cervical adenopathy.  Neurological: He is oriented to person, place, and time.  Skin: Skin is warm and dry. No rash noted. He is not diaphoretic. There is erythema. No pallor.  Vitals reviewed.   Lab Results  Component Value Date   WBC 9.0 06/08/2015   HGB 14.3 06/08/2015   HCT 43.3 06/08/2015   PLT 110.0 (L) 06/08/2015   GLUCOSE 117 (H) 06/08/2015   CHOL 186 06/08/2015   TRIG 175.0 (H) 06/08/2015   HDL 31.10 (L) 06/08/2015   LDLDIRECT 80.6 06/26/2010   LDLCALC 119 (H) 06/08/2015   ALT 17 09/21/2013   AST 18 09/21/2013   NA 144 06/08/2015   K 4.5 06/08/2015   CL 109 06/08/2015   CREATININE 2.32 (H) 06/08/2015   BUN 34 (H) 06/08/2015   CO2 28 06/08/2015   TSH 1.80 06/08/2015   PSA 0.03 (L) 06/29/2009   INR 1.1 04/08/2008   HGBA1C 8.9 08/29/2015   MICROALBUR 5.2 (H) 06/08/2015    Dg Lumbar Spine Complete  Result Date: 05/11/2014 CLINICAL DATA:  Five 6 years of low back pain, patient has sustained a fall yesterday ; history of diabetes, prostate malignancy. EXAM: LUMBAR SPINE - COMPLETE 4+ VIEW COMPARISON:  Coronal and sagittal reconstructed images from an abdominal and pelvic CT scan dated April 30, 2013 FINDINGS: The lumbar vertebral bodies are preserved in height. The L4-5 disc space exhibits mild narrowing. There is facet  joint hypertrophy at L4-5 and at L5-S1. There is no spondylolisthesis. The pedicles and transverse processes are intact. The observed portions of the sacrum exhibit no acute abnormalities. IMPRESSION: There is no acute compression fracture. There is no dislocation. There is mild degenerative disc space narrowing at L4-5 with facet joint hypertrophy at L4-5 and L5-S1. Electronically Signed   By: David  Martinique M.D.   On: 05/11/2014 16:12    Assessment & Plan:   Tyvion was seen today for cellulitis.  Diagnoses and all orders for this visit:  Cellulitis of leg, left- he has had a superficial wound and now has signs and symptoms of cellulitis, will treat for strep and staph with doxycycline. -  doxycycline (MONODOX) 100 MG capsule; Take 1 capsule (100 mg total) by mouth 2 (two) times daily.   I am having Mr. Sproles start on doxycycline. I am also having him maintain his levETIRAcetam, atorvastatin, nitroGLYCERIN, VASCULERA, B-D INS SYR ULTRAFINE 1CC/30G, isosorbide mononitrate, aspirin EC, ONETOUCH DELICA LANCETS 30X, losartan, NOVOLIN 70/30, allopurinol, metoprolol, omeprazole, and INSULIN NPH ISOPHANE & REGULAR [place].  Meds ordered this encounter  Medications  . doxycycline (MONODOX) 100 MG capsule    Sig: Take 1 capsule (100 mg total) by mouth 2 (two) times daily.    Dispense:  20 capsule    Refill:  0     Follow-up: Return in about 1 week (around 10/16/2015).  [name], MD

## 2015-10-09 NOTE — Patient Instructions (Signed)

## 2015-10-12 DIAGNOSIS — L602 Onychogryphosis: Secondary | ICD-10-CM | POA: Diagnosis not present

## 2015-10-12 DIAGNOSIS — L84 Corns and callosities: Secondary | ICD-10-CM | POA: Diagnosis not present

## 2015-10-12 DIAGNOSIS — E1351 Other specified diabetes mellitus with diabetic peripheral angiopathy without gangrene: Secondary | ICD-10-CM | POA: Diagnosis not present

## 2015-10-19 ENCOUNTER — Ambulatory Visit (INDEPENDENT_AMBULATORY_CARE_PROVIDER_SITE_OTHER): Payer: Medicare Other | Admitting: Internal Medicine

## 2015-10-19 ENCOUNTER — Ambulatory Visit: Payer: Medicare Other

## 2015-10-19 VITALS — BP 160/70 | HR 63 | Temp 97.8°F | Ht 71.5 in | Wt 202.0 lb

## 2015-10-19 DIAGNOSIS — L03116 Cellulitis of left lower limb: Secondary | ICD-10-CM | POA: Diagnosis not present

## 2015-10-19 DIAGNOSIS — I70213 Atherosclerosis of native arteries of extremities with intermittent claudication, bilateral legs: Secondary | ICD-10-CM | POA: Diagnosis not present

## 2015-10-19 DIAGNOSIS — Z23 Encounter for immunization: Secondary | ICD-10-CM

## 2015-10-19 DIAGNOSIS — E538 Deficiency of other specified B group vitamins: Secondary | ICD-10-CM

## 2015-10-19 DIAGNOSIS — M7989 Other specified soft tissue disorders: Secondary | ICD-10-CM | POA: Diagnosis not present

## 2015-10-19 DIAGNOSIS — L988 Other specified disorders of the skin and subcutaneous tissue: Secondary | ICD-10-CM

## 2015-10-19 MED ORDER — CYANOCOBALAMIN 1000 MCG/ML IJ SOLN
1000.0000 ug | Freq: Once | INTRAMUSCULAR | Status: AC
  Administered 2015-10-19: 1000 ug via INTRAMUSCULAR

## 2015-10-19 NOTE — Patient Instructions (Signed)
Wound Care °Taking care of your wound properly can help to prevent pain and infection. It can also help your wound to heal more quickly.  °HOW TO CARE FOR YOUR WOUND  °· Take or apply over-the-counter and prescription medicines only as told by your health care provider. °· If you were prescribed antibiotic medicine, take or apply it as told by your health care provider. Do not stop using the antibiotic even if your condition improves. °· Clean the wound each day or as told by your health care provider. °¨ Wash the wound with mild soap and water. °¨ Rinse the wound with water to remove all soap. °¨ Pat the wound dry with a clean towel. Do not rub it. °· There are many different ways to close and cover a wound. For example, a wound can be covered with stitches (sutures), skin glue, or adhesive strips. Follow instructions from your health care provider about: °¨ How to take care of your wound. °¨ When and how you should change your bandage (dressing). °¨ When you should remove your dressing. °¨ Removing whatever was used to close your wound. °· Check your wound every day for signs of infection. Watch for: °¨ Redness, swelling, or pain. °¨ Fluid, blood, or pus. °· Keep the dressing dry until your health care provider says it can be removed. Do not take baths, swim, use a hot tub, or do anything that would put your wound underwater until your health care provider approves. °· Raise (elevate) the injured area above the level of your heart while you are sitting or lying down. °· Do not scratch or pick at the wound. °· Keep all follow-up visits as told by your health care provider. This is important. °SEEK MEDICAL CARE IF: °· You received a tetanus shot and you have swelling, severe pain, redness, or bleeding at the injection site. °· You have a fever. °· Your pain is not controlled with medicine. °· You have increased redness, swelling, or pain at the site of your wound. °· You have fluid, blood, or pus coming from your  wound. °· You notice a bad smell coming from your wound or your dressing. °SEEK IMMEDIATE MEDICAL CARE IF: °· You have a red streak going away from your wound. °  °This information is not intended to replace advice given to you by your health care provider. Make sure you discuss any questions you have with your health care provider. °  °Document Released: 10/03/2007 Document Revised: 05/10/2014 Document Reviewed: 12/20/2013 °Elsevier Interactive Patient Education ©2016 [employer] ° °

## 2015-10-19 NOTE — Progress Notes (Signed)
Subjective:  Patient ID: Robert Clayton, male    DOB: 11/26/1930  Age: 82 y.o. MRN: [id number]  CC: Rash   HPI Robert Clayton presents for concerns about an area on the lateral aspect of his left lower extremity over the mid shin. It has been there for several weeks and he describes it as a red, painful, sometimes itchy area about the size of a golf ball. I saw him a week ago and thought this might be cellulitis or an early abscess so I prescribed doxycycline. He tells me that has not helped and his symptoms persist.  Outpatient Medications Prior to Visit  Medication Sig Dispense Refill  . allopurinol (ZYLOPRIM) 300 MG tablet Take 1 tablet (300 mg total) by mouth daily. 90 tablet 1  . aspirin EC 81 MG tablet Take 1 tablet (81 mg total) by mouth daily. 90 tablet 3  . atorvastatin (LIPITOR) 10 MG tablet Take 1 tablet (10 mg total) by mouth daily. 90 tablet 3  . B-D INS SYR ULTRAFINE 1CC/30G 30G X 1/2" 1 ML MISC 2 (two) times daily.  10  . Dietary Management Product (VASCULERA) TABS Take 1 capsule by mouth daily. 30 tablet 11  . doxycycline (MONODOX) 100 MG capsule Take 1 capsule (100 mg total) by mouth 2 (two) times daily. 20 capsule 0  . INSULIN NPH ISOPHANE & REGULAR [place] Inject 30-65 Units into the skin 2 (two) times daily. Inject 65 units under the skin with breakfast and inject 30 units under the skin with supper    . isosorbide mononitrate (IMDUR) 30 MG 24 hr tablet Take 1 tablet (30 mg total) by mouth daily. 30 tablet 5  . levETIRAcetam (KEPPRA) 500 MG tablet Take 500 mg by mouth 2 (two) times daily.    [name] losartan (COZAAR) 100 MG tablet Take 1 tablet (100 mg total) by mouth daily. 90 tablet 3  . metoprolol (LOPRESSOR) 50 MG tablet Take 1 tablet (50 mg total) by mouth 2 (two) times daily. 180 tablet 1  . nitroGLYCERIN (NITROSTAT) 0.4 MG SL tablet Place 1 tablet (0.4 mg total) under the tongue every 5 (five) minutes as needed. x3 doses as needed for chest pain 30 tablet 0  . NOVOLIN 70/30  (70-30) 100 UNIT/ML injection Inject 30-45 Units into the skin 2 (two) times daily with a meal.     . omeprazole (PRILOSEC) 40 MG capsule Take 40 mg by mouth daily.    Robert Clayton DELICA LANCETS 22W MISC Test 2 times daily. Dx: E11.51 100 each 5   No facility-administered medications prior to visit.     ROS Review of Systems  Constitutional: Negative.  Negative for chills, fatigue and fever.  HENT: Negative.   Eyes: Negative.   Respiratory: Negative.  Negative for cough, choking, shortness of breath and stridor.   Cardiovascular: Negative for chest pain, palpitations and leg swelling.  Gastrointestinal: Negative for abdominal pain, constipation, diarrhea and nausea.  Endocrine: Negative.   Genitourinary: Negative.   Musculoskeletal: Negative.  Negative for arthralgias and myalgias.  Skin: Positive for rash. Negative for color change, pallor and wound.  Allergic/Immunologic: Negative.   Neurological: Negative.  Negative for dizziness.  Hematological: Negative for adenopathy.  Psychiatric/Behavioral: Negative.     Objective:  BP (!) 160/70 (BP Location: Left Arm, Patient Position: Sitting, Cuff Size: Normal)   Pulse 63   Temp 97.8 F (36.6 C) (Oral)   Ht 5' 11.5" (1.816 m)   Wt 202 lb (91.6 kg)   SpO2 98%  BMI 27.78 kg/m   BP Readings from Last 3 Encounters:  10/19/15 (!) 160/70  10/09/15 (!) 160/90  09/18/15 120/80    Wt Readings from Last 3 Encounters:  10/19/15 202 lb (91.6 kg)  10/09/15 202 lb 4 oz (91.7 kg)  09/18/15 210 lb (95.3 kg)    Physical Exam  Constitutional: He is oriented to person, place, and time.  HENT:  Mouth/Throat: Oropharynx is clear and moist. No oropharyngeal exudate.  Eyes: Conjunctivae are normal. Right eye exhibits no discharge. Left eye exhibits no discharge. No scleral icterus.  Neck: Normal range of motion. Neck supple. No JVD present. No tracheal deviation present. No thyromegaly present.  Cardiovascular: Normal rate, regular rhythm,  normal heart sounds and intact distal pulses.  Exam reveals no gallop and no friction rub.   No murmur heard. Pulmonary/Chest: Effort normal and breath sounds normal. No stridor. No respiratory distress. He has no wheezes. He has no rales. He exhibits no tenderness.  Abdominal: Soft. Bowel sounds are normal. He exhibits no distension and no mass. There is no tenderness. There is no rebound.  Musculoskeletal: Normal range of motion. He exhibits no edema, tenderness or deformity.       Legs: The area was cleaned with Betadine and prepped and draped in sterile fashion. Local anesthesia was obtained with instillation of 2 mL of 2% lidocaine with epi. Next a 4 mm punch incision was made. No exudate was found within the lesion. The punch biopsy was obtained and sent for pathology. One simple interrupted suture using 4-0 nylon on a PC 3 was placed. He tolerated this well. Neosporin and a bandaid were applied.  Lymphadenopathy:    He has no cervical adenopathy.  Neurological: He is oriented to person, place, and time.  Skin: Skin is warm and dry. Rash noted. He is not diaphoretic. There is erythema. No pallor.    Lab Results  Component Value Date   WBC 9.0 06/08/2015   HGB 14.3 06/08/2015   HCT 43.3 06/08/2015   PLT 110.0 (L) 06/08/2015   GLUCOSE 117 (H) 06/08/2015   CHOL 186 06/08/2015   TRIG 175.0 (H) 06/08/2015   HDL 31.10 (L) 06/08/2015   LDLDIRECT 80.6 06/26/2010   LDLCALC 119 (H) 06/08/2015   ALT 17 09/21/2013   AST 18 09/21/2013   NA 144 06/08/2015   K 4.5 06/08/2015   CL 109 06/08/2015   CREATININE 2.32 (H) 06/08/2015   BUN 34 (H) 06/08/2015   CO2 28 06/08/2015   TSH 1.80 06/08/2015   PSA 0.03 (L) 06/29/2009   INR 1.1 04/08/2008   HGBA1C 8.9 08/29/2015   MICROALBUR 5.2 (H) 06/08/2015    Dg Lumbar Spine Complete  Result Date: 05/11/2014 CLINICAL DATA:  Five 6 years of low back pain, patient has sustained a fall yesterday ; history of diabetes, prostate malignancy. EXAM: LUMBAR  SPINE - COMPLETE 4+ VIEW COMPARISON:  Coronal and sagittal reconstructed images from an abdominal and pelvic CT scan dated April 30, 2013 FINDINGS: The lumbar vertebral bodies are preserved in height. The L4-5 disc space exhibits mild narrowing. There is facet joint hypertrophy at L4-5 and at L5-S1. There is no spondylolisthesis. The pedicles and transverse processes are intact. The observed portions of the sacrum exhibit no acute abnormalities. IMPRESSION: There is no acute compression fracture. There is no dislocation. There is mild degenerative disc space narrowing at L4-5 with facet joint hypertrophy at L4-5 and L5-S1. Electronically Signed   By: David  Martinique M.D.   On: 05/11/2014  16:12    Assessment & Plan:   Dontavious was seen today for rash.  Diagnoses and all orders for this visit:  Need for prophylactic vaccination and inoculation against influenza -     Flu vaccine HIGH DOSE PF (Fluzone High dose)  B12 deficiency -     cyanocobalamin ((VITAMIN B-12)) injection 1,000 mcg; Inject 1 mL (1,000 mcg total) into the muscle once.  Skin macule- I will await the pathology results,  will screen for localized malignancy (such as Sweet syndrome with the biopsy), will also see if there are signs of an eosinophil infiltration, as well as vasculitis. -     Dermatology pathology   I am having Mr. Kintz maintain his levETIRAcetam, atorvastatin, nitroGLYCERIN, VASCULERA, B-D INS SYR ULTRAFINE 1CC/30G, isosorbide mononitrate, aspirin EC, ONETOUCH DELICA LANCETS 76F, losartan, NOVOLIN 70/30, allopurinol, metoprolol, omeprazole, and INSULIN NPH ISOPHANE & REGULAR [place]. We administered cyanocobalamin.  Meds ordered this encounter  Medications  . cyanocobalamin ((VITAMIN B-12)) injection 1,000 mcg     Follow-up: Return in about 1 week (around 10/26/2015).  [name], MD

## 2015-10-19 NOTE — Progress Notes (Signed)
Pre visit review using our clinic review tool, if applicable. No additional management support is needed unless otherwise documented below in the visit note. 

## 2015-10-20 ENCOUNTER — Encounter: Payer: Self-pay | Admitting: Internal Medicine

## 2015-10-26 ENCOUNTER — Encounter: Payer: Self-pay | Admitting: Internal Medicine

## 2015-10-26 ENCOUNTER — Ambulatory Visit (INDEPENDENT_AMBULATORY_CARE_PROVIDER_SITE_OTHER): Payer: Medicare Other | Admitting: Internal Medicine

## 2015-10-26 VITALS — BP 142/74 | HR 59 | Temp 97.9°F | Resp 16 | Ht 71.5 in | Wt 208.5 lb

## 2015-10-26 DIAGNOSIS — I70213 Atherosclerosis of native arteries of extremities with intermittent claudication, bilateral legs: Secondary | ICD-10-CM

## 2015-10-26 DIAGNOSIS — K5909 Other constipation: Secondary | ICD-10-CM | POA: Diagnosis not present

## 2015-10-26 DIAGNOSIS — I872 Venous insufficiency (chronic) (peripheral): Secondary | ICD-10-CM

## 2015-10-26 DIAGNOSIS — I129 Hypertensive chronic kidney disease with stage 1 through stage 4 chronic kidney disease, or unspecified chronic kidney disease: Secondary | ICD-10-CM

## 2015-10-26 MED ORDER — LINACLOTIDE 72 MCG PO CAPS: 72.0000 ug | ORAL_CAPSULE | Freq: Every day | ORAL | 1 refills | Status: DC

## 2015-10-26 MED ORDER — VASCULERA PO TABS: 1.0000 | ORAL_TABLET | Freq: Every day | ORAL | 11 refills | Status: DC

## 2015-10-26 NOTE — Patient Instructions (Signed)

## 2015-10-26 NOTE — Progress Notes (Signed)
Pre visit review using our clinic review tool, if applicable. No additional management support is needed unless otherwise documented below in the visit note. 

## 2015-10-26 NOTE — Progress Notes (Signed)
Subjective:  Patient ID: Robert Clayton, male    DOB: 01-15-1930  Age: 82 y.o. MRN: [id number]  CC: Rash and Constipation   HPI Robert Clayton presents for follow-up on rash over the left lower extremity. He had a skin biopsy performed about a week ago. The results are consistent with angiodermatitis in the setting of venous stasis. He now informs me that he has not been taking the vascularity over the last year. The site of the biopsy has healed nicely with no pain, redness, swelling, exudate.  He also complains of chronic constipation but no abdominal pain. He has tried several over-the-counter remedies without much improvement from the symptoms. He describes infrequent bowel movements that are balled up and lumpy.  Outpatient Medications Prior to Visit  Medication Sig Dispense Refill  . allopurinol (ZYLOPRIM) 300 MG tablet Take 1 tablet (300 mg total) by mouth daily. 90 tablet 1  . aspirin EC 81 MG tablet Take 1 tablet (81 mg total) by mouth daily. 90 tablet 3  . atorvastatin (LIPITOR) 10 MG tablet Take 1 tablet (10 mg total) by mouth daily. 90 tablet 3  . B-D INS SYR ULTRAFINE 1CC/30G 30G X 1/2" 1 ML MISC 2 (two) times daily.  10  . INSULIN NPH ISOPHANE & REGULAR [place] Inject 30-65 Units into the skin 2 (two) times daily. Inject 65 units under the skin with breakfast and inject 30 units under the skin with supper    . isosorbide mononitrate (IMDUR) 30 MG 24 hr tablet Take 1 tablet (30 mg total) by mouth daily. 30 tablet 5  . levETIRAcetam (KEPPRA) 500 MG tablet Take 500 mg by mouth 2 (two) times daily.    [name] losartan (COZAAR) 100 MG tablet Take 1 tablet (100 mg total) by mouth daily. 90 tablet 3  . metoprolol (LOPRESSOR) 50 MG tablet Take 1 tablet (50 mg total) by mouth 2 (two) times daily. 180 tablet 1  . nitroGLYCERIN (NITROSTAT) 0.4 MG SL tablet Place 1 tablet (0.4 mg total) under the tongue every 5 (five) minutes as needed. x3 doses as needed for chest pain 30 tablet 0  . NOVOLIN 70/30  (70-30) 100 UNIT/ML injection Inject 30-45 Units into the skin 2 (two) times daily with a meal.     . omeprazole (PRILOSEC) 40 MG capsule Take 40 mg by mouth daily.    [name] DELICA LANCETS 02H MISC Test 2 times daily. Dx: E11.51 100 each 5  . Dietary Management Product (VASCULERA) TABS Take 1 capsule by mouth daily. 30 tablet 11   No facility-administered medications prior to visit.     ROS Review of Systems  Constitutional: Negative.   HENT: Negative.   Eyes: Negative.   Respiratory: Negative for cough, choking and shortness of breath.   Cardiovascular: Negative.  Negative for chest pain, palpitations and leg swelling.  Gastrointestinal: Positive for constipation. Negative for blood in stool, diarrhea, nausea and vomiting.  Endocrine: Negative.   Genitourinary: Negative.   Musculoskeletal: Negative.  Negative for arthralgias, back pain, joint swelling, myalgias and neck pain.  Skin: Positive for rash and wound. Negative for color change and pallor.  Allergic/Immunologic: Negative.   Neurological: Negative.   Hematological: Negative for adenopathy. Does not bruise/bleed easily.  Psychiatric/Behavioral: Negative.     Objective:  BP (!) 142/74 (BP Location: Left Arm, Patient Position: Sitting, Cuff Size: Normal)   Pulse (!) 59   Temp 97.9 F (36.6 C) (Oral)   Resp 16   Ht 5' 11.5" (1.816 m)  Wt 208 lb 8 oz (94.6 kg)   SpO2 98%   BMI 28.67 kg/m   BP Readings from Last 3 Encounters:  10/26/15 (!) 142/74  10/19/15 (!) 160/70  10/09/15 (!) 160/90    Wt Readings from Last 3 Encounters:  10/26/15 208 lb 8 oz (94.6 kg)  10/19/15 202 lb (91.6 kg)  10/09/15 202 lb 4 oz (91.7 kg)    Physical Exam  Constitutional: He is oriented to person, place, and time. No distress.  HENT:  Mouth/Throat: Oropharynx is clear and moist. No oropharyngeal exudate.  Eyes: Conjunctivae are normal. Right eye exhibits no discharge. Left eye exhibits no discharge. No scleral icterus.  Neck:  Normal range of motion. Neck supple. No JVD present. No tracheal deviation present. No thyromegaly present.  Cardiovascular: Normal rate, regular rhythm, S1 normal, S2 normal and intact distal pulses.  Exam reveals no gallop and no friction rub.   Murmur heard.  Systolic murmur is present with a grade of 2/6   No diastolic murmur is present  Pulmonary/Chest: Effort normal and breath sounds normal. No respiratory distress. He has no wheezes. He has no rales. He exhibits no tenderness.  Abdominal: Soft. Bowel sounds are normal. He exhibits no distension and no mass. There is no tenderness. There is no rebound and no guarding.  Musculoskeletal: Normal range of motion. He exhibits no edema, tenderness or deformity.       Legs: Lymphadenopathy:    He has no cervical adenopathy.  Neurological: He is oriented to person, place, and time.  Skin: Skin is warm and dry. Rash noted. He is not diaphoretic. No erythema. No pallor.  Vitals reviewed.   Lab Results  Component Value Date   WBC 9.0 06/08/2015   HGB 14.3 06/08/2015   HCT 43.3 06/08/2015   PLT 110.0 (L) 06/08/2015   GLUCOSE 117 (H) 06/08/2015   CHOL 186 06/08/2015   TRIG 175.0 (H) 06/08/2015   HDL 31.10 (L) 06/08/2015   LDLDIRECT 80.6 06/26/2010   LDLCALC 119 (H) 06/08/2015   ALT 17 09/21/2013   AST 18 09/21/2013   NA 144 06/08/2015   K 4.5 06/08/2015   CL 109 06/08/2015   CREATININE 2.32 (H) 06/08/2015   BUN 34 (H) 06/08/2015   CO2 28 06/08/2015   TSH 1.80 06/08/2015   PSA 0.03 (L) 06/29/2009   INR 1.1 04/08/2008   HGBA1C 8.9 08/29/2015   MICROALBUR 5.2 (H) 06/08/2015    Dg Lumbar Spine Complete  Result Date: 05/11/2014 CLINICAL DATA:  Five 6 years of low back pain, patient has sustained a fall yesterday ; history of diabetes, prostate malignancy. EXAM: LUMBAR SPINE - COMPLETE 4+ VIEW COMPARISON:  Coronal and sagittal reconstructed images from an abdominal and pelvic CT scan dated April 30, 2013 FINDINGS: The lumbar vertebral  bodies are preserved in height. The L4-5 disc space exhibits mild narrowing. There is facet joint hypertrophy at L4-5 and at L5-S1. There is no spondylolisthesis. The pedicles and transverse processes are intact. The observed portions of the sacrum exhibit no acute abnormalities. IMPRESSION: There is no acute compression fracture. There is no dislocation. There is mild degenerative disc space narrowing at L4-5 with facet joint hypertrophy at L4-5 and L5-S1. Electronically Signed   By: David  Martinique M.D.   On: 05/11/2014 16:12    Assessment & Plan:   Ran was seen today for rash and constipation.  Diagnoses and all orders for this visit:  Venous stasis dermatitis of both lower extremities- I've asked him to  restart vasculera to treat this. -     Dietary Management Product (VASCULERA) TABS; Take 1 capsule by mouth daily.  Other constipation- this is a chronic condition for him and I see no recent abnormal labs to explain secondary or metabolic causes, will treat with Linzess. -     linaclotide (LINZESS) 72 MCG capsule; Take 1 capsule (72 mcg total) by mouth daily before breakfast.  Hypertensive renal disease- his blood pressure is adequately well controlled.   I am having Mr. Moen start on linaclotide. I am also having him maintain his levETIRAcetam, atorvastatin, nitroGLYCERIN, B-D INS SYR ULTRAFINE 1CC/30G, isosorbide mononitrate, aspirin EC, ONETOUCH DELICA LANCETS 95Z, losartan, NOVOLIN 70/30, allopurinol, metoprolol, omeprazole, INSULIN NPH ISOPHANE & REGULAR , and VASCULERA.  Meds ordered this encounter  Medications  . Dietary Management Product (VASCULERA) TABS    Sig: Take 1 capsule by mouth daily.    Dispense:  30 tablet    Refill:  11  . linaclotide (LINZESS) 72 MCG capsule    Sig: Take 1 capsule (72 mcg total) by mouth daily before breakfast.    Dispense:  90 capsule    Refill:  1     Follow-up: Return in about 3 months (around 01/26/2016).  [name], MD

## 2015-11-09 ENCOUNTER — Other Ambulatory Visit: Payer: Self-pay | Admitting: Endocrinology

## 2015-11-20 ENCOUNTER — Ambulatory Visit (INDEPENDENT_AMBULATORY_CARE_PROVIDER_SITE_OTHER): Payer: Medicare Other | Admitting: Internal Medicine

## 2015-11-20 ENCOUNTER — Encounter: Payer: Self-pay | Admitting: Internal Medicine

## 2015-11-20 ENCOUNTER — Ambulatory Visit: Payer: Medicare Other

## 2015-11-20 VITALS — BP 130/62 | HR 67 | Temp 97.9°F | Resp 16 | Ht 71.5 in | Wt 210.8 lb

## 2015-11-20 DIAGNOSIS — I70213 Atherosclerosis of native arteries of extremities with intermittent claudication, bilateral legs: Secondary | ICD-10-CM

## 2015-11-20 DIAGNOSIS — E538 Deficiency of other specified B group vitamins: Secondary | ICD-10-CM | POA: Diagnosis not present

## 2015-11-20 DIAGNOSIS — I2510 Atherosclerotic heart disease of native coronary artery without angina pectoris: Secondary | ICD-10-CM | POA: Diagnosis not present

## 2015-11-20 DIAGNOSIS — E1151 Type 2 diabetes mellitus with diabetic peripheral angiopathy without gangrene: Secondary | ICD-10-CM | POA: Diagnosis not present

## 2015-11-20 DIAGNOSIS — F0390 Unspecified dementia without behavioral disturbance: Secondary | ICD-10-CM

## 2015-11-20 DIAGNOSIS — E1165 Type 2 diabetes mellitus with hyperglycemia: Secondary | ICD-10-CM

## 2015-11-20 DIAGNOSIS — I129 Hypertensive chronic kidney disease with stage 1 through stage 4 chronic kidney disease, or unspecified chronic kidney disease: Secondary | ICD-10-CM

## 2015-11-20 DIAGNOSIS — IMO0002 Reserved for concepts with insufficient information to code with codable children: Secondary | ICD-10-CM

## 2015-11-20 MED ORDER — CYANOCOBALAMIN 1000 MCG/ML IJ SOLN
1000.0000 ug | Freq: Once | INTRAMUSCULAR | Status: AC
  Administered 2015-11-20: 1000 ug via INTRAMUSCULAR

## 2015-11-20 NOTE — Progress Notes (Signed)
Subjective:  Patient ID: Robert Clayton, male    DOB: Sep 28, 1930  Age: 82 y.o. MRN: [id number]  CC: Hypertension   HPI Jakeim Sedore presents for f/up - He feels well and offers no complaints but he has a question about Keppra. He thinks he stopped taking it about 6 or 8 months ago. He had a previous head injury and there was some concern that he might have had seizures. He has done well and has had no recurrent seizures since he stopped taking Keppra. He tells me his blood pressure and blood sugar are well controlled. He has no new or different symptoms today.  Outpatient Medications Prior to Visit  Medication Sig Dispense Refill  . allopurinol (ZYLOPRIM) 300 MG tablet Take 1 tablet (300 mg total) by mouth daily. 90 tablet 1  . aspirin EC 81 MG tablet Take 1 tablet (81 mg total) by mouth daily. 90 tablet 3  . atorvastatin (LIPITOR) 10 MG tablet Take 1 tablet (10 mg total) by mouth daily. 90 tablet 3  . B-D INS SYR ULTRAFINE 1CC/30G 30G X 1/2" 1 ML MISC USE TWICE DAILY 100 each 11  . Dietary Management Product (VASCULERA) TABS Take 1 capsule by mouth daily. 30 tablet 11  . INSULIN NPH ISOPHANE & REGULAR [place] Inject 30-65 Units into the skin 2 (two) times daily. Inject 65 units under the skin with breakfast and inject 30 units under the skin with supper    . isosorbide mononitrate (IMDUR) 30 MG 24 hr tablet Take 1 tablet (30 mg total) by mouth daily. 30 tablet 5  . linaclotide (LINZESS) 72 MCG capsule Take 1 capsule (72 mcg total) by mouth daily before breakfast. 90 capsule 1  . losartan (COZAAR) 100 MG tablet Take 1 tablet (100 mg total) by mouth daily. 90 tablet 3  . metoprolol (LOPRESSOR) 50 MG tablet Take 1 tablet (50 mg total) by mouth 2 (two) times daily. 180 tablet 1  . nitroGLYCERIN (NITROSTAT) 0.4 MG SL tablet Place 1 tablet (0.4 mg total) under the tongue every 5 (five) minutes as needed. x3 doses as needed for chest pain 30 tablet 0  . NOVOLIN 70/30 (70-30) 100 UNIT/ML injection Inject  30-45 Units into the skin 2 (two) times daily with a meal.     . omeprazole (PRILOSEC) 40 MG capsule Take 40 mg by mouth daily.    [name] DELICA LANCETS 03J MISC Test 2 times daily. Dx: E11.51 100 each 5  . levETIRAcetam (KEPPRA) 500 MG tablet Take 500 mg by mouth 2 (two) times daily.     No facility-administered medications prior to visit.     ROS Review of Systems  Constitutional: Negative.  Negative for activity change, appetite change, diaphoresis, fatigue and unexpected weight change.  HENT: Negative.  Negative for sinus pressure and trouble swallowing.   Eyes: Negative for visual disturbance.  Respiratory: Negative for cough, choking, chest tightness, shortness of breath and stridor.   Cardiovascular: Negative for chest pain, palpitations and leg swelling.  Gastrointestinal: Negative.  Negative for abdominal pain, constipation, diarrhea, nausea and vomiting.  Endocrine: Negative.  Negative for polydipsia, polyphagia and polyuria.  Genitourinary: Negative.   Musculoskeletal: Negative for arthralgias, back pain, myalgias and neck pain.  Skin: Negative.   Allergic/Immunologic: Negative.   Neurological: Negative.  Negative for dizziness, seizures, syncope, weakness and headaches.  Hematological: Negative.  Negative for adenopathy. Does not bruise/bleed easily.  Psychiatric/Behavioral: Negative.  Negative for decreased concentration and suicidal ideas. The patient is not nervous/anxious.  Objective:  BP 130/62 (BP Location: Left Arm, Patient Position: Sitting, Cuff Size: Normal)   Pulse 67   Temp 97.9 F (36.6 C) (Oral)   Ht 5' 11.5" (1.816 m)   Wt 210 lb 12 oz (95.6 kg)   SpO2 98%   BMI 28.98 kg/m   BP Readings from Last 3 Encounters:  11/20/15 130/62  10/26/15 (!) 142/74  10/19/15 (!) 160/70    Wt Readings from Last 3 Encounters:  11/20/15 210 lb 12 oz (95.6 kg)  10/26/15 208 lb 8 oz (94.6 kg)  10/19/15 202 lb (91.6 kg)    Physical Exam  Constitutional: He  is oriented to person, place, and time. No distress.  HENT:  Mouth/Throat: Oropharynx is clear and moist. No oropharyngeal exudate.  Eyes: Conjunctivae are normal. Right eye exhibits no discharge. Left eye exhibits no discharge. No scleral icterus.  Neck: Normal range of motion. Neck supple. No JVD present. No tracheal deviation present. No thyromegaly present.  Cardiovascular: Normal rate, regular rhythm and intact distal pulses.  Exam reveals no gallop and no friction rub.   Murmur heard.  Systolic murmur is present with a grade of 2/6   No diastolic murmur is present  Pulmonary/Chest: Effort normal and breath sounds normal. No stridor. No respiratory distress. He has no wheezes. He has no rales. He exhibits no tenderness.  Abdominal: Soft. Bowel sounds are normal. He exhibits no distension and no mass. There is no tenderness. There is no rebound and no guarding.  Musculoskeletal: Normal range of motion. He exhibits no edema, tenderness or deformity.  Lymphadenopathy:    He has no cervical adenopathy.  Neurological: He is oriented to person, place, and time.  Skin: Skin is warm and dry. No rash noted. He is not diaphoretic. No erythema. No pallor.  Vitals reviewed.   Lab Results  Component Value Date   WBC 9.0 06/08/2015   HGB 14.3 06/08/2015   HCT 43.3 06/08/2015   PLT 110.0 (L) 06/08/2015   GLUCOSE 117 (H) 06/08/2015   CHOL 186 06/08/2015   TRIG 175.0 (H) 06/08/2015   HDL 31.10 (L) 06/08/2015   LDLDIRECT 80.6 06/26/2010   LDLCALC 119 (H) 06/08/2015   ALT 17 09/21/2013   AST 18 09/21/2013   NA 144 06/08/2015   K 4.5 06/08/2015   CL 109 06/08/2015   CREATININE 2.32 (H) 06/08/2015   BUN 34 (H) 06/08/2015   CO2 28 06/08/2015   TSH 1.80 06/08/2015   PSA 0.03 (L) 06/29/2009   INR 1.1 04/08/2008   HGBA1C 8.9 08/29/2015   MICROALBUR 5.2 (H) 06/08/2015    Dg Lumbar Spine Complete  Result Date: 05/11/2014 CLINICAL DATA:  Five 6 years of low back pain, patient has sustained a  fall yesterday ; history of diabetes, prostate malignancy. EXAM: LUMBAR SPINE - COMPLETE 4+ VIEW COMPARISON:  Coronal and sagittal reconstructed images from an abdominal and pelvic CT scan dated April 30, 2013 FINDINGS: The lumbar vertebral bodies are preserved in height. The L4-5 disc space exhibits mild narrowing. There is facet joint hypertrophy at L4-5 and at L5-S1. There is no spondylolisthesis. The pedicles and transverse processes are intact. The observed portions of the sacrum exhibit no acute abnormalities. IMPRESSION: There is no acute compression fracture. There is no dislocation. There is mild degenerative disc space narrowing at L4-5 with facet joint hypertrophy at L4-5 and L5-S1. Electronically Signed   By: David  Martinique M.D.   On: 05/11/2014 16:12    Assessment & Plan:   [name]  was seen today for hypertension.  Diagnoses and all orders for this visit:  B12 deficiency -     cyanocobalamin ((VITAMIN B-12)) injection 1,000 mcg; Inject 1 mL (1,000 mcg total) into the muscle once.  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 presenium - he has been seizure-free for many months with no antiepileptic therapy so I advised him that he can safely stay off of Keppra.  Atherosclerosis of native coronary artery of native heart without angina pectoris- he has had no recent episodes of angina, will continue risk factor modification with statin therapy, blood pressure control, and blood sugar control.  DM (diabetes mellitus), type 2, uncontrolled, periph vascular complic ([place])- his last O7H was 8.9%, his blood sugars are not well-controlled, he agrees to follow-up with endocrinology about this  Hypertensive renal disease- his blood pressure is well-controlled.   I have discontinued Mr. Balaguer levETIRAcetam. I am also having him maintain his atorvastatin, nitroGLYCERIN, isosorbide mononitrate, aspirin EC, ONETOUCH DELICA LANCETS 21F, losartan, NOVOLIN 70/30, allopurinol, metoprolol, omeprazole, INSULIN  NPH ISOPHANE & REGULAR [place], VASCULERA, linaclotide, and B-D INS SYR ULTRAFINE 1CC/30G. We administered cyanocobalamin.  Meds ordered this encounter  Medications  . cyanocobalamin ((VITAMIN B-12)) injection 1,000 mcg     Follow-up: Return in about 6 months (around 05/19/2016).  [name], MD

## 2015-11-20 NOTE — Progress Notes (Signed)
Pre visit review using our clinic review tool, if applicable. No additional management support is needed unless otherwise documented below in the visit note. 

## 2015-11-20 NOTE — Patient Instructions (Signed)
Hypertension Hypertension, commonly called high blood pressure, is when the force of blood pumping through your arteries is too strong. Your arteries are the blood vessels that carry blood from your heart throughout your body. A blood pressure reading consists of a higher number over a lower number, such as 110/72. The higher number (systolic) is the pressure inside your arteries when your heart pumps. The lower number (diastolic) is the pressure inside your arteries when your heart relaxes. Ideally you want your blood pressure below 120/80. Hypertension forces your heart to work harder to pump blood. Your arteries may become narrow or stiff. Having untreated or uncontrolled hypertension can cause heart attack, stroke, kidney disease, and other problems. RISK FACTORS Some risk factors for high blood pressure are controllable. Others are not.  Risk factors you cannot control include:   Race. You may be at higher risk if you are African American.  Age. Risk increases with age.  Gender. Men are at higher risk than women before age 45 years. After age 65, women are at higher risk than men. Risk factors you can control include:  Not getting enough exercise or physical activity.  Being overweight.  Getting too much fat, sugar, calories, or salt in your diet.  Drinking too much alcohol. SIGNS AND SYMPTOMS Hypertension does not usually cause signs or symptoms. Extremely high blood pressure (hypertensive crisis) may cause headache, anxiety, shortness of breath, and nosebleed. DIAGNOSIS To check if you have hypertension, your health care provider will measure your blood pressure while you are seated, with your arm held at the level of your heart. It should be measured at least twice using the same arm. Certain conditions can cause a difference in blood pressure between your right and left arms. A blood pressure reading that is higher than normal on one occasion does not mean that you need treatment. If  it is not clear whether you have high blood pressure, you may be asked to return on a different day to have your blood pressure checked again. Or, you may be asked to monitor your blood pressure at home for 1 or more weeks. TREATMENT Treating high blood pressure includes making lifestyle changes and possibly taking medicine. Living a healthy lifestyle can help lower high blood pressure. You may need to change some of your habits. Lifestyle changes may include:  Following the DASH diet. This diet is high in fruits, vegetables, and whole grains. It is low in salt, red meat, and added sugars.  Keep your sodium intake below 2,300 mg per day.  Getting at least 30-45 minutes of aerobic exercise at least 4 times per week.  Losing weight if necessary.  Not smoking.  Limiting alcoholic beverages.  Learning ways to reduce stress. Your health care provider may prescribe medicine if lifestyle changes are not enough to get your blood pressure under control, and if one of the following is true:  You are 18-59 years of age and your systolic blood pressure is above 140.  You are 60 years of age or older, and your systolic blood pressure is above 150.  Your diastolic blood pressure is above 90.  You have diabetes, and your systolic blood pressure is over 140 or your diastolic blood pressure is over 90.  You have kidney disease and your blood pressure is above 140/90.  You have heart disease and your blood pressure is above 140/90. Your personal target blood pressure may vary depending on your medical conditions, your age, and other factors. HOME CARE INSTRUCTIONS    Have your blood pressure rechecked as directed by your health care provider.   Take medicines only as directed by your health care provider. Follow the directions carefully. Blood pressure medicines must be taken as prescribed. The medicine does not work as well when you skip doses. Skipping doses also puts you at risk for  problems.  Do not smoke.   Monitor your blood pressure at home as directed by your health care provider. SEEK MEDICAL CARE IF:   You think you are having a reaction to medicines taken.  You have recurrent headaches or feel dizzy.  You have swelling in your ankles.  You have trouble with your vision. SEEK IMMEDIATE MEDICAL CARE IF:  You develop a severe headache or confusion.  You have unusual weakness, numbness, or feel faint.  You have severe chest or abdominal pain.  You vomit repeatedly.  You have trouble breathing. MAKE SURE YOU:   Understand these instructions.  Will watch your condition.  Will get help right away if you are not doing well or get worse.   This information is not intended to replace advice given to you by your health care provider. Make sure you discuss any questions you have with your health care provider.   Document Released: 12/24/2004 Document Revised: 05/10/2014 Document Reviewed: 10/16/2012 Elsevier Interactive Patient Education 2016 [employer]  

## 2015-11-28 ENCOUNTER — Ambulatory Visit: Payer: Medicare Other | Admitting: Endocrinology

## 2015-11-29 ENCOUNTER — Ambulatory Visit: Payer: Medicare Other | Admitting: Endocrinology

## 2015-12-05 ENCOUNTER — Other Ambulatory Visit: Payer: Self-pay | Admitting: Endocrinology

## 2015-12-07 ENCOUNTER — Ambulatory Visit (INDEPENDENT_AMBULATORY_CARE_PROVIDER_SITE_OTHER): Payer: Medicare Other | Admitting: Endocrinology

## 2015-12-07 ENCOUNTER — Encounter: Payer: Self-pay | Admitting: Endocrinology

## 2015-12-07 DIAGNOSIS — I70213 Atherosclerosis of native arteries of extremities with intermittent claudication, bilateral legs: Secondary | ICD-10-CM

## 2015-12-07 DIAGNOSIS — E1165 Type 2 diabetes mellitus with hyperglycemia: Secondary | ICD-10-CM | POA: Diagnosis not present

## 2015-12-07 DIAGNOSIS — E1151 Type 2 diabetes mellitus with diabetic peripheral angiopathy without gangrene: Secondary | ICD-10-CM | POA: Diagnosis not present

## 2015-12-07 DIAGNOSIS — IMO0002 Reserved for concepts with insufficient information to code with codable children: Secondary | ICD-10-CM

## 2015-12-07 LAB — POCT GLYCOSYLATED HEMOGLOBIN (HGB A1C): HEMOGLOBIN A1C: 8.9

## 2015-12-07 MED ORDER — NOVOLIN 70/30 (70-30) 100 UNIT/ML ~~LOC~~ SUSP: SUBCUTANEOUS | 11 refills | Status: DC

## 2015-12-07 NOTE — Patient Instructions (Addendum)
check your blood sugar twice a day.  vary the time of day when you check, between before the 3 meals, and at bedtime.  also check if you have symptoms of your blood sugar being too high or too low.  please keep a record of the readings and bring it to your next appointment here.  please call us sooner if your blood sugar goes below 70, or if you have a lot of readings over 200.    Please increase the insulin to 60 units with breakfast, and 40 units with supper.  Please take these numbers no matter what your blood sugar is.   On this type of insulin schedule, you should eat meals on a regular schedule (especially lunch).  If a meal is missed or significantly delayed, your blood sugar could go low.   Please come back for a follow-up appointment in 3 months.
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
## 2015-12-18 DIAGNOSIS — L602 Onychogryphosis: Secondary | ICD-10-CM | POA: Diagnosis not present

## 2015-12-18 DIAGNOSIS — E1151 Type 2 diabetes mellitus with diabetic peripheral angiopathy without gangrene: Secondary | ICD-10-CM | POA: Diagnosis not present

## 2015-12-20 ENCOUNTER — Ambulatory Visit (INDEPENDENT_AMBULATORY_CARE_PROVIDER_SITE_OTHER): Payer: Medicare Other

## 2015-12-20 DIAGNOSIS — D518 Other vitamin B12 deficiency anemias: Secondary | ICD-10-CM

## 2015-12-20 MED ORDER — CYANOCOBALAMIN 1000 MCG/ML IJ SOLN
1000.0000 ug | Freq: Once | INTRAMUSCULAR | Status: AC
  Administered 2015-12-20: 1000 ug via INTRAMUSCULAR

## 2015-12-20 NOTE — Progress Notes (Signed)
Patient here for B12 injection. Gave Cyancobalamin 1026mcg in IM in L Deltoid. Patient tolerated well. Band-Aid applied.

## 2016-01-10 ENCOUNTER — Other Ambulatory Visit: Payer: Self-pay | Admitting: Internal Medicine

## 2016-01-18 ENCOUNTER — Ambulatory Visit (INDEPENDENT_AMBULATORY_CARE_PROVIDER_SITE_OTHER): Payer: Medicare Other

## 2016-01-18 DIAGNOSIS — E538 Deficiency of other specified B group vitamins: Secondary | ICD-10-CM | POA: Diagnosis not present

## 2016-01-18 MED ORDER — CYANOCOBALAMIN 1000 MCG/ML IJ SOLN
1000.0000 ug | Freq: Once | INTRAMUSCULAR | Status: AC
  Administered 2016-01-18: 1000 ug via INTRAMUSCULAR

## 2016-01-28 ENCOUNTER — Other Ambulatory Visit: Payer: Self-pay | Admitting: Internal Medicine

## 2016-02-13 ENCOUNTER — Other Ambulatory Visit: Payer: Self-pay | Admitting: Internal Medicine

## 2016-02-13 DIAGNOSIS — I25110 Atherosclerotic heart disease of native coronary artery with unstable angina pectoris: Secondary | ICD-10-CM

## 2016-02-13 DIAGNOSIS — R072 Precordial pain: Secondary | ICD-10-CM

## 2016-02-20 ENCOUNTER — Ambulatory Visit (INDEPENDENT_AMBULATORY_CARE_PROVIDER_SITE_OTHER): Payer: Medicare Other

## 2016-02-20 DIAGNOSIS — E538 Deficiency of other specified B group vitamins: Secondary | ICD-10-CM

## 2016-02-20 MED ORDER — CYANOCOBALAMIN 1000 MCG/ML IJ SOLN
1000.0000 ug | Freq: Once | INTRAMUSCULAR | Status: AC
  Administered 2016-02-20: 1000 ug via INTRAMUSCULAR

## 2016-02-20 MED ORDER — CYANOCOBALAMIN 1000 MCG/ML IJ SOLN: 1000.0000 ug | Freq: Once | INTRAMUSCULAR | Status: DC

## 2016-03-06 ENCOUNTER — Encounter: Payer: Self-pay | Admitting: Endocrinology

## 2016-03-06 ENCOUNTER — Ambulatory Visit (INDEPENDENT_AMBULATORY_CARE_PROVIDER_SITE_OTHER): Payer: Medicare Other | Admitting: Endocrinology

## 2016-03-06 VITALS — BP 126/70 | HR 82 | Ht 71.5 in | Wt 215.0 lb

## 2016-03-06 DIAGNOSIS — E1151 Type 2 diabetes mellitus with diabetic peripheral angiopathy without gangrene: Secondary | ICD-10-CM | POA: Diagnosis not present

## 2016-03-06 DIAGNOSIS — E1165 Type 2 diabetes mellitus with hyperglycemia: Secondary | ICD-10-CM

## 2016-03-06 DIAGNOSIS — IMO0002 Reserved for concepts with insufficient information to code with codable children: Secondary | ICD-10-CM

## 2016-03-06 DIAGNOSIS — I25110 Atherosclerotic heart disease of native coronary artery with unstable angina pectoris: Secondary | ICD-10-CM

## 2016-03-06 LAB — POCT GLYCOSYLATED HEMOGLOBIN (HGB A1C): Hemoglobin A1C: 8

## 2016-03-06 MED ORDER — NOVOLIN 70/30 (70-30) 100 UNIT/ML ~~LOC~~ SUSP: SUBCUTANEOUS | 11 refills | Status: DC
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
## 2016-03-06 NOTE — Patient Instructions (Addendum)
check your blood sugar twice a day.  vary the time of day when you check, between before the 3 meals, and at bedtime.  also check if you have symptoms of your blood sugar being too high or too low.  please keep a record of the readings and bring it to your next appointment here.  please call us sooner if your blood sugar goes below 70, or if you have a lot of readings over 200.    Please change the insulin to 65 units with breakfast, and 35 units with supper.  Please take these numbers no matter what your blood sugar is.   On this type of insulin schedule, you should eat meals on a regular schedule (especially lunch).  If a meal is missed or significantly delayed, your blood sugar could go low.   Please come back for a follow-up appointment in 3 months.

## 2016-03-19 ENCOUNTER — Ambulatory Visit (INDEPENDENT_AMBULATORY_CARE_PROVIDER_SITE_OTHER): Payer: Medicare Other

## 2016-03-19 DIAGNOSIS — E538 Deficiency of other specified B group vitamins: Secondary | ICD-10-CM | POA: Diagnosis not present

## 2016-03-19 MED ORDER — CYANOCOBALAMIN 1000 MCG/ML IJ SOLN
1000.0000 ug | Freq: Once | INTRAMUSCULAR | Status: AC
  Administered 2016-03-19: 1000 ug via INTRAMUSCULAR

## 2016-03-25 DIAGNOSIS — L602 Onychogryphosis: Secondary | ICD-10-CM | POA: Diagnosis not present

## 2016-03-25 DIAGNOSIS — L84 Corns and callosities: Secondary | ICD-10-CM | POA: Diagnosis not present

## 2016-03-25 DIAGNOSIS — E1351 Other specified diabetes mellitus with diabetic peripheral angiopathy without gangrene: Secondary | ICD-10-CM | POA: Diagnosis not present

## 2016-03-25 DIAGNOSIS — I70293 Other atherosclerosis of native arteries of extremities, bilateral legs: Secondary | ICD-10-CM | POA: Diagnosis not present

## 2016-03-28 ENCOUNTER — Other Ambulatory Visit: Payer: Self-pay | Admitting: Internal Medicine

## 2016-04-08 ENCOUNTER — Telehealth: Payer: Self-pay | Admitting: Internal Medicine

## 2016-04-08 NOTE — Telephone Encounter (Signed)
Called patient to schedule awv. Lvm for patient to call office to schedule appt.  °

## 2016-04-09 ENCOUNTER — Other Ambulatory Visit: Payer: Self-pay | Admitting: Internal Medicine

## 2016-04-13 ENCOUNTER — Other Ambulatory Visit: Payer: Self-pay | Admitting: Internal Medicine

## 2016-04-13 DIAGNOSIS — I25110 Atherosclerotic heart disease of native coronary artery with unstable angina pectoris: Secondary | ICD-10-CM

## 2016-04-13 DIAGNOSIS — I70213 Atherosclerosis of native arteries of extremities with intermittent claudication, bilateral legs: Secondary | ICD-10-CM

## 2016-04-15 ENCOUNTER — Ambulatory Visit (INDEPENDENT_AMBULATORY_CARE_PROVIDER_SITE_OTHER): Payer: Medicare Other | Admitting: *Deleted

## 2016-04-15 VITALS — BP 152/93 | HR 76 | Resp 20 | Ht 72.0 in | Wt 212.0 lb

## 2016-04-15 DIAGNOSIS — Z0000 Encounter for general adult medical examination without abnormal findings: Secondary | ICD-10-CM | POA: Diagnosis not present

## 2016-04-15 NOTE — Progress Notes (Signed)
Pre visit review using our clinic review tool, if applicable. No additional management support is needed unless otherwise documented below in the visit note. 
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
## 2016-04-15 NOTE — Patient Instructions (Addendum)
Robert Larry MD: call to make eye exam appointment [phone] Address: [address], [place], [place] [address]  [facility]  /  [email or web address]  /  [phone] ext. [phone] Robert Clayton  /  [email or web address]  /  [phone] ext. [phone]  Start to eat heart healthy diet (full of fruits, vegetables, whole grains, lean protein, water--limit salt, fat, and sugar intake) and increase physical activity as tolerated.   [name] , Thank you for taking time to come for your Medicare Wellness Visit. I appreciate your ongoing commitment to your health goals. Please review the following plan we discussed and let me know if I can assist you in the future.   These are the goals we discussed: Goals    . improve the insomnia I have been experiencing          Implement the sleep and insomnia tips I received today.       This is a list of the screening recommended for you and due dates:  Health Maintenance  Topic Date Due  . Eye exam for diabetics  06/13/2015  . Flu Shot  08/07/2016  . Hemoglobin A1C  09/03/2016  . Complete foot exam   03/06/2017  . Tetanus Vaccine  09/03/2022  . Pneumonia vaccines  Completed   Insomnia Insomnia is a sleep disorder that makes it difficult to fall asleep or to stay asleep. Insomnia can cause tiredness (fatigue), low energy, difficulty concentrating, mood swings, and poor performance at work or school. There are three different ways to classify insomnia:  Difficulty falling asleep.  Difficulty staying asleep.  Waking up too early in the morning. Any type of insomnia can be long-term (chronic) or short-term (acute). Both are common. Short-term insomnia usually lasts for three months or less. Chronic insomnia occurs at least three times a week for longer than three months. What are the causes? Insomnia may be caused by another condition, situation, or substance, such as:  Anxiety.  Certain  medicines.  Gastroesophageal reflux disease (GERD) or other gastrointestinal conditions.  Asthma or other breathing conditions.  Restless legs syndrome, sleep apnea, or other sleep disorders.  Chronic pain.  Menopause. This may include hot flashes.  Stroke.  Abuse of alcohol, tobacco, or illegal drugs.  Depression.  Caffeine.  Neurological disorders, such as Alzheimer disease.  An overactive thyroid (hyperthyroidism). The cause of insomnia may not be known. What increases the risk? Risk factors for insomnia include:  Gender. Women are more commonly affected than men.  Age. Insomnia is more common as you get older.  Stress. This may involve your professional or personal life.  Income. Insomnia is more common in people with lower income.  Lack of exercise.  Irregular work schedule or night shifts.  Traveling between different time zones. What are the signs or symptoms? If you have insomnia, trouble falling asleep or trouble staying asleep is the main symptom. This may lead to other symptoms, such as:  Feeling fatigued.  Feeling nervous about going to sleep.  Not feeling rested in the morning.  Having trouble concentrating.  Feeling irritable, anxious, or depressed. How is this treated? Treatment for insomnia depends on the cause. If your insomnia is caused by an underlying condition, treatment will focus on addressing the condition. Treatment may also include:  Medicines to help you sleep.  Counseling or therapy.  Lifestyle adjustments. Follow these instructions at home:  Take medicines only as directed by your health care  provider.  Keep regular sleeping and waking hours. Avoid naps.  Keep a sleep diary to help you and your health care provider figure out what could be causing your insomnia. Include:  When you sleep.  When you wake up during the night.  How well you sleep.  How rested you feel the next day.  Any side effects of medicines you  are taking.  What you eat and drink.  Make your bedroom a comfortable place where it is easy to fall asleep:  Put up shades or special blackout curtains to block light from outside.  Use a white noise machine to block noise.  Keep the temperature cool.  Exercise regularly as directed by your health care provider. Avoid exercising right before bedtime.  Use relaxation techniques to manage stress. Ask your health care provider to suggest some techniques that may work well for you. These may include:  Breathing exercises.  Routines to release muscle tension.  Visualizing peaceful scenes.  Cut back on alcohol, caffeinated beverages, and cigarettes, especially close to bedtime. These can disrupt your sleep.  Do not overeat or eat spicy foods right before bedtime. This can lead to digestive discomfort that can make it hard for you to sleep.  Limit screen use before bedtime. This includes:  Watching TV.  Using your smartphone, tablet, and computer.  Stick to a routine. This can help you fall asleep faster. Try to do a quiet activity, brush your teeth, and go to bed at the same time each night.  Get out of bed if you are still awake after 15 minutes of trying to sleep. Keep the lights down, but try reading or doing a quiet activity. When you feel sleepy, go back to bed.  Make sure that you drive carefully. Avoid driving if you feel very sleepy.  Keep all follow-up appointments as directed by your health care provider. This is important. Contact a health care provider if:  You are tired throughout the day or have trouble in your daily routine due to sleepiness.  You continue to have sleep problems or your sleep problems get worse. Get help right away if:  You have serious thoughts about hurting yourself or someone else. This information is not intended to replace advice given to you by your health care provider. Make sure you discuss any questions you have with your health care  provider. Document Released: 12/22/1999 Document Revised: 05/26/2015 Document Reviewed: 09/24/2013 Elsevier Interactive Patient Education  2017 [place] for Diabetes Mellitus, Adult Carbohydrate counting is a method for keeping track of how many carbohydrates you eat. Eating carbohydrates naturally increases the amount of sugar (glucose) in the blood. Counting how many carbohydrates you eat helps keep your blood glucose within normal limits, which helps you manage your diabetes (diabetes mellitus). It is important to know how many carbohydrates you can safely have in each meal. This is different for every person. A diet and nutrition specialist (registered dietitian) can help you make a meal plan and calculate how many carbohydrates you should have at each meal and snack. Carbohydrates are found in the following foods:  Grains, such as breads and cereals.  Dried beans and soy products.  Starchy vegetables, such as potatoes, peas, and corn.  Fruit and fruit juices.  Milk and yogurt.  Sweets and snack foods, such as cake, cookies, candy, chips, and soft drinks. How do I count carbohydrates? There are two ways to count carbohydrates in food. You can use either of  the methods or a combination of both. Reading "Nutrition Facts" on packaged food  The "Nutrition Facts" list is included on the labels of almost all packaged foods and beverages in the [place] It includes:  The serving size.  Information about nutrients in each serving, including the grams (g) of carbohydrate per serving. To use the "Nutrition Facts":  Decide how many servings you will have.  Multiply the number of servings by the number of carbohydrates per serving.  The resulting number is the total amount of carbohydrates that you will be having. Learning standard serving sizes of other foods  When you eat foods containing carbohydrates that are not packaged or do not include "Nutrition Facts"  on the label, you need to measure the servings in order to count the amount of carbohydrates:  Measure the foods that you will eat with a food scale or measuring cup, if needed.  Decide how many standard-size servings you will eat.  Multiply the number of servings by 15. Most carbohydrate-rich foods have about 15 g of carbohydrates per serving.  For example, if you eat 8 oz (170 g) of strawberries, you will have eaten 2 servings and 30 g of carbohydrates (2 servings x 15 g = 30 g).  For foods that have more than one food mixed, such as soups and casseroles, you must count the carbohydrates in each food that is included. The following list contains standard serving sizes of common carbohydrate-rich foods. Each of these servings has about 15 g of carbohydrates:   hamburger bun or  English muffin.   oz (15 mL) syrup.   oz (14 g) jelly.  1 slice of bread.  1 six-inch tortilla.  3 oz (85 g) cooked rice or pasta.  4 oz (113 g) cooked dried beans.  4 oz (113 g) starchy vegetable, such as peas, corn, or potatoes.  4 oz (113 g) hot cereal.  4 oz (113 g) mashed potatoes or  of a large baked potato.  4 oz (113 g) canned or frozen fruit.  4 oz (120 mL) fruit juice.  4-6 crackers.  6 chicken nuggets.  6 oz (170 g) unsweetened dry cereal.  6 oz (170 g) plain fat-free yogurt or yogurt sweetened with artificial sweeteners.  8 oz (240 mL) milk.  8 oz (170 g) fresh fruit or one small piece of fruit.  24 oz (680 g) popped popcorn. Example of carbohydrate counting Sample meal   3 oz (85 g) chicken breast.  6 oz (170 g) brown rice.  4 oz (113 g) corn.  8 oz (240 mL) milk.  8 oz (170 g) strawberries with sugar-free whipped topping. Carbohydrate calculation  1. Identify the foods that contain carbohydrates:  Rice.  Corn.  Milk.  Strawberries. 2. Calculate how many servings you have of each food:  2 servings rice.  1 serving corn.  1 serving milk.  1  serving strawberries. 3. Multiply each number of servings by 15 g:  2 servings rice x 15 g = 30 g.  1 serving corn x 15 g = 15 g.  1 serving milk x 15 g = 15 g.  1 serving strawberries x 15 g = 15 g. 4. Add together all of the amounts to find the total grams of carbohydrates eaten:  30 g + 15 g + 15 g + 15 g = 75 g of carbohydrates total. This information is not intended to replace advice given to you by your health care provider. Make sure you  discuss any questions you have with your health care provider. Document Released: 12/24/2004 Document Revised: 07/14/2015 Document Reviewed: 06/07/2015 Elsevier Interactive Patient Education  2017 [place].    High-Fiber Diet Fiber, also called dietary fiber, is a type of carbohydrate found in fruits, vegetables, whole grains, and beans. A high-fiber diet can have many health benefits. Your health care provider may recommend a high-fiber diet to help:  Prevent constipation. Fiber can make your bowel movements more regular.  Lower your cholesterol.  Relieve hemorrhoids, uncomplicated diverticulosis, or irritable bowel syndrome.  Prevent overeating as part of a weight-loss plan.  Prevent heart disease, type 2 diabetes, and certain cancers. What is my plan? The recommended daily intake of fiber includes:  38 grams for men under age 52.  57 grams for men over age 78.  44 grams for women under age 47.  93 grams for women over age 13. You can get the recommended daily intake of dietary fiber by eating a variety of fruits, vegetables, grains, and beans. Your health care provider may also recommend a fiber supplement if it is not possible to get enough fiber through your diet. What do I need to know about a high-fiber diet?  Fiber supplements have not been widely studied for their effectiveness, so it is better to get fiber through food sources.  Always check the fiber content on thenutrition facts label of any prepackaged food.  Look for foods that contain at least 5 grams of fiber per serving.  Ask your dietitian if you have questions about specific foods that are related to your condition, especially if those foods are not listed in the following section.  Increase your daily fiber consumption gradually. Increasing your intake of dietary fiber too quickly may cause bloating, cramping, or gas.  Drink plenty of water. Water helps you to digest fiber. What foods can I eat? Grains  Whole-grain breads. Multigrain cereal. Oats and oatmeal. Brown rice. Barley. Bulgur wheat. [place]. Bran muffins. Popcorn. Rye wafer crackers. Vegetables  Sweet potatoes. Spinach. Kale. Artichokes. Cabbage. Broccoli. Green peas. Carrots. Squash. Fruits  Berries. Pears. Apples. Oranges. Avocados. Prunes and raisins. Dried figs. Meats and Other Protein Sources  [place], kidney, pinto, and soy beans. Split peas. Lentils. Nuts and seeds. Dairy  Fiber-fortified yogurt. Beverages  Fiber-fortified soy milk. Fiber-fortified orange juice. Other  Fiber bars. The items listed above may not be a complete list of recommended foods or beverages. Contact your dietitian for more options.  What foods are not recommended? Grains  White bread. Pasta made with refined flour. White rice. Vegetables  Fried potatoes. Canned vegetables. Well-cooked vegetables. Fruits  Fruit juice. Cooked, strained fruit. Meats and Other Protein Sources  Fatty cuts of meat. Fried Sales executive or fried fish. Dairy  Milk. Yogurt. Cream cheese. Sour cream. Beverages  Soft drinks. Other  Cakes and pastries. Butter and oils. The items listed above may not be a complete list of foods and beverages to avoid. Contact your dietitian for more information.  What are some tips for including high-fiber foods in my diet?  Eat a wide variety of high-fiber foods.  Make sure that half of all grains consumed each day are whole grains.  Replace breads and cereals made from refined flour or  white flour with whole-grain breads and cereals.  Replace white rice with brown rice, bulgur wheat, or millet.  Start the day with a breakfast that is high in fiber, such as a cereal that contains at least 5 grams of fiber per serving.  Use beans in place of meat in soups, salads, or pasta.  Eat high-fiber snacks, such as berries, raw vegetables, nuts, or popcorn. This information is not intended to replace advice given to you by your health care provider. Make sure you discuss any questions you have with your health care provider. Document Released: 12/24/2004 Document Revised: 06/01/2015 Document Reviewed: 06/08/2013 Elsevier Interactive Patient Education  2017 [employer] Inc[employer]  Fat and Cholesterol Restricted Diet High levels of fat and cholesterol in your blood may lead to various health problems, such as diseases of the heart, blood vessels, gallbladder, liver, and pancreas. Fats are concentrated sources of energy that come in various forms. Certain types of fat, including saturated fat, may be harmful in excess. Cholesterol is a substance needed by your body in small amounts. Your body makes all the cholesterol it needs. Excess cholesterol comes from the food you eat. When you have high levels of cholesterol and saturated fat in your blood, health problems can develop because the excess fat and cholesterol will gather along the walls of your blood vessels, causing them to narrow. Choosing the right foods will help you control your intake of fat and cholesterol. This will help keep the levels of these substances in your blood within normal limits and reduce your risk of disease. What is my plan? Your health care provider recommends that you:  Limit your fat intake to ______% or less of your total calories per day.  Limit the amount of cholesterol in your diet to less than _________mg per day.  Eat 20-30 grams of fiber each day. What types of fat should I choose?  Choose healthy fats more  often. Choose monounsaturated and polyunsaturated fats, such as olive and canola oil, flaxseeds, walnuts, almonds, and seeds.  Eat more omega-3 fats. Good choices include salmon, mackerel, sardines, tuna, flaxseed oil, and ground flaxseeds. Aim to eat fish at least two times a week.  Limit saturated fats. Saturated fats are primarily found in animal products, such as meats, butter, and cream. Plant sources of saturated fats include palm oil, palm kernel oil, and coconut oil.  Avoid foods with partially hydrogenated oils in them. These contain trans fats. Examples of foods that contain trans fats are stick margarine, some tub margarines, cookies, crackers, and other baked goods. What general guidelines do I need to follow? These guidelines for healthy eating will help you control your intake of fat and cholesterol:  Check food labels carefully to identify foods with trans fats or high amounts of saturated fat.  Fill one half of your plate with vegetables and green salads.  Fill one fourth of your plate with whole grains. Look for the word "whole" as the first word in the ingredient list.  Fill one fourth of your plate with lean protein foods.  Limit fruit to two servings a day. Choose fruit instead of juice.  Eat more foods that contain fiber, such as apples, broccoli, carrots, beans, peas, and barley.  Eat more home-cooked food and less restaurant, buffet, and fast food.  Limit or avoid alcohol.  Limit foods high in starch and sugar.  Limit fried foods.  Cook foods using methods other than frying. Baking, boiling, grilling, and broiling are all great options.  Lose weight if you are overweight. Losing just 5-10% of your initial body weight can help your overall health and prevent diseases such as diabetes and heart disease. What foods can I eat? Grains   Whole grains, such as whole wheat or whole grain  breads, crackers, cereals, and pasta. Unsweetened oatmeal, bulgur, barley,  quinoa, or brown rice. Corn or whole wheat flour tortillas. Vegetables   Fresh or frozen vegetables (raw, steamed, roasted, or grilled). Green salads. Fruits   All fresh, canned (in natural juice), or frozen fruits. Meats and other protein foods   Ground beef (85% or leaner), grass-fed beef, or beef trimmed of fat. Skinless chicken or [place]. Ground chicken or [place]. Pork trimmed of fat. All fish and seafood. Eggs. Dried beans, peas, or lentils. Unsalted nuts or seeds. Unsalted canned or dry beans. Dairy   Low-fat dairy products, such as skim or 1% milk, 2% or reduced-fat cheeses, low-fat ricotta or cottage cheese, or plain low-fat yo Fats and oils   Tub margarines without trans fats. Light or reduced-fat mayonnaise and salad dressings. Avocado. Olive, canola, sesame, or safflower oils. Natural peanut or almond butter (choose ones without added sugar and oil). The items listed above may not be a complete list of recommended foods or beverages. Contact your dietitian for more options.  Foods to avoid Grains   White bread. White pasta. White rice. Cornbread. Bagels, pastries, and croissants. Crackers that contain trans fat. Vegetables   White potatoes. Corn. Creamed or fried vegetables. Vegetables in a cheese sauce. Fruits   Dried fruits. Canned fruit in light or heavy syrup. Fruit juice. Meats and other protein foods   Fatty cuts of meat. Ribs, chicken wings, bacon, sausage, bologna, salami, chitterlings, fatback, hot dogs, bratwurst, and packaged luncheon meats. Liver and organ meats. Dairy   Whole or 2% milk, cream, half-and-half, and cream cheese. Whole milk cheeses. Whole-fat or sweetened yogurt. Full-fat cheeses. Nondairy creamers and whipped toppings. Processed cheese, cheese spreads, or cheese curds. Beverages   Alcohol. Sweetened drinks (such as sodas, lemonade, and fruit drinks or punches). Fats and oils   Butter, stick margarine, lard, shortening, ghee, or bacon fat.  Coconut, palm kernel, or palm oils. Sweets and desserts   Corn syrup, sugars, honey, and molasses. Candy. Jam and jelly. Syrup. Sweetened cereals. Cookies, pies, cakes, donuts, muffins, and ice cream. The items listed above may not be a complete list of foods and beverages to avoid. Contact your dietitian for more information.  This information is not intended to replace advice given to you by your health care provider. Make sure you discuss any questions you have with your health care provider. Document Released: 12/24/2004 Document Revised: 01/14/2014 Document Reviewed: 03/24/2013 Elsevier Interactive Patient Education  2017 [place] Prevention in the Home Falls can cause injuries. They can happen to people of all ages. There are many things you can do to make your home safe and to help prevent falls. What can I do on the outside of my home?  Regularly fix the edges of walkways and driveways and fix any cracks.  Remove anything that might make you trip as you walk through a door, such as a raised step or threshold.  Trim any bushes or trees on the path to your home.  Use bright outdoor lighting.  Clear any walking paths of anything that might make someone trip, such as rocks or tools.  Regularly check to see if handrails are loose or broken. Make sure that both sides of any steps have handrails.  Any raised decks and porches should have guardrails on the edges.  Have any leaves, snow, or ice cleared regularly.  Use sand or salt on walking paths during winter.  Clean up any spills in your garage right  away. This includes oil or grease spills. What can I do in the bathroom?  Use night lights.  Install grab bars by the toilet and in the tub and shower. Do not use towel bars as grab bars.  Use non-skid mats or decals in the tub or shower.  If you need to sit down in the shower, use a plastic, non-slip stool.  Keep the floor dry. Clean up any water that spills on  the floor as soon as it happens.  Remove soap buildup in the tub or shower regularly.  Attach bath mats securely with double-sided non-slip rug tape.  Do not have throw rugs and other things on the floor that can make you trip. What can I do in the bedroom?  Use night lights.  Make sure that you have a light by your bed that is easy to reach.  Do not use any sheets or blankets that are too big for your bed. They should not hang down onto the floor.  Have a firm chair that has side arms. You can use this for support while you get dressed.  Do not have throw rugs and other things on the floor that can make you trip. What can I do in the kitchen?  Clean up any spills right away.  Avoid walking on wet floors.  Keep items that you use a lot in easy-to-reach places.  If you need to reach something above you, use a strong step stool that has a grab bar.  Keep electrical cords out of the way.  Do not use floor polish or wax that makes floors slippery. If you must use wax, use non-skid floor wax.  Do not have throw rugs and other things on the floor that can make you trip. What can I do with my stairs?  Do not leave any items on the stairs.  Make sure that there are handrails on both sides of the stairs and use them. Fix handrails that are broken or loose. Make sure that handrails are as long as the stairways.  Check any carpeting to make sure that it is firmly attached to the stairs. Fix any carpet that is loose or worn.  Avoid having throw rugs at the top or bottom of the stairs. If you do have throw rugs, attach them to the floor with carpet tape.  Make sure that you have a light switch at the top of the stairs and the bottom of the stairs. If you do not have them, ask someone to add them for you. What else can I do to help prevent falls?  Wear shoes that:  Do not have high heels.  Have rubber bottoms.  Are comfortable and fit you well.  Are closed at the toe. Do not  wear sandals.  If you use a stepladder:  Make sure that it is fully opened. Do not climb a closed stepladder.  Make sure that both sides of the stepladder are locked into place.  Ask someone to hold it for you, if possible.  Clearly mark and make sure that you can see:  Any grab bars or handrails.  First and last steps.  Where the edge of each step is.  Use tools that help you move around (mobility aids) if they are needed. These include:  Canes.  Walkers.  Scooters.  Crutches.  Turn on the lights when you go into a dark area. Replace any light bulbs as soon as they burn out.  Set up your  furniture so you have a clear path. Avoid moving your furniture around.  If any of your floors are uneven, fix them.  If there are any pets around you, be aware of where they are.  Review your medicines with your doctor. Some medicines can make you feel dizzy. This can increase your chance of falling. Ask your doctor what other things that you can do to help prevent falls. This information is not intended to replace advice given to you by your health care provider. Make sure you discuss any questions you have with your health care provider. Document Released: 10/20/2008 Document Revised: 06/01/2015 Document Reviewed: 01/28/2014 Elsevier Interactive Patient Education  2017 [employer].

## 2016-04-23 ENCOUNTER — Ambulatory Visit (INDEPENDENT_AMBULATORY_CARE_PROVIDER_SITE_OTHER): Payer: Medicare Other

## 2016-04-23 DIAGNOSIS — D518 Other vitamin B12 deficiency anemias: Secondary | ICD-10-CM

## 2016-04-23 MED ORDER — CYANOCOBALAMIN 1000 MCG/ML IJ SOLN
1000.0000 ug | Freq: Once | INTRAMUSCULAR | Status: AC
  Administered 2016-04-23: 1000 ug via INTRAMUSCULAR

## 2016-05-20 ENCOUNTER — Ambulatory Visit: Payer: Medicare Other | Admitting: Internal Medicine

## 2016-05-23 ENCOUNTER — Ambulatory Visit (INDEPENDENT_AMBULATORY_CARE_PROVIDER_SITE_OTHER): Payer: Medicare Other

## 2016-05-23 DIAGNOSIS — D518 Other vitamin B12 deficiency anemias: Secondary | ICD-10-CM | POA: Diagnosis not present

## 2016-05-23 MED ORDER — CYANOCOBALAMIN 1000 MCG/ML IJ SOLN
1000.0000 ug | Freq: Once | INTRAMUSCULAR | Status: AC
Start: 2016-05-23 — End: 2016-05-23
  Administered 2016-05-23: 1000 ug via INTRAMUSCULAR

## 2016-05-29 ENCOUNTER — Encounter: Payer: Self-pay | Admitting: Internal Medicine

## 2016-05-29 ENCOUNTER — Ambulatory Visit (INDEPENDENT_AMBULATORY_CARE_PROVIDER_SITE_OTHER): Payer: Medicare Other | Admitting: Internal Medicine

## 2016-05-29 ENCOUNTER — Other Ambulatory Visit (INDEPENDENT_AMBULATORY_CARE_PROVIDER_SITE_OTHER): Payer: Medicare Other

## 2016-05-29 VITALS — BP 130/70 | HR 70 | Temp 97.9°F | Resp 16 | Ht 72.0 in | Wt 208.6 lb

## 2016-05-29 DIAGNOSIS — I2510 Atherosclerotic heart disease of native coronary artery without angina pectoris: Secondary | ICD-10-CM | POA: Diagnosis not present

## 2016-05-29 DIAGNOSIS — I70213 Atherosclerosis of native arteries of extremities with intermittent claudication, bilateral legs: Secondary | ICD-10-CM

## 2016-05-29 DIAGNOSIS — IMO0002 Reserved for concepts with insufficient information to code with codable children: Secondary | ICD-10-CM

## 2016-05-29 DIAGNOSIS — D518 Other vitamin B12 deficiency anemias: Secondary | ICD-10-CM

## 2016-05-29 DIAGNOSIS — I129 Hypertensive chronic kidney disease with stage 1 through stage 4 chronic kidney disease, or unspecified chronic kidney disease: Secondary | ICD-10-CM | POA: Diagnosis not present

## 2016-05-29 DIAGNOSIS — E785 Hyperlipidemia, unspecified: Secondary | ICD-10-CM

## 2016-05-29 DIAGNOSIS — E1165 Type 2 diabetes mellitus with hyperglycemia: Secondary | ICD-10-CM

## 2016-05-29 DIAGNOSIS — E1151 Type 2 diabetes mellitus with diabetic peripheral angiopathy without gangrene: Secondary | ICD-10-CM | POA: Diagnosis not present

## 2016-05-29 LAB — COMPREHENSIVE METABOLIC PANEL
ALT: 6 U/L (ref 0–53)
AST: 12 U/L (ref 0–37)
Albumin: 4 g/dL (ref 3.5–5.2)
Alkaline Phosphatase: 89 U/L (ref 39–117)
BILIRUBIN TOTAL: 0.4 mg/dL (ref 0.2–1.2)
BUN: 38 mg/dL — ABNORMAL HIGH (ref 6–23)
CO2: 24 meq/L (ref 19–32)
CREATININE: 2.68 mg/dL — AB (ref 0.40–1.50)
Calcium: 9.5 mg/dL (ref 8.4–10.5)
Chloride: 113 mEq/L — ABNORMAL HIGH (ref 96–112)
GFR: 29.39 mL/min — ABNORMAL LOW (ref >60.00)
Glucose, Bld: 148 mg/dL — ABNORMAL HIGH (ref 70–99)
Potassium: 4.3 mEq/L (ref 3.5–5.1)
Sodium: 144 mEq/L (ref 135–145)
Total Protein: 7.1 g/dL (ref 6.0–8.3)

## 2016-05-29 LAB — LIPID PANEL
CHOL/HDL RATIO: 6
Cholesterol: 166 mg/dL (ref 0–200)
HDL: 29.5 mg/dL — ABNORMAL LOW (ref >39.00)
LDL Cholesterol: 104 mg/dL — ABNORMAL HIGH (ref 0–99)
NONHDL: 136.36
TRIGLYCERIDES: 163 mg/dL — AB (ref 0.0–149.0)
VLDL: 32.6 mg/dL (ref 0.0–40.0)

## 2016-05-29 LAB — URINALYSIS, ROUTINE W REFLEX MICROSCOPIC
Bilirubin Urine: NEGATIVE
KETONES UR: NEGATIVE
Leukocytes, UA: NEGATIVE
Nitrite: NEGATIVE
PH: 6 (ref 5.0–8.0)
SPECIFIC GRAVITY, URINE: 1.015 (ref 1.000–1.030)
Total Protein, Urine: NEGATIVE
URINE GLUCOSE: NEGATIVE
UROBILINOGEN UA: 0.2 (ref 0.0–1.0)

## 2016-05-29 LAB — CBC WITH DIFFERENTIAL/PLATELET
BASOS ABS: 0 10*3/uL (ref 0.0–0.1)
Basophils Relative: 0.4 % (ref 0.0–3.0)
Eosinophils Absolute: 0.2 10*3/uL (ref 0.0–0.7)
Eosinophils Relative: 2.3 % (ref 0.0–5.0)
HCT: 40.9 % (ref 39.0–52.0)
Hemoglobin: 13.3 g/dL (ref 13.0–17.0)
LYMPHS ABS: 2.1 10*3/uL (ref 0.7–4.0)
Lymphocytes Relative: 26.1 % (ref 12.0–46.0)
MCHC: 32.4 g/dL (ref 30.0–36.0)
MCV: 93.1 fl (ref 78.0–100.0)
Monocytes Absolute: 0.8 10*3/uL (ref 0.1–1.0)
Monocytes Relative: 10.6 % (ref 3.0–12.0)
NEUTROS ABS: 4.8 10*3/uL (ref 1.4–7.7)
Neutrophils Relative %: 60.6 % (ref 43.0–77.0)
PLATELETS: 86 10*3/uL — AB (ref 150.0–400.0)
RBC: 4.39 Mil/uL (ref 4.22–5.81)
RDW: 16.5 % — ABNORMAL HIGH (ref 11.5–15.5)
WBC: 8 10*3/uL (ref 4.0–10.5)

## 2016-05-29 LAB — HEMOGLOBIN A1C: HEMOGLOBIN A1C: 8.4 % — AB (ref 4.6–6.5)

## 2016-05-29 LAB — TSH: TSH: 0.99 u[IU]/mL (ref 0.35–4.50)

## 2016-05-29 LAB — MICROALBUMIN / CREATININE URINE RATIO
Creatinine,U: 167.9 mg/dL
Microalb Creat Ratio: 1.3 mg/g (ref 0.0–30.0)
Microalb, Ur: 2.1 mg/dL — ABNORMAL HIGH (ref 0.0–1.9)

## 2016-05-29 NOTE — Patient Instructions (Signed)

## 2016-05-29 NOTE — Progress Notes (Signed)
Subjective:  Patient ID: Robert Clayton, male    DOB: 03-11-1930  Age: 83 y.o. MRN: [id number]  CC: Diabetes; Hypertension; Hyperlipidemia; and Coronary Artery Disease   HPI Robert Clayton presents for f/up - he feels well and offers no complaints.  Outpatient Medications Prior to Visit  Medication Sig Dispense Refill  . acetaminophen (TYLENOL) 325 MG tablet Take 650 mg by mouth every 6 (six) hours as needed.    [name] allopurinol (ZYLOPRIM) 300 MG tablet TAKE 1 TABLET (300 MG TOTAL) BY MOUTH DAILY. 90 tablet 0  . atorvastatin (LIPITOR) 10 MG tablet Take 1 tablet (10 mg total) by mouth daily. 90 tablet 3  . B-D INS SYR ULTRAFINE 1CC/30G 30G X 1/2" 1 ML MISC USE TWICE DAILY 100 each 11  . [facility] ASPIRIN LOW DOSE 81 MG EC tablet TAKE 1 TABLET (81 MG TOTAL) BY MOUTH DAILY. 90 tablet 3  . diphenhydrAMINE (BENADRYL) 25 mg capsule Take 25 mg by mouth at bedtime as needed for sleep.    . isosorbide mononitrate (IMDUR) 30 MG 24 hr tablet TAKE 1 TABLET (30 MG TOTAL) BY MOUTH DAILY. 30 tablet 5  . linaclotide (LINZESS) 72 MCG capsule Take 1 capsule (72 mcg total) by mouth daily before breakfast. 90 capsule 1  . losartan (COZAAR) 100 MG tablet Take 1 tablet (100 mg total) by mouth daily. 90 tablet 3  . metoprolol (LOPRESSOR) 50 MG tablet TAKE 1 TABLET TWICE A DAY 180 tablet 1  . nitroGLYCERIN (NITROSTAT) 0.4 MG SL tablet Place 1 tablet (0.4 mg total) under the tongue every 5 (five) minutes as needed. x3 doses as needed for chest pain 30 tablet 0  . NOVOLIN 70/30 (70-30) 100 UNIT/ML injection 65 units with breakfast, and 35 units with supper. 60 mL 11  . omeprazole (PRILOSEC) 40 MG capsule Take 40 mg by mouth daily.    Robert Clayton DELICA LANCETS 79K MISC TEST 2 TIMES DAILY. DX: E11.51 100 each 0   No facility-administered medications prior to visit.     ROS Review of Systems  Constitutional: Negative.  Negative for appetite change, fatigue and fever.  HENT: Negative.  Negative for trouble swallowing.     Eyes: Negative for visual disturbance.  Respiratory: Negative for cough, chest tightness, shortness of breath and wheezing.   Cardiovascular: Negative for chest pain, palpitations and leg swelling.  Gastrointestinal: Negative for abdominal pain, constipation, diarrhea, nausea and vomiting.  Endocrine: Negative for polydipsia, polyphagia and polyuria.  Genitourinary: Negative.  Negative for decreased urine volume and difficulty urinating.  Musculoskeletal: Negative for back pain and myalgias.  Skin: Negative.   Allergic/Immunologic: Negative.   Neurological: Negative.  Negative for dizziness.  Hematological: Negative for adenopathy. Does not bruise/bleed easily.  Psychiatric/Behavioral: Negative.     Objective:  BP 130/70 (BP Location: Left Arm, Patient Position: Sitting, Cuff Size: Normal)   Pulse 70   Temp 97.9 F (36.6 C) (Oral)   Resp 16   Ht 6' (1.829 m)   Wt 208 lb 10 oz (94.6 kg)   SpO2 99%   BMI 28.29 kg/m   BP Readings from Last 3 Encounters:  05/29/16 130/70  04/15/16 (!) 152/93  03/06/16 126/70    Wt Readings from Last 3 Encounters:  05/29/16 208 lb 10 oz (94.6 kg)  04/15/16 212 lb (96.2 kg)  03/06/16 215 lb (97.5 kg)    Physical Exam  Constitutional: He is oriented to person, place, and time. No distress.  HENT:  Mouth/Throat: Oropharynx is clear and moist.  No oropharyngeal exudate.  Eyes: Conjunctivae are normal. Right eye exhibits no discharge. Left eye exhibits no discharge. No scleral icterus.  Neck: Normal range of motion. Neck supple. No JVD present. No tracheal deviation present. No thyromegaly present.  Cardiovascular: Normal rate, regular rhythm, S1 normal and S2 normal.  Exam reveals no gallop and no friction rub.   Murmur heard.  Systolic murmur is present with a grade of 1/6   No diastolic murmur is present  Pulmonary/Chest: Effort normal and breath sounds normal. No stridor. No respiratory distress. He has no wheezes. He exhibits no  tenderness.  Abdominal: Soft. Bowel sounds are normal. He exhibits no distension and no mass. There is no tenderness. There is no rebound and no guarding.  Musculoskeletal: Normal range of motion. He exhibits no edema, tenderness or deformity.  Lymphadenopathy:    He has no cervical adenopathy.  Neurological: He is oriented to person, place, and time.  Skin: Skin is warm and dry. No rash noted. He is not diaphoretic. No erythema. No pallor.    Lab Results  Component Value Date   WBC 8.0 05/29/2016   HGB 13.3 05/29/2016   HCT 40.9 05/29/2016   PLT 86.0 (L) 05/29/2016   GLUCOSE 148 (H) 05/29/2016   CHOL 166 05/29/2016   TRIG 163.0 (H) 05/29/2016   HDL 29.50 (L) 05/29/2016   LDLDIRECT 80.6 06/26/2010   LDLCALC 104 (H) 05/29/2016   ALT 6 05/29/2016   AST 12 05/29/2016   NA 144 05/29/2016   K 4.3 05/29/2016   CL 113 (H) 05/29/2016   CREATININE 2.68 (H) 05/29/2016   BUN 38 (H) 05/29/2016   CO2 24 05/29/2016   TSH 0.99 05/29/2016   PSA 0.03 (L) 06/29/2009   INR 1.1 04/08/2008   HGBA1C 8.4 (H) 05/29/2016   MICROALBUR 2.1 (H) 05/29/2016    Dg Lumbar Spine Complete  Result Date: 05/11/2014 CLINICAL DATA:  Five 6 years of low back pain, patient has sustained a fall yesterday ; history of diabetes, prostate malignancy. EXAM: LUMBAR SPINE - COMPLETE 4+ VIEW COMPARISON:  Coronal and sagittal reconstructed images from an abdominal and pelvic CT scan dated April 30, 2013 FINDINGS: The lumbar vertebral bodies are preserved in height. The L4-5 disc space exhibits mild narrowing. There is facet joint hypertrophy at L4-5 and at L5-S1. There is no spondylolisthesis. The pedicles and transverse processes are intact. The observed portions of the sacrum exhibit no acute abnormalities. IMPRESSION: There is no acute compression fracture. There is no dislocation. There is mild degenerative disc space narrowing at L4-5 with facet joint hypertrophy at L4-5 and L5-S1. Electronically Signed   By: David  Martinique  M.D.   On: 05/11/2014 16:12    Assessment & Plan:   Robert Clayton was seen today for diabetes, hypertension, hyperlipidemia and coronary artery disease.  Diagnoses and all orders for this visit:  Atherosclerosis of native coronary artery of native heart without angina pectoris- he's had no recent episodes of angina, will continue risk factor modification. -     Lipid panel; Future  DM (diabetes mellitus), type 2, uncontrolled, periph vascular complic ([place])- his J1B is up-to-date 0.4%, he tells me this is the best control he can get for his blood sugar and will try to improve on his lifestyle modifications. -     Comprehensive metabolic panel; Future -     Hemoglobin A1c; Future -     Microalbumin / creatinine urine ratio; Future  Hyperlipidemia with target LDL less than 70- he has achieved  his LDL goal and is doing well on the statin. -     Lipid panel; Future -     Comprehensive metabolic panel; Future -     TSH; Future  ANEMIA, B12 DEFICIENCY- his H&H are normal now, will continue B12 replacement therapy. -     CBC with Differential/Platelet; Future  Hypertensive renal disease- his blood pressure is adequately well controlled, he will avoid nephrotoxic agents, he has had a slight decline in his renal function but nothing that requires urgent intervention. -     Comprehensive metabolic panel; Future -     Urinalysis, Routine w reflex microscopic; Future   I am having Robert Clayton maintain his atorvastatin, nitroGLYCERIN, losartan, omeprazole, linaclotide, B-D INS SYR ULTRAFINE 1CC/30G, metoprolol tartrate, isosorbide mononitrate, NOVOLIN 70/30, allopurinol, ONETOUCH DELICA LANCETS 73A, [facility]S ASPIRIN LOW DOSE, acetaminophen, and diphenhydrAMINE.  No orders of the defined types were placed in this encounter.    Follow-up: Return in about 6 months (around 11/29/2016).  [name], MD

## 2016-06-04 ENCOUNTER — Ambulatory Visit (INDEPENDENT_AMBULATORY_CARE_PROVIDER_SITE_OTHER): Payer: Medicare Other | Admitting: Endocrinology

## 2016-06-04 ENCOUNTER — Encounter: Payer: Self-pay | Admitting: Endocrinology

## 2016-06-04 VITALS — BP 136/78 | HR 65 | Ht 72.0 in | Wt 210.0 lb

## 2016-06-04 DIAGNOSIS — I70213 Atherosclerosis of native arteries of extremities with intermittent claudication, bilateral legs: Secondary | ICD-10-CM

## 2016-06-04 DIAGNOSIS — E1151 Type 2 diabetes mellitus with diabetic peripheral angiopathy without gangrene: Secondary | ICD-10-CM

## 2016-06-04 DIAGNOSIS — E1165 Type 2 diabetes mellitus with hyperglycemia: Secondary | ICD-10-CM

## 2016-06-04 DIAGNOSIS — IMO0002 Reserved for concepts with insufficient information to code with codable children: Secondary | ICD-10-CM

## 2016-06-04 MED ORDER — NOVOLIN 70/30 (70-30) 100 UNIT/ML ~~LOC~~ SUSP: SUBCUTANEOUS | 3 refills | Status: DC
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
## 2016-06-04 NOTE — Patient Instructions (Addendum)
check your blood sugar twice a day.  vary the time of day when you check, between before the 3 meals, and at bedtime.  also check if you have symptoms of your blood sugar being too high or too low.  please keep a record of the readings and bring it to your next appointment here.  please call us sooner if your blood sugar goes below 70, or if you have a lot of readings over 200.    Please increase the insulin to 75 units with breakfast, and 35 units with supper.  Please take these numbers no matter what your blood sugar is.   On this type of insulin schedule, you should eat meals on a regular schedule (especially lunch).  If a meal is missed or significantly delayed, your blood sugar could go low.   Please come back for a follow-up appointment in 3 months.

## 2016-06-18 DIAGNOSIS — L602 Onychogryphosis: Secondary | ICD-10-CM | POA: Diagnosis not present

## 2016-06-18 DIAGNOSIS — E1351 Other specified diabetes mellitus with diabetic peripheral angiopathy without gangrene: Secondary | ICD-10-CM | POA: Diagnosis not present

## 2016-06-18 DIAGNOSIS — L84 Corns and callosities: Secondary | ICD-10-CM | POA: Diagnosis not present

## 2016-06-25 ENCOUNTER — Ambulatory Visit (INDEPENDENT_AMBULATORY_CARE_PROVIDER_SITE_OTHER): Payer: Medicare Other

## 2016-06-25 DIAGNOSIS — E538 Deficiency of other specified B group vitamins: Secondary | ICD-10-CM

## 2016-06-25 MED ORDER — CYANOCOBALAMIN 1000 MCG/ML IJ SOLN
1000.0000 ug | Freq: Once | INTRAMUSCULAR | Status: AC
  Administered 2016-06-25: 1000 ug via INTRAMUSCULAR

## 2016-06-27 ENCOUNTER — Other Ambulatory Visit: Payer: Self-pay | Admitting: Internal Medicine

## 2016-07-01 ENCOUNTER — Other Ambulatory Visit: Payer: Self-pay | Admitting: Internal Medicine

## 2016-07-25 ENCOUNTER — Ambulatory Visit (INDEPENDENT_AMBULATORY_CARE_PROVIDER_SITE_OTHER): Payer: Medicare Other

## 2016-07-25 DIAGNOSIS — D518 Other vitamin B12 deficiency anemias: Secondary | ICD-10-CM

## 2016-07-25 MED ORDER — CYANOCOBALAMIN 1000 MCG/ML IJ SOLN
1000.0000 ug | Freq: Once | INTRAMUSCULAR | Status: AC
  Administered 2016-07-25: 1000 ug via INTRAMUSCULAR

## 2016-08-27 ENCOUNTER — Ambulatory Visit (INDEPENDENT_AMBULATORY_CARE_PROVIDER_SITE_OTHER): Payer: Medicare Other

## 2016-08-27 DIAGNOSIS — E538 Deficiency of other specified B group vitamins: Secondary | ICD-10-CM

## 2016-08-27 MED ORDER — CYANOCOBALAMIN 1000 MCG/ML IJ SOLN
1000.0000 ug | Freq: Once | INTRAMUSCULAR | Status: AC
  Administered 2016-08-27: 1000 ug via INTRAMUSCULAR

## 2016-08-29 ENCOUNTER — Emergency Department (HOSPITAL_COMMUNITY): Payer: Medicare Other

## 2016-08-29 ENCOUNTER — Encounter (HOSPITAL_COMMUNITY): Payer: Self-pay | Admitting: Emergency Medicine

## 2016-08-29 ENCOUNTER — Emergency Department (HOSPITAL_COMMUNITY)
Admission: EM | Admit: 2016-08-29 | Discharge: 2016-08-29 | Disposition: A | Discharge status: Home / Self Care | Payer: Medicare Other | Attending: Emergency Medicine | Admitting: Emergency Medicine

## 2016-08-29 DIAGNOSIS — Z794 Long term (current) use of insulin: Secondary | ICD-10-CM | POA: Diagnosis not present

## 2016-08-29 DIAGNOSIS — S0990XA Unspecified injury of head, initial encounter: Secondary | ICD-10-CM | POA: Insufficient documentation

## 2016-08-29 DIAGNOSIS — N181 Chronic kidney disease, stage 1: Secondary | ICD-10-CM | POA: Insufficient documentation

## 2016-08-29 DIAGNOSIS — Z23 Encounter for immunization: Secondary | ICD-10-CM | POA: Diagnosis not present

## 2016-08-29 DIAGNOSIS — S0083XA Contusion of other part of head, initial encounter: Secondary | ICD-10-CM | POA: Diagnosis not present

## 2016-08-29 DIAGNOSIS — Y999 Unspecified external cause status: Secondary | ICD-10-CM | POA: Diagnosis not present

## 2016-08-29 DIAGNOSIS — W101XXA Fall (on)(from) sidewalk curb, initial encounter: Secondary | ICD-10-CM | POA: Insufficient documentation

## 2016-08-29 DIAGNOSIS — S80211A Abrasion, right knee, initial encounter: Secondary | ICD-10-CM | POA: Insufficient documentation

## 2016-08-29 DIAGNOSIS — S00411A Abrasion of right ear, initial encounter: Secondary | ICD-10-CM | POA: Diagnosis not present

## 2016-08-29 DIAGNOSIS — T07XXXA Unspecified multiple injuries, initial encounter: Secondary | ICD-10-CM

## 2016-08-29 DIAGNOSIS — I2510 Atherosclerotic heart disease of native coronary artery without angina pectoris: Secondary | ICD-10-CM | POA: Insufficient documentation

## 2016-08-29 DIAGNOSIS — E1159 Type 2 diabetes mellitus with other circulatory complications: Secondary | ICD-10-CM | POA: Diagnosis not present

## 2016-08-29 DIAGNOSIS — S0081XA Abrasion of other part of head, initial encounter: Secondary | ICD-10-CM | POA: Insufficient documentation

## 2016-08-29 DIAGNOSIS — Y9301 Activity, walking, marching and hiking: Secondary | ICD-10-CM | POA: Diagnosis not present

## 2016-08-29 DIAGNOSIS — I129 Hypertensive chronic kidney disease with stage 1 through stage 4 chronic kidney disease, or unspecified chronic kidney disease: Secondary | ICD-10-CM | POA: Diagnosis not present

## 2016-08-29 DIAGNOSIS — Y92410 Unspecified street and highway as the place of occurrence of the external cause: Secondary | ICD-10-CM | POA: Insufficient documentation

## 2016-08-29 DIAGNOSIS — S199XXA Unspecified injury of neck, initial encounter: Secondary | ICD-10-CM | POA: Diagnosis not present

## 2016-08-29 MED ORDER — TETANUS-DIPHTH-ACELL PERTUSSIS 5-2.5-18.5 LF-MCG/0.5 IM SUSP
0.5000 mL | Freq: Once | INTRAMUSCULAR | Status: AC
  Administered 2016-08-29: 0.5 mL via INTRAMUSCULAR
  Filled 2016-08-29: qty 0.5

## 2016-08-29 NOTE — ED Notes (Signed)
Right knee cleansed with saline and covered with gauze and kerlix.

## 2016-08-29 NOTE — ED Triage Notes (Signed)
Pt states he tripped and fell this afternoon causing him to hit his R eyebrow and R knee. Alert and oriented. Takes aspirin.

## 2016-08-29 NOTE — Discharge Instructions (Signed)
Use ice on the sore areas 3 or 4 times a day for 3 days.  Keep the wounds clean with soap and water twice a day and then apply a light coating of topical antibiotic, such as Neosporin.
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
## 2016-09-03 ENCOUNTER — Ambulatory Visit (INDEPENDENT_AMBULATORY_CARE_PROVIDER_SITE_OTHER): Payer: Medicare Other | Admitting: Internal Medicine

## 2016-09-03 ENCOUNTER — Encounter: Payer: Self-pay | Admitting: Internal Medicine

## 2016-09-03 ENCOUNTER — Ambulatory Visit (INDEPENDENT_AMBULATORY_CARE_PROVIDER_SITE_OTHER)
Admission: RE | Admit: 2016-09-03 | Discharge: 2016-09-03 | Disposition: A | Discharge status: Home / Self Care | Payer: Medicare Other | Source: Ambulatory Visit | Attending: Internal Medicine | Admitting: Internal Medicine

## 2016-09-03 VITALS — BP 144/78 | HR 84 | Temp 98.4°F | Ht 72.0 in | Wt 207.5 lb

## 2016-09-03 DIAGNOSIS — L602 Onychogryphosis: Secondary | ICD-10-CM | POA: Diagnosis not present

## 2016-09-03 DIAGNOSIS — M25561 Pain in right knee: Secondary | ICD-10-CM | POA: Diagnosis not present

## 2016-09-03 DIAGNOSIS — S8991XA Unspecified injury of right lower leg, initial encounter: Secondary | ICD-10-CM | POA: Insufficient documentation

## 2016-09-03 DIAGNOSIS — S0083XS Contusion of other part of head, sequela: Secondary | ICD-10-CM | POA: Diagnosis not present

## 2016-09-03 DIAGNOSIS — I70213 Atherosclerosis of native arteries of extremities with intermittent claudication, bilateral legs: Secondary | ICD-10-CM | POA: Diagnosis not present

## 2016-09-03 DIAGNOSIS — L84 Corns and callosities: Secondary | ICD-10-CM | POA: Diagnosis not present

## 2016-09-03 DIAGNOSIS — M7989 Other specified soft tissue disorders: Secondary | ICD-10-CM | POA: Diagnosis not present

## 2016-09-03 DIAGNOSIS — E1351 Other specified diabetes mellitus with diabetic peripheral angiopathy without gangrene: Secondary | ICD-10-CM | POA: Diagnosis not present

## 2016-09-03 NOTE — Patient Instructions (Signed)
Knee Sprain, Adult A knee sprain is a stretch or tear in a knee ligament. Knee ligaments are bands of tissue that connect bones in the knee to each other. What are the causes? This condition often results from:  A fall.  An injury to the knee.  What are the signs or symptoms? Symptoms of this condition include:  Trouble bending the leg.  Swelling in the knee.  Bruising around the knee.  Tenderness or pain in the knee.  Muscle spasms around the knee.  How is this diagnosed? This condition may be diagnosed based on:  A physical exam.  What happened just before you started to have symptoms.  Tests, including: ? An X-ray. This may be done to make sure no bones are broken. ? An MRI. This may be done to check if the ligament is torn. ? Stress testing of the knee. This may be done to check ligament damage.  How is this treated? Treatment for this condition may involve:  Keeping the knee still (immobilized) with a cast, brace, or splint.  Applying ice to the knee. This helps with pain and swelling.  Keeping the knee raised (elevated) above the level of your heart when you are resting. This helps with pain and swelling.  Taking medicine for pain.  Exercises to prevent or limit permanent weakness or stiffness in your knee.  Surgery to reconnect the ligament to the bone or to reconstruct it. This may be needed if the ligament tore all the way.  Follow these instructions at home: If you have a splint or brace:  Wear the splint or brace as told by your health care provider. Remove it only as told by your health care provider.  Loosen the splint or brace if your toes tingle, become numb, or turn cold and blue.  Keep the splint or brace clean.  If the splint or brace is not waterproof: ? Do not let it get wet. ? Cover it with a watertight covering when you take a bath or a shower. If you have a cast:  Do not stick anything inside the cast to scratch your skin. Doing  that increases your risk of infection.  Check the skin around the cast every day. Tell your health care provider about any concerns.  You may put lotion on dry skin around the edges of the cast. Do not put lotion on the skin underneath the cast.  Keep the cast clean.  If the cast is not waterproof: ? Do not let it get wet. ? Cover it with a watertight covering when you take a bath or a shower. Managing pain, stiffness, and swelling   If directed, put ice on the injured area. ? If you have a removable splint or brace, remove it as told by your health care provider. ? Put ice in a plastic bag. ? Place a towel between your skin and the bag or between your cast and the bag. ? Leave the ice on for 20 minutes, 2-3 times a day.  Gently move your toes often to avoid stiffness and to lessen swelling.  Elevate the injured area above the level of your heart while you are sitting or lying down.  Take over-the-counter and prescription medicines only as told by your health care provider. General instructions  Do exercises as told by your health care provider.  Keep all follow-up visits as told by your health care provider. This is important. Contact a health care provider if:  You have pain  that gets worse.  The cast, brace, or splint does not fit right.  The cast, brace, or splint gets damaged. Get help right away if:  You cannot use your injured joint to support any of your body weight (cannot bear weight).  You cannot move the injured joint.  You cannot walk more than a few steps without pain or without your knee buckling.  You have significant pain, swelling, or numbness below the cast, brace, or splint. This information is not intended to replace advice given to you by your health care provider. Make sure you discuss any questions you have with your health care provider. Document Released: 12/24/2004 Document Revised: 09/13/2015 Document Reviewed: 07/14/2015 Elsevier  Interactive Patient Education  2017 [employer].

## 2016-09-03 NOTE — Progress Notes (Signed)
Subjective:  Patient ID: Robert Clayton, male    DOB: Feb 14, 1930  Age: 83 y.o. MRN: [id number]  CC: Knee Injury   HPI [name] presents for Follow-up after recent fall. He was seen in the ED and had a normal scan of his brain and neck. He has bruising around his right eye and face that is not bothering him. Today his main complaint is swelling and pain over his right knee. He has a few abrasions. He tells me his knee was not x-rayed in the ED.  Outpatient Medications Prior to Visit  Medication Sig Dispense Refill  . acetaminophen (TYLENOL) 325 MG tablet Take 650 mg by mouth every 6 (six) hours as needed.    [name] allopurinol (ZYLOPRIM) 300 MG tablet TAKE 1 TABLET (300 MG TOTAL) BY MOUTH DAILY. 90 tablet 1  . atorvastatin (LIPITOR) 10 MG tablet Take 1 tablet (10 mg total) by mouth daily. 90 tablet 3  . B-D INS SYR ULTRAFINE 1CC/30G 30G X 1/2" 1 ML MISC USE TWICE DAILY 100 each 11  . [facility] ASPIRIN LOW DOSE 81 MG EC tablet TAKE 1 TABLET (81 MG TOTAL) BY MOUTH DAILY. 90 tablet 3  . isosorbide mononitrate (IMDUR) 30 MG 24 hr tablet TAKE 1 TABLET (30 MG TOTAL) BY MOUTH DAILY. 30 tablet 5  . linaclotide (LINZESS) 72 MCG capsule Take 1 capsule (72 mcg total) by mouth daily before breakfast. 90 capsule 1  . losartan (COZAAR) 100 MG tablet TAKE 1 TABLET EVERY DAY 90 tablet 1  . metoprolol (LOPRESSOR) 50 MG tablet TAKE 1 TABLET TWICE A DAY 180 tablet 1  . nitroGLYCERIN (NITROSTAT) 0.4 MG SL tablet Place 1 tablet (0.4 mg total) under the tongue every 5 (five) minutes as needed. x3 doses as needed for chest pain 30 tablet 0  . NOVOLIN 70/30 (70-30) 100 UNIT/ML injection 75 units with breakfast, and 35 units with supper. 110 mL 3  . omeprazole (PRILOSEC) 40 MG capsule Take 40 mg by mouth daily.    [name] DELICA LANCETS 43P MISC TEST 2 TIMES DAILY. DX: E11.51 100 each 0  . diphenhydrAMINE (BENADRYL) 25 mg capsule Take 25 mg by mouth at bedtime as needed for sleep.     No facility-administered  medications prior to visit.     ROS Review of Systems  Constitutional: Negative.  Negative for fatigue and unexpected weight change.  HENT: Negative.   Eyes: Negative.  Negative for visual disturbance.  Respiratory: Negative.  Negative for chest tightness, shortness of breath and stridor.   Cardiovascular: Negative for chest pain, palpitations and leg swelling.  Gastrointestinal: Negative for abdominal pain, diarrhea, nausea and vomiting.  Endocrine: Negative.   Genitourinary: Negative.  Negative for difficulty urinating.  Musculoskeletal: Positive for arthralgias. Negative for back pain and neck pain.  Skin: Positive for wound. Negative for color change.  Allergic/Immunologic: Negative.   Neurological: Negative for dizziness, syncope, facial asymmetry, speech difficulty, weakness, light-headedness, numbness and headaches.  Hematological: Negative for adenopathy. Does not bruise/bleed easily.  Psychiatric/Behavioral: Negative.     Objective:  BP (!) 144/78 (BP Location: Left Arm, Patient Position: Sitting, Cuff Size: Normal)   Pulse 84   Temp 98.4 F (36.9 C) (Oral)   Ht 6' (1.829 m)   Wt 207 lb 8 oz (94.1 kg)   SpO2 98%   BMI 28.14 kg/m   BP Readings from Last 3 Encounters:  09/04/16 (!) 148/78  09/03/16 (!) 144/78  08/29/16 (!) 161/71    Wt Readings from Last  3 Encounters:  09/04/16 209 lb 6.4 oz (95 kg)  09/03/16 207 lb 8 oz (94.1 kg)  06/04/16 210 lb (95.3 kg)    Physical Exam  Constitutional: No distress.  HENT:  Head: Head is with raccoon's eyes. Head is without Battle's sign, without abrasion, without contusion, without laceration, without right periorbital erythema and without left periorbital erythema.    Mouth/Throat: Oropharynx is clear and moist. No oropharyngeal exudate.  Eyes: Conjunctivae are normal. Right eye exhibits no discharge. Left eye exhibits no discharge. No scleral icterus.  Neck: Normal range of motion. Neck supple. No JVD present. No  thyromegaly present.  Cardiovascular: Normal rate, regular rhythm and intact distal pulses.  Exam reveals no gallop and no friction rub.   No murmur heard. Pulmonary/Chest: Effort normal and breath sounds normal. No respiratory distress. He has no wheezes. He has no rales. He exhibits no tenderness.  Abdominal: Soft. Bowel sounds are normal. He exhibits no distension and no mass. There is no tenderness. There is no rebound and no guarding.  Musculoskeletal:       Right knee: He exhibits decreased range of motion and swelling. He exhibits no effusion, no ecchymosis, no deformity, no erythema, normal alignment, no LCL laxity and no bony tenderness. Tenderness found. Medial joint line tenderness noted. No lateral joint line tenderness noted.       Legs: Lymphadenopathy:    He has no cervical adenopathy.  Skin: He is not diaphoretic.  Vitals reviewed.   Lab Results  Component Value Date   WBC 8.0 05/29/2016   HGB 13.3 05/29/2016   HCT 40.9 05/29/2016   PLT 86.0 (L) 05/29/2016   GLUCOSE 148 (H) 05/29/2016   CHOL 166 05/29/2016   TRIG 163.0 (H) 05/29/2016   HDL 29.50 (L) 05/29/2016   LDLDIRECT 80.6 06/26/2010   LDLCALC 104 (H) 05/29/2016   ALT 6 05/29/2016   AST 12 05/29/2016   NA 144 05/29/2016   K 4.3 05/29/2016   CL 113 (H) 05/29/2016   CREATININE 2.68 (H) 05/29/2016   BUN 38 (H) 05/29/2016   CO2 24 05/29/2016   TSH 0.99 05/29/2016   PSA 0.03 (L) 06/29/2009   INR 1.1 04/08/2008   HGBA1C 8.4 (H) 05/29/2016   MICROALBUR 2.1 (H) 05/29/2016    Ct Head Wo Contrast  Result Date: 08/29/2016 CLINICAL DATA:  Patient tripped and fell this afternoon hitting right eyebrow. Patient on aspirin. EXAM: CT HEAD WITHOUT CONTRAST CT CERVICAL SPINE WITHOUT CONTRAST TECHNIQUE: Multidetector CT imaging of the head and cervical spine was performed following the standard protocol without intravenous contrast. Multiplanar CT image reconstructions of the cervical spine were also generated. COMPARISON:   05/09/2014 head CT FINDINGS: CT HEAD FINDINGS Brain: No acute intracranial hemorrhage, midline shift or mass. No large vascular territory infarct. Minimal small vessel ischemic disease periventricular white matter. No intraparenchymal edema nor extra-axial fluid collections. No hydrocephalus. Vascular: No hyperdense vessel or unexpected calcification. Skull: Previous right parietal craniotomy. Sinuses/Orbits: No acute finding. Other: Right periorbital soft tissue swelling is noted. CT CERVICAL SPINE FINDINGS Alignment: Normal cervical lordosis. Intact craniocervical relationship. Osteoarthritic joint space narrowing of the atlantodental interval with joint space narrowing and spurring. Skull base and vertebrae: No acute fracture. No primary bone lesion or focal pathologic process. Soft tissues and spinal canal: No prevertebral fluid or swelling. No visible canal hematoma. Disc levels: Mild-to-moderate disc space narrowing C7-T1. No jumped or perched facets. No significant neural foraminal or central canal stenosis. Upper chest: Atherosclerosis of the included aortic arch.  Other: Extracranial carotid arteriosclerosis bilaterally. IMPRESSION: 1. Prior right parietal craniotomy without acute intracranial abnormality noted. Minimal small vessel ischemic disease of periventricular white matter, chronic in appearance. 2. No acute cervical spine fracture or subluxation. Electronically Signed   By: Ashley Royalty M.D.   On: 08/29/2016 20:58   Ct Cervical Spine Wo Contrast  Result Date: 08/29/2016 CLINICAL DATA:  Patient tripped and fell this afternoon hitting right eyebrow. Patient on aspirin. EXAM: CT HEAD WITHOUT CONTRAST CT CERVICAL SPINE WITHOUT CONTRAST TECHNIQUE: Multidetector CT imaging of the head and cervical spine was performed following the standard protocol without intravenous contrast. Multiplanar CT image reconstructions of the cervical spine were also generated. COMPARISON:  05/09/2014 head CT FINDINGS: CT  HEAD FINDINGS Brain: No acute intracranial hemorrhage, midline shift or mass. No large vascular territory infarct. Minimal small vessel ischemic disease periventricular white matter. No intraparenchymal edema nor extra-axial fluid collections. No hydrocephalus. Vascular: No hyperdense vessel or unexpected calcification. Skull: Previous right parietal craniotomy. Sinuses/Orbits: No acute finding. Other: Right periorbital soft tissue swelling is noted. CT CERVICAL SPINE FINDINGS Alignment: Normal cervical lordosis. Intact craniocervical relationship. Osteoarthritic joint space narrowing of the atlantodental interval with joint space narrowing and spurring. Skull base and vertebrae: No acute fracture. No primary bone lesion or focal pathologic process. Soft tissues and spinal canal: No prevertebral fluid or swelling. No visible canal hematoma. Disc levels: Mild-to-moderate disc space narrowing C7-T1. No jumped or perched facets. No significant neural foraminal or central canal stenosis. Upper chest: Atherosclerosis of the included aortic arch. Other: Extracranial carotid arteriosclerosis bilaterally. IMPRESSION: 1. Prior right parietal craniotomy without acute intracranial abnormality noted. Minimal small vessel ischemic disease of periventricular white matter, chronic in appearance. 2. No acute cervical spine fracture or subluxation. Electronically Signed   By: Ashley Royalty M.D.   On: 08/29/2016 20:58    Assessment & Plan:   Tymier was seen today for knee injury.  Diagnoses and all orders for this visit:  Right knee injury, initial encounter- he has swelling and abrasion but no joint laxity. The plain films are normal. I've asked him to rest, ice, elevate the right knee and will reevaluate this in 2-3 weeks. If it doesn't heal up soon then I may consider an MRI to see if he has torn his medial meniscus. -     DG Knee Complete 4 Views Right; Future  Facial contusion, sequela- this is healing without  complications. He was advised to keep his head elevated and apply ice as needed.   I have discontinued Mr. Seymour diphenhydrAMINE. I am also having him maintain his atorvastatin, nitroGLYCERIN, omeprazole, linaclotide, B-D INS SYR ULTRAFINE 1CC/30G, metoprolol tartrate, isosorbide mononitrate, ONETOUCH DELICA LANCETS 09O, [facility] ASPIRIN LOW DOSE, acetaminophen, NOVOLIN 70/30, losartan, and allopurinol.  No orders of the defined types were placed in this encounter.    Follow-up: Return in about 3 weeks (around 09/24/2016).  [name], MD

## 2016-09-04 ENCOUNTER — Encounter: Payer: Self-pay | Admitting: Endocrinology

## 2016-09-04 ENCOUNTER — Ambulatory Visit (INDEPENDENT_AMBULATORY_CARE_PROVIDER_SITE_OTHER): Payer: Medicare Other | Admitting: Endocrinology

## 2016-09-04 VITALS — BP 148/78 | HR 75 | Wt 209.4 lb

## 2016-09-04 DIAGNOSIS — E1151 Type 2 diabetes mellitus with diabetic peripheral angiopathy without gangrene: Secondary | ICD-10-CM | POA: Diagnosis not present

## 2016-09-04 DIAGNOSIS — E1165 Type 2 diabetes mellitus with hyperglycemia: Secondary | ICD-10-CM | POA: Diagnosis not present

## 2016-09-04 DIAGNOSIS — IMO0002 Reserved for concepts with insufficient information to code with codable children: Secondary | ICD-10-CM

## 2016-09-04 DIAGNOSIS — I70213 Atherosclerosis of native arteries of extremities with intermittent claudication, bilateral legs: Secondary | ICD-10-CM

## 2016-09-04 LAB — HEMOGLOBIN A1C: Hgb A1c MFr Bld: 8.9 % — ABNORMAL HIGH (ref 4.6–6.5)

## 2016-09-04 MED ORDER — NOVOLIN 70/30 (70-30) 100 UNIT/ML ~~LOC~~ SUSP: SUBCUTANEOUS | 3 refills | Status: DC

## 2016-09-04 NOTE — Patient Instructions (Addendum)
check your blood sugar twice a day.  vary the time of day when you check, between before the 3 meals, and at bedtime.  also check if you have symptoms of your blood sugar being too high or too low.  please keep a record of the readings and bring it to your next appointment here.  please call us sooner if your blood sugar goes below 70, or if you have a lot of readings over 200.    blood tests are requested for you today.  We'll let you know about the results.   On this type of insulin schedule, you should eat meals on a regular schedule (especially lunch).  If a meal is missed or significantly delayed, your blood sugar could go low.   Please come back for a follow-up appointment in 3 months.
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
## 2016-09-05 NOTE — Progress Notes (Signed)
Called patient to call back so I could give him new insulin dosages. I left VM for him to return the call.

## 2016-09-12 ENCOUNTER — Encounter: Payer: Self-pay | Admitting: Internal Medicine

## 2016-09-12 ENCOUNTER — Ambulatory Visit (INDEPENDENT_AMBULATORY_CARE_PROVIDER_SITE_OTHER): Payer: Medicare Other | Admitting: Internal Medicine

## 2016-09-12 ENCOUNTER — Ambulatory Visit (HOSPITAL_COMMUNITY)
Admission: RE | Admit: 2016-09-12 | Discharge: 2016-09-12 | Disposition: A | Discharge status: Home / Self Care | Payer: Medicare Other | Source: Ambulatory Visit | Attending: Internal Medicine | Admitting: Internal Medicine

## 2016-09-12 VITALS — BP 162/86 | HR 73 | Temp 98.1°F | Ht 72.0 in | Wt 214.0 lb

## 2016-09-12 DIAGNOSIS — M1731 Unilateral post-traumatic osteoarthritis, right knee: Secondary | ICD-10-CM | POA: Diagnosis not present

## 2016-09-12 DIAGNOSIS — M109 Gout, unspecified: Secondary | ICD-10-CM | POA: Insufficient documentation

## 2016-09-12 DIAGNOSIS — E1151 Type 2 diabetes mellitus with diabetic peripheral angiopathy without gangrene: Secondary | ICD-10-CM | POA: Diagnosis not present

## 2016-09-12 DIAGNOSIS — Z23 Encounter for immunization: Secondary | ICD-10-CM

## 2016-09-12 DIAGNOSIS — I70213 Atherosclerosis of native arteries of extremities with intermittent claudication, bilateral legs: Secondary | ICD-10-CM | POA: Diagnosis not present

## 2016-09-12 DIAGNOSIS — E1165 Type 2 diabetes mellitus with hyperglycemia: Secondary | ICD-10-CM | POA: Diagnosis not present

## 2016-09-12 DIAGNOSIS — IMO0002 Reserved for concepts with insufficient information to code with codable children: Secondary | ICD-10-CM

## 2016-09-12 DIAGNOSIS — M7989 Other specified soft tissue disorders: Secondary | ICD-10-CM | POA: Diagnosis not present

## 2016-09-12 MED ORDER — PREDNISONE 10 MG PO TABS: ORAL_TABLET | ORAL | 0 refills | Status: DC

## 2016-09-12 MED ORDER — TRAMADOL HCL 50 MG PO TABS: 50.0000 mg | ORAL_TABLET | Freq: Three times a day (TID) | ORAL | 0 refills | Status: DC | PRN

## 2016-09-12 MED ORDER — METHYLPREDNISOLONE ACETATE 80 MG/ML IJ SUSP
80.0000 mg | Freq: Once | INTRAMUSCULAR | Status: AC
Start: 2016-09-12 — End: 2016-09-12
  Administered 2016-09-12: 80 mg via INTRAMUSCULAR

## 2016-09-12 NOTE — Assessment & Plan Note (Signed)
Incidental but very painful today - for depomedrol IM 80, predpac asd,  to f/u any worsening symptoms or concerns

## 2016-09-12 NOTE — Assessment & Plan Note (Signed)
With higher risk fall, for tramadol prn,  to f/u any worsening symptoms or concerns

## 2016-09-12 NOTE — Assessment & Plan Note (Signed)
Cannot rule out DVT - for stat RLE venous doppler

## 2016-09-12 NOTE — Assessment & Plan Note (Signed)
I have encouraged pt to increase the AM 70/30 insulin to 60 units, cont 35 in the PM
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
## 2016-09-12 NOTE — Patient Instructions (Signed)
You had the steroid shot today  Please take all new medication as prescribed - the prednisone for the right ankle and the tramadol for pain if needed  You will be contacted regarding the referral for: Right leg venous doppler - to see [facility] now  OK to increase the 70/30 insulin to 60 units in the AM, and 35 units in the PM  Please continue all other medications as before, and refills have been done if requested.  Please have the pharmacy call with any other refills you may need.  Please continue your efforts at being more active, low cholesterol diabetic diet, and weight control.  Please keep your appointments with your specialists as you may have planned  Please see Dr Ronnald Ramp next week to follow up

## 2016-09-24 ENCOUNTER — Ambulatory Visit (INDEPENDENT_AMBULATORY_CARE_PROVIDER_SITE_OTHER): Payer: Medicare Other | Admitting: Internal Medicine

## 2016-09-24 ENCOUNTER — Encounter: Payer: Self-pay | Admitting: Internal Medicine

## 2016-09-24 VITALS — BP 122/70 | HR 83 | Temp 98.3°F | Resp 16 | Ht 72.0 in | Wt 209.5 lb

## 2016-09-24 DIAGNOSIS — I129 Hypertensive chronic kidney disease with stage 1 through stage 4 chronic kidney disease, or unspecified chronic kidney disease: Secondary | ICD-10-CM

## 2016-09-24 DIAGNOSIS — M1731 Unilateral post-traumatic osteoarthritis, right knee: Secondary | ICD-10-CM | POA: Diagnosis not present

## 2016-09-24 DIAGNOSIS — I70213 Atherosclerosis of native arteries of extremities with intermittent claudication, bilateral legs: Secondary | ICD-10-CM

## 2016-09-24 NOTE — Patient Instructions (Signed)

## 2016-09-24 NOTE — Progress Notes (Signed)
Subjective:  Patient ID: Robert Clayton, male    DOB: 1930-08-03  Age: 83 y.o. MRN: [id number]  CC: Osteoarthritis and Hypertension   HPI Zabian Swayne presents for f/up - The pain and swelling in his right knee has diminished. He sustained a fall a few weeks ago and then apparently developed a gouty arthropathy that was treated with a course of systemic steroids. He tells me he is back to his baseline and offers no new complaints today.  Outpatient Medications Prior to Visit  Medication Sig Dispense Refill  . acetaminophen (TYLENOL) 325 MG tablet Take 650 mg by mouth every 6 (six) hours as needed.    [name] allopurinol (ZYLOPRIM) 300 MG tablet TAKE 1 TABLET (300 MG TOTAL) BY MOUTH DAILY. 90 tablet 1  . atorvastatin (LIPITOR) 10 MG tablet Take 1 tablet (10 mg total) by mouth daily. 90 tablet 3  . B-D INS SYR ULTRAFINE 1CC/30G 30G X 1/2" 1 ML MISC USE TWICE DAILY 100 each 11  . [facility] ASPIRIN LOW DOSE 81 MG EC tablet TAKE 1 TABLET (81 MG TOTAL) BY MOUTH DAILY. 90 tablet 3  . isosorbide mononitrate (IMDUR) 30 MG 24 hr tablet TAKE 1 TABLET (30 MG TOTAL) BY MOUTH DAILY. 30 tablet 5  . linaclotide (LINZESS) 72 MCG capsule Take 1 capsule (72 mcg total) by mouth daily before breakfast. 90 capsule 1  . losartan (COZAAR) 100 MG tablet TAKE 1 TABLET EVERY DAY 90 tablet 1  . metoprolol (LOPRESSOR) 50 MG tablet TAKE 1 TABLET TWICE A DAY 180 tablet 1  . nitroGLYCERIN (NITROSTAT) 0.4 MG SL tablet Place 1 tablet (0.4 mg total) under the tongue every 5 (five) minutes as needed. x3 doses as needed for chest pain 30 tablet 0  . NOVOLIN 70/30 (70-30) 100 UNIT/ML injection 85 units with breakfast, and 35 units with supper. 110 mL 3  . omeprazole (PRILOSEC) 40 MG capsule Take 40 mg by mouth daily.    [name] DELICA LANCETS 32I MISC TEST 2 TIMES DAILY. DX: E11.51 100 each 0  . traMADol (ULTRAM) 50 MG tablet Take 1 tablet (50 mg total) by mouth every 8 (eight) hours as needed. 40 tablet 0  . predniSONE (DELTASONE) 10  MG tablet 3 tabs by mouth per day for 3 days,2tabs per day for 3 days,1tab per day for 3 days 18 tablet 0   No facility-administered medications prior to visit.     ROS Review of Systems  Constitutional: Negative.  Negative for diaphoresis and fatigue.  HENT: Negative.  Negative for trouble swallowing.   Eyes: Negative.  Negative for visual disturbance.  Respiratory: Negative for cough, chest tightness, shortness of breath and wheezing.   Cardiovascular: Negative for chest pain, palpitations and leg swelling.  Gastrointestinal: Negative for abdominal pain, constipation, diarrhea, nausea and vomiting.  Endocrine: Negative.   Genitourinary: Negative.  Negative for decreased urine volume and difficulty urinating.  Musculoskeletal: Positive for arthralgias. Negative for neck pain.  Skin: Negative.  Negative for color change and rash.  Allergic/Immunologic: Negative.   Neurological: Negative.  Negative for dizziness and weakness.  Hematological: Negative for adenopathy. Does not bruise/bleed easily.  Psychiatric/Behavioral: Negative.     Objective:  BP 122/70 (BP Location: Left Arm, Patient Position: Sitting, Cuff Size: Normal)   Pulse 83   Temp 98.3 F (36.8 C) (Oral)   Resp 16   Ht 6' (1.829 m)   Wt 209 lb 8 oz (95 kg)   SpO2 99%   BMI 28.41 kg/m  BP Readings from Last 3 Encounters:  09/24/16 122/70  09/12/16 (!) 162/86  09/04/16 (!) 148/78    Wt Readings from Last 3 Encounters:  09/24/16 209 lb 8 oz (95 kg)  09/12/16 214 lb (97.1 kg)  09/04/16 209 lb 6.4 oz (95 kg)    Physical Exam  Constitutional: No distress.  HENT:  Mouth/Throat: No oropharyngeal exudate.  Eyes: Conjunctivae are normal. Right eye exhibits no discharge. Left eye exhibits no discharge. No scleral icterus.  Neck: Normal range of motion. Neck supple. No JVD present. No tracheal deviation present. No thyromegaly present.  Cardiovascular: Normal rate, regular rhythm, S1 normal, S2 normal and intact  distal pulses.  Exam reveals no gallop and no friction rub.   Murmur heard.  Systolic murmur is present with a grade of 2/6   No diastolic murmur is present  2/6 SEM  Pulmonary/Chest: Effort normal and breath sounds normal. No stridor. No respiratory distress. He has no wheezes. He has no rales. He exhibits no tenderness.  Abdominal: Soft. Bowel sounds are normal. He exhibits no distension and no mass. There is no tenderness. There is no rebound and no guarding.  Musculoskeletal:       Right knee: Normal. He exhibits normal range of motion, no swelling, no effusion, no deformity, no erythema and no bony tenderness. No tenderness found.  Lymphadenopathy:    He has no cervical adenopathy.  Skin: He is not diaphoretic.  Vitals reviewed.   Lab Results  Component Value Date   WBC 8.0 05/29/2016   HGB 13.3 05/29/2016   HCT 40.9 05/29/2016   PLT 86.0 (L) 05/29/2016   GLUCOSE 148 (H) 05/29/2016   CHOL 166 05/29/2016   TRIG 163.0 (H) 05/29/2016   HDL 29.50 (L) 05/29/2016   LDLDIRECT 80.6 06/26/2010   LDLCALC 104 (H) 05/29/2016   ALT 6 05/29/2016   AST 12 05/29/2016   NA 144 05/29/2016   K 4.3 05/29/2016   CL 113 (H) 05/29/2016   CREATININE 2.68 (H) 05/29/2016   BUN 38 (H) 05/29/2016   CO2 24 05/29/2016   TSH 0.99 05/29/2016   PSA 0.03 (L) 06/29/2009   INR 1.1 04/08/2008   HGBA1C 8.9 (H) 09/04/2016   MICROALBUR 2.1 (H) 05/29/2016    No results found.  Assessment & Plan:   Obrian was seen today for osteoarthritis and hypertension.  Diagnoses and all orders for this visit:  Post-traumatic osteoarthritis of one knee, right- his pain and swelling have resolved  Hypertensive renal disease- his BP is well controlled   I have discontinued Mr. Biven predniSONE. I am also having him maintain his atorvastatin, nitroGLYCERIN, omeprazole, linaclotide, B-D INS SYR ULTRAFINE 1CC/30G, metoprolol tartrate, isosorbide mononitrate, ONETOUCH DELICA LANCETS 17G, [facility]S ASPIRIN LOW DOSE,  acetaminophen, losartan, allopurinol, NOVOLIN 70/30, and traMADol.  No orders of the defined types were placed in this encounter.    Follow-up: Return in about 4 months (around 01/24/2017).  Robert Calico, MD

## 2016-09-26 DIAGNOSIS — Z961 Presence of intraocular lens: Secondary | ICD-10-CM | POA: Diagnosis not present

## 2016-09-26 DIAGNOSIS — Z794 Long term (current) use of insulin: Secondary | ICD-10-CM | POA: Diagnosis not present

## 2016-09-26 DIAGNOSIS — E119 Type 2 diabetes mellitus without complications: Secondary | ICD-10-CM | POA: Diagnosis not present

## 2016-09-26 LAB — HM DIABETES EYE EXAM

## 2016-09-27 ENCOUNTER — Other Ambulatory Visit: Payer: Self-pay | Admitting: Internal Medicine

## 2016-09-27 DIAGNOSIS — R072 Precordial pain: Secondary | ICD-10-CM

## 2016-09-27 DIAGNOSIS — I25110 Atherosclerotic heart disease of native coronary artery with unstable angina pectoris: Secondary | ICD-10-CM

## 2016-10-01 ENCOUNTER — Ambulatory Visit (INDEPENDENT_AMBULATORY_CARE_PROVIDER_SITE_OTHER): Payer: Medicare Other

## 2016-10-01 DIAGNOSIS — D518 Other vitamin B12 deficiency anemias: Secondary | ICD-10-CM

## 2016-10-01 MED ORDER — CYANOCOBALAMIN 1000 MCG/ML IJ SOLN
1000.0000 ug | Freq: Once | INTRAMUSCULAR | Status: AC
  Administered 2016-10-01: 1000 ug via INTRAMUSCULAR

## 2016-10-11 ENCOUNTER — Encounter: Payer: Self-pay | Admitting: Internal Medicine

## 2016-10-20 ENCOUNTER — Other Ambulatory Visit: Payer: Self-pay | Admitting: Internal Medicine

## 2016-10-31 ENCOUNTER — Ambulatory Visit (INDEPENDENT_AMBULATORY_CARE_PROVIDER_SITE_OTHER): Payer: Medicare Other

## 2016-10-31 DIAGNOSIS — E538 Deficiency of other specified B group vitamins: Secondary | ICD-10-CM | POA: Diagnosis not present

## 2016-10-31 MED ORDER — CYANOCOBALAMIN 1000 MCG/ML IJ SOLN
1000.0000 ug | Freq: Once | INTRAMUSCULAR | Status: AC
  Administered 2016-10-31: 1000 ug via INTRAMUSCULAR

## 2016-12-02 ENCOUNTER — Ambulatory Visit: Payer: Medicare Other | Admitting: Internal Medicine

## 2016-12-03 ENCOUNTER — Ambulatory Visit: Payer: Medicare Other

## 2016-12-05 ENCOUNTER — Ambulatory Visit (INDEPENDENT_AMBULATORY_CARE_PROVIDER_SITE_OTHER): Payer: Medicare Other | Admitting: Endocrinology

## 2016-12-05 ENCOUNTER — Encounter: Payer: Self-pay | Admitting: Endocrinology

## 2016-12-05 ENCOUNTER — Other Ambulatory Visit: Payer: Self-pay | Admitting: Endocrinology

## 2016-12-05 VITALS — BP 146/68 | HR 66 | Wt 208.4 lb

## 2016-12-05 DIAGNOSIS — E1151 Type 2 diabetes mellitus with diabetic peripheral angiopathy without gangrene: Secondary | ICD-10-CM

## 2016-12-05 DIAGNOSIS — E538 Deficiency of other specified B group vitamins: Secondary | ICD-10-CM | POA: Diagnosis not present

## 2016-12-05 DIAGNOSIS — I70213 Atherosclerosis of native arteries of extremities with intermittent claudication, bilateral legs: Secondary | ICD-10-CM | POA: Diagnosis not present

## 2016-12-05 DIAGNOSIS — E1165 Type 2 diabetes mellitus with hyperglycemia: Secondary | ICD-10-CM

## 2016-12-05 DIAGNOSIS — IMO0002 Reserved for concepts with insufficient information to code with codable children: Secondary | ICD-10-CM

## 2016-12-05 LAB — POCT GLYCOSYLATED HEMOGLOBIN (HGB A1C): Hemoglobin A1C: 7.9

## 2016-12-05 MED ORDER — CYANOCOBALAMIN 1000 MCG/ML IJ SOLN
1000.0000 ug | Freq: Once | INTRAMUSCULAR | Status: AC
  Administered 2016-12-05: 1000 ug via INTRAMUSCULAR
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
## 2016-12-05 NOTE — Patient Instructions (Addendum)
check your blood sugar twice a day.  vary the time of day when you check, between before the 3 meals, and at bedtime.  also check if you have symptoms of your blood sugar being too high or too low.  please keep a record of the readings and bring it to your next appointment here.  please call us sooner if your blood sugar goes below 70, or if you have a lot of readings over 200.    On this type of insulin schedule, you should eat meals on a regular schedule (especially lunch).  If a meal is missed or significantly delayed, your blood sugar could go low.   Please come back for a follow-up appointment in 4 months.

## 2016-12-26 ENCOUNTER — Other Ambulatory Visit: Payer: Self-pay | Admitting: Internal Medicine

## 2016-12-27 ENCOUNTER — Other Ambulatory Visit: Payer: Self-pay | Admitting: Internal Medicine

## 2017-01-02 ENCOUNTER — Ambulatory Visit (INDEPENDENT_AMBULATORY_CARE_PROVIDER_SITE_OTHER): Payer: Medicare Other

## 2017-01-02 DIAGNOSIS — E538 Deficiency of other specified B group vitamins: Secondary | ICD-10-CM

## 2017-01-02 MED ORDER — CYANOCOBALAMIN 1000 MCG/ML IJ SOLN
1000.0000 ug | Freq: Once | INTRAMUSCULAR | Status: AC
  Administered 2017-01-02: 1000 ug via INTRAMUSCULAR

## 2017-01-03 ENCOUNTER — Ambulatory Visit: Payer: Medicare Other | Admitting: Family Medicine

## 2017-01-16 ENCOUNTER — Other Ambulatory Visit: Payer: Self-pay | Admitting: Internal Medicine

## 2017-01-18 ENCOUNTER — Other Ambulatory Visit: Payer: Self-pay | Admitting: Internal Medicine

## 2017-01-20 ENCOUNTER — Telehealth: Payer: Self-pay

## 2017-01-20 ENCOUNTER — Telehealth: Payer: Self-pay | Admitting: Internal Medicine

## 2017-01-20 MED ORDER — GLUCOSE BLOOD VI STRP: ORAL_STRIP | 3 refills | Status: DC

## 2017-01-20 NOTE — Telephone Encounter (Signed)
Closing note. This has been taken care of.

## 2017-01-20 NOTE — Telephone Encounter (Signed)
Copied from [place] [id number]. Topic: Quick Communication - Rx Refill/Question >> Jan 20, 2017 11:26 AM Lolita Rieger, RMA wrote: Medication: onetouch verio test strips   Has the patient contacted their pharmacy? yes   (Agent: If no, request that the patient contact the pharmacy for the refill.)   Preferred Pharmacy (with phone number or street name[facility] [address]   Agent: Please be advised that RX refills may take up to 3 business days. We ask that you follow-up with your pharmacy.

## 2017-01-20 NOTE — Telephone Encounter (Signed)
rx request for one touch verio test strips.

## 2017-01-27 ENCOUNTER — Ambulatory Visit (INDEPENDENT_AMBULATORY_CARE_PROVIDER_SITE_OTHER): Payer: Medicare Other | Admitting: Internal Medicine

## 2017-01-27 ENCOUNTER — Encounter: Payer: Self-pay | Admitting: Internal Medicine

## 2017-01-27 ENCOUNTER — Other Ambulatory Visit (INDEPENDENT_AMBULATORY_CARE_PROVIDER_SITE_OTHER): Payer: Medicare Other

## 2017-01-27 VITALS — BP 170/82 | HR 64 | Temp 97.7°F | Resp 16 | Ht 72.0 in | Wt 210.8 lb

## 2017-01-27 DIAGNOSIS — N2889 Other specified disorders of kidney and ureter: Secondary | ICD-10-CM

## 2017-01-27 DIAGNOSIS — E1165 Type 2 diabetes mellitus with hyperglycemia: Secondary | ICD-10-CM | POA: Diagnosis not present

## 2017-01-27 DIAGNOSIS — N183 Chronic kidney disease, stage 3 unspecified: Secondary | ICD-10-CM

## 2017-01-27 DIAGNOSIS — E1151 Type 2 diabetes mellitus with diabetic peripheral angiopathy without gangrene: Secondary | ICD-10-CM

## 2017-01-27 DIAGNOSIS — I10 Essential (primary) hypertension: Secondary | ICD-10-CM | POA: Diagnosis not present

## 2017-01-27 DIAGNOSIS — IMO0002 Reserved for concepts with insufficient information to code with codable children: Secondary | ICD-10-CM

## 2017-01-27 DIAGNOSIS — I129 Hypertensive chronic kidney disease with stage 1 through stage 4 chronic kidney disease, or unspecified chronic kidney disease: Secondary | ICD-10-CM | POA: Diagnosis not present

## 2017-01-27 DIAGNOSIS — D518 Other vitamin B12 deficiency anemias: Secondary | ICD-10-CM

## 2017-01-27 LAB — BASIC METABOLIC PANEL
BUN: 31 mg/dL — AB (ref 6–23)
CHLORIDE: 110 meq/L (ref 96–112)
CO2: 26 mEq/L (ref 19–32)
Calcium: 9.2 mg/dL (ref 8.4–10.5)
Creatinine, Ser: 2.17 mg/dL — ABNORMAL HIGH (ref 0.40–1.50)
GFR: 37.44 mL/min — ABNORMAL LOW (ref >60.00)
Glucose, Bld: 132 mg/dL — ABNORMAL HIGH (ref 70–99)
POTASSIUM: 4.5 meq/L (ref 3.5–5.1)
Sodium: 143 mEq/L (ref 135–145)

## 2017-01-27 MED ORDER — AZILSARTAN MEDOXOMIL 40 MG PO TABS: 1.0000 | ORAL_TABLET | Freq: Every day | ORAL | 0 refills | Status: DC

## 2017-01-27 MED ORDER — HYDROCHLOROTHIAZIDE 12.5 MG PO TABS: 12.5000 mg | ORAL_TABLET | Freq: Every day | ORAL | 1 refills | Status: DC

## 2017-01-27 MED ORDER — CYANOCOBALAMIN 1000 MCG/ML IJ SOLN
1000.0000 ug | Freq: Once | INTRAMUSCULAR | Status: AC
  Administered 2017-01-27: 1000 ug via INTRAMUSCULAR

## 2017-01-27 MED ORDER — TELMISARTAN 80 MG PO TABS: 80.0000 mg | ORAL_TABLET | Freq: Every day | ORAL | 1 refills | Status: DC

## 2017-01-27 NOTE — Progress Notes (Signed)
Subjective:  Patient ID: Robert Clayton, male    DOB: 08/14/1930  Age: 84 y.o. MRN: [id number]  CC: Hypertension and Diabetes   HPI Robert Clayton presents for f/up - He is concerned that his blood pressure is not well controlled and he has had a mild, diffuse headache over the last week.  He denies blurred vision, nausea, vomiting, chest pain, shortness of breath, palpitations, edema, or fatigue.  He tells me he is compliant with losartan and metoprolol.  Outpatient Medications Prior to Visit  Medication Sig Dispense Refill  . omeprazole (PRILOSEC) 40 MG capsule Take 1 capsule (40 mg total) by mouth daily. 90 capsule 1  . acetaminophen (TYLENOL) 325 MG tablet Take 650 mg by mouth every 6 (six) hours as needed.    [name] allopurinol (ZYLOPRIM) 300 MG tablet TAKE 1 TABLET (300 MG TOTAL) BY MOUTH DAILY. 90 tablet 1  . atorvastatin (LIPITOR) 10 MG tablet Take 1 tablet (10 mg total) by mouth daily. 90 tablet 3  . B-D INS SYR ULTRAFINE 1CC/30G 30G X 1/2" 1 ML MISC USE AS DIRECTED TWICE A DAY 200 each 3  . [facility] ASPIRIN LOW DOSE 81 MG EC tablet TAKE 1 TABLET (81 MG TOTAL) BY MOUTH DAILY. 90 tablet 3  . glucose blood (COOL BLOOD GLUCOSE TEST STRIPS) test strip Use to test blood sugar bid. DX E11.51 200 each 3  . isosorbide mononitrate (IMDUR) 30 MG 24 hr tablet TAKE 1 TABLET (30 MG TOTAL) BY MOUTH DAILY. 30 tablet 5  . linaclotide (LINZESS) 72 MCG capsule Take 1 capsule (72 mcg total) by mouth daily before breakfast. (Patient not taking: Reported on 12/05/2016) 90 capsule 1  . metoprolol tartrate (LOPRESSOR) 50 MG tablet TAKE 1 TABLET TWICE A DAY 180 tablet 1  . nitroGLYCERIN (NITROSTAT) 0.4 MG SL tablet Place 1 tablet (0.4 mg total) under the tongue every 5 (five) minutes as needed. x3 doses as needed for chest pain (Patient not taking: Reported on 01/27/2017) 30 tablet 0  . NOVOLIN 70/30 (70-30) 100 UNIT/ML injection 85 units with breakfast, and 35 units with supper. 110 mL 3  . ONETOUCH DELICA LANCETS 62I  MISC TEST 2 TIMES DAILY. DX: E11.51 100 each 0  . losartan (COZAAR) 100 MG tablet TAKE 1 TABLET EVERY DAY 90 tablet 1  . omeprazole (PRILOSEC) 40 MG capsule Take 40 mg by mouth daily.    . traMADol (ULTRAM) 50 MG tablet Take 1 tablet (50 mg total) by mouth every 8 (eight) hours as needed. (Patient not taking: Reported on 12/05/2016) 40 tablet 0   No facility-administered medications prior to visit.     ROS Review of Systems  Constitutional: Negative.  Negative for appetite change, diaphoresis and fatigue.  HENT: Negative.   Eyes: Negative.  Negative for visual disturbance.  Respiratory: Negative for apnea, cough, chest tightness, shortness of breath and wheezing.   Cardiovascular: Negative for chest pain, palpitations and leg swelling.  Gastrointestinal: Negative for abdominal pain.  Endocrine: Negative.   Genitourinary: Negative.  Negative for difficulty urinating and hematuria.  Musculoskeletal: Negative.  Negative for back pain and neck pain.  Skin: Negative.  Negative for color change and rash.  Neurological: Positive for headaches. Negative for dizziness, tremors, facial asymmetry, weakness, light-headedness and numbness.  Hematological: Negative for adenopathy. Does not bruise/bleed easily.  Psychiatric/Behavioral: Negative.     Objective:  BP (!) 170/82 (BP Location: Left Arm, Patient Position: Sitting, Cuff Size: Normal)   Pulse 64   Temp 97.7 F (36.5 C) (  Oral)   Resp 16   Ht 6' (1.829 m)   Wt 210 lb 12 oz (95.6 kg)   SpO2 98%   BMI 28.58 kg/m   BP Readings from Last 3 Encounters:  01/27/17 (!) 170/82  12/05/16 (!) 146/68  09/24/16 122/70    Wt Readings from Last 3 Encounters:  01/27/17 210 lb 12 oz (95.6 kg)  12/05/16 208 lb 6.4 oz (94.5 kg)  09/24/16 209 lb 8 oz (95 kg)    Physical Exam  Constitutional: He is oriented to person, place, and time. No distress.  HENT:  Mouth/Throat: Oropharynx is clear and moist. No oropharyngeal exudate.  Eyes:  Conjunctivae and EOM are normal. Pupils are equal, round, and reactive to light. Left eye exhibits no discharge. No scleral icterus.  Neck: Normal range of motion. Neck supple. No JVD present. No thyromegaly present.  Cardiovascular: Normal rate, regular rhythm, normal heart sounds and intact distal pulses. Exam reveals no gallop.  No murmur heard. EKG --  Sinus  Bradycardia  WITHIN NORMAL LIMITS   Pulmonary/Chest: Effort normal and breath sounds normal. No respiratory distress. He has no wheezes. He has no rales.  Abdominal: Soft. Bowel sounds are normal. He exhibits no distension and no mass. There is no tenderness.  Musculoskeletal: Normal range of motion. He exhibits no edema, tenderness or deformity.  Lymphadenopathy:    He has no cervical adenopathy.  Neurological: He is alert and oriented to person, place, and time.  Skin: Skin is warm and dry. No rash noted. He is not diaphoretic. No erythema. No pallor.  Vitals reviewed.   Lab Results  Component Value Date   WBC 8.0 05/29/2016   HGB 13.3 05/29/2016   HCT 40.9 05/29/2016   PLT 86.0 (L) 05/29/2016   GLUCOSE 132 (H) 01/27/2017   CHOL 166 05/29/2016   TRIG 163.0 (H) 05/29/2016   HDL 29.50 (L) 05/29/2016   LDLDIRECT 80.6 06/26/2010   LDLCALC 104 (H) 05/29/2016   ALT 6 05/29/2016   AST 12 05/29/2016   NA 143 01/27/2017   K 4.5 01/27/2017   CL 110 01/27/2017   CREATININE 2.17 (H) 01/27/2017   BUN 31 (H) 01/27/2017   CO2 26 01/27/2017   TSH 0.99 05/29/2016   PSA 0.03 (L) 06/29/2009   INR 1.1 04/08/2008   HGBA1C 7.9 12/05/2016   MICROALBUR 2.1 (H) 05/29/2016    No results found.  Assessment & Plan:   Robert Clayton was seen today for hypertension and diabetes.  Diagnoses and all orders for this visit:  ANEMIA, B12 DEFICIENCY -     cyanocobalamin ((VITAMIN B-12)) injection 1,000 mcg  Hypertension, unspecified type- His EKG is negative for LVH or ischemia.  His blood pressure is not adequately well controlled.  Will  upgrade to a more potent ARB and will start a thiazide diuretic. -     EKG 12-Lead -     Discontinue: Azilsartan Medoxomil (EDARBI) 40 MG TABS; Take 1 tablet by mouth daily. -     Basic metabolic panel; Future  DM (diabetes mellitus), type 2, uncontrolled, periph vascular complic ([place])- His recent A1c was 7.9%.  His blood sugars are adequately well controlled. -     telmisartan (MICARDIS) 80 MG tablet; Take 1 tablet (80 mg total) by mouth daily. -     Basic metabolic panel; Future  Hypertensive renal disease-we will attempt to gain better blood pressure control. -     Discontinue: Azilsartan Medoxomil (EDARBI) 40 MG TABS; Take 1 tablet by mouth daily. -  telmisartan (MICARDIS) 80 MG tablet; Take 1 tablet (80 mg total) by mouth daily. -     hydrochlorothiazide (HYDRODIURIL) 12.5 MG tablet; Take 1 tablet (12.5 mg total) by mouth daily. -     Basic metabolic panel; Future  CRI (chronic renal insufficiency), stage 3 (moderate) (HCC)- His creatinine clearance is 34.  He agrees to avoid nephrotoxic agents.  Will attempt to gain better control of his blood sugar and his blood pressure.   I have discontinued Robert Clayton's omeprazole, traMADol, losartan, omeprazole, and Azilsartan Medoxomil. I am also having him start on telmisartan and hydrochlorothiazide. Additionally, I am having him maintain his atorvastatin, nitroGLYCERIN, linaclotide, [facility]S ASPIRIN LOW DOSE, acetaminophen, NOVOLIN 70/30, isosorbide mononitrate, metoprolol tartrate, B-D INS SYR ULTRAFINE 4WO/03O, ONETOUCH DELICA LANCETS 12Y, allopurinol, and glucose blood. We administered cyanocobalamin.  Meds ordered this encounter  Medications  . cyanocobalamin ((VITAMIN B-12)) injection 1,000 mcg  . DISCONTD: Azilsartan Medoxomil (EDARBI) 40 MG TABS    Sig: Take 1 tablet by mouth daily.    Dispense:  56 tablet    Refill:  0  . telmisartan (MICARDIS) 80 MG tablet    Sig: Take 1 tablet (80 mg total) by mouth daily.    Dispense:  90 tablet     Refill:  1  . hydrochlorothiazide (HYDRODIURIL) 12.5 MG tablet    Sig: Take 1 tablet (12.5 mg total) by mouth daily.    Dispense:  90 tablet    Refill:  1     Follow-up: Return in about 6 weeks (around 03/10/2017).  [name], MD

## 2017-01-27 NOTE — Patient Instructions (Signed)

## 2017-01-28 DIAGNOSIS — N183 Chronic kidney disease, stage 3 unspecified: Secondary | ICD-10-CM | POA: Insufficient documentation

## 2017-02-19 ENCOUNTER — Encounter: Payer: Self-pay | Admitting: Podiatry

## 2017-02-19 ENCOUNTER — Ambulatory Visit (INDEPENDENT_AMBULATORY_CARE_PROVIDER_SITE_OTHER): Payer: Medicare Other | Admitting: Podiatry

## 2017-02-19 VITALS — BP 187/94 | HR 73

## 2017-02-19 DIAGNOSIS — E1159 Type 2 diabetes mellitus with other circulatory complications: Secondary | ICD-10-CM | POA: Diagnosis not present

## 2017-02-19 DIAGNOSIS — B351 Tinea unguium: Secondary | ICD-10-CM | POA: Diagnosis not present

## 2017-02-19 DIAGNOSIS — M79675 Pain in left toe(s): Secondary | ICD-10-CM

## 2017-02-19 DIAGNOSIS — E1142 Type 2 diabetes mellitus with diabetic polyneuropathy: Secondary | ICD-10-CM | POA: Diagnosis not present

## 2017-02-19 DIAGNOSIS — M79674 Pain in right toe(s): Secondary | ICD-10-CM

## 2017-02-19 NOTE — Progress Notes (Signed)
   Subjective:    Patient ID: Robert Clayton, male    DOB: [birth year], 84 y.o.   MRN: [id number]  HPI this patient presents the office with chief complaint of long thick painful nails.  Patient states the nails are painful walking and wearing his shoes.  He also has pain behind the big toe nail on the left foot.  He says this is been painful for approximately 2 weeks and he is concerned about this problem.  Past medical history of diabetes with vascular disease.  He presents the office today for an evaluation and treatment of this condition  Review of Systems  All other systems reviewed and are negative.      Objective:   Physical Exam General Appearance  Alert, conversant and in no acute stress.  Vascular  Dorsalis pedis and posterior pulses are not  palpable  bilaterally.  Capillary return is within normal limits  bilaterally. Cold feet noted.  Bilaterally.  Neurologic  Senn-Weinstein monofilament wire test within normal limits  bilaterally. Muscle power within normal limits bilaterally.  Nails Thick disfigured discolored nails with subungual debris bilaterally from hallux to fifth toes bilaterally. No evidence of bacterial infection or drainage bilaterally. Tenderness at the proximal nail fold left hallux toenail.    Orthopedic  No limitations of motion of motion feet bilaterally.  No crepitus or effusions noted. No inversion or eversion noted in the rear foot complex bilaterally.  Skin  normotropic skin with no porokeratosis noted bilaterally.  No signs of infections or ulcers noted.          Assessment & Plan:  Onychomycosis Diabetes with vascular disease.  IE  Debridement of nails x 10.  As patient was leaving. He requested diabetic shoes.  He does qualify for diabetic shoes since he does have neuropathy as well as angiopathy.  Dawn pointed out paperwork that he is to bring to his medical doctor, Dr. Loanne Drilling and to return the paperwork to this office.  . Once this paperwork is  received diabetic shoes will be picked out and ordered.  Returns to the clinic in 3 months for preventative foot care services.   Gardiner Barefoot DPM

## 2017-03-03 ENCOUNTER — Ambulatory Visit (INDEPENDENT_AMBULATORY_CARE_PROVIDER_SITE_OTHER): Payer: Medicare Other

## 2017-03-03 DIAGNOSIS — E538 Deficiency of other specified B group vitamins: Secondary | ICD-10-CM | POA: Diagnosis not present

## 2017-03-03 MED ORDER — CYANOCOBALAMIN 1000 MCG/ML IJ SOLN
1000.0000 ug | Freq: Once | INTRAMUSCULAR | Status: AC
Start: 2017-03-03 — End: 2017-03-03
  Administered 2017-03-03: 1000 ug via INTRAMUSCULAR

## 2017-03-10 ENCOUNTER — Encounter: Payer: Self-pay | Admitting: Internal Medicine

## 2017-03-10 ENCOUNTER — Ambulatory Visit (INDEPENDENT_AMBULATORY_CARE_PROVIDER_SITE_OTHER): Payer: Medicare Other | Admitting: Internal Medicine

## 2017-03-10 VITALS — BP 140/80 | HR 51 | Temp 97.7°F | Ht 72.0 in | Wt 211.8 lb

## 2017-03-10 DIAGNOSIS — R072 Precordial pain: Secondary | ICD-10-CM | POA: Diagnosis not present

## 2017-03-10 DIAGNOSIS — E1151 Type 2 diabetes mellitus with diabetic peripheral angiopathy without gangrene: Secondary | ICD-10-CM | POA: Diagnosis not present

## 2017-03-10 DIAGNOSIS — IMO0002 Reserved for concepts with insufficient information to code with codable children: Secondary | ICD-10-CM

## 2017-03-10 DIAGNOSIS — I25110 Atherosclerotic heart disease of native coronary artery with unstable angina pectoris: Secondary | ICD-10-CM

## 2017-03-10 DIAGNOSIS — E1165 Type 2 diabetes mellitus with hyperglycemia: Secondary | ICD-10-CM

## 2017-03-10 LAB — POCT GLYCOSYLATED HEMOGLOBIN (HGB A1C): HEMOGLOBIN A1C: 8.4

## 2017-03-10 MED ORDER — ISOSORBIDE MONONITRATE ER 30 MG PO TB24: 30.0000 mg | ORAL_TABLET | Freq: Every day | ORAL | 5 refills | Status: DC

## 2017-03-10 MED ORDER — LINAGLIPTIN 5 MG PO TABS: 5.0000 mg | ORAL_TABLET | Freq: Every day | ORAL | 1 refills | Status: DC

## 2017-03-10 NOTE — Patient Instructions (Signed)

## 2017-03-10 NOTE — Progress Notes (Signed)
Subjective:  Patient ID: Robert Clayton, male    DOB: 11-Dec-1930  Age: 84 y.o. MRN: [id number]  CC: Diabetes   HPI Robert Clayton presents for f/up - He has not been adhering to his diet recently and is concerned that his blood sugars may have gone up.  Fortunately, he feels well and offers no complaints.  He denies any recent episodes of polys.  He has had no recent episodes of chest pain, shortness of breath, edema, or fatigue.  Outpatient Medications Prior to Visit  Medication Sig Dispense Refill  . acetaminophen (TYLENOL) 325 MG tablet Take 650 mg by mouth every 6 (six) hours as needed.    [name] allopurinol (ZYLOPRIM) 300 MG tablet TAKE 1 TABLET (300 MG TOTAL) BY MOUTH DAILY. 90 tablet 1  . atorvastatin (LIPITOR) 10 MG tablet Take 1 tablet (10 mg total) by mouth daily. 90 tablet 3  . B-D INS SYR ULTRAFINE 1CC/30G 30G X 1/2" 1 ML MISC USE AS DIRECTED TWICE A DAY 200 each 3  . [facility] ASPIRIN LOW DOSE 81 MG EC tablet TAKE 1 TABLET (81 MG TOTAL) BY MOUTH DAILY. 90 tablet 3  . glucose blood (COOL BLOOD GLUCOSE TEST STRIPS) test strip Use to test blood sugar bid. DX E11.51 200 each 3  . hydrochlorothiazide (HYDRODIURIL) 12.5 MG tablet Take 1 tablet (12.5 mg total) by mouth daily. 90 tablet 1  . linaclotide (LINZESS) 72 MCG capsule Take 1 capsule (72 mcg total) by mouth daily before breakfast. 90 capsule 1  . metoprolol tartrate (LOPRESSOR) 50 MG tablet TAKE 1 TABLET TWICE A DAY 180 tablet 1  . nitroGLYCERIN (NITROSTAT) 0.4 MG SL tablet Place 1 tablet (0.4 mg total) under the tongue every 5 (five) minutes as needed. x3 doses as needed for chest pain 30 tablet 0  . NOVOLIN 70/30 (70-30) 100 UNIT/ML injection 85 units with breakfast, and 35 units with supper. 110 mL 3  . ONETOUCH DELICA LANCETS 37S MISC TEST 2 TIMES DAILY. DX: E11.51 100 each 0  . telmisartan (MICARDIS) 80 MG tablet Take 1 tablet (80 mg total) by mouth daily. 90 tablet 1  . isosorbide mononitrate (IMDUR) 30 MG 24 hr tablet TAKE 1 TABLET  (30 MG TOTAL) BY MOUTH DAILY. 30 tablet 5   No facility-administered medications prior to visit.     ROS Review of Systems  Constitutional: Negative.  Negative for chills, diaphoresis, fatigue and fever.  HENT: Negative.  Negative for sore throat and trouble swallowing.   Eyes: Negative for visual disturbance.  Respiratory: Negative.  Negative for cough, chest tightness, shortness of breath and wheezing.   Cardiovascular: Negative for chest pain, palpitations and leg swelling.  Gastrointestinal: Negative.  Negative for abdominal pain, constipation, diarrhea, nausea and vomiting.  Endocrine: Negative.  Negative for polydipsia, polyphagia and polyuria.  Genitourinary: Negative.   Musculoskeletal: Negative.  Negative for arthralgias and myalgias.  Skin: Negative.   Allergic/Immunologic: Negative.   Neurological: Negative.  Negative for dizziness, weakness and light-headedness.  Hematological: Negative.  Negative for adenopathy. Does not bruise/bleed easily.  Psychiatric/Behavioral: Negative.     Objective:  BP 140/80 (BP Location: Left Arm, Patient Position: Sitting, Cuff Size: Large)   Pulse (!) 51   Temp 97.7 F (36.5 C) (Oral)   Ht 6' (1.829 m)   Wt 211 lb 12 oz (96 kg)   SpO2 97%   BMI 28.72 kg/m   BP Readings from Last 3 Encounters:  03/10/17 140/80  02/19/17 (!) 187/94  01/27/17 (!) 170/82  Wt Readings from Last 3 Encounters:  03/10/17 211 lb 12 oz (96 kg)  01/27/17 210 lb 12 oz (95.6 kg)  12/05/16 208 lb 6.4 oz (94.5 kg)    Physical Exam  Constitutional: He is oriented to person, place, and time. No distress.  HENT:  Mouth/Throat: Oropharynx is clear and moist. No oropharyngeal exudate.  Eyes: Conjunctivae are normal. Left eye exhibits no discharge. No scleral icterus.  Neck: Normal range of motion. Neck supple. No JVD present. No thyromegaly present.  Cardiovascular: Normal rate and regular rhythm. Exam reveals no gallop and no friction rub.  Murmur  heard.  Systolic murmur is present.  No diastolic murmur is present. 1/6 SEM  Pulmonary/Chest: Effort normal and breath sounds normal. No respiratory distress. He has no wheezes. He has no rales.  Abdominal: Soft. Bowel sounds are normal. He exhibits no distension and no mass. There is no tenderness. There is no guarding.  Musculoskeletal: Normal range of motion. He exhibits no edema, tenderness or deformity.  Lymphadenopathy:    He has no cervical adenopathy.  Neurological: He is alert and oriented to person, place, and time.  Skin: Skin is warm and dry. No rash noted. He is not diaphoretic. No erythema. No pallor.  Vitals reviewed.   Lab Results  Component Value Date   WBC 8.0 05/29/2016   HGB 13.3 05/29/2016   HCT 40.9 05/29/2016   PLT 86.0 (L) 05/29/2016   GLUCOSE 132 (H) 01/27/2017   CHOL 166 05/29/2016   TRIG 163.0 (H) 05/29/2016   HDL 29.50 (L) 05/29/2016   LDLDIRECT 80.6 06/26/2010   LDLCALC 104 (H) 05/29/2016   ALT 6 05/29/2016   AST 12 05/29/2016   NA 143 01/27/2017   K 4.5 01/27/2017   CL 110 01/27/2017   CREATININE 2.17 (H) 01/27/2017   BUN 31 (H) 01/27/2017   CO2 26 01/27/2017   TSH 0.99 05/29/2016   PSA 0.03 (L) 06/29/2009   INR 1.1 04/08/2008   HGBA1C 8.4 03/10/2017   MICROALBUR 2.1 (H) 05/29/2016    No results found.  Assessment & Plan:   Robert Clayton was seen today for diabetes.  Diagnoses and all orders for this visit:  DM (diabetes mellitus), type 2, uncontrolled, periph vascular complic ([place])- His U9W is up to 8.4%.  He has mild renal insufficiency.  I have asked him to add a DPP4 inhibitor to his insulin regimen. -     POCT glycosylated hemoglobin (Hb A1C) -     linagliptin (TRADJENTA) 5 MG TABS tablet; Take 1 tablet (5 mg total) by mouth daily.  Precordial chest pain  Atherosclerosis of native coronary artery of native heart with unstable angina pectoris [facility])- He has had no recent episodes of chest pain or shortness of breath.  He has not  recently used any additional doses of nitroglycerin.  Will continue isosorbide monohydrate at the current dose. -     isosorbide mononitrate (IMDUR) 30 MG 24 hr tablet; Take 1 tablet (30 mg total) by mouth daily.   I am having Inetta Fermo start on linagliptin. I am also having him maintain his atorvastatin, nitroGLYCERIN, linaclotide, CVS ASPIRIN LOW DOSE, acetaminophen, NOVOLIN 70/30, metoprolol tartrate, B-D INS SYR ULTRAFINE 1XB/14N, ONETOUCH DELICA LANCETS 82N, allopurinol, glucose blood, telmisartan, hydrochlorothiazide, and isosorbide mononitrate.  Meds ordered this encounter  Medications  . isosorbide mononitrate (IMDUR) 30 MG 24 hr tablet    Sig: Take 1 tablet (30 mg total) by mouth daily.    Dispense:  30 tablet  Refill:  5  . linagliptin (TRADJENTA) 5 MG TABS tablet    Sig: Take 1 tablet (5 mg total) by mouth daily.    Dispense:  90 tablet    Refill:  1     Follow-up: Return in about 4 months (around 07/10/2017).  [name], MD

## 2017-03-31 ENCOUNTER — Ambulatory Visit: Payer: Medicare Other

## 2017-04-02 ENCOUNTER — Encounter: Payer: Self-pay | Admitting: Endocrinology

## 2017-04-02 ENCOUNTER — Ambulatory Visit (INDEPENDENT_AMBULATORY_CARE_PROVIDER_SITE_OTHER): Payer: Medicare Other | Admitting: Endocrinology

## 2017-04-02 VITALS — BP 152/72 | HR 76 | Wt 216.0 lb

## 2017-04-02 DIAGNOSIS — E538 Deficiency of other specified B group vitamins: Secondary | ICD-10-CM | POA: Diagnosis not present

## 2017-04-02 DIAGNOSIS — I25110 Atherosclerotic heart disease of native coronary artery with unstable angina pectoris: Secondary | ICD-10-CM

## 2017-04-02 MED ORDER — NOVOLIN 70/30 (70-30) 100 UNIT/ML ~~LOC~~ SUSP: SUBCUTANEOUS | 3 refills | Status: DC

## 2017-04-02 MED ORDER — CYANOCOBALAMIN 1000 MCG/ML IJ SOLN
1000.0000 ug | Freq: Once | INTRAMUSCULAR | Status: AC
  Administered 2017-04-02: 1000 ug via INTRAMUSCULAR
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
## 2017-04-02 NOTE — Patient Instructions (Addendum)
Your blood pressure is high today.  Please see your primary care provider soon, to have it rechecked check your blood sugar twice a day.  vary the time of day when you check, between before the 3 meals, and at bedtime.  also check if you have symptoms of your blood sugar being too high or too low.  please keep a record of the readings and bring it to your next appointment here.  please call us sooner if your blood sugar goes below 70, or if you have a lot of readings over 200.    Please change the insulin to 95 units with breakfast, and 30 units with supper.  On this type of insulin schedule, you should eat meals on a regular schedule (especially lunch).  If a meal is missed or significantly delayed, your blood sugar could go low.   Please come back for a follow-up appointment in 2-3 months.

## 2017-04-24 ENCOUNTER — Ambulatory Visit (INDEPENDENT_AMBULATORY_CARE_PROVIDER_SITE_OTHER): Payer: Medicare Other | Admitting: *Deleted

## 2017-04-24 ENCOUNTER — Other Ambulatory Visit: Payer: Self-pay | Admitting: Internal Medicine

## 2017-04-24 VITALS — BP 164/74 | HR 84 | Resp 18 | Ht 72.0 in | Wt 209.0 lb

## 2017-04-24 DIAGNOSIS — E1165 Type 2 diabetes mellitus with hyperglycemia: Secondary | ICD-10-CM | POA: Diagnosis not present

## 2017-04-24 DIAGNOSIS — IMO0002 Reserved for concepts with insufficient information to code with codable children: Secondary | ICD-10-CM

## 2017-04-24 DIAGNOSIS — I2510 Atherosclerotic heart disease of native coronary artery without angina pectoris: Secondary | ICD-10-CM

## 2017-04-24 DIAGNOSIS — I70213 Atherosclerosis of native arteries of extremities with intermittent claudication, bilateral legs: Secondary | ICD-10-CM

## 2017-04-24 DIAGNOSIS — Z0000 Encounter for general adult medical examination without abnormal findings: Secondary | ICD-10-CM | POA: Diagnosis not present

## 2017-04-24 DIAGNOSIS — I25110 Atherosclerotic heart disease of native coronary artery with unstable angina pectoris: Secondary | ICD-10-CM

## 2017-04-24 DIAGNOSIS — E785 Hyperlipidemia, unspecified: Secondary | ICD-10-CM

## 2017-04-24 DIAGNOSIS — E1151 Type 2 diabetes mellitus with diabetic peripheral angiopathy without gangrene: Secondary | ICD-10-CM | POA: Diagnosis not present

## 2017-04-24 MED ORDER — ATORVASTATIN CALCIUM 10 MG PO TABS: 10.0000 mg | ORAL_TABLET | Freq: Every day | ORAL | 3 refills | Status: DC

## 2017-04-24 NOTE — Patient Instructions (Signed)
Continue doing brain stimulating activities (puzzles, reading, adult coloring books, staying active) to keep memory sharp.   Continue to eat heart healthy diet (full of fruits, vegetables, whole grains, lean protein, water--limit salt, fat, and sugar intake) and increase physical activity as tolerated.   [name] , Thank you for taking time to come for your Medicare Wellness Visit. I appreciate your ongoing commitment to your health goals. Please review the following plan we discussed and let me know if I can assist you in the future.   These are the goals we discussed: Goals    . Patient Stated     Maintain current health status, stay as healthy and as independent as possible. Continue to monitor sugar and carbohydrates.       This is a list of the screening recommended for you and due dates:  Health Maintenance  Topic Date Due  . Flu Shot  08/07/2017  . Eye exam for diabetics  09/26/2017  . Complete foot exam   04/03/2018  . Tetanus Vaccine  08/30/2026  . Pneumonia vaccines  Completed    Health Maintenance, Male A healthy lifestyle and preventive care is important for your health and wellness. Ask your health care provider about what schedule of regular examinations is right for you. What should I know about weight and diet? Eat a Healthy Diet  Eat plenty of vegetables, fruits, whole grains, low-fat dairy products, and lean protein.  Do not eat a lot of foods high in solid fats, added sugars, or salt.  Maintain a Healthy Weight Regular exercise can help you achieve or maintain a healthy weight. You should:  Do at least 150 minutes of exercise each week. The exercise should increase your heart rate and make you sweat (moderate-intensity exercise).  Do strength-training exercises at least twice a week.  Watch Your Levels of Cholesterol and Blood Lipids  Have your blood tested for lipids and cholesterol every 5 years starting at 82 years of age. If you are at high risk for  heart disease, you should start having your blood tested when you are 82 years old. You may need to have your cholesterol levels checked more often if: ? Your lipid or cholesterol levels are high. ? You are older than 82 years of age. ? You are at high risk for heart disease.  What should I know about cancer screening? Many types of cancers can be detected early and may often be prevented. Lung Cancer  You should be screened every year for lung cancer if: ? You are a current smoker who has smoked for at least 30 years. ? You are a former smoker who has quit within the past 15 years.  Talk to your health care provider about your screening options, when you should start screening, and how often you should be screened.  Colorectal Cancer  Routine colorectal cancer screening usually begins at 82 years of age and should be repeated every 5-10 years until you are 82 years old. You may need to be screened more often if early forms of precancerous polyps or small growths are found. Your health care provider may recommend screening at an earlier age if you have risk factors for colon cancer.  Your health care provider may recommend using home test kits to check for hidden blood in the stool.  A small camera at the end of a tube can be used to examine your colon (sigmoidoscopy or colonoscopy). This checks for the earliest forms of colorectal cancer.  Prostate and Testicular Cancer  Depending on your age and overall health, your health care provider may do certain tests to screen for prostate and testicular cancer.  Talk to your health care provider about any symptoms or concerns you have about testicular or prostate cancer.  Skin Cancer  Check your skin from head to toe regularly.  Tell your health care provider about any new moles or changes in moles, especially if: ? There is a change in a mole's size, shape, or color. ? You have a mole that is larger than a pencil eraser.  Always use  sunscreen. Apply sunscreen liberally and repeat throughout the day.  Protect yourself by wearing long sleeves, pants, a wide-brimmed hat, and sunglasses when outside.  What should I know about heart disease, diabetes, and high blood pressure?  If you are 56-78 years of age, have your blood pressure checked every 3-5 years. If you are 11 years of age or older, have your blood pressure checked every year. You should have your blood pressure measured twice-once when you are at a hospital or clinic, and once when you are not at a hospital or clinic. Record the average of the two measurements. To check your blood pressure when you are not at a hospital or clinic, you can use: ? An automated blood pressure machine at a pharmacy. ? A home blood pressure monitor.  Talk to your health care provider about your target blood pressure.  If you are between 81-23 years old, ask your health care provider if you should take aspirin to prevent heart disease.  Have regular diabetes screenings by checking your fasting blood sugar level. ? If you are at a normal weight and have a low risk for diabetes, have this test once every three years after the age of 67. ? If you are overweight and have a high risk for diabetes, consider being tested at a younger age or more often.  A one-time screening for abdominal aortic aneurysm (AAA) by ultrasound is recommended for men aged 20-75 years who are current or former smokers. What should I know about preventing infection? Hepatitis B If you have a higher risk for hepatitis B, you should be screened for this virus. Talk with your health care provider to find out if you are at risk for hepatitis B infection. Hepatitis C Blood testing is recommended for:  Everyone born from [birth year] through 1963.  Anyone with known risk factors for hepatitis C.  Sexually Transmitted Diseases (STDs)  You should be screened each year for STDs including gonorrhea and chlamydia if: ? You are  sexually active and are younger than 82 years of age. ? You are older than 82 years of age and your health care provider tells you that you are at risk for this type of infection. ? Your sexual activity has changed since you were last screened and you are at an increased risk for chlamydia or gonorrhea. Ask your health care provider if you are at risk.  Talk with your health care provider about whether you are at high risk of being infected with HIV. Your health care provider may recommend a prescription medicine to help prevent HIV infection.  What else can I do?  Schedule regular health, dental, and eye exams.  Stay current with your vaccines (immunizations).  Do not use any tobacco products, such as cigarettes, chewing tobacco, and e-cigarettes. If you need help quitting, ask your health care provider.  Limit alcohol intake to no more than 2  drinks per day. One drink equals 12 ounces of beer, 5 ounces of Avilene Marrin, or 1 ounces of hard liquor.  Do not use street drugs.  Do not share needles.  Ask your health care provider for help if you need support or information about quitting drugs.  Tell your health care provider if you often feel depressed.  Tell your health care provider if you have ever been abused or do not feel safe at home. This information is not intended to replace advice given to you by your health care provider. Make sure you discuss any questions you have with your health care provider. Document Released: 06/22/2007 Document Revised: 08/23/2015 Document Reviewed: 09/27/2014 Elsevier Interactive Patient Education  [employer].
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
## 2017-04-25 ENCOUNTER — Other Ambulatory Visit: Payer: Self-pay

## 2017-04-25 NOTE — Patient Outreach (Signed)
Robert Clayton [facility]) Care Management  04/25/2017  Robert Clayton [birth year] [id number]   Telephone Screen  Referral Date: 04/25/17 Referral Source: MD office ( Dr. Ronnald Ramp) Referral Reason: "Dm, HTN management and community resources" Insurance: Medicare   Outreach attempt # 1 to patient. Spoke with patient who reported that this was not a good time for him to talk as he was cooking and requested a call back at another time.       Plan: RN CM will make outreach attempt to patient within 3-4 business days. RN CM will send unsuccessful outreach letter to patient.   Enzo Montgomery, RN,BSN,CCM [place] Management Telephonic Care Management Coordinator Direct Phone: [phone] Toll Free: [phone] Fax: [phone]

## 2017-04-29 ENCOUNTER — Other Ambulatory Visit: Payer: Self-pay

## 2017-04-29 ENCOUNTER — Ambulatory Visit: Payer: Self-pay

## 2017-04-29 NOTE — Patient Outreach (Signed)
[facility] [place] [facility]) Care Management  04/29/2017  Robert Clayton [birth year] [id number]   Telephone Screen  Referral Date: 04/25/17 Referral Source: MD office ( Dr. Ronnald Ramp) Referral Reason: "DM, HTN management and community resources" Insurance: Medicare    Outreach attempt #2 to patient. Spoke with patient and screening completed.  Social: Patient resides in his home along with his spouse. He voices that he is independent with ADLs/IADLs. He drives himself to appts. He denies any recent falls. He voices that he has a cane but only has to use if when he ambulates long distances. Other DME in the home include cbg meter and BP monitor.   Conditions: Per chart review, patient has PMH of DM,HTN, CKD, gout, B12 deficiency. HLD and prostate CA. Patient reports that he is currently checking his BP in the home 2x/day. He reports average readings are in the 160's prior to taking his morning BP meds. Patient voiced that he thought this was a "good BP." RN CM provided some education to patient in regards to normal parameters. He states he usually checks cbgs 2/day as well but has not checked them today so far. He voices that cbgs normally range in the 200s when he checks them in the morning before meals. Again, patient under the impression that this was not a "a bad number." Patient needs further education and support in managing chronic illnesses. Last A1C was 8.4(March 2019).   Medications: Patient reports that he is taking about six meds. He denies any issues affording meds. He voices that he fills his own med planner weekly.    Appointments: He is followed by PCP and saw him last week. Patient also sees endocrinologist-Dr. Loanne Drilling.   Advance Directives: Patient states he was recently given the form but has not had a chance to complete it.  Consent: [facility] services reviewed and discussed. Patient gave verbal consent for [facility] services. Per patient request he only would like afternoon calls  only as he is not a morning person.   Plan: RN CM will send [place] Referral for further disease management, education and support.     Enzo Montgomery, RN,BSN,CCM [place] Management Telephonic Care Management Coordinator Direct Phone: [phone] Toll Free: [phone] Fax: [phone]

## 2017-04-30 ENCOUNTER — Ambulatory Visit: Payer: Self-pay

## 2017-04-30 ENCOUNTER — Encounter: Payer: Self-pay | Admitting: *Deleted

## 2017-05-06 ENCOUNTER — Ambulatory Visit (INDEPENDENT_AMBULATORY_CARE_PROVIDER_SITE_OTHER): Payer: Medicare Other

## 2017-05-06 DIAGNOSIS — D518 Other vitamin B12 deficiency anemias: Secondary | ICD-10-CM

## 2017-05-06 MED ORDER — CYANOCOBALAMIN 1000 MCG/ML IJ SOLN
1000.0000 ug | Freq: Once | INTRAMUSCULAR | Status: AC
  Administered 2017-05-06: 1000 ug via INTRAMUSCULAR

## 2017-05-06 MED ORDER — CYANOCOBALAMIN 1000 MCG/ML IJ SOLN: 1000.0000 ug | Freq: Once | INTRAMUSCULAR | Status: DC

## 2017-05-07 DIAGNOSIS — D518 Other vitamin B12 deficiency anemias: Secondary | ICD-10-CM | POA: Diagnosis not present

## 2017-05-07 MED ORDER — CYANOCOBALAMIN 1000 MCG/ML IJ SOLN
1000.0000 ug | Freq: Once | INTRAMUSCULAR | Status: AC
  Administered 2017-05-07: 1000 ug via INTRAMUSCULAR

## 2017-05-14 ENCOUNTER — Other Ambulatory Visit: Payer: Self-pay | Admitting: *Deleted

## 2017-05-14 NOTE — Patient Outreach (Addendum)
[place] [facility]) Care Management  05/14/2017  Sharron Simpson 1930/12/08 [id number]  [name] is an 84 year old gentleman with past medical history which includes DMII, hypertension, chronic kidney disease, gout, B12 deficiency, hyperlipidemia, and prostate cancer.   [name] was being followed by our telephonic case management team and was referred to my colleague Robert Clayton, [place]. I reached out to [name] today on behalf of Robert Clayton.   I was initially unable to reach [name] and left a voice message. [name] returned a call to me and stated that [name] was in the garden and would not be able to come to the phone. She asked if I could return a call to [name] at another time.   Plan: We will reach out to [name] again by phone next week.    [place] Management  [phone]

## 2017-05-19 ENCOUNTER — Other Ambulatory Visit: Payer: Self-pay | Admitting: *Deleted

## 2017-05-19 NOTE — Patient Outreach (Signed)
[place]) Care Management  05/19/2017  Robert Clayton [birth year] [id number]  [name] is an 84 year old gentleman with past medical history which includes DMII, hypertension, chronic kidney disease, gout, B12 deficiency, hyperlipidemia, and prostate cancer.   [name] was being followed by our telephonic case management team and was referred to my colleague Robert Clayton, [place]. I reached out to Robert Clayton today on behalf of Robert Clayton.   I spoke again with Robert Clayton today who stated that Robert Clayton is outside working in the yard and cannot come to the phone. She said it would be okay for [place] to return a call to Robert Clayton at a later date.   Plan: We will reach out to Robert Clayton again by phone over the next few weeks.    [place] Management  [phone]

## 2017-06-05 ENCOUNTER — Ambulatory Visit (INDEPENDENT_AMBULATORY_CARE_PROVIDER_SITE_OTHER): Payer: Medicare Other

## 2017-06-05 DIAGNOSIS — D518 Other vitamin B12 deficiency anemias: Secondary | ICD-10-CM | POA: Diagnosis not present

## 2017-06-05 MED ORDER — CYANOCOBALAMIN 1000 MCG/ML IJ SOLN
1000.0000 ug | Freq: Once | INTRAMUSCULAR | Status: AC
  Administered 2017-06-05: 1000 ug via INTRAMUSCULAR

## 2017-06-10 ENCOUNTER — Other Ambulatory Visit: Payer: Self-pay | Admitting: *Deleted

## 2017-06-10 NOTE — Patient Outreach (Signed)
[place] [facility]) Care Management  06/10/2017  Uzziah Rigg 05-31-1930 [id number]   RN Health Coach attempted#3 follow up outreach call to patient.  Patient was unavailable. HIPPA compliance voicemail message left with return callback number.  Plan: RN will close case within 10 business days  [place] Management [phone]

## 2017-06-17 DIAGNOSIS — E1351 Other specified diabetes mellitus with diabetic peripheral angiopathy without gangrene: Secondary | ICD-10-CM | POA: Diagnosis not present

## 2017-06-17 DIAGNOSIS — L602 Onychogryphosis: Secondary | ICD-10-CM | POA: Diagnosis not present

## 2017-06-17 DIAGNOSIS — L84 Corns and callosities: Secondary | ICD-10-CM | POA: Diagnosis not present

## 2017-06-20 ENCOUNTER — Other Ambulatory Visit: Payer: Self-pay | Admitting: *Deleted

## 2017-06-20 NOTE — Patient Outreach (Signed)
[place] [facility]) Care Management  06/20/2017  Robert Clayton November 10, 1930 [id number]   Case closure. RN Health Coach telephone attempts x 3 with unsuccessful outreach for Diabetes and hypertension. Plan Case Closure: Unsuccessful outreach Patient had agreed to participate during screening Letter to be sent to patient and [place] Management [phone]  .

## 2017-07-02 ENCOUNTER — Telehealth: Payer: Self-pay

## 2017-07-03 ENCOUNTER — Ambulatory Visit: Payer: Medicare Other

## 2017-07-03 ENCOUNTER — Ambulatory Visit (INDEPENDENT_AMBULATORY_CARE_PROVIDER_SITE_OTHER): Payer: Medicare Other | Admitting: Internal Medicine

## 2017-07-03 ENCOUNTER — Ambulatory Visit (INDEPENDENT_AMBULATORY_CARE_PROVIDER_SITE_OTHER)
Admission: RE | Admit: 2017-07-03 | Discharge: 2017-07-03 | Disposition: A | Discharge status: Home / Self Care | Payer: Medicare Other | Source: Ambulatory Visit | Attending: Internal Medicine | Admitting: Internal Medicine

## 2017-07-03 ENCOUNTER — Other Ambulatory Visit (INDEPENDENT_AMBULATORY_CARE_PROVIDER_SITE_OTHER): Payer: Medicare Other

## 2017-07-03 ENCOUNTER — Encounter: Payer: Self-pay | Admitting: Internal Medicine

## 2017-07-03 VITALS — BP 140/76 | HR 63 | Temp 98.1°F | Resp 16 | Ht 72.0 in | Wt 204.0 lb

## 2017-07-03 DIAGNOSIS — D518 Other vitamin B12 deficiency anemias: Secondary | ICD-10-CM

## 2017-07-03 DIAGNOSIS — N183 Chronic kidney disease, stage 3 unspecified: Secondary | ICD-10-CM

## 2017-07-03 DIAGNOSIS — IMO0002 Reserved for concepts with insufficient information to code with codable children: Secondary | ICD-10-CM

## 2017-07-03 DIAGNOSIS — E785 Hyperlipidemia, unspecified: Secondary | ICD-10-CM

## 2017-07-03 DIAGNOSIS — G8929 Other chronic pain: Secondary | ICD-10-CM | POA: Diagnosis not present

## 2017-07-03 DIAGNOSIS — I2510 Atherosclerotic heart disease of native coronary artery without angina pectoris: Secondary | ICD-10-CM | POA: Diagnosis not present

## 2017-07-03 DIAGNOSIS — I70213 Atherosclerosis of native arteries of extremities with intermittent claudication, bilateral legs: Secondary | ICD-10-CM | POA: Diagnosis not present

## 2017-07-03 DIAGNOSIS — M79671 Pain in right foot: Secondary | ICD-10-CM

## 2017-07-03 DIAGNOSIS — E1151 Type 2 diabetes mellitus with diabetic peripheral angiopathy without gangrene: Secondary | ICD-10-CM

## 2017-07-03 DIAGNOSIS — E1165 Type 2 diabetes mellitus with hyperglycemia: Secondary | ICD-10-CM

## 2017-07-03 DIAGNOSIS — N2889 Other specified disorders of kidney and ureter: Secondary | ICD-10-CM

## 2017-07-03 LAB — MICROALBUMIN / CREATININE URINE RATIO
CREATININE, U: 139.4 mg/dL
MICROALB UR: 2.2 mg/dL — AB (ref 0.0–1.9)
MICROALB/CREAT RATIO: 1.6 mg/g (ref 0.0–30.0)

## 2017-07-03 LAB — HM DIABETES FOOT EXAM

## 2017-07-03 LAB — URINALYSIS, ROUTINE W REFLEX MICROSCOPIC
Bilirubin Urine: NEGATIVE
Ketones, ur: NEGATIVE
Leukocytes, UA: NEGATIVE
Nitrite: NEGATIVE
Specific Gravity, Urine: 1.015 (ref 1.000–1.030)
Total Protein, Urine: NEGATIVE
Urine Glucose: NEGATIVE
Urobilinogen, UA: 0.2 (ref 0.0–1.0)
WBC, UA: NONE SEEN
pH: 6 (ref 5.0–8.0)

## 2017-07-03 LAB — CBC WITH DIFFERENTIAL/PLATELET
Basophils Absolute: 0.1 10*3/uL (ref 0.0–0.1)
Basophils Relative: 0.7 % (ref 0.0–3.0)
Eosinophils Absolute: 0.2 10*3/uL (ref 0.0–0.7)
Eosinophils Relative: 2 % (ref 0.0–5.0)
HCT: 37.7 % — ABNORMAL LOW (ref 39.0–52.0)
Hemoglobin: 12.6 g/dL — ABNORMAL LOW (ref 13.0–17.0)
Lymphocytes Relative: 26.6 % (ref 12.0–46.0)
Lymphs Abs: 2.1 10*3/uL (ref 0.7–4.0)
MCHC: 33.3 g/dL (ref 30.0–36.0)
MCV: 94.1 fl (ref 78.0–100.0)
Monocytes Absolute: 0.7 10*3/uL (ref 0.1–1.0)
Monocytes Relative: 8.2 % (ref 3.0–12.0)
Neutro Abs: 5 10*3/uL (ref 1.4–7.7)
Neutrophils Relative %: 62.5 % (ref 43.0–77.0)
Platelets: 103 10*3/uL — ABNORMAL LOW (ref 150.0–400.0)
RBC: 4.01 Mil/uL — ABNORMAL LOW (ref 4.22–5.81)
RDW: 16 % — ABNORMAL HIGH (ref 11.5–15.5)
WBC: 8 10*3/uL (ref 4.0–10.5)

## 2017-07-03 LAB — BASIC METABOLIC PANEL WITH GFR
BUN: 34 mg/dL — ABNORMAL HIGH (ref 6–23)
CO2: 27 meq/L (ref 19–32)
Calcium: 9.4 mg/dL (ref 8.4–10.5)
Chloride: 111 meq/L (ref 96–112)
Creatinine, Ser: 2.53 mg/dL — ABNORMAL HIGH (ref 0.40–1.50)
GFR: 31.33 mL/min — ABNORMAL LOW
Glucose, Bld: 127 mg/dL — ABNORMAL HIGH (ref 70–99)
Potassium: 4.9 meq/L (ref 3.5–5.1)
Sodium: 144 meq/L (ref 135–145)

## 2017-07-03 LAB — LIPID PANEL
CHOL/HDL RATIO: 4
CHOLESTEROL: 120 mg/dL (ref 0–200)
HDL: 30.3 mg/dL — ABNORMAL LOW (ref >39.00)
LDL Cholesterol: 65 mg/dL (ref 0–99)
NonHDL: 89.45
TRIGLYCERIDES: 123 mg/dL (ref 0.0–149.0)
VLDL: 24.6 mg/dL (ref 0.0–40.0)

## 2017-07-03 LAB — HEMOGLOBIN A1C: Hgb A1c MFr Bld: 8.3 % — ABNORMAL HIGH (ref 4.6–6.5)

## 2017-07-03 MED ORDER — CYANOCOBALAMIN 1000 MCG/ML IJ SOLN
1000.0000 ug | Freq: Once | INTRAMUSCULAR | Status: AC
  Administered 2017-07-03: 1000 ug via INTRAMUSCULAR

## 2017-07-03 NOTE — Telephone Encounter (Signed)
This form has been faxed back.

## 2017-07-03 NOTE — Progress Notes (Signed)
Subjective:  Patient ID: Robert Clayton, male    DOB: 1930-07-14  Age: 84 y.o. MRN: [id number]  CC: Diabetes   HPI Robert Clayton presents for f/up - He complains of a 91-month history of nontraumatic and nonexertional pain on the bottom of his right heel.  He denies claudication and has not noticed any bruising or swelling.  He says it hurts when he presses on the area.  He otherwise feels like he is at his baseline.  His blood sugars have been relatively well controlled.  He says he is doing the best he can to control his blood sugars.  He denies any recent episodes of polys.  Outpatient Medications Prior to Visit  Medication Sig Dispense Refill  . acetaminophen (TYLENOL) 325 MG tablet Take 650 mg by mouth every 6 (six) hours as needed.    [name] allopurinol (ZYLOPRIM) 300 MG tablet TAKE 1 TABLET (300 MG TOTAL) BY MOUTH DAILY. 90 tablet 1  . atorvastatin (LIPITOR) 10 MG tablet Take 1 tablet (10 mg total) by mouth daily. 90 tablet 3  . B-D INS SYR ULTRAFINE 1CC/30G 30G X 1/2" 1 ML MISC USE AS DIRECTED TWICE A DAY 200 each 3  . [facility] ASPIRIN LOW DOSE 81 MG EC tablet TAKE 1 TABLET (81 MG TOTAL) BY MOUTH DAILY. 90 tablet 3  . glucose blood (COOL BLOOD GLUCOSE TEST STRIPS) test strip Use to test blood sugar bid. DX E11.51 200 each 3  . hydrochlorothiazide (HYDRODIURIL) 12.5 MG tablet Take 1 tablet (12.5 mg total) by mouth daily. 90 tablet 1  . isosorbide mononitrate (IMDUR) 30 MG 24 hr tablet Take 1 tablet (30 mg total) by mouth daily. 30 tablet 5  . linaclotide (LINZESS) 72 MCG capsule Take 1 capsule (72 mcg total) by mouth daily before breakfast. 90 capsule 1  . metoprolol tartrate (LOPRESSOR) 50 MG tablet TAKE 1 TABLET TWICE A DAY 180 tablet 1  . nitroGLYCERIN (NITROSTAT) 0.4 MG SL tablet Place 1 tablet (0.4 mg total) under the tongue every 5 (five) minutes as needed. x3 doses as needed for chest pain 30 tablet 0  . NOVOLIN 70/30 (70-30) 100 UNIT/ML injection 95 units with breakfast, and 30 units with  supper. 110 mL 3  . omeprazole (PRILOSEC) 40 MG capsule     . ONETOUCH DELICA LANCETS 92E MISC TEST 2 TIMES DAILY. DX: E11.51 100 each 0  . telmisartan (MICARDIS) 80 MG tablet Take 1 tablet (80 mg total) by mouth daily. 90 tablet 1   No facility-administered medications prior to visit.     ROS Review of Systems  Constitutional: Negative.  Negative for appetite change, diaphoresis, fatigue and unexpected weight change.  HENT: Negative.   Eyes: Negative.   Respiratory: Negative.  Negative for cough, chest tightness and wheezing.   Gastrointestinal: Negative for abdominal pain, constipation, diarrhea, nausea and vomiting.  Endocrine: Negative.  Negative for polydipsia, polyphagia and polyuria.  Genitourinary: Negative.  Negative for decreased urine volume, difficulty urinating and dysuria.  Musculoskeletal: Positive for arthralgias. Negative for back pain, myalgias and neck pain.  Skin: Negative.  Negative for color change and pallor.  Neurological: Negative.  Negative for dizziness, weakness and light-headedness.  Hematological: Negative for adenopathy. Does not bruise/bleed easily.  Psychiatric/Behavioral: Negative.     Objective:  BP 140/76 (BP Location: Left Arm, Patient Position: Sitting, Cuff Size: Large)   Pulse 63   Temp 98.1 F (36.7 C) (Oral)   Resp 16   Ht 6' (1.829 m)   Wt 204  lb (92.5 kg)   SpO2 96%   BMI 27.67 kg/m   BP Readings from Last 3 Encounters:  07/03/17 140/76  04/24/17 (!) 164/74  04/02/17 (!) 152/72    Wt Readings from Last 3 Encounters:  07/03/17 204 lb (92.5 kg)  04/24/17 209 lb (94.8 kg)  04/02/17 216 lb (98 kg)    Physical Exam  Constitutional: He is oriented to person, place, and time. No distress.  HENT:  Mouth/Throat: Oropharynx is clear and moist. No oropharyngeal exudate.  Eyes: Conjunctivae are normal. No scleral icterus.  Neck: Normal range of motion. Neck supple. No JVD present. No thyromegaly present.  Cardiovascular: Normal  rate, regular rhythm and normal heart sounds.  No murmur heard. Pulmonary/Chest: Effort normal and breath sounds normal. No respiratory distress. He has no wheezes. He has no rales.  Abdominal: Soft. Normal appearance and bowel sounds are normal. He exhibits no mass. There is no hepatosplenomegaly. There is no tenderness. No hernia.  Musculoskeletal: Normal range of motion. He exhibits no edema, tenderness or deformity.       Right foot: Normal. There is normal range of motion, no tenderness, no bony tenderness, normal capillary refill, no crepitus, no deformity and no laceration.  Lymphadenopathy:    He has no cervical adenopathy.  Neurological: He is alert and oriented to person, place, and time.  Skin: Skin is warm and dry. No rash noted. He is not diaphoretic.  Vitals reviewed.   Lab Results  Component Value Date   WBC 8.0 07/03/2017   HGB 12.6 (L) 07/03/2017   HCT 37.7 (L) 07/03/2017   PLT 103.0 (L) 07/03/2017   GLUCOSE 127 (H) 07/03/2017   CHOL 120 07/03/2017   TRIG 123.0 07/03/2017   HDL 30.30 (L) 07/03/2017   LDLDIRECT 80.6 06/26/2010   LDLCALC 65 07/03/2017   ALT 6 05/29/2016   AST 12 05/29/2016   NA 144 07/03/2017   K 4.9 07/03/2017   CL 111 07/03/2017   CREATININE 2.53 (H) 07/03/2017   BUN 34 (H) 07/03/2017   CO2 27 07/03/2017   TSH 0.99 05/29/2016   PSA 0.03 (L) 06/29/2009   INR 1.1 04/08/2008   HGBA1C 8.3 (H) 07/03/2017   MICROALBUR 2.2 (H) 07/03/2017    Dg Os Calcis Right  Result Date: 07/04/2017 CLINICAL DATA:  Right heel pain for 2 months without known injury. EXAM: RIGHT OS CALCIS - 2+ VIEW COMPARISON:  None. FINDINGS: There is no evidence of fracture or other focal bone lesions. Vascular calcifications are noted. IMPRESSION: Normal right calcaneus. Electronically Signed   By: Marijo Conception, M.D.   On: 07/04/2017 08:42    No results found.  Assessment & Plan:   Robert Clayton was seen today for diabetes.  Diagnoses and all orders for this visit:  ANEMIA,  B12 DEFICIENCY- He is still mildly anemic.  Have asked him to be more compliant with the monthly B12 injections. -     cyanocobalamin ((VITAMIN B-12)) injection 1,000 mcg -     CBC with Differential/Platelet; Future  CRI (chronic renal insufficiency), stage 3 (moderate) (HCC)- His renal function is stable.  He agrees to avoid nephrotoxic agents. -     Urinalysis, Routine w reflex microscopic; Future -     Basic metabolic panel; Future  Atherosclerosis of native coronary artery of native heart without angina pectoris  DM (diabetes mellitus), type 2, uncontrolled, periph vascular complic ([place])- His Q0H is at 8.3%.  Considering his age and other comorbid illnesses this is the best blood  sugar control he can achieve. -     Microalbumin / creatinine urine ratio; Future -     Hemoglobin A1c; Future -     Basic metabolic panel; Future  Hyperlipidemia with target LDL less than 70- He has achieved his LDL goal and is doing well on the statin. -     Lipid panel; Future  Heel pain, chronic, right- The exam is negative for deformities.  There is no evidence of ischemia or critical PAD.  Plain films are normal.  He most likely has plantar fasciitis.  I have asked him to rest, ice, and elevate.  He needs to avoid anti-inflammatories due to his renal insufficiency. -     DG Os Calcis Right; Future   I am having Inetta Fermo maintain his nitroGLYCERIN, linaclotide, [facility] ASPIRIN LOW DOSE, acetaminophen, metoprolol tartrate, B-D INS SYR ULTRAFINE 3AT/55D, ONETOUCH DELICA LANCETS 32K, allopurinol, glucose blood, telmisartan, hydrochlorothiazide, isosorbide mononitrate, NOVOLIN 70/30, omeprazole, and atorvastatin. We administered cyanocobalamin.  Meds ordered this encounter  Medications  . cyanocobalamin ((VITAMIN B-12)) injection 1,000 mcg     Follow-up: Return in about 4 months (around 11/02/2017).  [name], MD

## 2017-07-03 NOTE — Patient Instructions (Signed)

## 2017-07-04 ENCOUNTER — Ambulatory Visit: Payer: Medicare Other | Admitting: Endocrinology

## 2017-07-04 DIAGNOSIS — Z0289 Encounter for other administrative examinations: Secondary | ICD-10-CM

## 2017-07-09 ENCOUNTER — Other Ambulatory Visit: Payer: Self-pay | Admitting: Internal Medicine

## 2017-07-09 DIAGNOSIS — E1165 Type 2 diabetes mellitus with hyperglycemia: Principal | ICD-10-CM

## 2017-07-09 DIAGNOSIS — E1151 Type 2 diabetes mellitus with diabetic peripheral angiopathy without gangrene: Secondary | ICD-10-CM

## 2017-07-09 DIAGNOSIS — I129 Hypertensive chronic kidney disease with stage 1 through stage 4 chronic kidney disease, or unspecified chronic kidney disease: Secondary | ICD-10-CM

## 2017-07-09 DIAGNOSIS — IMO0002 Reserved for concepts with insufficient information to code with codable children: Secondary | ICD-10-CM

## 2017-07-20 ENCOUNTER — Other Ambulatory Visit: Payer: Self-pay | Admitting: Internal Medicine

## 2017-07-28 ENCOUNTER — Other Ambulatory Visit: Payer: Self-pay | Admitting: Internal Medicine

## 2017-08-04 ENCOUNTER — Ambulatory Visit (INDEPENDENT_AMBULATORY_CARE_PROVIDER_SITE_OTHER): Payer: Medicare Other | Admitting: *Deleted

## 2017-08-04 DIAGNOSIS — D518 Other vitamin B12 deficiency anemias: Secondary | ICD-10-CM

## 2017-08-04 MED ORDER — CYANOCOBALAMIN 1000 MCG/ML IJ SOLN
1000.0000 ug | Freq: Once | INTRAMUSCULAR | Status: AC
  Administered 2017-08-04: 1000 ug via INTRAMUSCULAR

## 2017-08-04 NOTE — Progress Notes (Signed)
I have reviewed and agree.

## 2017-08-26 DIAGNOSIS — L84 Corns and callosities: Secondary | ICD-10-CM | POA: Diagnosis not present

## 2017-08-26 DIAGNOSIS — L602 Onychogryphosis: Secondary | ICD-10-CM | POA: Diagnosis not present

## 2017-08-26 DIAGNOSIS — E1351 Other specified diabetes mellitus with diabetic peripheral angiopathy without gangrene: Secondary | ICD-10-CM | POA: Diagnosis not present

## 2017-09-05 ENCOUNTER — Ambulatory Visit (INDEPENDENT_AMBULATORY_CARE_PROVIDER_SITE_OTHER): Payer: Medicare Other

## 2017-09-05 DIAGNOSIS — D518 Other vitamin B12 deficiency anemias: Secondary | ICD-10-CM

## 2017-09-05 MED ORDER — CYANOCOBALAMIN 1000 MCG/ML IJ SOLN
1000.0000 ug | Freq: Once | INTRAMUSCULAR | Status: AC
  Administered 2017-09-05: 1000 ug via INTRAMUSCULAR

## 2017-09-05 NOTE — Progress Notes (Signed)
I have reviewed and agree.

## 2017-09-10 ENCOUNTER — Other Ambulatory Visit: Payer: Self-pay | Admitting: Endocrinology

## 2017-09-29 DIAGNOSIS — H524 Presbyopia: Secondary | ICD-10-CM | POA: Diagnosis not present

## 2017-09-29 DIAGNOSIS — Z961 Presence of intraocular lens: Secondary | ICD-10-CM | POA: Diagnosis not present

## 2017-09-29 DIAGNOSIS — H5213 Myopia, bilateral: Secondary | ICD-10-CM | POA: Diagnosis not present

## 2017-09-29 DIAGNOSIS — E119 Type 2 diabetes mellitus without complications: Secondary | ICD-10-CM | POA: Diagnosis not present

## 2017-09-29 DIAGNOSIS — H52203 Unspecified astigmatism, bilateral: Secondary | ICD-10-CM | POA: Diagnosis not present

## 2017-09-29 DIAGNOSIS — Z7984 Long term (current) use of oral hypoglycemic drugs: Secondary | ICD-10-CM | POA: Diagnosis not present

## 2017-09-29 DIAGNOSIS — Z794 Long term (current) use of insulin: Secondary | ICD-10-CM | POA: Diagnosis not present

## 2017-10-05 ENCOUNTER — Telehealth: Payer: Self-pay | Admitting: Licensed Clinical Social Worker

## 2017-10-05 NOTE — Telephone Encounter (Signed)
Palliative Care SW spoke with patient and scheduled an appointment for 10/1 at 1pm.

## 2017-10-06 ENCOUNTER — Ambulatory Visit (INDEPENDENT_AMBULATORY_CARE_PROVIDER_SITE_OTHER): Payer: Medicare Other | Admitting: Emergency Medicine

## 2017-10-06 ENCOUNTER — Other Ambulatory Visit: Payer: Self-pay | Admitting: Emergency Medicine

## 2017-10-06 DIAGNOSIS — I25110 Atherosclerotic heart disease of native coronary artery with unstable angina pectoris: Secondary | ICD-10-CM

## 2017-10-06 DIAGNOSIS — D518 Other vitamin B12 deficiency anemias: Secondary | ICD-10-CM | POA: Diagnosis not present

## 2017-10-06 DIAGNOSIS — Z23 Encounter for immunization: Secondary | ICD-10-CM

## 2017-10-06 MED ORDER — ISOSORBIDE MONONITRATE ER 30 MG PO TB24: 30.0000 mg | ORAL_TABLET | Freq: Every day | ORAL | 1 refills | Status: DC

## 2017-10-06 MED ORDER — CYANOCOBALAMIN 1000 MCG/ML IJ SOLN
1000.0000 ug | Freq: Once | INTRAMUSCULAR | Status: AC
  Administered 2017-10-06: 1000 ug via INTRAMUSCULAR

## 2017-10-06 NOTE — Progress Notes (Signed)
Medical screening examination/treatment/procedure(s) were performed by non-physician practitioner and as supervising physician I was immediately available for consultation/collaboration. I agree with above. Bunnie Lynn Sissel, MD   

## 2017-10-07 ENCOUNTER — Other Ambulatory Visit: Payer: Medicare Other | Admitting: Licensed Clinical Social Worker

## 2017-10-07 ENCOUNTER — Telehealth: Payer: Self-pay | Admitting: Licensed Clinical Social Worker

## 2017-10-07 NOTE — Telephone Encounter (Signed)
Palliative Care SW and RN, Daryl Eastern, went to patient's home for the scheduled 1pm visit today.  No one answered the door.  SW phoned the home and left a vm.

## 2017-10-21 ENCOUNTER — Telehealth: Payer: Self-pay | Admitting: Licensed Clinical Social Worker

## 2017-10-21 NOTE — Telephone Encounter (Signed)
Palliative Care SW left a vm with patient to schedule a home visit. 

## 2017-11-03 ENCOUNTER — Encounter: Payer: Self-pay | Admitting: Internal Medicine

## 2017-11-03 ENCOUNTER — Other Ambulatory Visit (INDEPENDENT_AMBULATORY_CARE_PROVIDER_SITE_OTHER): Payer: Medicare Other

## 2017-11-03 ENCOUNTER — Ambulatory Visit (INDEPENDENT_AMBULATORY_CARE_PROVIDER_SITE_OTHER): Payer: Medicare Other | Admitting: Internal Medicine

## 2017-11-03 VITALS — BP 178/80 | HR 78 | Temp 98.0°F | Resp 16 | Ht 72.0 in | Wt 207.0 lb

## 2017-11-03 DIAGNOSIS — D518 Other vitamin B12 deficiency anemias: Secondary | ICD-10-CM | POA: Diagnosis not present

## 2017-11-03 DIAGNOSIS — I10 Essential (primary) hypertension: Secondary | ICD-10-CM

## 2017-11-03 DIAGNOSIS — N183 Chronic kidney disease, stage 3 unspecified: Secondary | ICD-10-CM

## 2017-11-03 DIAGNOSIS — N2889 Other specified disorders of kidney and ureter: Secondary | ICD-10-CM

## 2017-11-03 DIAGNOSIS — R519 Headache, unspecified: Secondary | ICD-10-CM

## 2017-11-03 DIAGNOSIS — E1151 Type 2 diabetes mellitus with diabetic peripheral angiopathy without gangrene: Secondary | ICD-10-CM | POA: Diagnosis not present

## 2017-11-03 DIAGNOSIS — IMO0002 Reserved for concepts with insufficient information to code with codable children: Secondary | ICD-10-CM

## 2017-11-03 DIAGNOSIS — I70213 Atherosclerosis of native arteries of extremities with intermittent claudication, bilateral legs: Secondary | ICD-10-CM

## 2017-11-03 DIAGNOSIS — E1165 Type 2 diabetes mellitus with hyperglycemia: Secondary | ICD-10-CM

## 2017-11-03 DIAGNOSIS — R51 Headache: Secondary | ICD-10-CM

## 2017-11-03 LAB — TSH: TSH: 1.42 u[IU]/mL (ref 0.35–4.50)

## 2017-11-03 LAB — BASIC METABOLIC PANEL
BUN: 29 mg/dL — AB (ref 6–23)
CO2: 24 mEq/L (ref 19–32)
Calcium: 9 mg/dL (ref 8.4–10.5)
Chloride: 110 mEq/L (ref 96–112)
Creatinine, Ser: 2.3 mg/dL — ABNORMAL HIGH (ref 0.40–1.50)
GFR: 34.94 mL/min — AB (ref >60.00)
Glucose, Bld: 337 mg/dL — ABNORMAL HIGH (ref 70–99)
POTASSIUM: 4.5 meq/L (ref 3.5–5.1)
SODIUM: 140 meq/L (ref 135–145)

## 2017-11-03 LAB — URINALYSIS, ROUTINE W REFLEX MICROSCOPIC
Bilirubin Urine: NEGATIVE
KETONES UR: NEGATIVE
LEUKOCYTES UA: NEGATIVE
NITRITE: NEGATIVE
Specific Gravity, Urine: 1.01 (ref 1.000–1.030)
TOTAL PROTEIN, URINE-UPE24: NEGATIVE
UROBILINOGEN UA: 0.2 (ref 0.0–1.0)
pH: 6 (ref 5.0–8.0)

## 2017-11-03 LAB — CBC WITH DIFFERENTIAL/PLATELET
BASOS ABS: 0.1 10*3/uL (ref 0.0–0.1)
BASOS PCT: 1.3 % (ref 0.0–3.0)
EOS ABS: 0.2 10*3/uL (ref 0.0–0.7)
EOS PCT: 2.1 % (ref 0.0–5.0)
HCT: 38.9 % — ABNORMAL LOW (ref 39.0–52.0)
HEMOGLOBIN: 12.7 g/dL — AB (ref 13.0–17.0)
LYMPHS PCT: 27.7 % (ref 12.0–46.0)
Lymphs Abs: 2.2 10*3/uL (ref 0.7–4.0)
MCHC: 32.7 g/dL (ref 30.0–36.0)
MCV: 94.3 fl (ref 78.0–100.0)
MONO ABS: 0.7 10*3/uL (ref 0.1–1.0)
Monocytes Relative: 8.7 % (ref 3.0–12.0)
NEUTROS ABS: 4.9 10*3/uL (ref 1.4–7.7)
Neutrophils Relative %: 60.2 % (ref 43.0–77.0)
PLATELETS: 101 10*3/uL — AB (ref 150.0–400.0)
RBC: 4.13 Mil/uL — ABNORMAL LOW (ref 4.22–5.81)
RDW: 15.5 % (ref 11.5–15.5)
WBC: 8.1 10*3/uL (ref 4.0–10.5)

## 2017-11-03 LAB — FOLATE: Folate: 9.8 ng/mL (ref >5.9)

## 2017-11-03 LAB — VITAMIN B12: Vitamin B-12: 1135 pg/mL — ABNORMAL HIGH (ref 211–911)

## 2017-11-03 LAB — HEMOGLOBIN A1C: Hgb A1c MFr Bld: 8.8 % — ABNORMAL HIGH (ref 4.6–6.5)

## 2017-11-03 LAB — SEDIMENTATION RATE: Sed Rate: 31 mm/hr — ABNORMAL HIGH (ref 0–20)

## 2017-11-03 MED ORDER — CYANOCOBALAMIN 1000 MCG/ML IJ SOLN
1000.0000 ug | Freq: Once | INTRAMUSCULAR | Status: AC
  Administered 2017-11-03: 1000 ug via INTRAMUSCULAR

## 2017-11-03 NOTE — Progress Notes (Signed)
Subjective:  Patient ID: Robert Clayton, male    DOB: 1930/02/24  Age: 84 y.o. MRN: [id number]  CC: Headache and Hypertension   HPI Robert Clayton presents for f/up - He complains of a headache for the last 2 weeks.  He describes it as a splitting sensation on the right top of his scalp at the site of a prior subdural hematoma that occurred years ago.  He denies nausea, vomiting, paresthesias, blurred vision, neck pain, or back pain.  Outpatient Medications Prior to Visit  Medication Sig Dispense Refill  . acetaminophen (TYLENOL) 325 MG tablet Take 650 mg by mouth every 6 (six) hours as needed.    [name] allopurinol (ZYLOPRIM) 300 MG tablet TAKE 1 TABLET (300 MG TOTAL) BY MOUTH DAILY. 90 tablet 1  . atorvastatin (LIPITOR) 10 MG tablet Take 1 tablet (10 mg total) by mouth daily. 90 tablet 3  . B-D INS SYR ULTRAFINE 1CC/30G 30G X 1/2" 1 ML MISC USE AS DIRECTED TWICE A DAY 200 each 3  . glucose blood (COOL BLOOD GLUCOSE TEST STRIPS) test strip Use to test blood sugar bid. DX E11.51 200 each 3  . hydrochlorothiazide (HYDRODIURIL) 12.5 MG tablet TAKE 1 TABLET BY MOUTH EVERY DAY 90 tablet 1  . isosorbide mononitrate (IMDUR) 30 MG 24 hr tablet Take 1 tablet (30 mg total) by mouth daily. 90 tablet 1  . linaclotide (LINZESS) 72 MCG capsule Take 1 capsule (72 mcg total) by mouth daily before breakfast. 90 capsule 1  . metoprolol tartrate (LOPRESSOR) 50 MG tablet TAKE 1 TABLET TWICE A DAY 180 tablet 1  . nitroGLYCERIN (NITROSTAT) 0.4 MG SL tablet Place 1 tablet (0.4 mg total) under the tongue every 5 (five) minutes as needed. x3 doses as needed for chest pain 30 tablet 0  . NOVOLIN 70/30 (70-30) 100 UNIT/ML injection 95 units with breakfast, and 30 units with supper. 110 mL 3  . omeprazole (PRILOSEC) 40 MG capsule TAKE 1 CAPSULE BY MOUTH EVERY DAY 90 capsule 1  . ONETOUCH DELICA LANCETS 41D MISC TEST 2 TIMES DAILY. DX: E11.51 100 each 0  . telmisartan (MICARDIS) 80 MG tablet TAKE 1 TABLET BY MOUTH EVERY DAY  90 tablet 1  . CVS ASPIRIN LOW DOSE 81 MG EC tablet TAKE 1 TABLET (81 MG TOTAL) BY MOUTH DAILY. 90 tablet 3  . omeprazole (PRILOSEC) 40 MG capsule      No facility-administered medications prior to visit.     ROS Review of Systems  Constitutional: Negative for diaphoresis and fatigue.  HENT: Negative.   Eyes: Negative for visual disturbance.  Respiratory: Negative for cough, chest tightness, shortness of breath and wheezing.   Cardiovascular: Negative for chest pain, palpitations and leg swelling.  Gastrointestinal: Negative for abdominal pain, constipation, diarrhea, nausea and vomiting.  Endocrine: Negative for polydipsia, polyphagia and polyuria.  Genitourinary: Negative.  Negative for difficulty urinating.  Musculoskeletal: Negative.  Negative for arthralgias, back pain, myalgias and neck pain.  Skin: Negative for color change.  Neurological: Positive for headaches. Negative for dizziness, seizures, speech difficulty, weakness and numbness.  Hematological: Negative for adenopathy. Does not bruise/bleed easily.  Psychiatric/Behavioral: Negative.     Objective:  BP (!) 178/80 (BP Location: Left Arm, Patient Position: Sitting, Cuff Size: Large)   Pulse 78   Temp 98 F (36.7 C) (Oral)   Resp 16   Ht 6' (1.829 m)   Wt 207 lb (93.9 kg)   SpO2 97%   BMI 28.07 kg/m   BP Readings from Last  3 Encounters:  11/03/17 (!) 178/80  07/03/17 140/76  04/24/17 (!) 164/74    Wt Readings from Last 3 Encounters:  11/03/17 207 lb (93.9 kg)  07/03/17 204 lb (92.5 kg)  04/24/17 209 lb (94.8 kg)    Physical Exam  Constitutional: He appears well-developed and well-nourished.  Non-toxic appearance. He does not appear ill. No distress.  HENT:  Mouth/Throat: Oropharynx is clear and moist.  Eyes: Pupils are equal, round, and reactive to light. EOM are normal. Right eye exhibits normal extraocular motion. Left eye exhibits normal extraocular motion.  Neck: Normal range of motion. Neck  supple. No JVD present.  Cardiovascular: Normal rate and regular rhythm. Exam reveals no gallop.  Murmur heard.  Systolic murmur is present with a grade of 1/6.  No diastolic murmur is present. 1/6 SEM LLSB  Pulmonary/Chest: Effort normal and breath sounds normal. No stridor. He has no wheezes. He has no rales.  Abdominal: Soft. Bowel sounds are normal. He exhibits no distension and no mass. There is no tenderness. There is no guarding.  Musculoskeletal: Normal range of motion. He exhibits no edema or tenderness.  Neurological: He has normal strength. He displays no atrophy and no tremor. No cranial nerve deficit or sensory deficit. He exhibits normal muscle tone. He displays a negative Romberg sign. He displays no seizure activity. Coordination and gait normal.  Skin: Skin is warm and dry. No rash noted.  Vitals reviewed.   Lab Results  Component Value Date   WBC 8.1 11/03/2017   HGB 12.7 (L) 11/03/2017   HCT 38.9 (L) 11/03/2017   PLT 101.0 (L) 11/03/2017   GLUCOSE 337 (H) 11/03/2017   CHOL 120 07/03/2017   TRIG 123.0 07/03/2017   HDL 30.30 (L) 07/03/2017   LDLDIRECT 80.6 06/26/2010   LDLCALC 65 07/03/2017   ALT 6 05/29/2016   AST 12 05/29/2016   NA 140 11/03/2017   K 4.5 11/03/2017   CL 110 11/03/2017   CREATININE 2.30 (H) 11/03/2017   BUN 29 (H) 11/03/2017   CO2 24 11/03/2017   TSH 1.42 11/03/2017   PSA 0.03 (L) 06/29/2009   INR 1.1 04/08/2008   HGBA1C 8.8 (H) 11/03/2017   MICROALBUR 2.2 (H) 07/03/2017    Dg Os Calcis Right  Result Date: 07/04/2017 CLINICAL DATA:  Right heel pain for 2 months without known injury. EXAM: RIGHT OS CALCIS - 2+ VIEW COMPARISON:  None. FINDINGS: There is no evidence of fracture or other focal bone lesions. Vascular calcifications are noted. IMPRESSION: Normal right calcaneus. Electronically Signed   By: Marijo Conception, M.D.   On: 07/04/2017 08:42    Assessment & Plan:   Burnis was seen today for headache and hypertension.  Diagnoses  and all orders for this visit:  Essential hypertension- His blood pressure is not adequately well controlled.  I have asked him to be more compliant with the 3 listed antihypertensives. -     Urinalysis, Routine w reflex microscopic; Future -     TSH; Future -     Basic metabolic panel; Future  Intractable episodic headache, unspecified headache type- His sed rate is mildly but not significantly elevated so I do not think he has temporal arteritis.  The other labs are not remarkable for metabolic or infectious causes of a headache.  I have asked him undergo a CT without contrast to see if there is a recurrence of the subdural hematoma or a new bleed or mass. -     CT Head Wo Contrast;  Future -     Sedimentation rate; Future  DM (diabetes mellitus), type 2, uncontrolled, periph vascular complic ([place])- His Q8G is up to 8.8%.  I have asked him to follow-up with endocrinology and diabetic education to gain better control of his blood sugars. -     Hemoglobin A1c; Future -     Amb Referral to Nutrition and Diabetic E -     Ambulatory referral to Endocrinology  CRI (chronic renal insufficiency), stage 3 (moderate) ([place])- His renal function is stable.  He was encouraged to avoid nephrotoxic agents. -     Basic metabolic panel; Future  ANEMIA, B12 DEFICIENCY -     CBC with Differential/Platelet; Future -     Vitamin B12; Future -     Folate; Future -     cyanocobalamin ((VITAMIN B-12)) injection 1,000 mcg   I have discontinued Jasiah Colaizzi's CVS ASPIRIN LOW DOSE. I am also having him maintain his nitroGLYCERIN, linaclotide, acetaminophen, B-D INS SYR ULTRAFINE 1CC/30G, glucose blood, NOVOLIN 70/30, atorvastatin, telmisartan, hydrochlorothiazide, omeprazole, metoprolol tartrate, allopurinol, ONETOUCH DELICA LANCETS 50I, and isosorbide mononitrate. We administered cyanocobalamin.  Meds ordered this encounter  Medications  . cyanocobalamin ((VITAMIN B-12)) injection 1,000 mcg     Follow-up:  Return in about 3 weeks (around 11/24/2017).  [name], MD

## 2017-11-03 NOTE — Patient Instructions (Signed)

## 2017-11-04 ENCOUNTER — Ambulatory Visit (INDEPENDENT_AMBULATORY_CARE_PROVIDER_SITE_OTHER)
Admission: RE | Admit: 2017-11-04 | Discharge: 2017-11-04 | Disposition: A | Discharge status: Home / Self Care | Payer: Medicare Other | Source: Ambulatory Visit | Attending: Internal Medicine | Admitting: Internal Medicine

## 2017-11-04 DIAGNOSIS — L602 Onychogryphosis: Secondary | ICD-10-CM | POA: Diagnosis not present

## 2017-11-04 DIAGNOSIS — R519 Headache, unspecified: Secondary | ICD-10-CM

## 2017-11-04 DIAGNOSIS — R51 Headache: Secondary | ICD-10-CM | POA: Diagnosis not present

## 2017-11-04 DIAGNOSIS — E1351 Other specified diabetes mellitus with diabetic peripheral angiopathy without gangrene: Secondary | ICD-10-CM | POA: Diagnosis not present

## 2017-11-04 DIAGNOSIS — L84 Corns and callosities: Secondary | ICD-10-CM | POA: Diagnosis not present

## 2017-11-05 ENCOUNTER — Ambulatory Visit: Payer: Medicare Other

## 2017-11-15 ENCOUNTER — Other Ambulatory Visit: Payer: Self-pay | Admitting: Endocrinology

## 2017-11-15 NOTE — Telephone Encounter (Signed)
Please refill x 1 Ov is due  

## 2017-11-25 ENCOUNTER — Ambulatory Visit (INDEPENDENT_AMBULATORY_CARE_PROVIDER_SITE_OTHER): Payer: Medicare Other | Admitting: Internal Medicine

## 2017-11-25 ENCOUNTER — Encounter: Payer: Self-pay | Admitting: Internal Medicine

## 2017-11-25 VITALS — BP 180/90 | HR 66 | Temp 97.6°F | Ht 72.0 in | Wt 207.8 lb

## 2017-11-25 DIAGNOSIS — E1151 Type 2 diabetes mellitus with diabetic peripheral angiopathy without gangrene: Secondary | ICD-10-CM

## 2017-11-25 DIAGNOSIS — R011 Cardiac murmur, unspecified: Secondary | ICD-10-CM | POA: Diagnosis not present

## 2017-11-25 DIAGNOSIS — I70213 Atherosclerosis of native arteries of extremities with intermittent claudication, bilateral legs: Secondary | ICD-10-CM

## 2017-11-25 DIAGNOSIS — I10 Essential (primary) hypertension: Secondary | ICD-10-CM | POA: Diagnosis not present

## 2017-11-25 DIAGNOSIS — E1165 Type 2 diabetes mellitus with hyperglycemia: Secondary | ICD-10-CM | POA: Diagnosis not present

## 2017-11-25 DIAGNOSIS — IMO0002 Reserved for concepts with insufficient information to code with codable children: Secondary | ICD-10-CM

## 2017-11-25 NOTE — Patient Instructions (Signed)

## 2017-11-25 NOTE — Progress Notes (Signed)
Subjective:  Patient ID: Robert Clayton, male    DOB: [birth year]  Age: 83 y.o. MRN: [id number]  CC: Hypertension and Diabetes   HPI Robert Clayton presents for f/up - He is concerned that his blood pressure is not well controlled and he continues to have intermittent headaches.  He tells me he is taking all of his antihypertensives but when I asked him how many meds he is taking for high blood pressure he says only one.  Sounds like he is only compliant with 1 of the 3.  His wife is not able to help him with his medications.  He denies blurred vision, CP, DOE, palpitations, edema, or fatigue.  Outpatient Medications Prior to Visit  Medication Sig Dispense Refill  . acetaminophen (TYLENOL) 325 MG tablet Take 650 mg by mouth every 6 (six) hours as needed.    [name] allopurinol (ZYLOPRIM) 300 MG tablet TAKE 1 TABLET (300 MG TOTAL) BY MOUTH DAILY. 90 tablet 1  . atorvastatin (LIPITOR) 10 MG tablet Take 1 tablet (10 mg total) by mouth daily. 90 tablet 3  . B-D INS SYR ULTRAFINE 1CC/30G 30G X 1/2" 1 ML MISC USE AS DIRECTED TWICE A DAY 200 each 3  . glucose blood (COOL BLOOD GLUCOSE TEST STRIPS) test strip Use to test blood sugar bid. DX E11.51 200 each 3  . hydrochlorothiazide (HYDRODIURIL) 12.5 MG tablet TAKE 1 TABLET BY MOUTH EVERY DAY 90 tablet 1  . isosorbide mononitrate (IMDUR) 30 MG 24 hr tablet Take 1 tablet (30 mg total) by mouth daily. 90 tablet 1  . Lancets (ONETOUCH DELICA PLUS [id number]) MISC TEST 2 TIMES DAILY. DX: E11.51 100 each 0  . metoprolol tartrate (LOPRESSOR) 50 MG tablet TAKE 1 TABLET TWICE A DAY 180 tablet 1  . nitroGLYCERIN (NITROSTAT) 0.4 MG SL tablet Place 1 tablet (0.4 mg total) under the tongue every 5 (five) minutes as needed. x3 doses as needed for chest pain 30 tablet 0  . NOVOLIN 70/30 (70-30) 100 UNIT/ML injection 95 units with breakfast, and 30 units with supper. 110 mL 3  . omeprazole (PRILOSEC) 40 MG capsule TAKE 1 CAPSULE BY MOUTH EVERY DAY 90 capsule 1  . telmisartan  (MICARDIS) 80 MG tablet TAKE 1 TABLET BY MOUTH EVERY DAY 90 tablet 1  . linaclotide (LINZESS) 72 MCG capsule Take 1 capsule (72 mcg total) by mouth daily before breakfast. 90 capsule 1   No facility-administered medications prior to visit.     ROS Review of Systems  Constitutional: Negative for diaphoresis, fatigue and unexpected weight change.  HENT: Negative.   Eyes: Negative for visual disturbance.  Respiratory: Negative for cough, chest tightness, shortness of breath and wheezing.   Cardiovascular: Negative for chest pain, palpitations and leg swelling.  Gastrointestinal: Negative for abdominal pain, diarrhea, nausea and vomiting.  Endocrine: Negative.   Genitourinary: Negative.  Negative for difficulty urinating.  Musculoskeletal: Negative.  Negative for arthralgias and myalgias.  Skin: Negative.  Negative for color change.  Neurological: Positive for headaches. Negative for dizziness, weakness and light-headedness.  Hematological: Negative for adenopathy. Does not bruise/bleed easily.  Psychiatric/Behavioral: Negative.     Objective:  BP (!) 180/90 (BP Location: Left Arm, Patient Position: Sitting, Cuff Size: Normal)   Pulse 66   Temp 97.6 F (36.4 C) (Oral)   Ht 6' (1.829 m)   Wt 207 lb 12 oz (94.2 kg)   SpO2 98%   BMI 28.18 kg/m   BP Readings from Last 3 Encounters:  11/25/17 (!) 180/90  11/03/17 (!) 178/80  07/03/17 140/76    Wt Readings from Last 3 Encounters:  11/25/17 207 lb 12 oz (94.2 kg)  11/03/17 207 lb (93.9 kg)  07/03/17 204 lb (92.5 kg)    Physical Exam  Constitutional: He is oriented to person, place, and time. No distress.  HENT:  Mouth/Throat: Oropharynx is clear and moist. No oropharyngeal exudate.  Eyes: Conjunctivae are normal. No scleral icterus.  Neck: Normal range of motion. Neck supple. No JVD present. No thyromegaly present.  Cardiovascular: Normal rate, regular rhythm, S1 normal and S2 normal. Exam reveals no gallop.  Murmur heard.   Systolic murmur is present with a grade of 2/6. 2/6 SEM LLSB  Pulmonary/Chest: Effort normal and breath sounds normal. No respiratory distress. He has no wheezes. He has no rales.  Abdominal: Soft. Bowel sounds are normal. He exhibits no mass. There is no tenderness.  Musculoskeletal: Normal range of motion. He exhibits no edema, tenderness or deformity.  Lymphadenopathy:    He has no cervical adenopathy.  Neurological: He is alert and oriented to person, place, and time.  Skin: Skin is warm and dry. No rash noted. He is not diaphoretic.  Vitals reviewed.   Lab Results  Component Value Date   WBC 8.1 11/03/2017   HGB 12.7 (L) 11/03/2017   HCT 38.9 (L) 11/03/2017   PLT 101.0 (L) 11/03/2017   GLUCOSE 337 (H) 11/03/2017   CHOL 120 07/03/2017   TRIG 123.0 07/03/2017   HDL 30.30 (L) 07/03/2017   LDLDIRECT 80.6 06/26/2010   LDLCALC 65 07/03/2017   ALT 6 05/29/2016   AST 12 05/29/2016   NA 140 11/03/2017   K 4.5 11/03/2017   CL 110 11/03/2017   CREATININE 2.30 (H) 11/03/2017   BUN 29 (H) 11/03/2017   CO2 24 11/03/2017   TSH 1.42 11/03/2017   PSA 0.03 (L) 06/29/2009   INR 1.1 04/08/2008   HGBA1C 8.8 (H) 11/03/2017   MICROALBUR 2.2 (H) 07/03/2017    Ct Head Wo Contrast  Result Date: 11/04/2017 CLINICAL DATA:  Acute headache, worst headache of life, right-sided, prior subdural hematoma years ago EXAM: CT HEAD WITHOUT CONTRAST TECHNIQUE: Contiguous axial images were obtained from the base of the skull through the vertex without intravenous contrast. COMPARISON:  CT brain scan of 08/29/2016 FINDINGS: Brain: The ventricular system remains prominent as are the cortical sulci indicative of diffuse atrophy. The septum is midline in position. Mild small vessel ischemic change remains throughout the periventricular white matter. No hemorrhage, mass lesion, or acute infarction is seen. Vascular: No vascular abnormality is evident on this unenhanced study. Skull: Changes of right parietal  craniotomy again are noted. No complicating features are seen. Sinuses/Orbits: The paranasal sinuses appear well pneumatized. Other: None. IMPRESSION: 1. No change in atrophy and mild small vessel ischemic change in the periventricular white matter. 2. Stable right parietal craniotomy. Electronically Signed   By: Ivar Drape M.D.   On: 11/04/2017 10:24    Assessment & Plan:   Nikola was seen today for hypertension and diabetes.  Diagnoses and all orders for this visit:  Essential hypertension- His blood pressure is not well controlled due to noncompliance.  I think he needs help from social services so I have sent a referral to [facility]. -     Consult to [place] Management  DM (diabetes mellitus), type 2, uncontrolled, periph vascular complic ([place])- His M0N is up to 8.8%.  I am also concerned he is not compliant with his insulin therapy.  I have asked [facility] to see if they can help with home health care or social services. -     Consult to [place] Management  Murmur, cardiac- He has a new onset systolic murmur over the left lower sternal border.  I have asked him to undergo an echocardiogram to see if he has developed MR or AS. -     ECHOCARDIOGRAM COMPLETE; Future   I have discontinued [name] Arney's linaclotide. I am also having him maintain his nitroGLYCERIN, acetaminophen, B-D INS SYR ULTRAFINE 1CC/30G, glucose blood, NOVOLIN 70/30, atorvastatin, telmisartan, hydrochlorothiazide, omeprazole, metoprolol tartrate, allopurinol, isosorbide mononitrate, and ONETOUCH DELICA PLUS [id number].  No orders of the defined types were placed in this encounter.    Follow-up: Return in about 2 months (around 01/25/2018).  [name], MD

## 2017-11-26 ENCOUNTER — Other Ambulatory Visit: Payer: Self-pay | Admitting: *Deleted

## 2017-11-26 ENCOUNTER — Encounter: Payer: Self-pay | Admitting: *Deleted

## 2017-11-26 NOTE — Patient Outreach (Signed)
[place] [facility]) Care Management  11/26/2017  Robert Clayton 05/03/1930 [id number]   TELEPHONE SCREENING Referral date:11/24/17 Referral source: Dr. [name]- primary care provider Referral reason: Chronic disease self management assistance with diabetes and HTN Insurance: Medicare   Telephone call to patient regarding referral for disease self management assistance from primary care provider's office.             Unable to reach patient. HIPAA compliant voice message left with call back phone number.  PLAN: RNCM will attempt second telephone outreach to patient within 4 business days and will mail unsuccessful outreach letter.   [name] RN,CCM,CDE [place] Management Coordinator Office Phone [phone] Office Fax [phone]

## 2017-12-02 ENCOUNTER — Ambulatory Visit: Payer: Self-pay | Admitting: *Deleted

## 2017-12-02 ENCOUNTER — Other Ambulatory Visit: Payer: Self-pay | Admitting: *Deleted

## 2017-12-02 NOTE — Patient Outreach (Signed)
[place] [facility]) Care Management  12/02/2017  Robert Clayton May 12, 1930 [id number]   TELEPHONE SCREENING Referral date:11/24/17 Referral source: Dr. [name]- primary care provider Referral reason: Chronic disease self management assistance with diabetes and HTN Insurance: Medicare   Telephone call to patient regarding referral for disease self management assistance from primary care provider's office. Patient's wife answered phone and stated [name] was outside.       She agreed for this RNCM to call back at 1:30 pm today to complete telephone assessment and offer services of [place] Management for chronic disease self managment assistance.   [name] RN,CCM,CDE [place] Management Coordinator Office Phone [phone] Office Fax [phone].

## 2017-12-02 NOTE — Patient Outreach (Signed)
[place] [facility]) Care Management  12/02/2017  Robert Clayton 02-12-1930 [id number]   TELEPHONE SCREENING Referral date:11/24/17 Referral source:Dr. [name]- primary care provider Referral reason:Chronic disease self management assistance with diabetes and HTN Insurance:Medicare  Telephone call to patient regardingreferral for disease self management assistance from primary care provider's office. Patient's wife answered phone and stated [name] was still outside raking leaves. She says she does not know when he will be done. [name] asked if the call was related to a nurse coming out to ht house like Dr Ronnald Ramp suggested; advised her it was. Provided this RNCM's contact number and requested [name] return call as soon as possible. [name] says it will "have to be sometime in December" because she is having eye surgery and he will need to help with her recovery.  If no return call in 3-4 business days, will attempt a third and final telephone outreach.  [name] RN,CCM,CDE [place] Management Coordinator Office Phone [phone] Office Fax [phone]

## 2017-12-08 ENCOUNTER — Ambulatory Visit (INDEPENDENT_AMBULATORY_CARE_PROVIDER_SITE_OTHER): Payer: Medicare Other | Admitting: *Deleted

## 2017-12-08 ENCOUNTER — Ambulatory Visit: Payer: Self-pay | Admitting: *Deleted

## 2017-12-08 ENCOUNTER — Other Ambulatory Visit: Payer: Self-pay | Admitting: *Deleted

## 2017-12-08 DIAGNOSIS — E538 Deficiency of other specified B group vitamins: Secondary | ICD-10-CM

## 2017-12-08 MED ORDER — CYANOCOBALAMIN 1000 MCG/ML IJ SOLN
1000.0000 ug | Freq: Once | INTRAMUSCULAR | Status: AC
  Administered 2017-12-08: 1000 ug via INTRAMUSCULAR

## 2017-12-08 NOTE — Progress Notes (Signed)
I have reviewed and agree.

## 2017-12-08 NOTE — Patient Outreach (Signed)
[place] [facility]) Care Management  12/08/2017  Robert Clayton 1930-03-20 [id number]  TELEPHONE SCREENING Referral date:11/24/17 Referral source:Dr. [name]- primary care provider Referral reason:Chronic disease self management assistance with diabetes and HTN Insurance:Medicare   [name] did not return call to this [facility] as requested when message left with Mrs. Cubit on 12/02/17.  Final telephone outreach regarding referral for disease self management assistance from primary care provider's office.        HIPAA compliant voice message left with call back phone number.  PLAN:  RNCM will close case to [place] Management services in 10 business days if no return call from patient.  [name] RN,CCM,CDE [place] Management Coordinator Office Phone [phone] Office Fax [phone]

## 2017-12-09 ENCOUNTER — Ambulatory Visit (INDEPENDENT_AMBULATORY_CARE_PROVIDER_SITE_OTHER): Payer: Medicare Other | Admitting: Internal Medicine

## 2017-12-09 ENCOUNTER — Other Ambulatory Visit: Payer: Medicare Other | Admitting: *Deleted

## 2017-12-09 ENCOUNTER — Encounter: Payer: Self-pay | Admitting: Internal Medicine

## 2017-12-09 ENCOUNTER — Other Ambulatory Visit: Payer: Medicare Other | Admitting: Licensed Clinical Social Worker

## 2017-12-09 VITALS — BP 140/74 | HR 63 | Temp 98.0°F | Ht 72.0 in | Wt 202.0 lb

## 2017-12-09 DIAGNOSIS — I70213 Atherosclerosis of native arteries of extremities with intermittent claudication, bilateral legs: Secondary | ICD-10-CM

## 2017-12-09 DIAGNOSIS — L600 Ingrowing nail: Secondary | ICD-10-CM | POA: Diagnosis not present

## 2017-12-09 DIAGNOSIS — Z515 Encounter for palliative care: Secondary | ICD-10-CM

## 2017-12-09 NOTE — Progress Notes (Signed)
COMMUNITY PALLIATIVE CARE SW NOTE  PATIENT NAME: Robert Clayton DOB: 01/29/1930 MRN: [id number]  PRIMARY CARE PROVIDER: Janith Lima, MD  RESPONSIBLE PARTY:  Acct ID - Guarantor Home Phone Work Phone Relationship Acct Type  [id number] - Inetta Fermo [phone]  Self P/F     [address], [place], [place] [address]     PLAN OF CARE and INTERVENTIONS:             1. GOALS OF CARE/ ADVANCE CARE PLANNING:  Patient wishes to remain at home with his wife and be as independent as possible.  He is a full code. 2. SOCIAL/EMOTIONAL/SPIRITUAL ASSESSMENT/ INTERVENTIONS:  SW met with patient and his wife, Robert Clayton, in their home.  Patient was born and raised in [place].  He married his wife 67 years ago and they have five children.  Patient worked at [employer] in [place].  He belongs to a church with his wife.  He stated he drives daily.  Patient reported not having any needs at this time, but welcomed a future visit.  Patient reports his children live locally and are supportive.  Patient denied any pain or discomfort. 3. PATIENT/CAREGIVER EDUCATION/ COPING:  SW provided education regarding the Palliative Care/[facility] Program.  He said he understood. 4. PERSONAL EMERGENCY PLAN:  SW to further explore. 5. COMMUNITY RESOURCES COORDINATION/ HEALTH CARE NAVIGATION:  None. 6. FINANCIAL/LEGAL CONCERNS/INTERVENTIONS:  None.     SOCIAL HX:  Social History   Tobacco Use  . Smoking status: Never Smoker  . Smokeless tobacco: Never Used  Substance Use Topics  . Alcohol use: No    CODE STATUS:  Full Code  ADVANCED DIRECTIVES: N MOST FORM COMPLETE:  N HOSPICE EDUCATION PROVIDED:  N PPS: Patient reports his appetite is normal.  He is able to stand and walk independently. Duration of visit and documentation:  45 minutes.      Creola Corn Chaelyn Bunyan, LCSW

## 2017-12-09 NOTE — Progress Notes (Signed)
Subjective:  Patient ID: Robert Clayton, male    DOB: [birth year]  Age: 84 y.o. MRN: [id number]  CC: Toe Pain (Left 1st toe x 2 days)   HPI Vadim Centola presents for a 2-day history of painful ingrown toenail in the left great toe.  He has tried to remove the ingrown aspect of the toenail on his own accord but has not been unsuccessful.  He says the area is not red, swollen, or producing any drainage.  Outpatient Medications Prior to Visit  Medication Sig Dispense Refill  . acetaminophen (TYLENOL) 325 MG tablet Take 650 mg by mouth every 6 (six) hours as needed.    [name] allopurinol (ZYLOPRIM) 300 MG tablet TAKE 1 TABLET (300 MG TOTAL) BY MOUTH DAILY. 90 tablet 1  . atorvastatin (LIPITOR) 10 MG tablet Take 1 tablet (10 mg total) by mouth daily. 90 tablet 3  . B-D INS SYR ULTRAFINE 1CC/30G 30G X 1/2" 1 ML MISC USE AS DIRECTED TWICE A DAY 200 each 3  . glucose blood (COOL BLOOD GLUCOSE TEST STRIPS) test strip Use to test blood sugar bid. DX E11.51 200 each 3  . hydrochlorothiazide (HYDRODIURIL) 12.5 MG tablet TAKE 1 TABLET BY MOUTH EVERY DAY 90 tablet 1  . isosorbide mononitrate (IMDUR) 30 MG 24 hr tablet Take 1 tablet (30 mg total) by mouth daily. 90 tablet 1  . Lancets (ONETOUCH DELICA PLUS [id number]) MISC TEST 2 TIMES DAILY. DX: E11.51 100 each 0  . metoprolol tartrate (LOPRESSOR) 50 MG tablet TAKE 1 TABLET TWICE A DAY 180 tablet 1  . nitroGLYCERIN (NITROSTAT) 0.4 MG SL tablet Place 1 tablet (0.4 mg total) under the tongue every 5 (five) minutes as needed. x3 doses as needed for chest pain 30 tablet 0  . NOVOLIN 70/30 (70-30) 100 UNIT/ML injection 95 units with breakfast, and 30 units with supper. 110 mL 3  . omeprazole (PRILOSEC) 40 MG capsule TAKE 1 CAPSULE BY MOUTH EVERY DAY 90 capsule 1  . telmisartan (MICARDIS) 80 MG tablet TAKE 1 TABLET BY MOUTH EVERY DAY 90 tablet 1   No facility-administered medications prior to visit.     ROS Review of Systems  All other systems reviewed and are  negative.   Objective:  BP 140/74 (BP Location: Left Arm, Patient Position: Sitting, Cuff Size: Large)   Pulse 63   Temp 98 F (36.7 C) (Oral)   Ht 6' (1.829 m)   Wt 202 lb (91.6 kg)   SpO2 98%   BMI 27.40 kg/m   BP Readings from Last 3 Encounters:  12/09/17 140/74  11/25/17 (!) 180/90  11/03/17 (!) 178/80    Wt Readings from Last 3 Encounters:  12/09/17 202 lb (91.6 kg)  11/25/17 207 lb 12 oz (94.2 kg)  11/03/17 207 lb (93.9 kg)    Physical Exam  Musculoskeletal:  Medial aspect of left great toenail is ingrown at the base.  There is mild tenderness to palpation but no erythema, exudate, warmth, or fluctuance.  The great toe is cleaned with Betadine and the area was prepped and draped in sterile fashion.  A digital block was performed.  I injected 1 cc of plain 2% Marcaine at each quadrant at the base of the great toe.  After anesthesia was obtained I used a pair of toenail clippers to remove the ingrown aspect of the great toenail.  He tolerated this well.  There was no blood loss or complications.  Sensation and capillary refill is intact.  I placed Neosporin over  the area and a Band-Aid afterwards.    Feet:  Left Foot:  Skin Integrity: Positive for dry skin. Negative for ulcer, blister, skin breakdown, erythema, warmth or callus.    Lab Results  Component Value Date   WBC 8.1 11/03/2017   HGB 12.7 (L) 11/03/2017   HCT 38.9 (L) 11/03/2017   PLT 101.0 (L) 11/03/2017   GLUCOSE 337 (H) 11/03/2017   CHOL 120 07/03/2017   TRIG 123.0 07/03/2017   HDL 30.30 (L) 07/03/2017   LDLDIRECT 80.6 06/26/2010   LDLCALC 65 07/03/2017   ALT 6 05/29/2016   AST 12 05/29/2016   NA 140 11/03/2017   K 4.5 11/03/2017   CL 110 11/03/2017   CREATININE 2.30 (H) 11/03/2017   BUN 29 (H) 11/03/2017   CO2 24 11/03/2017   TSH 1.42 11/03/2017   PSA 0.03 (L) 06/29/2009   INR 1.1 04/08/2008   HGBA1C 8.8 (H) 11/03/2017   MICROALBUR 2.2 (H) 07/03/2017    Ct Head Wo Contrast  Result Date:  11/04/2017 CLINICAL DATA:  Acute headache, worst headache of life, right-sided, prior subdural hematoma years ago EXAM: CT HEAD WITHOUT CONTRAST TECHNIQUE: Contiguous axial images were obtained from the base of the skull through the vertex without intravenous contrast. COMPARISON:  CT brain scan of 08/29/2016 FINDINGS: Brain: The ventricular system remains prominent as are the cortical sulci indicative of diffuse atrophy. The septum is midline in position. Mild small vessel ischemic change remains throughout the periventricular white matter. No hemorrhage, mass lesion, or acute infarction is seen. Vascular: No vascular abnormality is evident on this unenhanced study. Skull: Changes of right parietal craniotomy again are noted. No complicating features are seen. Sinuses/Orbits: The paranasal sinuses appear well pneumatized. Other: None. IMPRESSION: 1. No change in atrophy and mild small vessel ischemic change in the periventricular white matter. 2. Stable right parietal craniotomy. Electronically Signed   By: Ivar Drape M.D.   On: 11/04/2017 10:24    Assessment & Plan:   Eileen was seen today for toe pain.  Diagnoses and all orders for this visit:  Ingrown left big toenail- He will continue to apply triple antibiotic ointment to the area and keep the area protected and covered for the next week.  He will let me know if he develops any complications such as pain, swelling, bleeding, redness, or drainage.  He will return in 5 to 7 days for me to recheck the area.   I am having Inetta Fermo maintain his nitroGLYCERIN, acetaminophen, B-D INS SYR ULTRAFINE 1CC/30G, glucose blood, NOVOLIN 70/30, atorvastatin, telmisartan, hydrochlorothiazide, omeprazole, metoprolol tartrate, allopurinol, isosorbide mononitrate, and ONETOUCH DELICA PLUS [id number].  No orders of the defined types were placed in this encounter.    Follow-up: Return in about 1 week (around 12/16/2017).  [name], MD

## 2017-12-09 NOTE — Patient Instructions (Signed)
Ingrown Toenail An ingrown toenail occurs when the corner or sides of your toenail grow into the surrounding skin. The big toe is most commonly affected, but it can happen to any of your toes. If your ingrown toenail is not treated, you will be at risk for infection. What are the causes? This condition may be caused by:  Wearing shoes that are too small or tight.  Injury or trauma, such as stubbing your toe or having your toe stepped on.  Improper cutting or care of your toenails.  Being born with (congenital) nail or foot abnormalities, such as having a nail that is too big for your toe.  What increases the risk? Risk factors for an ingrown toenail include:  Age. Your nails tend to thicken as you get older, so ingrown nails are more common in older people.  Diabetes.  Cutting your toenails incorrectly.  Blood circulation problems.  What are the signs or symptoms? Symptoms may include:  Pain, soreness, or tenderness.  Redness.  Swelling.  Hardening of the skin surrounding the toe.  Your ingrown toenail may be infected if there is fluid, pus, or drainage. How is this diagnosed? An ingrown toenail may be diagnosed by medical history and physical exam. If your toenail is infected, your health care provider may test a sample of the drainage. How is this treated? Treatment depends on the severity of your ingrown toenail. Some ingrown toenails may be treated at home. More severe or infected ingrown toenails may require surgery to remove all or part of the nail. Infected ingrown toenails may also be treated with antibiotic medicines. Follow these instructions at home:  If you were prescribed an antibiotic medicine, finish all of it even if you start to feel better.  Soak your foot in warm soapy water for 20 minutes, 3 times per day or as directed by your health care provider.  Carefully lift the edge of the nail away from the sore skin by wedging a small piece of cotton under  the corner of the nail. This may help with the pain. Be careful not to cause more injury to the area.  Wear shoes that fit well. If your ingrown toenail is causing you pain, try wearing sandals, if possible.  Trim your toenails regularly and carefully. Do not cut them in a curved shape. Cut your toenails straight across. This prevents injury to the skin at the corners of the toenail.  Keep your feet clean and dry.  If you are having trouble walking and are given crutches by your health care provider, use them as directed.  Do not pick at your toenail or try to remove it yourself.  Take medicines only as directed by your health care provider.  Keep all follow-up visits as directed by your health care provider. This is important. Contact a health care provider if:  Your symptoms do not improve with treatment. Get help right away if:  You have red streaks that start at your foot and go up your leg.  You have a fever.  You have increased redness, swelling, or pain.  You have fluid, blood, or pus coming from your toenail. This information is not intended to replace advice given to you by your health care provider. Make sure you discuss any questions you have with your health care provider. Document Released: 12/22/1999 Document Revised: 05/26/2015 Document Reviewed: 11/17/2013 Elsevier Interactive Patient Education  2018 Elsevier Inc.  

## 2017-12-12 ENCOUNTER — Other Ambulatory Visit: Payer: Self-pay | Admitting: *Deleted

## 2017-12-12 NOTE — Patient Outreach (Signed)
[facility]) Care Management  12/12/2017  Robert Clayton 1930/09/06 [id number]   Case closure due to inability to speak with patient despite 3 telephone outreaches and l;eaving one message with wife requesting return call from patient.  Will route case closure letter to patient and primary care provider.  [name] RN,CCM,CDE [place] Management Coordinator Office Phone [phone] Office Fax [phone]

## 2017-12-16 ENCOUNTER — Encounter: Payer: Self-pay | Admitting: Internal Medicine

## 2017-12-16 ENCOUNTER — Ambulatory Visit (INDEPENDENT_AMBULATORY_CARE_PROVIDER_SITE_OTHER): Payer: Medicare Other | Admitting: Internal Medicine

## 2017-12-16 VITALS — BP 140/70 | HR 61 | Temp 98.2°F | Resp 16 | Ht 72.0 in | Wt 203.0 lb

## 2017-12-16 DIAGNOSIS — L600 Ingrowing nail: Secondary | ICD-10-CM | POA: Diagnosis not present

## 2017-12-16 DIAGNOSIS — I70213 Atherosclerosis of native arteries of extremities with intermittent claudication, bilateral legs: Secondary | ICD-10-CM | POA: Diagnosis not present

## 2017-12-16 NOTE — Progress Notes (Signed)
Subjective:  Patient ID: Robert Clayton, male    DOB: [birth year]  Age: 84 y.o. MRN: [id number]  CC: Wound Check   HPI Cliford Sequeira presents for f/up -is one-week status post removal of an ingrown toenail on the left side.  He tells me the area has healed nicely.  There is no pain, redness, swelling, or drainage.  Outpatient Medications Prior to Visit  Medication Sig Dispense Refill  . acetaminophen (TYLENOL) 325 MG tablet Take 650 mg by mouth every 6 (six) hours as needed.    [name] allopurinol (ZYLOPRIM) 300 MG tablet TAKE 1 TABLET (300 MG TOTAL) BY MOUTH DAILY. 90 tablet 1  . atorvastatin (LIPITOR) 10 MG tablet Take 1 tablet (10 mg total) by mouth daily. 90 tablet 3  . B-D INS SYR ULTRAFINE 1CC/30G 30G X 1/2" 1 ML MISC USE AS DIRECTED TWICE A DAY 200 each 3  . glucose blood (COOL BLOOD GLUCOSE TEST STRIPS) test strip Use to test blood sugar bid. DX E11.51 200 each 3  . hydrochlorothiazide (HYDRODIURIL) 12.5 MG tablet TAKE 1 TABLET BY MOUTH EVERY DAY 90 tablet 1  . isosorbide mononitrate (IMDUR) 30 MG 24 hr tablet Take 1 tablet (30 mg total) by mouth daily. 90 tablet 1  . Lancets (ONETOUCH DELICA PLUS [id number]) MISC TEST 2 TIMES DAILY. DX: E11.51 100 each 0  . metoprolol tartrate (LOPRESSOR) 50 MG tablet TAKE 1 TABLET TWICE A DAY 180 tablet 1  . nitroGLYCERIN (NITROSTAT) 0.4 MG SL tablet Place 1 tablet (0.4 mg total) under the tongue every 5 (five) minutes as needed. x3 doses as needed for chest pain 30 tablet 0  . NOVOLIN 70/30 (70-30) 100 UNIT/ML injection 95 units with breakfast, and 30 units with supper. 110 mL 3  . omeprazole (PRILOSEC) 40 MG capsule TAKE 1 CAPSULE BY MOUTH EVERY DAY 90 capsule 1  . telmisartan (MICARDIS) 80 MG tablet TAKE 1 TABLET BY MOUTH EVERY DAY 90 tablet 1   No facility-administered medications prior to visit.     ROS Review of Systems  All other systems reviewed and are negative.   Objective:  BP 140/70 (BP Location: Left Arm, Patient Position: Sitting,  Cuff Size: Large)   Pulse 61   Temp 98.2 F (36.8 C) (Oral)   Resp 16   Ht 6' (1.829 m)   Wt 203 lb (92.1 kg)   SpO2 99%   BMI 27.53 kg/m   BP Readings from Last 3 Encounters:  12/16/17 140/70  12/09/17 140/74  11/25/17 (!) 180/90    Wt Readings from Last 3 Encounters:  12/16/17 203 lb (92.1 kg)  12/09/17 202 lb (91.6 kg)  11/25/17 207 lb 12 oz (94.2 kg)    Physical Exam  Musculoskeletal:       Feet:    Lab Results  Component Value Date   WBC 8.1 11/03/2017   HGB 12.7 (L) 11/03/2017   HCT 38.9 (L) 11/03/2017   PLT 101.0 (L) 11/03/2017   GLUCOSE 337 (H) 11/03/2017   CHOL 120 07/03/2017   TRIG 123.0 07/03/2017   HDL 30.30 (L) 07/03/2017   LDLDIRECT 80.6 06/26/2010   LDLCALC 65 07/03/2017   ALT 6 05/29/2016   AST 12 05/29/2016   NA 140 11/03/2017   K 4.5 11/03/2017   CL 110 11/03/2017   CREATININE 2.30 (H) 11/03/2017   BUN 29 (H) 11/03/2017   CO2 24 11/03/2017   TSH 1.42 11/03/2017   PSA 0.03 (L) 06/29/2009   INR 1.1 04/08/2008   HGBA1C  8.8 (H) 11/03/2017   MICROALBUR 2.2 (H) 07/03/2017    Ct Head Wo Contrast  Result Date: 11/04/2017 CLINICAL DATA:  Acute headache, worst headache of life, right-sided, prior subdural hematoma years ago EXAM: CT HEAD WITHOUT CONTRAST TECHNIQUE: Contiguous axial images were obtained from the base of the skull through the vertex without intravenous contrast. COMPARISON:  CT brain scan of 08/29/2016 FINDINGS: Brain: The ventricular system remains prominent as are the cortical sulci indicative of diffuse atrophy. The septum is midline in position. Mild small vessel ischemic change remains throughout the periventricular white matter. No hemorrhage, mass lesion, or acute infarction is seen. Vascular: No vascular abnormality is evident on this unenhanced study. Skull: Changes of right parietal craniotomy again are noted. No complicating features are seen. Sinuses/Orbits: The paranasal sinuses appear well pneumatized. Other: None.  IMPRESSION: 1. No change in atrophy and mild small vessel ischemic change in the periventricular white matter. 2. Stable right parietal craniotomy. Electronically Signed   By: Ivar Drape M.D.   On: 11/04/2017 10:24    Assessment & Plan:   Callen was seen today for wound check.  Diagnoses and all orders for this visit:  Ingrown left big toenail- He has undergone successful extraction of the ingrown aspect of the left great toenail.  The area has healed nicely with no complications.   I am having Inetta Fermo maintain his nitroGLYCERIN, acetaminophen, B-D INS SYR ULTRAFINE 1CC/30G, glucose blood, NOVOLIN 70/30, atorvastatin, telmisartan, hydrochlorothiazide, omeprazole, metoprolol tartrate, allopurinol, isosorbide mononitrate, and ONETOUCH DELICA PLUS [id number].  No orders of the defined types were placed in this encounter.    Follow-up: Return if symptoms worsen or fail to improve.  [name], MD

## 2017-12-16 NOTE — Patient Instructions (Signed)

## 2017-12-26 ENCOUNTER — Other Ambulatory Visit: Payer: Self-pay

## 2017-12-26 ENCOUNTER — Ambulatory Visit (HOSPITAL_COMMUNITY): Payer: Medicare Other | Attending: Internal Medicine

## 2017-12-26 DIAGNOSIS — R011 Cardiac murmur, unspecified: Secondary | ICD-10-CM | POA: Diagnosis not present

## 2018-01-08 ENCOUNTER — Ambulatory Visit (INDEPENDENT_AMBULATORY_CARE_PROVIDER_SITE_OTHER): Payer: Medicare Other | Admitting: *Deleted

## 2018-01-08 DIAGNOSIS — E538 Deficiency of other specified B group vitamins: Secondary | ICD-10-CM | POA: Diagnosis not present

## 2018-01-08 MED ORDER — CYANOCOBALAMIN 1000 MCG/ML IJ SOLN
1000.0000 ug | Freq: Once | INTRAMUSCULAR | Status: AC
  Administered 2018-01-08: 1000 ug via INTRAMUSCULAR

## 2018-01-08 NOTE — Progress Notes (Signed)
I have reviewed and agree.

## 2018-01-13 DIAGNOSIS — E1351 Other specified diabetes mellitus with diabetic peripheral angiopathy without gangrene: Secondary | ICD-10-CM | POA: Diagnosis not present

## 2018-01-13 DIAGNOSIS — L602 Onychogryphosis: Secondary | ICD-10-CM | POA: Diagnosis not present

## 2018-01-26 ENCOUNTER — Ambulatory Visit (INDEPENDENT_AMBULATORY_CARE_PROVIDER_SITE_OTHER): Payer: Medicare Other | Admitting: Internal Medicine

## 2018-01-26 ENCOUNTER — Encounter: Payer: Self-pay | Admitting: Internal Medicine

## 2018-01-26 ENCOUNTER — Other Ambulatory Visit: Payer: Self-pay | Admitting: Internal Medicine

## 2018-01-26 VITALS — BP 144/80 | HR 71 | Temp 98.1°F | Resp 16 | Ht 72.0 in | Wt 199.0 lb

## 2018-01-26 DIAGNOSIS — E1151 Type 2 diabetes mellitus with diabetic peripheral angiopathy without gangrene: Secondary | ICD-10-CM

## 2018-01-26 DIAGNOSIS — I5030 Unspecified diastolic (congestive) heart failure: Secondary | ICD-10-CM | POA: Diagnosis not present

## 2018-01-26 DIAGNOSIS — E1140 Type 2 diabetes mellitus with diabetic neuropathy, unspecified: Secondary | ICD-10-CM

## 2018-01-26 DIAGNOSIS — I110 Hypertensive heart disease with heart failure: Secondary | ICD-10-CM

## 2018-01-26 DIAGNOSIS — E1165 Type 2 diabetes mellitus with hyperglycemia: Principal | ICD-10-CM

## 2018-01-26 DIAGNOSIS — IMO0002 Reserved for concepts with insufficient information to code with codable children: Secondary | ICD-10-CM

## 2018-01-26 HISTORY — DX: Unspecified diastolic (congestive) heart failure: I50.30

## 2018-01-26 LAB — POCT GLYCOSYLATED HEMOGLOBIN (HGB A1C): Hemoglobin A1C: 11.2 % — AB (ref 4.0–5.6)

## 2018-01-26 LAB — POCT GLUCOSE (DEVICE FOR HOME USE): Glucose Fasting, POC: 501 mg/dL — AB (ref 70–99)

## 2018-01-26 MED ORDER — GLUCOSE BLOOD VI STRP: ORAL_STRIP | 1 refills | Status: DC

## 2018-01-26 MED ORDER — INSULIN DEGLUDEC 100 UNIT/ML ~~LOC~~ SOPN: 50.0000 [IU] | PEN_INJECTOR | Freq: Every day | SUBCUTANEOUS | 1 refills | Status: DC

## 2018-01-26 MED ORDER — INSULIN PEN NEEDLE 32G X 6 MM MISC: 1.0000 | Freq: Three times a day (TID) | 1 refills | Status: DC

## 2018-01-26 MED ORDER — INSULIN ASPART 100 UNIT/ML FLEXPEN: 10.0000 [IU] | PEN_INJECTOR | Freq: Three times a day (TID) | SUBCUTANEOUS | 1 refills | Status: DC

## 2018-01-26 MED ORDER — ACCU-CHEK GUIDE ME W/DEVICE KIT: 1.0000 | PACK | Freq: Three times a day (TID) | 0 refills | Status: DC

## 2018-01-26 NOTE — Patient Instructions (Signed)
Type 2 Diabetes Mellitus, Diagnosis, Adult Type 2 diabetes (type 2 diabetes mellitus) is a long-term (chronic) disease. In type 2 diabetes, one or both of these problems may be present:  The pancreas does not make enough of a hormone called insulin.  Cells in the body do not respond properly to insulin that the body makes (insulin resistance). Normally, insulin allows blood sugar (glucose) to enter cells in the body. The cells use glucose for energy. Insulin resistance or lack of insulin causes excess glucose to build up in the blood instead of going into cells. As a result, high blood glucose (hyperglycemia) develops. What increases the risk? The following factors may make you more likely to develop type 2 diabetes:  Having a family member with type 2 diabetes.  Being overweight or obese.  Having an inactive (sedentary) lifestyle.  Having been diagnosed with insulin resistance.  Having a history of prediabetes, gestational diabetes, or polycystic ovary syndrome (PCOS).  Being of American-Indian, African-American, Hispanic/Latino, or Asian/Pacific Islander descent. What are the signs or symptoms? In the early stage of this condition, you may not have symptoms. Symptoms develop slowly and may include:  Increased thirst (polydipsia).  Increased hunger(polyphagia).  Increased urination (polyuria).  Increased urination during the night (nocturia).  Unexplained weight loss.  Frequent infections that keep coming back (recurring).  Fatigue.  Weakness.  Vision changes, such as blurry vision.  Cuts or bruises that are slow to heal.  Tingling or numbness in the hands or feet.  Dark patches on the skin (acanthosis nigricans). How is this diagnosed? This condition is diagnosed based on your symptoms, your medical history, a physical exam, and your blood glucose level. Your blood glucose may be checked with one or more of the following blood tests:  A fasting blood glucose (FBG)  test. You will not be allowed to eat (you will fast) for 8 hours or longer before a blood sample is taken.  A random blood glucose test. This test checks blood glucose at any time of day regardless of when you ate.  An A1c (hemoglobin A1c) blood test. This test provides information about blood glucose control over the previous 2-3 months.  An oral glucose tolerance test (OGTT). This test measures your blood glucose at two times: ? After fasting. This is your baseline blood glucose level. ? Two hours after drinking a beverage that contains glucose. You may be diagnosed with type 2 diabetes if:  Your FBG level is 126 mg/dL (7.0 mmol/L) or higher.  Your random blood glucose level is 200 mg/dL (11.1 mmol/L) or higher.  Your A1c level is 6.5% or higher.  Your OGTT result is higher than 200 mg/dL (11.1 mmol/L). These blood tests may be repeated to confirm your diagnosis. How is this treated? Your treatment may be managed by a specialist called an endocrinologist. Type 2 diabetes may be treated by following instructions from your health care provider about:  Making diet and lifestyle changes. This may include: ? Following an individualized nutrition plan that is developed by a diet and nutrition specialist (registered dietitian). ? Exercising regularly. ? Finding ways to manage stress.  Checking your blood glucose level as often as told.  Taking diabetes medicines or insulin daily. This helps to keep your blood glucose levels in the healthy range. ? If you use insulin, you may need to adjust the dosage depending on how physically active you are and what foods you eat. Your health care provider will tell you how to adjust your dosage.    Taking medicines to help prevent complications from diabetes, such as: ? Aspirin. ? Medicine to lower cholesterol. ? Medicine to control blood pressure. Your health care provider will set individualized treatment goals for you. Your goals will be based on  your age, other medical conditions you have, and how you respond to diabetes treatment. Generally, the goal of treatment is to maintain the following blood glucose levels:  Before meals (preprandial): 80-130 mg/dL (4.4-7.2 mmol/L).  After meals (postprandial): below 180 mg/dL (10 mmol/L).  A1c level: less than 7%. Follow these instructions at home: Questions to ask your health care provider  Consider asking the following questions: ? Do I need to meet with a diabetes educator? ? Where can I find a support group for people with diabetes? ? What equipment will I need to manage my diabetes at home? ? What diabetes medicines do I need, and when should I take them? ? How often do I need to check my blood glucose? ? What number can I call if I have questions? ? When is my next appointment? General instructions  Take over-the-counter and prescription medicines only as told by your health care provider.  Keep all follow-up visits as told by your health care provider. This is important.  For more information about diabetes, visit: ? American Diabetes Association (ADA): www.diabetes.org ? American Association of Diabetes Educators (AADE): www.diabeteseducator.org Contact a health care provider if:  Your blood glucose is at or above 240 mg/dL (13.3 mmol/L) for 2 days in a row.  You have been sick or have had a fever for 2 days or longer, and you are not getting better.  You have any of the following problems for more than 6 hours: ? You cannot eat or drink. ? You have nausea and vomiting. ? You have diarrhea. Get help right away if:  Your blood glucose is lower than 54 mg/dL (3.0 mmol/L).  You become confused or you have trouble thinking clearly.  You have difficulty breathing.  You have moderate or large ketone levels in your urine. Summary  Type 2 diabetes (type 2 diabetes mellitus) is a long-term (chronic) disease. In type 2 diabetes, the pancreas does not make enough of a  hormone called insulin, or cells in the body do not respond properly to insulin that the body makes (insulin resistance).  This condition is treated by making diet and lifestyle changes and taking diabetes medicines or insulin.  Your health care provider will set individualized treatment goals for you. Your goals will be based on your age, other medical conditions you have, and how you respond to diabetes treatment.  Keep all follow-up visits as told by your health care provider. This is important. This information is not intended to replace advice given to you by your health care provider. Make sure you discuss any questions you have with your health care provider. Document Released: 12/24/2004 Document Revised: 07/25/2016 Document Reviewed: 01/27/2015 Elsevier Interactive Patient Education  2019 [employer] Inc.  

## 2018-01-26 NOTE — Progress Notes (Signed)
Subjective:  Patient ID: Robert Clayton, male    DOB: [birth year]  Age: 85 y.o. MRN: [id number]  CC: Hypertension and Diabetes   HPI Tyeson Tanimoto presents for f/up - He offers no complaints today.  Since I last saw him he underwent an echocardiogram for evaluation of murmur.  He was found to have LVH with a intraventricular gradient.  He was also found to have grade 1 diastolic dysfunction.  He denies any recent episodes of CP, DOE, palpitations, edema, or fatigue.  He is not currently monitoring his blood sugars.  He said he does not use the Novolin 70/30 very much because it causes cramps.  Outpatient Medications Prior to Visit  Medication Sig Dispense Refill  . acetaminophen (TYLENOL) 325 MG tablet Take 650 mg by mouth every 6 (six) hours as needed.    [name] allopurinol (ZYLOPRIM) 300 MG tablet TAKE 1 TABLET (300 MG TOTAL) BY MOUTH DAILY. 90 tablet 1  . atorvastatin (LIPITOR) 10 MG tablet Take 1 tablet (10 mg total) by mouth daily. 90 tablet 3  . hydrochlorothiazide (HYDRODIURIL) 12.5 MG tablet TAKE 1 TABLET BY MOUTH EVERY DAY 90 tablet 1  . isosorbide mononitrate (IMDUR) 30 MG 24 hr tablet Take 1 tablet (30 mg total) by mouth daily. 90 tablet 1  . Lancets (ONETOUCH DELICA PLUS [id number]) MISC TEST 2 TIMES DAILY. DX: E11.51 100 each 0  . metoprolol tartrate (LOPRESSOR) 50 MG tablet TAKE 1 TABLET TWICE A DAY 180 tablet 1  . nitroGLYCERIN (NITROSTAT) 0.4 MG SL tablet Place 1 tablet (0.4 mg total) under the tongue every 5 (five) minutes as needed. x3 doses as needed for chest pain 30 tablet 0  . omeprazole (PRILOSEC) 40 MG capsule TAKE 1 CAPSULE BY MOUTH EVERY DAY 90 capsule 1  . telmisartan (MICARDIS) 80 MG tablet TAKE 1 TABLET BY MOUTH EVERY DAY 90 tablet 1  . B-D INS SYR ULTRAFINE 1CC/30G 30G X 1/2" 1 ML MISC USE AS DIRECTED TWICE A DAY 200 each 3  . glucose blood (COOL BLOOD GLUCOSE TEST STRIPS) test strip Use to test blood sugar bid. DX E11.51 200 each 3  . NOVOLIN 70/30 (70-30) 100 UNIT/ML  injection 95 units with breakfast, and 30 units with supper. 110 mL 3   No facility-administered medications prior to visit.     ROS Review of Systems  Constitutional: Negative.  Negative for appetite change, diaphoresis, fatigue and unexpected weight change.  HENT: Negative.   Eyes: Negative for visual disturbance.  Respiratory: Negative for cough, chest tightness, shortness of breath and wheezing.   Cardiovascular: Negative for chest pain, palpitations and leg swelling.  Gastrointestinal: Negative for abdominal pain, constipation, diarrhea, nausea and vomiting.  Endocrine: Negative for polydipsia, polyphagia and polyuria.  Genitourinary: Negative.  Negative for difficulty urinating and dysuria.  Musculoskeletal: Negative.  Negative for arthralgias, back pain, myalgias and neck pain.  Skin: Negative.  Negative for pallor.  Neurological: Negative.  Negative for dizziness, weakness, light-headedness and headaches.  Hematological: Negative for adenopathy. Does not bruise/bleed easily.  Psychiatric/Behavioral: Negative.     Objective:  BP (!) 144/80 (BP Location: Left Arm, Patient Position: Sitting, Cuff Size: Normal)   Pulse 71   Temp 98.1 F (36.7 C) (Oral)   Resp 16   Ht 6' (1.829 m)   Wt 199 lb (90.3 kg)   SpO2 97%   BMI 26.99 kg/m   BP Readings from Last 3 Encounters:  01/26/18 (!) 144/80  12/16/17 140/70  12/09/17 140/74    Wt  Readings from Last 3 Encounters:  01/26/18 199 lb (90.3 kg)  12/16/17 203 lb (92.1 kg)  12/09/17 202 lb (91.6 kg)    Physical Exam Vitals signs reviewed.  HENT:     Nose: Nose normal. No congestion or rhinorrhea.     Mouth/Throat:     Mouth: Mucous membranes are moist.     Pharynx: Oropharynx is clear. No oropharyngeal exudate or posterior oropharyngeal erythema.  Eyes:     General: No scleral icterus.    Conjunctiva/sclera: Conjunctivae normal.  Neck:     Musculoskeletal: Normal range of motion and neck supple.  Cardiovascular:      Rate and Rhythm: Normal rate and regular rhythm.     Heart sounds: Murmur present. Systolic murmur present with a grade of 2/6. No diastolic murmur. No gallop.      Comments: 2/6 SEM Pulmonary:     Effort: Pulmonary effort is normal.     Breath sounds: No stridor. No wheezing, rhonchi or rales.  Abdominal:     General: Abdomen is flat.     Palpations: There is no hepatomegaly, splenomegaly or mass.     Tenderness: There is no abdominal tenderness. There is no guarding.  Musculoskeletal: Normal range of motion.        General: No swelling.  Skin:    General: Skin is warm and dry.     Findings: No rash.  Neurological:     General: No focal deficit present.     Mental Status: He is oriented to person, place, and time. Mental status is at baseline.     Lab Results  Component Value Date   WBC 8.1 11/03/2017   HGB 12.7 (L) 11/03/2017   HCT 38.9 (L) 11/03/2017   PLT 101.0 (L) 11/03/2017   GLUCOSE 337 (H) 11/03/2017   CHOL 120 07/03/2017   TRIG 123.0 07/03/2017   HDL 30.30 (L) 07/03/2017   LDLDIRECT 80.6 06/26/2010   LDLCALC 65 07/03/2017   ALT 6 05/29/2016   AST 12 05/29/2016   NA 140 11/03/2017   K 4.5 11/03/2017   CL 110 11/03/2017   CREATININE 2.30 (H) 11/03/2017   BUN 29 (H) 11/03/2017   CO2 24 11/03/2017   TSH 1.42 11/03/2017   PSA 0.03 (L) 06/29/2009   INR 1.1 04/08/2008   HGBA1C 11.2 (A) 01/26/2018   MICROALBUR 2.2 (H) 07/03/2017    Ct Head Wo Contrast  Result Date: 11/04/2017 CLINICAL DATA:  Acute headache, worst headache of life, right-sided, prior subdural hematoma years ago EXAM: CT HEAD WITHOUT CONTRAST TECHNIQUE: Contiguous axial images were obtained from the base of the skull through the vertex without intravenous contrast. COMPARISON:  CT brain scan of 08/29/2016 FINDINGS: Brain: The ventricular system remains prominent as are the cortical sulci indicative of diffuse atrophy. The septum is midline in position. Mild small vessel ischemic change remains  throughout the periventricular white matter. No hemorrhage, mass lesion, or acute infarction is seen. Vascular: No vascular abnormality is evident on this unenhanced study. Skull: Changes of right parietal craniotomy again are noted. No complicating features are seen. Sinuses/Orbits: The paranasal sinuses appear well pneumatized. Other: None. IMPRESSION: 1. No change in atrophy and mild small vessel ischemic change in the periventricular white matter. 2. Stable right parietal craniotomy. Electronically Signed   By: Ivar Drape M.D.   On: 11/04/2017 10:24    Assessment & Plan:   Wyeth was seen today for hypertension and diabetes.  Diagnoses and all orders for this visit:  Painful diabetic  neuropathy ([place])  DM (diabetes mellitus), type 2, uncontrolled, periph vascular complic ([place])- His Y2Y is up to 11.1% and his blood sugar is just over 500.  It sounds like he is not using the Novolin 70/30.  I gave him samples today of Tresiba and Novolin insulin to see if he would tolerate this insulin regimen better than the 70/30.  I have also asked him to follow-up with endocrinology, diabetic education and [facility] care management. -     Ambulatory referral to Endocrinology -     Amb Referral to Nutrition and Diabetic E -     Consult to [facility] Management -     POCT glycosylated hemoglobin (Hb A1C) -     POCT Glucose (Device for Home Use) -     Insulin Pen Needle (NOVOFINE) 32G X 6 MM MISC; 1 Act by Does not apply route 4 (four) times daily - after meals and at bedtime. -     Blood Glucose Monitoring Suppl (ACCU-CHEK GUIDE ME) w/Device KIT; 1 Act by Does not apply route 4 (four) times daily - after meals and at bedtime. -     glucose blood (ACCU-CHEK GUIDE) test strip; Use QID -     Basic metabolic panel; Future  LVH (left ventricular hypertrophy) due to hypertensive disease, with heart failure ([place])- He has a normal volume status and his blood pressure is adequately well controlled.  Diastolic congestive  heart failure, NYHA class 1, unspecified congestive heart failure chronicity- He has a normal volume status and his blood pressure is adequately well controlled.  ([place])   I have discontinued Rishard Pinkett's B-D INS SYR ULTRAFINE 1CC/30G and glucose blood. I am also having him start on Insulin Pen Needle, ACCU-CHEK GUIDE ME, and glucose blood. Additionally, I am having him maintain his nitroGLYCERIN, acetaminophen, atorvastatin, telmisartan, hydrochlorothiazide, omeprazole, metoprolol tartrate, allopurinol, isosorbide mononitrate, and ONETOUCH DELICA PLUS [id number].  Meds ordered this encounter  Medications  . Insulin Pen Needle (NOVOFINE) 32G X 6 MM MISC    Sig: 1 Act by Does not apply route 4 (four) times daily - after meals and at bedtime.    Dispense:  400 each    Refill:  1  . Blood Glucose Monitoring Suppl (ACCU-CHEK GUIDE ME) w/Device KIT    Sig: 1 Act by Does not apply route 4 (four) times daily - after meals and at bedtime.    Dispense:  2 kit    Refill:  0  . glucose blood (ACCU-CHEK GUIDE) test strip    Sig: Use QID    Dispense:  400 each    Refill:  1     Follow-up: Return in about 2 months (around 03/27/2018).  [name], MD

## 2018-01-26 NOTE — Progress Notes (Unsigned)
Subjective:  Patient ID: Robert Clayton, male    DOB: [birth year]  Age: 85 y.o. MRN: [id number]  CC: No chief complaint on file.   HPI Ned Kakar presents for ***  Outpatient Medications Prior to Visit  Medication Sig Dispense Refill  . acetaminophen (TYLENOL) 325 MG tablet Take 650 mg by mouth every 6 (six) hours as needed.    [name] allopurinol (ZYLOPRIM) 300 MG tablet TAKE 1 TABLET (300 MG TOTAL) BY MOUTH DAILY. 90 tablet 1  . atorvastatin (LIPITOR) 10 MG tablet Take 1 tablet (10 mg total) by mouth daily. 90 tablet 3  . B-D INS SYR ULTRAFINE 1CC/30G 30G X 1/2" 1 ML MISC USE AS DIRECTED TWICE A DAY 200 each 3  . glucose blood (COOL BLOOD GLUCOSE TEST STRIPS) test strip Use to test blood sugar bid. DX E11.51 200 each 3  . hydrochlorothiazide (HYDRODIURIL) 12.5 MG tablet TAKE 1 TABLET BY MOUTH EVERY DAY 90 tablet 1  . isosorbide mononitrate (IMDUR) 30 MG 24 hr tablet Take 1 tablet (30 mg total) by mouth daily. 90 tablet 1  . Lancets (ONETOUCH DELICA PLUS [id number]) MISC TEST 2 TIMES DAILY. DX: E11.51 100 each 0  . metoprolol tartrate (LOPRESSOR) 50 MG tablet TAKE 1 TABLET TWICE A DAY 180 tablet 1  . nitroGLYCERIN (NITROSTAT) 0.4 MG SL tablet Place 1 tablet (0.4 mg total) under the tongue every 5 (five) minutes as needed. x3 doses as needed for chest pain 30 tablet 0  . omeprazole (PRILOSEC) 40 MG capsule TAKE 1 CAPSULE BY MOUTH EVERY DAY 90 capsule 1  . telmisartan (MICARDIS) 80 MG tablet TAKE 1 TABLET BY MOUTH EVERY DAY 90 tablet 1  . NOVOLIN 70/30 (70-30) 100 UNIT/ML injection 95 units with breakfast, and 30 units with supper. 110 mL 3   No facility-administered medications prior to visit.     ROS Review of Systems  Objective:  There were no vitals taken for this visit.  BP Readings from Last 3 Encounters:  01/26/18 (!) 144/80  12/16/17 140/70  12/09/17 140/74    Wt Readings from Last 3 Encounters:  01/26/18 199 lb (90.3 kg)  12/16/17 203 lb (92.1 kg)  12/09/17 202 lb (91.6  kg)    Physical Exam  Lab Results  Component Value Date   WBC 8.1 11/03/2017   HGB 12.7 (L) 11/03/2017   HCT 38.9 (L) 11/03/2017   PLT 101.0 (L) 11/03/2017   GLUCOSE 337 (H) 11/03/2017   CHOL 120 07/03/2017   TRIG 123.0 07/03/2017   HDL 30.30 (L) 07/03/2017   LDLDIRECT 80.6 06/26/2010   LDLCALC 65 07/03/2017   ALT 6 05/29/2016   AST 12 05/29/2016   NA 140 11/03/2017   K 4.5 11/03/2017   CL 110 11/03/2017   CREATININE 2.30 (H) 11/03/2017   BUN 29 (H) 11/03/2017   CO2 24 11/03/2017   TSH 1.42 11/03/2017   PSA 0.03 (L) 06/29/2009   INR 1.1 04/08/2008   HGBA1C 11.2 (A) 01/26/2018   MICROALBUR 2.2 (H) 07/03/2017    Ct Head Wo Contrast  Result Date: 11/04/2017 CLINICAL DATA:  Acute headache, worst headache of life, right-sided, prior subdural hematoma years ago EXAM: CT HEAD WITHOUT CONTRAST TECHNIQUE: Contiguous axial images were obtained from the base of the skull through the vertex without intravenous contrast. COMPARISON:  CT brain scan of 08/29/2016 FINDINGS: Brain: The ventricular system remains prominent as are the cortical sulci indicative of diffuse atrophy. The septum is midline in position. Mild small vessel ischemic change remains  throughout the periventricular white matter. No hemorrhage, mass lesion, or acute infarction is seen. Vascular: No vascular abnormality is evident on this unenhanced study. Skull: Changes of right parietal craniotomy again are noted. No complicating features are seen. Sinuses/Orbits: The paranasal sinuses appear well pneumatized. Other: None. IMPRESSION: 1. No change in atrophy and mild small vessel ischemic change in the periventricular white matter. 2. Stable right parietal craniotomy. Electronically Signed   By: Ivar Drape M.D.   On: 11/04/2017 10:24    Assessment & Plan:   Diagnoses and all orders for this visit:  DM (diabetes mellitus), type 2, uncontrolled, periph vascular complic ([facility]) -     insulin degludec (TRESIBA FLEXTOUCH) 100  UNIT/ML SOPN FlexTouch Pen; Inject 0.5 mLs (50 Units total) into the skin daily. -     insulin aspart (NOVOLOG FLEXPEN) 100 UNIT/ML FlexPen; Inject 10 Units into the skin 3 (three) times daily with meals.   I have discontinued Thadeus Clevenger's NOVOLIN 70/30. I am also having him start on insulin degludec and insulin aspart. Additionally, I am having him maintain his nitroGLYCERIN, acetaminophen, B-D INS SYR ULTRAFINE 1CC/30G, glucose blood, atorvastatin, telmisartan, hydrochlorothiazide, omeprazole, metoprolol tartrate, allopurinol, isosorbide mononitrate, and ONETOUCH DELICA PLUS [id number].  Meds ordered this encounter  Medications  . insulin degludec (TRESIBA FLEXTOUCH) 100 UNIT/ML SOPN FlexTouch Pen    Sig: Inject 0.5 mLs (50 Units total) into the skin daily.    Dispense:  9 mL    Refill:  1  . insulin aspart (NOVOLOG FLEXPEN) 100 UNIT/ML FlexPen    Sig: Inject 10 Units into the skin 3 (three) times daily with meals.    Dispense:  45 mL    Refill:  1     Follow-up: No follow-ups on file.  [name], MD

## 2018-01-30 ENCOUNTER — Telehealth: Payer: Self-pay | Admitting: Licensed Clinical Social Worker

## 2018-01-30 NOTE — Telephone Encounter (Signed)
Palliative SW left a vm with patient to request a home visit.

## 2018-02-09 ENCOUNTER — Other Ambulatory Visit: Payer: Self-pay | Admitting: Internal Medicine

## 2018-02-09 ENCOUNTER — Ambulatory Visit (INDEPENDENT_AMBULATORY_CARE_PROVIDER_SITE_OTHER): Payer: Medicare Other

## 2018-02-09 DIAGNOSIS — E538 Deficiency of other specified B group vitamins: Secondary | ICD-10-CM

## 2018-02-09 DIAGNOSIS — IMO0002 Reserved for concepts with insufficient information to code with codable children: Secondary | ICD-10-CM

## 2018-02-09 DIAGNOSIS — I129 Hypertensive chronic kidney disease with stage 1 through stage 4 chronic kidney disease, or unspecified chronic kidney disease: Secondary | ICD-10-CM

## 2018-02-09 DIAGNOSIS — E1165 Type 2 diabetes mellitus with hyperglycemia: Principal | ICD-10-CM

## 2018-02-09 DIAGNOSIS — E1151 Type 2 diabetes mellitus with diabetic peripheral angiopathy without gangrene: Secondary | ICD-10-CM

## 2018-02-09 MED ORDER — CYANOCOBALAMIN 1000 MCG/ML IJ SOLN
1000.0000 ug | Freq: Once | INTRAMUSCULAR | Status: AC
  Administered 2018-02-09: 1000 ug via INTRAMUSCULAR

## 2018-02-09 NOTE — Progress Notes (Signed)
I have reviewed and agree.

## 2018-02-10 ENCOUNTER — Other Ambulatory Visit: Payer: Self-pay

## 2018-02-10 ENCOUNTER — Encounter: Payer: Self-pay | Admitting: *Deleted

## 2018-02-10 NOTE — Patient Outreach (Signed)
[place] [facility]) Care Management  02/10/2018  Robert Clayton 03/08/1930 [id number]  TELEPHONE SCREENING Referral date: 01/26/18 Referral source: primary MD  Referral reason: diabetes with peripheral vascular complications Insurance:  medicare  Telephone call to patient regarding primary MD referral. HIPAA verified with patient. Explained reason for call. RNCM discussed and offered [facility] care management services. Patient verbally agreed.  Patient states he has been diabetic for approximately 20 years.  He states the last time he had diabetic education was approximately 20 years ago.  He states he is checking his blood sugars daily/ fasting. Patient reports his blood sugar ranges from 110 to 266.  Patient states his most recent A 1c is 12.  Patient states his doctor recently prescribed him a new insulin pin. Patient unable to state name of insulin pin.  He states the first night he used it it gave him cramps. Patient states he is not using it and has restarted his regular insulin medication.  Patient states he has not reported this medication reaction to his doctor. Patient states his next follow up appointment with his primary MD is 01/26/18.  He states he will tell his doctor at that time. RNCM advised patient to call his doctors office and report the reaction he had to the new insulin pin.  RNCM also offered follow up with [facility] care management pharmacist. Patient declined.   Patient states he agrees to having follow up with [facility] care management health coach.  RNCM provided name and contact phone number to [facility] care management.   PLAN: RNCm will refer patient to health coach for diabetic education/ management  Quinn Plowman RN,BSN,CCM [facility] Telephonic  [phone]

## 2018-02-16 ENCOUNTER — Other Ambulatory Visit: Payer: Self-pay | Admitting: Internal Medicine

## 2018-02-24 ENCOUNTER — Other Ambulatory Visit: Payer: Self-pay | Admitting: *Deleted

## 2018-02-24 NOTE — Patient Outreach (Signed)
[place] [facility]) Care Management  02/24/2018  Robert Robert October 02, 1930 [id number]   RN Health Coach attempted follow up outreach call to patient.  Patient was unavailable. HIPPA compliancel message left with wife for return callback number.  Plan: RN will call patient again within 30 days.  [place] Care Management [phone]

## 2018-03-03 ENCOUNTER — Telehealth: Payer: Self-pay | Admitting: Licensed Clinical Social Worker

## 2018-03-03 NOTE — Telephone Encounter (Signed)
SW spoke with patient's wife, Plassie, requesting a home visit.  She declined a visit for the next two weeks due to having company at their home.  SW to follow up.

## 2018-03-10 ENCOUNTER — Ambulatory Visit (INDEPENDENT_AMBULATORY_CARE_PROVIDER_SITE_OTHER): Payer: Medicare Other

## 2018-03-10 DIAGNOSIS — E538 Deficiency of other specified B group vitamins: Secondary | ICD-10-CM | POA: Diagnosis not present

## 2018-03-10 MED ORDER — CYANOCOBALAMIN 1000 MCG/ML IJ SOLN
1000.0000 ug | Freq: Once | INTRAMUSCULAR | Status: AC
  Administered 2018-03-10: 1000 ug via INTRAMUSCULAR

## 2018-03-10 NOTE — Progress Notes (Signed)
I have reviewed and agree.

## 2018-03-11 ENCOUNTER — Ambulatory Visit: Payer: Self-pay | Admitting: *Deleted

## 2018-03-16 ENCOUNTER — Ambulatory Visit: Payer: Self-pay | Admitting: *Deleted

## 2018-03-20 ENCOUNTER — Encounter: Payer: Self-pay | Admitting: Internal Medicine

## 2018-03-23 ENCOUNTER — Other Ambulatory Visit: Payer: Self-pay | Admitting: Internal Medicine

## 2018-03-30 ENCOUNTER — Encounter: Payer: Self-pay | Admitting: Internal Medicine

## 2018-03-30 ENCOUNTER — Ambulatory Visit (INDEPENDENT_AMBULATORY_CARE_PROVIDER_SITE_OTHER): Payer: Medicare Other | Admitting: Internal Medicine

## 2018-03-30 ENCOUNTER — Other Ambulatory Visit (INDEPENDENT_AMBULATORY_CARE_PROVIDER_SITE_OTHER): Payer: Medicare Other

## 2018-03-30 ENCOUNTER — Other Ambulatory Visit: Payer: Self-pay

## 2018-03-30 VITALS — BP 180/80 | HR 77 | Temp 97.9°F | Resp 16 | Ht 72.0 in | Wt 212.0 lb

## 2018-03-30 DIAGNOSIS — R252 Cramp and spasm: Secondary | ICD-10-CM | POA: Diagnosis not present

## 2018-03-30 DIAGNOSIS — IMO0002 Reserved for concepts with insufficient information to code with codable children: Secondary | ICD-10-CM

## 2018-03-30 DIAGNOSIS — N183 Chronic kidney disease, stage 3 unspecified: Secondary | ICD-10-CM

## 2018-03-30 DIAGNOSIS — E1151 Type 2 diabetes mellitus with diabetic peripheral angiopathy without gangrene: Secondary | ICD-10-CM

## 2018-03-30 DIAGNOSIS — I129 Hypertensive chronic kidney disease with stage 1 through stage 4 chronic kidney disease, or unspecified chronic kidney disease: Secondary | ICD-10-CM

## 2018-03-30 DIAGNOSIS — D518 Other vitamin B12 deficiency anemias: Secondary | ICD-10-CM

## 2018-03-30 DIAGNOSIS — E1165 Type 2 diabetes mellitus with hyperglycemia: Secondary | ICD-10-CM | POA: Diagnosis not present

## 2018-03-30 DIAGNOSIS — I10 Essential (primary) hypertension: Secondary | ICD-10-CM

## 2018-03-30 LAB — CBC WITH DIFFERENTIAL/PLATELET
Basophils Absolute: 0 10*3/uL (ref 0.0–0.1)
Basophils Relative: 0.5 % (ref 0.0–3.0)
Eosinophils Absolute: 0.1 10*3/uL (ref 0.0–0.7)
Eosinophils Relative: 1.6 % (ref 0.0–5.0)
HCT: 37.4 % — ABNORMAL LOW (ref 39.0–52.0)
Hemoglobin: 12.3 g/dL — ABNORMAL LOW (ref 13.0–17.0)
Lymphocytes Relative: 26 % (ref 12.0–46.0)
Lymphs Abs: 2.2 10*3/uL (ref 0.7–4.0)
MCHC: 32.8 g/dL (ref 30.0–36.0)
MCV: 95.7 fl (ref 78.0–100.0)
Monocytes Absolute: 0.8 10*3/uL (ref 0.1–1.0)
Monocytes Relative: 9.1 % (ref 3.0–12.0)
NEUTROS ABS: 5.4 10*3/uL (ref 1.4–7.7)
Neutrophils Relative %: 62.8 % (ref 43.0–77.0)
Platelets: 106 10*3/uL — ABNORMAL LOW (ref 150.0–400.0)
RBC: 3.91 Mil/uL — ABNORMAL LOW (ref 4.22–5.81)
RDW: 17 % — AB (ref 11.5–15.5)
WBC: 8.5 10*3/uL (ref 4.0–10.5)

## 2018-03-30 LAB — COMPREHENSIVE METABOLIC PANEL
ALT: 13 U/L (ref 0–53)
AST: 16 U/L (ref 0–37)
Albumin: 3.9 g/dL (ref 3.5–5.2)
Alkaline Phosphatase: 86 U/L (ref 39–117)
BUN: 32 mg/dL — ABNORMAL HIGH (ref 6–23)
CO2: 25 mEq/L (ref 19–32)
Calcium: 9.4 mg/dL (ref 8.4–10.5)
Chloride: 112 mEq/L (ref 96–112)
Creatinine, Ser: 2.25 mg/dL — ABNORMAL HIGH (ref 0.40–1.50)
GFR: 33.69 mL/min — ABNORMAL LOW (ref >60.00)
Glucose, Bld: 71 mg/dL (ref 70–99)
Potassium: 4.7 mEq/L (ref 3.5–5.1)
Sodium: 144 mEq/L (ref 135–145)
Total Bilirubin: 0.4 mg/dL (ref 0.2–1.2)
Total Protein: 6.7 g/dL (ref 6.0–8.3)

## 2018-03-30 LAB — HEMOGLOBIN A1C: HEMOGLOBIN A1C: 10 % — AB (ref 4.6–6.5)

## 2018-03-30 LAB — CK: Total CK: 186 U/L (ref 7–232)

## 2018-03-30 MED ORDER — HYDROCHLOROTHIAZIDE 12.5 MG PO TABS: 12.5000 mg | ORAL_TABLET | Freq: Every day | ORAL | 1 refills | Status: DC

## 2018-03-30 NOTE — Patient Instructions (Signed)

## 2018-03-30 NOTE — Progress Notes (Signed)
Subjective:  Patient ID: Robert Clayton, male    DOB: 27-Jun-1930  Age: 85 y.o. MRN: [id number]  CC: Hypertension; Anemia; and Hyperlipidemia   HPI Robert Clayton presents for f/up - He tells me he is taking all of his antihypertensives but according to his prescription refills he would have run out of hydrochlorothiazide several months ago.  He also complains of muscle cramps.  He thought that this was caused by the short acting insulin he was using.  Outpatient Medications Prior to Visit  Medication Sig Dispense Refill  . acetaminophen (TYLENOL) 325 MG tablet Take 650 mg by mouth every 6 (six) hours as needed.    [name] allopurinol (ZYLOPRIM) 300 MG tablet TAKE 1 TABLET (300 MG TOTAL) BY MOUTH DAILY. 90 tablet 1  . B-D INS SYR ULTRAFINE 1CC/30G 30G X 1/2" 1 ML MISC USE AS DIRECTED TWICE A DAY 200 each 3  . Blood Glucose Monitoring Suppl (ACCU-CHEK GUIDE ME) w/Device KIT 1 Act by Does not apply route 4 (four) times daily - after meals and at bedtime. 2 kit 0  . glucose blood (ACCU-CHEK GUIDE) test strip Use QID 400 each 1  . insulin aspart (NOVOLOG FLEXPEN) 100 UNIT/ML FlexPen Inject 10 Units into the skin 3 (three) times daily with meals. 45 mL 1  . insulin degludec (TRESIBA FLEXTOUCH) 100 UNIT/ML SOPN FlexTouch Pen Inject 0.5 mLs (50 Units total) into the skin daily. 9 mL 1  . Insulin Pen Needle (NOVOFINE) 32G X 6 MM MISC 1 Act by Does not apply route 4 (four) times daily - after meals and at bedtime. 400 each 1  . isosorbide mononitrate (IMDUR) 30 MG 24 hr tablet Take 1 tablet (30 mg total) by mouth daily. 90 tablet 1  . Lancets (ONETOUCH DELICA PLUS [id number]) MISC TEST 2 TIMES DAILY. DX: E11.51 100 each 0  . metoprolol tartrate (LOPRESSOR) 50 MG tablet TAKE 1 TABLET TWICE A DAY 180 tablet 1  . omeprazole (PRILOSEC) 40 MG capsule TAKE 1 CAPSULE BY MOUTH EVERY DAY 90 capsule 1  . telmisartan (MICARDIS) 80 MG tablet TAKE 1 TABLET BY MOUTH EVERY DAY 90 tablet 1  . atorvastatin (LIPITOR) 10 MG  tablet Take 1 tablet (10 mg total) by mouth daily. 90 tablet 3  . hydrochlorothiazide (HYDRODIURIL) 12.5 MG tablet TAKE 1 TABLET BY MOUTH EVERY DAY 90 tablet 1  . nitroGLYCERIN (NITROSTAT) 0.4 MG SL tablet Place 1 tablet (0.4 mg total) under the tongue every 5 (five) minutes as needed. x3 doses as needed for chest pain (Patient not taking: Reported on 03/30/2018) 30 tablet 0   No facility-administered medications prior to visit.     ROS Review of Systems  Constitutional: Negative for diaphoresis and fatigue.  HENT: Negative.  Negative for trouble swallowing.   Eyes: Negative for visual disturbance.  Respiratory: Negative for cough, chest tightness, shortness of breath and wheezing.   Cardiovascular: Negative for chest pain, palpitations and leg swelling.  Gastrointestinal: Negative for abdominal pain, constipation, diarrhea, nausea and vomiting.  Endocrine: Negative.   Genitourinary: Negative.  Negative for difficulty urinating and dysuria.  Musculoskeletal: Positive for myalgias. Negative for arthralgias and neck pain.  Skin: Negative.  Negative for color change and pallor.  Neurological: Negative.  Negative for dizziness, weakness, light-headedness and numbness.  Hematological: Negative for adenopathy. Does not bruise/bleed easily.  Psychiatric/Behavioral: Negative.     Objective:  BP (!) 180/80 (BP Location: Left Arm, Patient Position: Sitting, Cuff Size: Normal)   Pulse 77   Temp 97.9  F (36.6 C) (Oral)   Resp 16   Ht 6' (1.829 m)   Wt 212 lb (96.2 kg)   SpO2 97%   BMI 28.75 kg/m   BP Readings from Last 3 Encounters:  03/30/18 (!) 180/80  01/26/18 (!) 144/80  12/16/17 140/70    Wt Readings from Last 3 Encounters:  03/30/18 212 lb (96.2 kg)  01/26/18 199 lb (90.3 kg)  12/16/17 203 lb (92.1 kg)    Physical Exam Vitals signs reviewed.  Constitutional:      Appearance: He is not ill-appearing or diaphoretic.  HENT:     Nose: Nose normal. No congestion or  rhinorrhea.     Mouth/Throat:     Mouth: Mucous membranes are moist.     Pharynx: Oropharynx is clear. No oropharyngeal exudate or posterior oropharyngeal erythema.  Eyes:     General: No scleral icterus.    Conjunctiva/sclera: Conjunctivae normal.  Neck:     Musculoskeletal: Normal range of motion. No muscular tenderness.  Cardiovascular:     Rate and Rhythm: Normal rate and regular rhythm.     Heart sounds: No murmur. No gallop.   Pulmonary:     Effort: Pulmonary effort is normal.     Breath sounds: No stridor. No wheezing, rhonchi or rales.  Abdominal:     General: Bowel sounds are normal.     Palpations: There is no hepatomegaly, splenomegaly or mass.     Tenderness: There is no abdominal tenderness. There is no guarding.  Musculoskeletal: Normal range of motion.        General: No swelling.     Right lower leg: No edema.     Left lower leg: No edema.  Lymphadenopathy:     Cervical: No cervical adenopathy.  Skin:    General: Skin is warm and dry.     Coloration: Skin is not pale.  Neurological:     General: No focal deficit present.  Psychiatric:        Mood and Affect: Mood normal.     Lab Results  Component Value Date   WBC 8.5 03/30/2018   HGB 12.3 (L) 03/30/2018   HCT 37.4 (L) 03/30/2018   PLT 106.0 (L) 03/30/2018   GLUCOSE 71 03/30/2018   CHOL 120 07/03/2017   TRIG 123.0 07/03/2017   HDL 30.30 (L) 07/03/2017   LDLDIRECT 80.6 06/26/2010   LDLCALC 65 07/03/2017   ALT 13 03/30/2018   AST 16 03/30/2018   NA 144 03/30/2018   K 4.7 03/30/2018   CL 112 03/30/2018   CREATININE 2.25 (H) 03/30/2018   BUN 32 (H) 03/30/2018   CO2 25 03/30/2018   TSH 1.42 11/03/2017   PSA 0.03 (L) 06/29/2009   INR 1.1 04/08/2008   HGBA1C 10.0 (H) 03/30/2018   MICROALBUR 2.2 (H) 07/03/2017    Ct Head Wo Contrast  Result Date: 11/04/2017 CLINICAL DATA:  Acute headache, worst headache of life, right-sided, prior subdural hematoma years ago EXAM: CT HEAD WITHOUT CONTRAST  TECHNIQUE: Contiguous axial images were obtained from the base of the skull through the vertex without intravenous contrast. COMPARISON:  CT brain scan of 08/29/2016 FINDINGS: Brain: The ventricular system remains prominent as are the cortical sulci indicative of diffuse atrophy. The septum is midline in position. Mild small vessel ischemic change remains throughout the periventricular white matter. No hemorrhage, mass lesion, or acute infarction is seen. Vascular: No vascular abnormality is evident on this unenhanced study. Skull: Changes of right parietal craniotomy again are noted. No complicating  features are seen. Sinuses/Orbits: The paranasal sinuses appear well pneumatized. Other: None. IMPRESSION: 1. No change in atrophy and mild small vessel ischemic change in the periventricular white matter. 2. Stable right parietal craniotomy. Electronically Signed   By: Ivar Drape M.D.   On: 11/04/2017 10:24    Assessment & Plan:   Travers was seen today for hypertension, anemia and hyperlipidemia.  Diagnoses and all orders for this visit:  ANEMIA, B12 DEFICIENCY-his H&H are stable.  We will continue monthly parenteral B12 replacement therapy. -     CBC with Differential/Platelet; Future  DM (diabetes mellitus), type 2, uncontrolled, periph vascular complic ([place])- His blood sugars are still too high.  I have advised him that the short acting insulin is not causing muscle cramps and have asked her to restart it. -     Comprehensive metabolic panel; Future -     Hemoglobin A1c; Future  Essential hypertension- His blood pressure is not adequately well controlled.  I have asked him to restart the hydrochlorothiazide. -     Comprehensive metabolic panel; Future  Bilateral leg cramps- His CK level is in the normal range.  I think his symptoms are caused by the statin so I have asked him to stop taking it. -     Comprehensive metabolic panel; Future -     CK; Future  Hypertensive renal disease -      hydrochlorothiazide (HYDRODIURIL) 12.5 MG tablet; Take 1 tablet (12.5 mg total) by mouth daily.  CRI (chronic renal insufficiency), stage 3 (moderate) (HCC)- His renal function is stable.  He will avoid nephrotoxic agents.   I have discontinued Rudolph Adel's atorvastatin. I have also changed his hydrochlorothiazide. Additionally, I am having him maintain his nitroGLYCERIN, acetaminophen, metoprolol tartrate, isosorbide mononitrate, OneTouch Delica Plus [id number], insulin degludec, insulin aspart, Insulin Pen Needle, Accu-Chek Guide Me, glucose blood, telmisartan, omeprazole, allopurinol, and B-D INS SYR ULTRAFINE 1CC/30G.  Meds ordered this encounter  Medications  . hydrochlorothiazide (HYDRODIURIL) 12.5 MG tablet    Sig: Take 1 tablet (12.5 mg total) by mouth daily.    Dispense:  90 tablet    Refill:  1     Follow-up: Return in about 3 months (around 06/30/2018).  [name], MD

## 2018-04-06 ENCOUNTER — Telehealth: Payer: Self-pay | Admitting: Licensed Clinical Social Worker

## 2018-04-06 ENCOUNTER — Telehealth: Payer: Self-pay | Admitting: *Deleted

## 2018-04-06 ENCOUNTER — Other Ambulatory Visit: Payer: Self-pay | Admitting: *Deleted

## 2018-04-06 NOTE — Telephone Encounter (Signed)
Called patient and LVM to inform them the nurse needs to either convert their upcoming AWV to a virtual visit or reschedule the visit out into the future due to covid-19 safety measures. AWV rescheduled for 06/30/18.

## 2018-04-06 NOTE — Patient Outreach (Signed)
[place] [facility]) Care Management  04/06/2018  Deaken Jurgens 1930/11/14 [id number]   RN Health Coach attempted follow up outreach call to patient.  Patient was unavailable.RN spoke with the wife and stated that he was real busy and not available right now. RN asked best time to call back and she stated that she didn't know.  Plan: RN will call patient again within 30 days.  [facility] Care Management [phone]

## 2018-04-06 NOTE — Telephone Encounter (Signed)
Palliative Care SW phoned patient's home twice.  It sounded as if patient's wife, Plassie, answered the phone and said hello, then hung up.  Plan is for SW to contact patient again.

## 2018-04-10 ENCOUNTER — Ambulatory Visit: Payer: Medicare Other

## 2018-04-28 ENCOUNTER — Ambulatory Visit: Payer: Medicare Other

## 2018-05-04 ENCOUNTER — Other Ambulatory Visit: Payer: Self-pay

## 2018-05-04 ENCOUNTER — Other Ambulatory Visit: Payer: Medicare Other | Admitting: Licensed Clinical Social Worker

## 2018-05-04 DIAGNOSIS — Z515 Encounter for palliative care: Secondary | ICD-10-CM

## 2018-05-04 NOTE — Progress Notes (Signed)
COMMUNITY PALLIATIVE CARE SW NOTE  PATIENT NAME: Robert Clayton DOB: 07/16/1930 MRN: [id number]  PRIMARY CARE PROVIDER: Janith Lima, MD  RESPONSIBLE PARTY:  Acct ID - Guarantor Home Phone Work Phone Relationship Acct Type  [id number] Inetta Fermo [phone]  Self P/F     [place], [place], [place] [address]   Due to the COVID-19 crisis, this virtual check-in visit was done via telephone from my office and it was initiated and consent given by this patient and or family.   PLAN OF CARE and INTERVENTIONS:             1. GOALS OF CARE/ ADVANCE CARE PLANNING:  Goal is for patient to remain at home with his wife, Plassie.  He is a full code. 2. SOCIAL/EMOTIONAL/SPIRITUAL ASSESSMENT/ INTERVENTIONS:  SW conducted a virtual check-in visit with patient's wife.  Patient was still sleeping and could not participate.  SW provided active listening and supportive counseling while Plassie discussed patient's current condition.  He has not had any recent falls.  He remains as independent and self reliant as possible.  Plassie had not concerns at this time. 3. PATIENT/CAREGIVER EDUCATION/ COPING:  SW provided education regarding the virtual check-in visit and she stated she understood. 4. PERSONAL EMERGENCY PLAN:  Family will contact EMS as needed. 5. COMMUNITY RESOURCES COORDINATION/ HEALTH CARE NAVIGATION:  None. 6. FINANCIAL/LEGAL CONCERNS/INTERVENTIONS:  None.     SOCIAL HX:  Social History   Tobacco Use  . Smoking status: Never Smoker  . Smokeless tobacco: Never Used  Substance Use Topics  . Alcohol use: No    CODE STATUS:  Full Code  ADVANCED DIRECTIVES: N MOST FORM COMPLETE:  N HOSPICE EDUCATION PROVIDED:  N PPS:  Wife reports his appetite is normal.  He continues to ambulate independently. Duration of visit and documentation:  30 minutes.      Creola Corn Annibelle Brazie, LCSW

## 2018-05-07 ENCOUNTER — Other Ambulatory Visit: Payer: Self-pay | Admitting: Internal Medicine

## 2018-05-07 ENCOUNTER — Other Ambulatory Visit: Payer: Self-pay | Admitting: *Deleted

## 2018-05-07 DIAGNOSIS — I25110 Atherosclerotic heart disease of native coronary artery with unstable angina pectoris: Secondary | ICD-10-CM

## 2018-05-07 NOTE — Patient Outreach (Signed)
[place] [facility]) Care Management  05/07/2018  Robert Clayton 1930-10-06 [id number]  RN Health Coach attempted follow up outreach call to patient.  Patient was unavailable. HIPPA compliance voicemail message left with return callback number.  Plan: RN will call patient again within 30 days.  [place] Care Management [phone]

## 2018-05-21 ENCOUNTER — Other Ambulatory Visit: Payer: Self-pay | Admitting: Endocrinology

## 2018-05-28 ENCOUNTER — Other Ambulatory Visit: Payer: Medicare Other | Admitting: Licensed Clinical Social Worker

## 2018-05-28 ENCOUNTER — Other Ambulatory Visit: Payer: Self-pay

## 2018-05-28 ENCOUNTER — Encounter: Payer: Self-pay | Admitting: *Deleted

## 2018-05-28 ENCOUNTER — Other Ambulatory Visit: Payer: Self-pay | Admitting: *Deleted

## 2018-05-28 DIAGNOSIS — Z515 Encounter for palliative care: Secondary | ICD-10-CM

## 2018-05-28 NOTE — Patient Outreach (Addendum)
[facility] [place] [facility]) Care Management  05/28/2018   Robert Clayton 01-Feb-1930 [id number]  RN Health Coach telephone call to patient.  Hipaa compliance verified. Per patient wife he is has not checked his blood sugar. Patient is taking his medications as prescribed. Patient ambulates with the cane sometimes. A1C is 10.0. Patient eats a regular diet trying to decrease sugar intake. Patient wife has agreed to follow up outreach calls.    Current Medications:  Current Outpatient Medications  Medication Sig Dispense Refill  . acetaminophen (TYLENOL) 325 MG tablet Take 650 mg by mouth every 6 (six) hours as needed.    [name] allopurinol (ZYLOPRIM) 300 MG tablet TAKE 1 TABLET (300 MG TOTAL) BY MOUTH DAILY. 90 tablet 1  . B-D INS SYR ULTRAFINE 1CC/30G 30G X 1/2" 1 ML MISC USE AS DIRECTED TWICE A DAY 200 each 3  . Blood Glucose Monitoring Suppl (ACCU-CHEK GUIDE ME) w/Device KIT 1 Act by Does not apply route 4 (four) times daily - after meals and at bedtime. 2 kit 0  . glucose blood (ACCU-CHEK GUIDE) test strip Use QID 400 each 1  . hydrochlorothiazide (HYDRODIURIL) 12.5 MG tablet Take 1 tablet (12.5 mg total) by mouth daily. 90 tablet 1  . insulin aspart (NOVOLOG FLEXPEN) 100 UNIT/ML FlexPen Inject 10 Units into the skin 3 (three) times daily with meals. 45 mL 1  . insulin degludec (TRESIBA FLEXTOUCH) 100 UNIT/ML SOPN FlexTouch Pen Inject 0.5 mLs (50 Units total) into the skin daily. 9 mL 1  . Insulin Pen Needle (NOVOFINE) 32G X 6 MM MISC 1 Act by Does not apply route 4 (four) times daily - after meals and at bedtime. 400 each 1  . isosorbide mononitrate (IMDUR) 30 MG 24 hr tablet TAKE 1 TABLET BY MOUTH EVERY DAY 90 tablet 1  . Lancets (ONETOUCH DELICA PLUS [id number]) MISC TEST 2 TIMES DAILY. DX: E11.51 100 each 0  . metoprolol tartrate (LOPRESSOR) 50 MG tablet TAKE 1 TABLET TWICE A DAY 180 tablet 1  . nitroGLYCERIN (NITROSTAT) 0.4 MG SL tablet Place 1 tablet (0.4 mg total) under the tongue every  5 (five) minutes as needed. x3 doses as needed for chest pain (Patient not taking: Reported on 03/30/2018) 30 tablet 0  . omeprazole (PRILOSEC) 40 MG capsule TAKE 1 CAPSULE BY MOUTH EVERY DAY 90 capsule 1  . telmisartan (MICARDIS) 80 MG tablet TAKE 1 TABLET BY MOUTH EVERY DAY 90 tablet 1   No current facility-administered medications for this visit.     Functional Status:  In your present state of health, do you have any difficulty performing the following activities: 05/28/2018  Hearing? N  Vision? N  Difficulty concentrating or making decisions? N  Walking or climbing stairs? Y  Comment patient uses a Quarry manager and eating ? N  Using the Toilet? N  In the past six months, have you accidently leaked urine? N  Do you have problems with loss of bowel control? N  Managing your Medications? N  Managing your Finances? N  Housekeeping or managing your Housekeeping? Y  Comment wife takes care of home  Some recent data might be hidden    Fall/Depression Screening: Fall Risk  05/28/2018 04/24/2017 04/15/2016  Falls in the past year? 0 Yes No  Number falls in past yr: - 1 -  Injury with Fall? - Yes -  Risk for fall due to : - - -  Follow up - Education provided;Falls prevention discussed -   PHQ 2/9 Scores  05/28/2018 02/10/2018 04/29/2017 04/24/2017 04/15/2016 02/23/2015 03/23/2014  PHQ - 2 Score 0 0 0 1 0 0 0  PHQ- 9 Score - - - 4 - - -   THN CM Care Plan Problem One     Most Recent Value  Care Plan Problem One  Knowledge Deficit in Self Management of Diabtes  Role Documenting the Problem One  [place] for Problem One  Active  THN Long Term Goal   Patient will see a decrease in A1C from 10.0 within the next 90 days  THN Long Term Goal Start Date  05/28/18  Interventions for Problem One Long Term Goal  RN discussed eating healthy.RN sent living well with diabetes. RN will follow up within further discussion  THN CM Short Term Goal #1   Patient will verbalize checking  blood sugars and documenting within the next 30 days  THN CM Short Term Goal #1 Start Date  05/28/18  Interventions for Short Term Goal #1  RN discussed checking blood sugars and documenting. RN sent a 2020 calendar book for documenting. RN will follow up with further discussion  THN CM Short Term Goal #2   Patient will verbalize using safety precautions during the Coronavirus pandemic within the next 30 days  THN CM Short Term Goal #2 Start Date  05/28/18  Interventions for Short Term Goal #2     RN discussed cornavirus safety precautions. RN sent patient 2 cloth respiratory masks. RN will follow up with further discussion       Assessment:  A1C 10.0 Patient has not checked blood sugar today Patient will benefit from [place] telephonic outreach for education and support for diabetes self management.  Plan:  RN discussed coronavirus pandemic safety Patient will check blood sugars daily and document Patient will continue medication adherence Referred to social worker RN sent assessment and barriers letter PCP RN will follow up within the month of August   Robert Clayton [facility] [phone]

## 2018-05-28 NOTE — Patient Outreach (Signed)
[facility]) Care Management  05/28/2018  Robert Clayton [birth year] [id number]  Unsuccessful outreach to patient regarding social work referral.  Referral stated, "RN referred to Social worker due to patient and wife stated that they would appreciate some food. They are not going out much and would take anything they could get. Patient is a diabetic."  BSW left voicemail message.  Will attempt to reach again tomorrow.  Ronn Melena, BSW Social Worker [phone]

## 2018-05-29 ENCOUNTER — Other Ambulatory Visit: Payer: Self-pay

## 2018-05-29 NOTE — Progress Notes (Signed)
COMMUNITY PALLIATIVE CARE SW NOTE  PATIENT NAME: Robert Clayton DOB: [birth year] MRN: [id number]  PRIMARY CARE PROVIDER: Janith Lima, MD  RESPONSIBLE PARTY:  Acct ID - Guarantor Home Phone Work Phone Relationship Acct Type  [id number] Inetta Fermo [phone]  Self P/F     [place], [place], [place] [address]   Due to the COVID-19 crisis, this virtual check-in visit was done via telephone from my office and it was initiated and consent given by this patient and or family.    PLAN OF CARE and INTERVENTIONS:             1. GOALS OF CARE/ ADVANCE CARE PLANNING:  Patient's goal is to remain at home with his wife, Robert Clayton.  Patient is a full code. 2. SOCIAL/EMOTIONAL/SPIRITUAL ASSESSMENT/ INTERVENTIONS:  SW conducted a virtual check-in visit with patient's wife in their home.  She stated patient does not sleep well during the night, therefore, he sleeps a lot during the day.  She feels this is a result of patient working nights at [employer] before he retired.  The couple will be celebrating their 66th wedding anniversary this year.They have a daughter that lives in [place] that helps them.  Patient still drives and they go to the grocery store in the morning.  3. PATIENT/CAREGIVER EDUCATION/ COPING:  Robert Clayton and patient express their feelings openly. 4. PERSONAL EMERGENCY PLAN:  Family will contact EMS as needed. 5. COMMUNITY RESOURCES COORDINATION/ HEALTH CARE NAVIGATION:  None. 6. FINANCIAL/LEGAL CONCERNS/INTERVENTIONS:  None.     SOCIAL HX:  Social History   Tobacco Use  . Smoking status: Never Smoker  . Smokeless tobacco: Never Used  Substance Use Topics  . Alcohol use: No    CODE STATUS:  Full Code  ADVANCED DIRECTIVES: N MOST FORM COMPLETE:  N HOSPICE EDUCATION PROVIDED:  N PPS:  Patient's appetite is normal.  Patient ambulates independently. Duration of visit and documentation:  30 minutes.      Creola Corn Luba Matzen, LCSW

## 2018-05-29 NOTE — Patient Outreach (Signed)
[facility] [facility]) Care Management  05/29/2018  Robert Clayton May 29, 1930 [id number]   Successful outreach to patient regarding social work referral.  Referral stated, "RN referred to Social worker due to patient and wife stated that they would appreciate some food. They are not going out much and would take anything they could get. Patient is a diabetic."  BSW informed patient about [place] [facility] which would allow him and his spouse to receive delivered meals until further notice.  BSW offered to submit referral for program but patient declined at this time.  BSW inquired about the need for other food resources and patient reported "we are doing alright right now".  BSW is closing case but provided patient with contact information and encouraged him to call if he changes his mind or other social work needs arise.  Ronn Melena, BSW Social Worker [phone]

## 2018-05-30 ENCOUNTER — Other Ambulatory Visit: Payer: Self-pay | Admitting: Internal Medicine

## 2018-05-30 DIAGNOSIS — E1151 Type 2 diabetes mellitus with diabetic peripheral angiopathy without gangrene: Secondary | ICD-10-CM

## 2018-05-30 DIAGNOSIS — I2510 Atherosclerotic heart disease of native coronary artery without angina pectoris: Secondary | ICD-10-CM

## 2018-05-30 DIAGNOSIS — IMO0002 Reserved for concepts with insufficient information to code with codable children: Secondary | ICD-10-CM

## 2018-05-30 DIAGNOSIS — E785 Hyperlipidemia, unspecified: Secondary | ICD-10-CM

## 2018-06-09 ENCOUNTER — Other Ambulatory Visit: Payer: Self-pay | Admitting: Internal Medicine

## 2018-06-09 DIAGNOSIS — I129 Hypertensive chronic kidney disease with stage 1 through stage 4 chronic kidney disease, or unspecified chronic kidney disease: Secondary | ICD-10-CM

## 2018-06-09 DIAGNOSIS — IMO0002 Reserved for concepts with insufficient information to code with codable children: Secondary | ICD-10-CM

## 2018-06-09 DIAGNOSIS — E1151 Type 2 diabetes mellitus with diabetic peripheral angiopathy without gangrene: Secondary | ICD-10-CM

## 2018-06-11 ENCOUNTER — Other Ambulatory Visit: Payer: Medicare Other | Admitting: Licensed Clinical Social Worker

## 2018-06-11 ENCOUNTER — Other Ambulatory Visit: Payer: Self-pay

## 2018-06-11 DIAGNOSIS — Z515 Encounter for palliative care: Secondary | ICD-10-CM

## 2018-06-11 NOTE — Progress Notes (Signed)
COMMUNITY PALLIATIVE CARE SW NOTE  PATIENT NAME: Geoff Dacanay DOB: [birth year] MRN: [id number]  PRIMARY CARE PROVIDER: Janith Lima, MD  RESPONSIBLE PARTY:  Acct ID - Guarantor Home Phone Work Phone Relationship Acct Type  [id number] Inetta Fermo [phone]  Self P/F     [place], [place], [place] [address]   Due to the COVID-19 crisis, this virtual check-in visit was done via telephone from my office and it was initiated and consent given by this patient and or family.   PLAN OF CARE and INTERVENTIONS:             1. GOALS OF CARE/ ADVANCE CARE PLANNING:  Goal is for patient to remain at home with his spouse, Plassie.  Patient remains a full code. 2. SOCIAL/EMOTIONAL/SPIRITUAL ASSESSMENT/ INTERVENTIONS:  SW conducted a virtual check-in visit with patient's wife.  Patient was currently sleeping.  She reports no changes since the last virtual check-in visit.  Patient and his wife received masks and go to the grocery store in the morning.  SW provided active listening and supportive counseling. 3. PATIENT/CAREGIVER EDUCATION/ COPING:  Plassie expresses her feelings openly. 4. PERSONAL EMERGENCY PLAN:  Family will contact EMS. 5. COMMUNITY RESOURCES COORDINATION/ HEALTH CARE NAVIGATION:  None. 6. FINANCIAL/LEGAL CONCERNS/INTERVENTIONS:  None.     SOCIAL HX:  Social History   Tobacco Use  . Smoking status: Never Smoker  . Smokeless tobacco: Never Used  Substance Use Topics  . Alcohol use: No    CODE STATUS:  Full Code ADVANCED DIRECTIVES: N MOST FORM COMPLETE:  N HOSPICE EDUCATION PROVIDED: N PPS:  Wife reports his appetite is normal.  He ambulates independently. Duration of visit and documentation:  30 minutes.      Creola Corn Nashika Coker, LCSW
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
## 2018-06-30 ENCOUNTER — Ambulatory Visit: Payer: Medicare Other | Admitting: Internal Medicine

## 2018-06-30 ENCOUNTER — Ambulatory Visit (INDEPENDENT_AMBULATORY_CARE_PROVIDER_SITE_OTHER): Payer: Medicare Other | Admitting: *Deleted

## 2018-06-30 DIAGNOSIS — Z0000 Encounter for general adult medical examination without abnormal findings: Secondary | ICD-10-CM

## 2018-07-30 ENCOUNTER — Telehealth: Payer: Self-pay | Admitting: *Deleted

## 2018-07-30 ENCOUNTER — Telehealth: Payer: Self-pay | Admitting: Physician Assistant

## 2018-07-30 NOTE — Telephone Encounter (Signed)
New Message ° ° ° °Left message to confirm appt and answer covid questions  °

## 2018-07-30 NOTE — Telephone Encounter (Signed)
Contacted residence and left a voicemail to arrange a home visit. Left [facility] contact information for return call.

## 2018-07-31 ENCOUNTER — Encounter: Payer: Self-pay | Admitting: Physician Assistant

## 2018-07-31 ENCOUNTER — Ambulatory Visit (INDEPENDENT_AMBULATORY_CARE_PROVIDER_SITE_OTHER): Payer: Medicare Other | Admitting: Physician Assistant

## 2018-07-31 ENCOUNTER — Other Ambulatory Visit: Payer: Self-pay

## 2018-07-31 VITALS — BP 152/82 | HR 65 | Ht 72.0 in | Wt 207.4 lb

## 2018-07-31 DIAGNOSIS — I2510 Atherosclerotic heart disease of native coronary artery without angina pectoris: Secondary | ICD-10-CM

## 2018-07-31 DIAGNOSIS — I10 Essential (primary) hypertension: Secondary | ICD-10-CM

## 2018-07-31 DIAGNOSIS — E785 Hyperlipidemia, unspecified: Secondary | ICD-10-CM | POA: Diagnosis not present

## 2018-07-31 DIAGNOSIS — N183 Chronic kidney disease, stage 3 unspecified: Secondary | ICD-10-CM
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
## 2018-07-31 NOTE — Patient Instructions (Signed)
Medication Instructions:  Your physician recommends that you continue on your current medications as directed. Please refer to the Current Medication list given to you today.  If you need a refill on your cardiac medications before your next appointment, please call your pharmacy.   Lab work: None ordered  If you have labs (blood work) drawn today and your tests are completely normal, you will receive your results only by: [name] MyChart Message (if you have MyChart) OR . A paper copy in the mail If you have any lab test that is abnormal or we need to change your treatment, we will call you to review the results.  Testing/Procedures: None ordered  Follow-Up: At [facility] you and your health needs are our priority.  As part of our continuing mission to provide you with exceptional heart care, we have created designated Provider Care Teams.  These Care Teams include your primary Cardiologist (physician) and Advanced Practice Providers (APPs -  Physician Assistants and Nurse Practitioners) who all work together to provide you with the care you need, when you need it. You will need a follow up appointment in:  12 months.  Please call our office 2 months in advance to schedule this appointment.  You may see Dr. Burt Knack or one of the following Advanced Practice Providers on your designated Care Team: Richardson Dopp, PA-C [place], [place] . Daune Perch, NP  Any Other Special Instructions Will Be Listed Below (If Applicable).

## 2018-08-07 ENCOUNTER — Telehealth: Payer: Self-pay | Admitting: Licensed Clinical Social Worker

## 2018-08-07 NOTE — Telephone Encounter (Signed)
Palliative Care SW left a vm with patient to schedule a visit. 

## 2018-08-14 ENCOUNTER — Telehealth: Payer: Self-pay | Admitting: Licensed Clinical Social Worker

## 2018-08-14 NOTE — Telephone Encounter (Signed)
Palliative Care SW left a vm for patient and his wife, Plassie, to schedule a visit.

## 2018-08-17 ENCOUNTER — Other Ambulatory Visit: Payer: Self-pay | Admitting: Internal Medicine

## 2018-08-17 DIAGNOSIS — I25110 Atherosclerotic heart disease of native coronary artery with unstable angina pectoris: Secondary | ICD-10-CM

## 2018-08-27 ENCOUNTER — Other Ambulatory Visit: Payer: Self-pay | Admitting: *Deleted

## 2018-09-04 ENCOUNTER — Other Ambulatory Visit: Payer: Self-pay | Admitting: Internal Medicine

## 2018-09-04 ENCOUNTER — Other Ambulatory Visit: Payer: Medicare Other | Admitting: Licensed Clinical Social Worker

## 2018-09-04 ENCOUNTER — Other Ambulatory Visit: Payer: Self-pay

## 2018-09-04 NOTE — Progress Notes (Unsigned)
COMMUNITY PALLIATIVE CARE SW NOTE  PATIENT NAME: Robert Clayton DOB: [birth year] MRN: [id number]  PRIMARY CARE PROVIDER: Janith Lima, MD  RESPONSIBLE PARTY:  Acct ID - Guarantor Home Phone Work Phone Relationship Acct Type  [id number] Inetta Fermo [phone]  Self P/F     [address], [place], [place] [address]   Due to the COVID-19 crisis, this virtual check-in visit was done via telephone from my office and it was initiated and consent given by this patient and or family.   PLAN OF CARE and INTERVENTIONS:             1. GOALS OF CARE/ ADVANCE CARE PLANNING:  Patient's goal is to remain in his home with his wife, Robert Clayton.  He is a full code. 2. SOCIAL/EMOTIONAL/SPIRITUAL ASSESSMENT/ INTERVENTIONS:  SW conducted a virtual check-in visit with patient's wife, Robert Clayton.  She stated patient was doing well and there were no changes at this time.  The conversation was brief because she stated she was very busy.  SW encouraged her to contact Palliative Care with any changes or questions. 3. PATIENT/CAREGIVER EDUCATION/ COPING:  Robert Clayton expresses her feelings openly. 4. PERSONAL EMERGENCY PLAN:  Family will contact EMS. 5. COMMUNITY RESOURCES COORDINATION/ HEALTH CARE NAVIGATION:  None. 6. FINANCIAL/LEGAL CONCERNS/INTERVENTIONS:  None.     SOCIAL HX:  Social History   Tobacco Use  . Smoking status: Never Smoker  . Smokeless tobacco: Never Used  Substance Use Topics  . Alcohol use: No    CODE STATUS:  Full Code ADVANCED DIRECTIVES: No MOST FORM COMPLETE:  No HOSPICE EDUCATION PROVIDED:  No PPS:  Patient's appetite is normal.  He is able to ambulate independently. Duration of visit and documentation:  30 minutes.      Creola Corn Byron Tipping, LCSW

## 2018-09-09 NOTE — Patient Outreach (Addendum)
[facility]) Care Management  08/27/2018 Late entry  Robert Clayton Sep 06, 1930 [id number]  [place] telephone call to patient. Patient wife is the caregiver. The patient is sleeping. The patient stays up most of the night and sleeps during the day.   [place] compliance verified.Per wife she is not sure if he checked his blood sugar. RN reiterated the importance of checking his blood sugar and the range of 80-130 for fasting blood sugar. RN discussed the importance of healthy eating. Patient wife has agreed for further outreach calls.   Current Medications:  Current Outpatient Medications  Medication Sig Dispense Refill  . acetaminophen (TYLENOL) 325 MG tablet Take 650 mg by mouth every 6 (six) hours as needed.    [name] allopurinol (ZYLOPRIM) 300 MG tablet TAKE 1 TABLET BY MOUTH EVERY DAY 90 tablet 1  . aspirin EC 81 MG tablet Take 81 mg by mouth daily.    [name] atorvastatin (LIPITOR) 10 MG tablet TAKE 1 TABLET BY MOUTH EVERY DAY 90 tablet 1  . B-D INS SYR ULTRAFINE 1CC/30G 30G X 1/2" 1 ML MISC USE AS DIRECTED TWICE A DAY 200 each 3  . Blood Glucose Monitoring Suppl (ACCU-CHEK GUIDE ME) w/Device KIT 1 Act by Does not apply route 4 (four) times daily - after meals and at bedtime. 2 kit 0  . glucose blood (ACCU-CHEK GUIDE) test strip Use QID 400 each 1  . hydrochlorothiazide (HYDRODIURIL) 12.5 MG tablet Take 1 tablet (12.5 mg total) by mouth daily. 90 tablet 1  . insulin aspart (NOVOLOG FLEXPEN) 100 UNIT/ML FlexPen Inject 10 Units into the skin 3 (three) times daily with meals. 45 mL 1  . Insulin Pen Needle (NOVOFINE) 32G X 6 MM MISC 1 Act by Does not apply route 4 (four) times daily - after meals and at bedtime. 400 each 1  . isosorbide mononitrate (IMDUR) 30 MG 24 hr tablet TAKE 1 TABLET BY MOUTH EVERY DAY 90 tablet 1  . Lancets (ONETOUCH DELICA PLUS NFAOZH08M) MISC TEST 2 TIMES DAILY. DX: E11.51 100 each 0  . metoprolol tartrate (LOPRESSOR) 50 MG tablet TAKE 1 TABLET TWICE A DAY  180 tablet 1  . nitroGLYCERIN (NITROSTAT) 0.4 MG SL tablet Place 1 tablet (0.4 mg total) under the tongue every 5 (five) minutes as needed. x3 doses as needed for chest pain 30 tablet 0  . omeprazole (PRILOSEC) 40 MG capsule TAKE 1 CAPSULE BY MOUTH EVERY DAY 90 capsule 1  . telmisartan (MICARDIS) 80 MG tablet TAKE 1 TABLET BY MOUTH EVERY DAY 90 tablet 1   No current facility-administered medications for this visit.     Functional Status:  In your present state of health, do you have any difficulty performing the following activities: 08/27/2018 06/30/2018  Hearing? Y N  Vision? - N  Difficulty concentrating or making decisions? - N  Walking or climbing stairs? Y N  Comment - -  Dressing or bathing? N N  Doing errands, shopping? N N  Preparing Food and eating ? N N  Using the Toilet? N N  In the past six months, have you accidently leaked urine? N N  Do you have problems with loss of bowel control? - N  Managing your Medications? N N  Managing your Finances? N N  Housekeeping or managing your Housekeeping? Y N  Comment - -  Some recent data might be hidden    Fall/Depression Screening: Fall Risk  08/27/2018 06/30/2018 05/28/2018  Falls in the past year? 0 0 0  Number falls in past yr: 0 0 -  Injury with Fall? 0 - -  Risk for fall due to : Impaired balance/gait;Impaired mobility (No Data) -  Risk for fall due to: Comment - states he is not as steady as he use to be but has not fallen. -  Follow up Falls evaluation completed Falls prevention discussed -   PHQ 2/9 Scores 06/30/2018 05/28/2018 02/10/2018 04/29/2017 04/24/2017 04/15/2016 02/23/2015  PHQ - 2 Score 0 0 0 0 1 0 0  PHQ- 9 Score - - - - 4 - -   [facility] CM Care Plan Problem One     Most Recent Value  Care Plan Problem One  Knowledge Deficit in Self Management of Diabtes  Role Documenting the Problem One  [place] for Problem One  Active  [facility]N Long Term Goal   Patient will see a decrease in A1C from 10.0 within the next  90 days  Interventions for Problem One Long Term Goal  RN reiterated with the patient wife about what the A1C means. RN discussed the importance of checking the blood sugars. RN sent educational material on why check blood sugar and why get A1C checked. RN willfollow upwith furthe discussion  [facility] CM Short Term Goal #1   Patient will verbalize checking blood sugars and documenting within the next 30 days  Interventions for Short Term Goal #1  Patient wife received calendar book. RN will encoutage and follow up with patient on checking blood sugars as per ordered  [facility] CM Short Term Goal #2   Patient will verbalize using safety precautions during the Coronavirus pandemic within the next 30 days  Interventions for Short Term Goal #2  RN reiterates the importance of safety precautions during pandemic. RN will follow up with further discussion and reiterating.  [facility]N CM Short Term Goal #3  patient will verbalize understanding the need tofollow up with health maintenance within the next 30 days  [facility] CM Short Term Goal #3 Start Date  08/27/18  Interventions for Short Tern Goal #3  RN discussed with wife about getting eye exams and flu shots. RN will follow up with further discussions.      Assessment:  Patient is not checking blood sugars as per ordered Patient is not adhering to diet Patient is not taking medications on regular basis due to sleep pattern.  Patient will benefit from Health Coach telephonic outreach for education and support for diabetes self management. Plan:  Pharmacy referral for medication adherence RN discussed the importance of checking blood sugar as per ordered RN discussed what the A1C means RN discussed healthy eating RN sent EMMI educational material on checking blood sugar RN sent EMMI educational material on what the A1C is RN will follow up within the month of November  Robert Clayton [place] Management [phone]

## 2018-09-10 ENCOUNTER — Other Ambulatory Visit: Payer: Self-pay

## 2018-09-10 NOTE — Patient Outreach (Addendum)
[name]) Care Management  [place]   09/10/2018  Robert Clayton 1930-03-13 [id number]  Reason for referral: Medication Adherence  Referral source: [facility] RN Current insurance: Unknown; Medicare Part A/B  PMHx includes but not limited to: CAD, PAD, T2DM, HTN, CKD stage III (CrCl / eGFR borderline 30 ml/min), chronic diastolic CHF (40-45% '91'36), GERD w/ esophagitis  Outreach:  Successful telephone call with patient's spouse. Patient was unavailable at that time. She said that Robert Clayton handles his medications and it would be best to discuss with him. She did not report any confusion with his medications.She believes patient sleeps during the day and is awake at night because he previously worked 3rd shift.   Plan: . Will follow up again with patient within the next 1 week   Oralia Manis, PharmD Candidate [place] with [facility]  Ralene Bathe, PharmD, [place] [phone]

## 2018-09-16 ENCOUNTER — Other Ambulatory Visit: Payer: Self-pay | Admitting: Pharmacist

## 2018-09-16 ENCOUNTER — Ambulatory Visit: Payer: Self-pay | Admitting: Pharmacist

## 2018-09-16 NOTE — Patient Outreach (Signed)
[facility]) Care Management  [place]  09/16/2018  Robert Clayton [birth year] [id number]   Reason for referral: Medication Adherence  Referral source: [facility] RN Current insurance: Unknown; Medicare Part A/B  PMHx includes but not limited to: CAD, PAD, T2DM, HTN, CKD stage III (CrCl / eGFR borderline 30 ml/min), chronic diastolic CHF (27-25% '36'64), GERD w/ esophagitis  Outreach:  Unsuccessful telephone call attempt #2 to patient.   Unable to leave message.  Male answered phone, stated patient not home, and disconnected.    Plan:  -I will make another outreach attempt to patient within 3-4 business days.    Ralene Bathe, PharmD, [place] [phone]

## 2018-09-21 ENCOUNTER — Other Ambulatory Visit: Payer: Self-pay | Admitting: Pharmacist

## 2018-09-21 NOTE — Patient Outreach (Signed)
[facility]) Care Management  [place]  09/21/2018  Robert Clayton [birth year] [id number]   Reason for referral:Medication Adherence  Referral source:[facility] RN Current insurance:Unknown; Medicare Part A/B  PMHx includes but not limited to:CAD, PAD, T2DM, HTN, CKD stage III(CrCl / eGFR borderline 30 ml/min),chronic diastolic CHF (56-15% '37'94), GERD w/ esophagitis  Outreach:  Unsuccessful telephone call attempt #2 to patient.   Message left with spouse requesting a return call from patient  Plan:  -I will make another outreach attempt to patient within 3-4 business days.    Ralene Bathe, PharmD, [place] [phone]

## 2018-09-25 ENCOUNTER — Ambulatory Visit: Payer: Self-pay | Admitting: Pharmacist

## 2018-09-30 ENCOUNTER — Encounter: Payer: Self-pay | Admitting: Internal Medicine

## 2018-09-30 ENCOUNTER — Other Ambulatory Visit: Payer: Self-pay

## 2018-09-30 ENCOUNTER — Ambulatory Visit (INDEPENDENT_AMBULATORY_CARE_PROVIDER_SITE_OTHER): Payer: Medicare Other | Admitting: Internal Medicine

## 2018-09-30 ENCOUNTER — Other Ambulatory Visit (INDEPENDENT_AMBULATORY_CARE_PROVIDER_SITE_OTHER): Payer: Medicare Other

## 2018-09-30 VITALS — BP 156/78 | HR 68 | Temp 98.6°F | Resp 16 | Ht 72.0 in | Wt 202.0 lb

## 2018-09-30 DIAGNOSIS — E1165 Type 2 diabetes mellitus with hyperglycemia: Secondary | ICD-10-CM

## 2018-09-30 DIAGNOSIS — E1151 Type 2 diabetes mellitus with diabetic peripheral angiopathy without gangrene: Secondary | ICD-10-CM | POA: Diagnosis not present

## 2018-09-30 DIAGNOSIS — I10 Essential (primary) hypertension: Secondary | ICD-10-CM

## 2018-09-30 DIAGNOSIS — E785 Hyperlipidemia, unspecified: Secondary | ICD-10-CM

## 2018-09-30 DIAGNOSIS — IMO0002 Reserved for concepts with insufficient information to code with codable children: Secondary | ICD-10-CM

## 2018-09-30 DIAGNOSIS — I2510 Atherosclerotic heart disease of native coronary artery without angina pectoris: Secondary | ICD-10-CM

## 2018-09-30 DIAGNOSIS — D518 Other vitamin B12 deficiency anemias: Secondary | ICD-10-CM

## 2018-09-30 DIAGNOSIS — N183 Chronic kidney disease, stage 3 unspecified: Secondary | ICD-10-CM

## 2018-09-30 DIAGNOSIS — Z23 Encounter for immunization: Secondary | ICD-10-CM

## 2018-09-30 LAB — TSH: TSH: 1.45 u[IU]/mL (ref 0.35–4.50)

## 2018-09-30 LAB — URINALYSIS, ROUTINE W REFLEX MICROSCOPIC
Bilirubin Urine: NEGATIVE
Hgb urine dipstick: NEGATIVE
Ketones, ur: NEGATIVE
Leukocytes,Ua: NEGATIVE
Nitrite: NEGATIVE
RBC / HPF: NONE SEEN (ref 0–?)
Specific Gravity, Urine: 1.01 (ref 1.000–1.030)
Total Protein, Urine: NEGATIVE
Urine Glucose: NEGATIVE
Urobilinogen, UA: 0.2 (ref 0.0–1.0)
pH: 6 (ref 5.0–8.0)

## 2018-09-30 LAB — CBC WITH DIFFERENTIAL/PLATELET
Basophils Absolute: 0.1 10*3/uL (ref 0.0–0.1)
Basophils Relative: 0.9 % (ref 0.0–3.0)
Eosinophils Absolute: 0.2 10*3/uL (ref 0.0–0.7)
Eosinophils Relative: 1.9 % (ref 0.0–5.0)
HCT: 37.9 % — ABNORMAL LOW (ref 39.0–52.0)
Hemoglobin: 12.5 g/dL — ABNORMAL LOW (ref 13.0–17.0)
Lymphocytes Relative: 27 % (ref 12.0–46.0)
Lymphs Abs: 2.5 10*3/uL (ref 0.7–4.0)
MCHC: 32.9 g/dL (ref 30.0–36.0)
MCV: 95.3 fl (ref 78.0–100.0)
Monocytes Absolute: 0.9 10*3/uL (ref 0.1–1.0)
Monocytes Relative: 9.6 % (ref 3.0–12.0)
Neutro Abs: 5.7 10*3/uL (ref 1.4–7.7)
Neutrophils Relative %: 60.6 % (ref 43.0–77.0)
Platelets: 93 10*3/uL — ABNORMAL LOW (ref 150.0–400.0)
RBC: 3.98 Mil/uL — ABNORMAL LOW (ref 4.22–5.81)
RDW: 16 % — ABNORMAL HIGH (ref 11.5–15.5)
WBC: 9.4 10*3/uL (ref 4.0–10.5)

## 2018-09-30 LAB — LIPID PANEL
Cholesterol: 108 mg/dL (ref 0–200)
HDL: 30.9 mg/dL — ABNORMAL LOW (ref >39.00)
LDL Cholesterol: 48 mg/dL (ref 0–99)
NonHDL: 77.58
Total CHOL/HDL Ratio: 4
Triglycerides: 148 mg/dL (ref 0.0–149.0)
VLDL: 29.6 mg/dL (ref 0.0–40.0)

## 2018-09-30 LAB — MICROALBUMIN / CREATININE URINE RATIO
Creatinine,U: 103.2 mg/dL
Microalb Creat Ratio: 1.7 mg/g (ref 0.0–30.0)
Microalb, Ur: 1.8 mg/dL (ref 0.0–1.9)

## 2018-09-30 LAB — BASIC METABOLIC PANEL
BUN: 35 mg/dL — ABNORMAL HIGH (ref 6–23)
CO2: 25 mEq/L (ref 19–32)
Calcium: 9.7 mg/dL (ref 8.4–10.5)
Chloride: 109 mEq/L (ref 96–112)
Creatinine, Ser: 2.36 mg/dL — ABNORMAL HIGH (ref 0.40–1.50)
GFR: 31.84 mL/min — ABNORMAL LOW (ref >60.00)
Glucose, Bld: 87 mg/dL (ref 70–99)
Potassium: 5 mEq/L (ref 3.5–5.1)
Sodium: 142 mEq/L (ref 135–145)

## 2018-09-30 LAB — FOLATE: Folate: 8.5 ng/mL (ref >5.9)

## 2018-09-30 LAB — HEMOGLOBIN A1C: Hgb A1c MFr Bld: 10.6 % — ABNORMAL HIGH (ref 4.6–6.5)

## 2018-09-30 MED ORDER — CYANOCOBALAMIN 1000 MCG/ML IJ SOLN
1000.0000 ug | Freq: Once | INTRAMUSCULAR | Status: AC
  Administered 2018-09-30: 15:00:00 1000 ug via INTRAMUSCULAR

## 2018-09-30 NOTE — Patient Instructions (Signed)
Type 2 Diabetes Mellitus, Diagnosis, Adult Type 2 diabetes (type 2 diabetes mellitus) is a long-term (chronic) disease. In type 2 diabetes, one or both of these problems may be present:  The pancreas does not make enough of a hormone called insulin.  Cells in the body do not respond properly to insulin that the body makes (insulin resistance). Normally, insulin allows blood sugar (glucose) to enter cells in the body. The cells use glucose for energy. Insulin resistance or lack of insulin causes excess glucose to build up in the blood instead of going into cells. As a result, high blood glucose (hyperglycemia) develops. What increases the risk? The following factors may make you more likely to develop type 2 diabetes:  Having a family member with type 2 diabetes.  Being overweight or obese.  Having an inactive (sedentary) lifestyle.  Having been diagnosed with insulin resistance.  Having a history of prediabetes, gestational diabetes, or polycystic ovary syndrome (PCOS).  Being of American-Indian, African-American, Hispanic/Latino, or Asian/Pacific Islander descent. What are the signs or symptoms? In the early stage of this condition, you may not have symptoms. Symptoms develop slowly and may include:  Increased thirst (polydipsia).  Increased hunger(polyphagia).  Increased urination (polyuria).  Increased urination during the night (nocturia).  Unexplained weight loss.  Frequent infections that keep coming back (recurring).  Fatigue.  Weakness.  Vision changes, such as blurry vision.  Cuts or bruises that are slow to heal.  Tingling or numbness in the hands or feet.  Dark patches on the skin (acanthosis nigricans). How is this diagnosed? This condition is diagnosed based on your symptoms, your medical history, a physical exam, and your blood glucose level. Your blood glucose may be checked with one or more of the following blood tests:  A fasting blood glucose (FBG)  test. You will not be allowed to eat (you will fast) for 8 hours or longer before a blood sample is taken.  A random blood glucose test. This test checks blood glucose at any time of day regardless of when you ate.  An A1c (hemoglobin A1c) blood test. This test provides information about blood glucose control over the previous 2-3 months.  An oral glucose tolerance test (OGTT). This test measures your blood glucose at two times: ? After fasting. This is your baseline blood glucose level. ? Two hours after drinking a beverage that contains glucose. You may be diagnosed with type 2 diabetes if:  Your FBG level is 126 mg/dL (7.0 mmol/L) or higher.  Your random blood glucose level is 200 mg/dL (11.1 mmol/L) or higher.  Your A1c level is 6.5% or higher.  Your OGTT result is higher than 200 mg/dL (11.1 mmol/L). These blood tests may be repeated to confirm your diagnosis. How is this treated? Your treatment may be managed by a specialist called an endocrinologist. Type 2 diabetes may be treated by following instructions from your health care provider about:  Making diet and lifestyle changes. This may include: ? Following an individualized nutrition plan that is developed by a diet and nutrition specialist (registered dietitian). ? Exercising regularly. ? Finding ways to manage stress.  Checking your blood glucose level as often as told.  Taking diabetes medicines or insulin daily. This helps to keep your blood glucose levels in the healthy range. ? If you use insulin, you may need to adjust the dosage depending on how physically active you are and what foods you eat. Your health care provider will tell you how to adjust your dosage.    Taking medicines to help prevent complications from diabetes, such as: ? Aspirin. ? Medicine to lower cholesterol. ? Medicine to control blood pressure. Your health care provider will set individualized treatment goals for you. Your goals will be based on  your age, other medical conditions you have, and how you respond to diabetes treatment. Generally, the goal of treatment is to maintain the following blood glucose levels:  Before meals (preprandial): 80-130 mg/dL (4.4-7.2 mmol/L).  After meals (postprandial): below 180 mg/dL (10 mmol/L).  A1c level: less than 7%. Follow these instructions at home: Questions to ask your health care provider  Consider asking the following questions: ? Do I need to meet with a diabetes educator? ? Where can I find a support group for people with diabetes? ? What equipment will I need to manage my diabetes at home? ? What diabetes medicines do I need, and when should I take them? ? How often do I need to check my blood glucose? ? What number can I call if I have questions? ? When is my next appointment? General instructions  Take over-the-counter and prescription medicines only as told by your health care provider.  Keep all follow-up visits as told by your health care provider. This is important.  For more information about diabetes, visit: ? American Diabetes Association (ADA): www.diabetes.org ? American Association of Diabetes Educators (AADE): www.diabeteseducator.org Contact a health care provider if:  Your blood glucose is at or above 240 mg/dL (13.3 mmol/L) for 2 days in a row.  You have been sick or have had a fever for 2 days or longer, and you are not getting better.  You have any of the following problems for more than 6 hours: ? You cannot eat or drink. ? You have nausea and vomiting. ? You have diarrhea. Get help right away if:  Your blood glucose is lower than 54 mg/dL (3.0 mmol/L).  You become confused or you have trouble thinking clearly.  You have difficulty breathing.  You have moderate or large ketone levels in your urine. Summary  Type 2 diabetes (type 2 diabetes mellitus) is a long-term (chronic) disease. In type 2 diabetes, the pancreas does not make enough of a  hormone called insulin, or cells in the body do not respond properly to insulin that the body makes (insulin resistance).  This condition is treated by making diet and lifestyle changes and taking diabetes medicines or insulin.  Your health care provider will set individualized treatment goals for you. Your goals will be based on your age, other medical conditions you have, and how you respond to diabetes treatment.  Keep all follow-up visits as told by your health care provider. This is important. This information is not intended to replace advice given to you by your health care provider. Make sure you discuss any questions you have with your health care provider. Document Released: 12/24/2004 Document Revised: 02/21/2017 Document Reviewed: 01/27/2015 Elsevier Patient Education  2020 [employer]  

## 2018-09-30 NOTE — Progress Notes (Signed)
Subjective:  Patient ID: Robert Clayton, male    DOB: March 06, 1930  Age: 85 y.o. MRN: [id number]  CC: Anemia, Hypertension, Hyperlipidemia, and Diabetes   HPI Robert Clayton presents for f/up - He continues to complain of tingling on the bottom of his feet but otherwise offers no new complaints.  He tells me he is doing the best he can to control his blood sugars.  He denies any recent polys.  Outpatient Medications Prior to Visit  Medication Sig Dispense Refill  . acetaminophen (TYLENOL) 325 MG tablet Take 650 mg by mouth every 6 (six) hours as needed.    [name] allopurinol (ZYLOPRIM) 300 MG tablet TAKE 1 TABLET BY MOUTH EVERY DAY 90 tablet 1  . aspirin EC 81 MG tablet Take 81 mg by mouth daily.    [name] atorvastatin (LIPITOR) 10 MG tablet TAKE 1 TABLET BY MOUTH EVERY DAY 90 tablet 1  . B-D INS SYR ULTRAFINE 1CC/30G 30G X 1/2" 1 ML MISC USE AS DIRECTED TWICE A DAY 200 each 3  . Blood Glucose Monitoring Suppl (ACCU-CHEK GUIDE ME) w/Device KIT 1 Act by Does not apply route 4 (four) times daily - after meals and at bedtime. 2 kit 0  . glucose blood (ACCU-CHEK GUIDE) test strip Use QID 400 each 1  . hydrochlorothiazide (HYDRODIURIL) 12.5 MG tablet Take 1 tablet (12.5 mg total) by mouth daily. 90 tablet 1  . insulin aspart (NOVOLOG FLEXPEN) 100 UNIT/ML FlexPen Inject 10 Units into the skin 3 (three) times daily with meals. 45 mL 1  . Insulin Pen Needle (NOVOFINE) 32G X 6 MM MISC 1 Act by Does not apply route 4 (four) times daily - after meals and at bedtime. 400 each 1  . isosorbide mononitrate (IMDUR) 30 MG 24 hr tablet TAKE 1 TABLET BY MOUTH EVERY DAY 90 tablet 1  . Lancets (ONETOUCH DELICA PLUS [id number]) MISC TEST 2 TIMES DAILY. DX: E11.51 100 each 0  . metoprolol tartrate (LOPRESSOR) 50 MG tablet TAKE 1 TABLET TWICE A DAY 180 tablet 1  . nitroGLYCERIN (NITROSTAT) 0.4 MG SL tablet Place 1 tablet (0.4 mg total) under the tongue every 5 (five) minutes as needed. x3 doses as needed for chest pain 30 tablet  0  . omeprazole (PRILOSEC) 40 MG capsule TAKE 1 CAPSULE BY MOUTH EVERY DAY 90 capsule 1  . telmisartan (MICARDIS) 80 MG tablet TAKE 1 TABLET BY MOUTH EVERY DAY 90 tablet 1   No facility-administered medications prior to visit.     ROS Review of Systems  Constitutional: Negative for diaphoresis, fatigue and unexpected weight change.  HENT: Negative.   Respiratory: Negative for cough, chest tightness, shortness of breath and wheezing.   Gastrointestinal: Negative for abdominal pain, constipation, diarrhea, nausea and vomiting.  Endocrine: Negative.  Negative for polydipsia, polyphagia and polyuria.  Genitourinary: Negative.  Negative for difficulty urinating.  Musculoskeletal: Negative.  Negative for arthralgias and myalgias.  Skin: Negative.  Negative for color change and pallor.  Neurological: Negative.  Negative for dizziness, weakness and light-headedness.  Hematological: Negative for adenopathy. Does not bruise/bleed easily.  Psychiatric/Behavioral: Negative.     Objective:  BP (!) 156/78 (BP Location: Left Arm, Patient Position: Sitting, Cuff Size: Normal)   Pulse 68   Temp 98.6 F (37 C) (Oral)   Resp 16   Ht 6' (1.829 m)   Wt 202 lb (91.6 kg)   SpO2 99%   BMI 27.40 kg/m   BP Readings from Last 3 Encounters:  09/30/18 (!) 156/78  07/31/18 (!) 152/82  03/30/18 (!) 180/80    Wt Readings from Last 3 Encounters:  09/30/18 202 lb (91.6 kg)  07/31/18 207 lb 6.4 oz (94.1 kg)  03/30/18 212 lb (96.2 kg)    Physical Exam Vitals signs reviewed.  HENT:     Nose: Nose normal. No rhinorrhea.     Mouth/Throat:     Pharynx: No oropharyngeal exudate.  Eyes:     General: No scleral icterus.    Conjunctiva/sclera: Conjunctivae normal.  Neck:     Musculoskeletal: Normal range of motion. No muscular tenderness.  Cardiovascular:     Rate and Rhythm: Normal rate.     Heart sounds: Murmur present. Systolic murmur present with a grade of 1/6. No diastolic murmur. No gallop.       Comments: 1/6 SEM LLSB Pulmonary:     Effort: Pulmonary effort is normal.     Breath sounds: No stridor. No wheezing, rhonchi or rales.  Abdominal:     General: Abdomen is flat. Bowel sounds are normal. There is no distension.     Palpations: Abdomen is soft. There is no hepatomegaly, splenomegaly or mass.     Tenderness: There is no abdominal tenderness.  Musculoskeletal: Normal range of motion.     Right lower leg: No edema.     Left lower leg: No edema.  Lymphadenopathy:     Cervical: No cervical adenopathy.  Skin:    General: Skin is warm and dry.  Neurological:     General: No focal deficit present.  Psychiatric:        Mood and Affect: Mood normal.        Behavior: Behavior normal.     Lab Results  Component Value Date   WBC 9.4 09/30/2018   HGB 12.5 (L) 09/30/2018   HCT 37.9 (L) 09/30/2018   PLT 93.0 (L) 09/30/2018   GLUCOSE 87 09/30/2018   CHOL 108 09/30/2018   TRIG 148.0 09/30/2018   HDL 30.90 (L) 09/30/2018   LDLDIRECT 80.6 06/26/2010   LDLCALC 48 09/30/2018   ALT 13 03/30/2018   AST 16 03/30/2018   NA 142 09/30/2018   K 5.0 09/30/2018   CL 109 09/30/2018   CREATININE 2.36 (H) 09/30/2018   BUN 35 (H) 09/30/2018   CO2 25 09/30/2018   TSH 1.45 09/30/2018   PSA 0.03 (L) 06/29/2009   INR 1.1 04/08/2008   HGBA1C 10.6 (H) 09/30/2018   MICROALBUR 1.8 09/30/2018    Ct Head Wo Contrast  Result Date: 11/04/2017 CLINICAL DATA:  Acute headache, worst headache of life, right-sided, prior subdural hematoma years ago EXAM: CT HEAD WITHOUT CONTRAST TECHNIQUE: Contiguous axial images were obtained from the base of the skull through the vertex without intravenous contrast. COMPARISON:  CT brain scan of 08/29/2016 FINDINGS: Brain: The ventricular system remains prominent as are the cortical sulci indicative of diffuse atrophy. The septum is midline in position. Mild small vessel ischemic change remains throughout the periventricular white matter. No hemorrhage, mass  lesion, or acute infarction is seen. Vascular: No vascular abnormality is evident on this unenhanced study. Skull: Changes of right parietal craniotomy again are noted. No complicating features are seen. Sinuses/Orbits: The paranasal sinuses appear well pneumatized. Other: None. IMPRESSION: 1. No change in atrophy and mild small vessel ischemic change in the periventricular white matter. 2. Stable right parietal craniotomy. Electronically Signed   By: Ivar Drape M.D.   On: 11/04/2017 10:24    Assessment & Plan:   Robert Clayton was seen today for  anemia, hypertension, hyperlipidemia and diabetes.  Diagnoses and all orders for this visit:  ANEMIA, B12 DEFICIENCY- His H&H are stable.  He will continue monthly B12 injections. -     CBC with Differential/Platelet; Future -     Folate; Future -     cyanocobalamin ((VITAMIN B-12)) injection 1,000 mcg  Hyperlipidemia with target LDL less than 70- He has achieved his LDL goal is doing well on the statin. -     Lipid panel; Future -     TSH; Future  DM (diabetes mellitus), type 2, uncontrolled, periph vascular complic ([place])- His T5H remains elevated but he tells me this is the best that he can do.  Will continue the current insulin regimen. -     Basic metabolic panel; Future -     Hemoglobin A1c; Future -     Microalbumin / creatinine urine ratio; Future -     HM Diabetes Foot Exam  Essential hypertension- His blood pressure is adequately well controlled. -     Basic metabolic panel; Future -     Urinalysis, Routine w reflex microscopic; Future  Need for influenza vaccination -     Flu Vaccine QUAD High Dose(Fluad)  CRI (chronic renal insufficiency), stage 3 (moderate) (HCC)- His renal function is stable.  He was encouraged to avoid nephrotoxic agents.   I am having Robert Clayton maintain his nitroGLYCERIN, acetaminophen, metoprolol tartrate, OneTouch Delica Plus [id number], insulin aspart, Insulin Pen Needle, Accu-Chek Guide Me, glucose blood, B-D  INS SYR ULTRAFINE 1CC/30G, hydrochlorothiazide, atorvastatin, telmisartan, omeprazole, aspirin EC, isosorbide mononitrate, and allopurinol. We administered cyanocobalamin.  Meds ordered this encounter  Medications  . cyanocobalamin ((VITAMIN B-12)) injection 1,000 mcg     Follow-up: Return in about 6 months (around 03/30/2019).  [name], MD

## 2018-10-06 ENCOUNTER — Telehealth: Payer: Self-pay | Admitting: Hospice

## 2018-10-06 NOTE — Telephone Encounter (Signed)
Called patient to schedule a Palliative f/u visit with the NP, spoke with wife Plassie and she said I would need to talk with patient and that he was unable to come to the phone.  I will call back later today.

## 2018-10-08 ENCOUNTER — Telehealth: Payer: Self-pay | Admitting: Hospice

## 2018-10-08 NOTE — Telephone Encounter (Signed)
Called patient to schedule a Palliative f/u visit, no answer - left message with reason for call along with my contact information 

## 2018-10-13 ENCOUNTER — Other Ambulatory Visit: Payer: Self-pay | Admitting: Pharmacist

## 2018-10-13 NOTE — Patient Outreach (Addendum)
[facility]) Care Management [place]  10/13/2018  Robert Clayton [birth year] [id number]  Reason for referral:Medication Adherence  Referral source:[facility] RN Current insurance:Unknown; Medicare Part A/B  PMHx includes but not limited to:CAD, PAD, T2DM, HTN, CKD stage III(CrCl / eGFR borderline 30 ml/min),chronic diastolic CHF (09-32% '67'12), GERD w/ esophagitis  Outreach:  Unsuccessful telephone call attempt #3 to patient. Message left with spouse requesting a return call from patient  Plan:  -I will close [place] case at this time as I have been unable to establish and/or maintain contact with patient.  -I am happy to assist in the future as needed.   Ralene Bathe, PharmD, [place] [phone]

## 2018-10-15 DIAGNOSIS — Z794 Long term (current) use of insulin: Secondary | ICD-10-CM | POA: Diagnosis not present

## 2018-10-15 DIAGNOSIS — E119 Type 2 diabetes mellitus without complications: Secondary | ICD-10-CM | POA: Diagnosis not present

## 2018-10-15 DIAGNOSIS — Z961 Presence of intraocular lens: Secondary | ICD-10-CM | POA: Insufficient documentation

## 2018-10-15 LAB — HM DIABETES EYE EXAM

## 2018-10-17 ENCOUNTER — Other Ambulatory Visit: Payer: Self-pay | Admitting: Endocrinology

## 2018-10-18 ENCOUNTER — Other Ambulatory Visit: Payer: Self-pay | Admitting: Internal Medicine

## 2018-10-18 DIAGNOSIS — I129 Hypertensive chronic kidney disease with stage 1 through stage 4 chronic kidney disease, or unspecified chronic kidney disease: Secondary | ICD-10-CM

## 2018-10-18 NOTE — Telephone Encounter (Signed)
Please forward refill request to pt's primary care provider.   

## 2018-10-19 ENCOUNTER — Other Ambulatory Visit: Payer: Self-pay | Admitting: Internal Medicine

## 2018-10-19 NOTE — Telephone Encounter (Signed)
Per Dr. Ellison's request, I am forwarding you this refill request. Please review and refill if appropriate 

## 2018-10-21 ENCOUNTER — Other Ambulatory Visit: Payer: Self-pay | Admitting: Internal Medicine

## 2018-10-21 ENCOUNTER — Telehealth: Payer: Self-pay | Admitting: Hospice

## 2018-10-21 NOTE — Telephone Encounter (Signed)
Spoke with patient to schedule a Palliative f/u visit and he said that he was doing fine and didn't need anyone checking in on him and then said,  "Have a nice day" and hung up.  I will notify Dr. [name] and Palliative NP.

## 2018-10-24 ENCOUNTER — Telehealth: Payer: Self-pay | Admitting: Endocrinology

## 2018-10-24 NOTE — Telephone Encounter (Signed)
1.  Please schedule f/u appt 2.  Then please refill x 1, pending that appt.  

## 2018-10-26 NOTE — Telephone Encounter (Signed)
Per Dr. Loanne Drilling, unable to refill Novolin 70/30 without an appt. Routing this message to the front desk for scheduling purposes.

## 2018-10-28 ENCOUNTER — Other Ambulatory Visit: Payer: Self-pay

## 2018-10-28 NOTE — Telephone Encounter (Signed)
OK 

## 2018-10-28 NOTE — Telephone Encounter (Signed)
Patient is scheduled for appointment on 10/29/18 at 2:00 p.m.

## 2018-10-28 NOTE — Telephone Encounter (Signed)
I have been informed that he is coming in tomorrow. Would you prefer to address then?

## 2018-10-28 NOTE — Telephone Encounter (Signed)
NOVOLIN 70/30 (70-30) 100 UNIT/ML injection [Pharmacy Med Name: NOVOLIN 70-30 100 UNIT/ML VIAL]  The above mediation was requested for refill but I am not seeing this on his medication list. Please review and advise if I am to refill Novolog Flexpen for current dosage RATHER than what is being requested.

## 2018-10-28 NOTE — Telephone Encounter (Signed)
please contact patient: Which doe he take?

## 2018-10-29 ENCOUNTER — Encounter: Payer: Self-pay | Admitting: Endocrinology

## 2018-10-29 ENCOUNTER — Ambulatory Visit (INDEPENDENT_AMBULATORY_CARE_PROVIDER_SITE_OTHER): Payer: Medicare Other | Admitting: Endocrinology

## 2018-10-29 VITALS — BP 142/64 | HR 77 | Ht 72.0 in | Wt 207.2 lb

## 2018-10-29 DIAGNOSIS — E1151 Type 2 diabetes mellitus with diabetic peripheral angiopathy without gangrene: Secondary | ICD-10-CM

## 2018-10-29 DIAGNOSIS — D518 Other vitamin B12 deficiency anemias: Secondary | ICD-10-CM

## 2018-10-29 DIAGNOSIS — I2510 Atherosclerotic heart disease of native coronary artery without angina pectoris: Secondary | ICD-10-CM

## 2018-10-29 DIAGNOSIS — Z794 Long term (current) use of insulin: Secondary | ICD-10-CM

## 2018-10-29 MED ORDER — INSULIN NPH (HUMAN) (ISOPHANE) 100 UNIT/ML ~~LOC~~ SUSP: SUBCUTANEOUS | 3 refills | Status: DC

## 2018-10-29 MED ORDER — CYANOCOBALAMIN 1000 MCG/ML IJ SOLN
1000.0000 ug | Freq: Once | INTRAMUSCULAR | Status: AC
  Administered 2018-10-29: 1000 ug via INTRAMUSCULAR
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
## 2018-10-29 NOTE — Progress Notes (Signed)
Per orders of Dr. Loanne Drilling, injection of B12 given IM in L deltoid today by A. Zarie Kosiba, LPN . Denies any adverse reactions or discomfort.

## 2018-10-29 NOTE — Patient Instructions (Addendum)
Your blood pressure is high today.  Please see your primary care provider soon, to have it rechecked check your blood sugar twice a day.  vary the time of day when you check, between before the 3 meals, and at bedtime.  also check if you have symptoms of your blood sugar being too high or too low.  please keep a record of the readings and bring it to your next appointment here.  please call us sooner if your blood sugar goes below 70, or if you have a lot of readings over 200.    Please continue the same insulin: 60 units with breakfast, and 30 units with supper.  The insulin should be called "70/30."  Please set aside any insulin at home that does not say 70/30 on it.   On this type of insulin schedule, you should eat meals on a regular schedule (especially lunch).  If a meal is missed or significantly delayed, your blood sugar could go low.   Please come back for a follow-up appointment in 1 month.

## 2018-10-30 ENCOUNTER — Ambulatory Visit: Payer: Medicare Other

## 2018-11-08 ENCOUNTER — Other Ambulatory Visit: Payer: Self-pay | Admitting: Internal Medicine

## 2018-11-27 ENCOUNTER — Other Ambulatory Visit: Payer: Self-pay

## 2018-11-27 ENCOUNTER — Other Ambulatory Visit: Payer: Self-pay | Admitting: *Deleted

## 2018-12-01 ENCOUNTER — Ambulatory Visit (INDEPENDENT_AMBULATORY_CARE_PROVIDER_SITE_OTHER): Payer: Medicare Other | Admitting: Endocrinology

## 2018-12-01 ENCOUNTER — Encounter: Payer: Self-pay | Admitting: Endocrinology

## 2018-12-01 VITALS — BP 180/90 | HR 75 | Ht 72.0 in | Wt 208.0 lb

## 2018-12-01 DIAGNOSIS — IMO0002 Reserved for concepts with insufficient information to code with codable children: Secondary | ICD-10-CM

## 2018-12-01 DIAGNOSIS — I2510 Atherosclerotic heart disease of native coronary artery without angina pectoris: Secondary | ICD-10-CM | POA: Diagnosis not present

## 2018-12-01 DIAGNOSIS — E1165 Type 2 diabetes mellitus with hyperglycemia: Secondary | ICD-10-CM

## 2018-12-01 DIAGNOSIS — E538 Deficiency of other specified B group vitamins: Secondary | ICD-10-CM

## 2018-12-01 DIAGNOSIS — E1151 Type 2 diabetes mellitus with diabetic peripheral angiopathy without gangrene: Secondary | ICD-10-CM | POA: Diagnosis not present

## 2018-12-01 LAB — POCT GLYCOSYLATED HEMOGLOBIN (HGB A1C): Hemoglobin A1C: 8.2 % — AB (ref 4.0–5.6)

## 2018-12-01 MED ORDER — CYANOCOBALAMIN 1000 MCG/ML IJ SOLN
1000.0000 ug | Freq: Once | INTRAMUSCULAR | Status: AC
  Administered 2018-12-01: 1000 ug via INTRAMUSCULAR

## 2018-12-01 NOTE — Patient Instructions (Addendum)
Your blood pressure is high today.  Please see your primary care provider soon, to have it rechecked check your blood sugar twice a day.  vary the time of day when you check, between before the 3 meals, and at bedtime.  also check if you have symptoms of your blood sugar being too high or too low.  please keep a record of the readings and bring it to your next appointment here.  please call us sooner if your blood sugar goes below 70, or if you have a lot of readings over 200.    Please continue the same insulin for now.   On this type of insulin schedule, you should eat meals on a regular schedule (especially lunch).  If a meal is missed or significantly delayed, your blood sugar could go low.   Please come back for a follow-up appointment in 3-4 weeks.  Please ask your wife to come with you.  Also, please bring with you the bottle of insulin you take.
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
## 2018-12-09 ENCOUNTER — Other Ambulatory Visit: Payer: Self-pay | Admitting: Internal Medicine

## 2018-12-09 DIAGNOSIS — IMO0002 Reserved for concepts with insufficient information to code with codable children: Secondary | ICD-10-CM

## 2018-12-09 DIAGNOSIS — E785 Hyperlipidemia, unspecified: Secondary | ICD-10-CM

## 2018-12-09 DIAGNOSIS — E1151 Type 2 diabetes mellitus with diabetic peripheral angiopathy without gangrene: Secondary | ICD-10-CM

## 2018-12-09 DIAGNOSIS — I2510 Atherosclerotic heart disease of native coronary artery without angina pectoris: Secondary | ICD-10-CM

## 2018-12-13 ENCOUNTER — Other Ambulatory Visit: Payer: Self-pay | Admitting: Endocrinology

## 2019-01-11 ENCOUNTER — Other Ambulatory Visit: Payer: Self-pay

## 2019-01-12 ENCOUNTER — Ambulatory Visit (INDEPENDENT_AMBULATORY_CARE_PROVIDER_SITE_OTHER): Payer: Medicare Other | Admitting: Endocrinology

## 2019-01-12 ENCOUNTER — Encounter: Payer: Self-pay | Admitting: Endocrinology

## 2019-01-12 VITALS — BP 180/80 | HR 85 | Ht 72.0 in | Wt 209.6 lb

## 2019-01-12 DIAGNOSIS — E1165 Type 2 diabetes mellitus with hyperglycemia: Secondary | ICD-10-CM

## 2019-01-12 DIAGNOSIS — E1151 Type 2 diabetes mellitus with diabetic peripheral angiopathy without gangrene: Secondary | ICD-10-CM

## 2019-01-12 DIAGNOSIS — D518 Other vitamin B12 deficiency anemias: Secondary | ICD-10-CM | POA: Diagnosis not present

## 2019-01-12 DIAGNOSIS — IMO0002 Reserved for concepts with insufficient information to code with codable children: Secondary | ICD-10-CM

## 2019-01-12 LAB — POCT GLYCOSYLATED HEMOGLOBIN (HGB A1C): Hemoglobin A1C: 8 % — AB (ref 4.0–5.6)

## 2019-01-12 MED ORDER — CYANOCOBALAMIN 1000 MCG/ML IJ SOLN
1000.0000 ug | Freq: Once | INTRAMUSCULAR | Status: AC
  Administered 2019-01-12: 15:00:00 1000 ug via INTRAMUSCULAR

## 2019-01-12 MED ORDER — INSULIN NPH (HUMAN) (ISOPHANE) 100 UNIT/ML ~~LOC~~ SUSP: SUBCUTANEOUS | 3 refills | Status: DC

## 2019-01-12 NOTE — Patient Instructions (Addendum)
Your blood pressure is high today.  Please see your primary care provider soon, to have it rechecked check your blood sugar twice a day.  vary the time of day when you check, between before the 3 meals, and at bedtime.  also check if you have symptoms of your blood sugar being too high or too low.  please keep a record of the readings and bring it to your next appointment here.  please call us sooner if your blood sugar goes below 70, or if you have a lot of readings over 200.    Please change the insulin to 80 units in the morning, and 30 units in the evening. On this type of insulin schedule, you should eat meals on a regular schedule (especially lunch).  If a meal is missed or significantly delayed, your blood sugar could go low.   Please come back for a follow-up appointment in 2 months.
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
## 2019-01-26 ENCOUNTER — Ambulatory Visit: Payer: Medicare Other | Attending: Internal Medicine

## 2019-01-26 DIAGNOSIS — Z23 Encounter for immunization: Secondary | ICD-10-CM | POA: Diagnosis not present

## 2019-01-26 NOTE — Progress Notes (Signed)
   Covid-19 Vaccination Clinic  Name:  Itzel Lowrimore    MRN: [id number] DOB: Nov 02, 1930  01/26/2019  Mr. Hoque was observed post Covid-19 immunization for 15 minutes without incidence. He was provided with Vaccine Information Sheet and instruction to access the V-Safe system.   Mr. Degidio was instructed to call 911 with any severe reactions post vaccine: [name] Difficulty breathing  . Swelling of your face and throat  . A fast heartbeat  . A bad rash all over your body  . Dizziness and weakness    Immunizations Administered    Name Date Dose VIS Date Route   Pfizer COVID-19 Vaccine 01/26/2019  3:35 PM 0.3 mL 12/18/2018 Intramuscular   Manufacturer: [place]   Lot: [id number]   [place]: [id number]

## 2019-02-10 ENCOUNTER — Ambulatory Visit: Payer: Medicare Other

## 2019-02-13 ENCOUNTER — Ambulatory Visit: Payer: Medicare Other | Attending: Internal Medicine

## 2019-02-13 DIAGNOSIS — Z23 Encounter for immunization: Secondary | ICD-10-CM | POA: Insufficient documentation

## 2019-02-13 NOTE — Progress Notes (Signed)
   Covid-19 Vaccination Clinic  Name:  Timber Lucarelli    MRN: [id number] DOB: 11-28-1930  02/13/2019  Mr. Jipson was observed post Covid-19 immunization for 15 minutes without incidence. He was provided with Vaccine Information Sheet and instruction to access the V-Safe system.   Mr. Hartman was instructed to call 911 with any severe reactions post vaccine: [name] Difficulty breathing  . Swelling of your face and throat  . A fast heartbeat  . A bad rash all over your body  . Dizziness and weakness    Immunizations Administered    Name Date Dose VIS Date Route   Pfizer COVID-19 Vaccine 02/13/2019  8:45 AM 0.3 mL 12/18/2018 Intramuscular   Manufacturer: [place]   Lot: [id number]   [place]: [id number]

## 2019-02-28 ENCOUNTER — Other Ambulatory Visit: Payer: Self-pay | Admitting: Internal Medicine

## 2019-02-28 DIAGNOSIS — I25110 Atherosclerotic heart disease of native coronary artery with unstable angina pectoris: Secondary | ICD-10-CM

## 2019-02-28 MED ORDER — ISOSORBIDE MONONITRATE ER 30 MG PO TB24: 30.0000 mg | ORAL_TABLET | Freq: Every day | ORAL | 1 refills | Status: DC

## 2019-03-03 ENCOUNTER — Other Ambulatory Visit: Payer: Self-pay | Admitting: *Deleted

## 2019-03-03 NOTE — Patient Outreach (Signed)
[facility]) Care Management  [place]  03/03/2019   Robert Clayton 1930-05-26 [id number]  RN Health Coach telephone call to patient.  [employer] compliance verified. Per wife the patient is in bed asleep. RN asked wife what the patient blood sugar is. Wife went to patient and stated he said it was good. Patient did not give numbers. Not sure if patient had actually tested blood sugar yet today because he had not gotten up. Patient has received both COVID vaccines.  Encounter Medications:  Outpatient Encounter Medications as of 03/03/2019  Medication Sig  . acetaminophen (TYLENOL) 325 MG tablet Take 650 mg by mouth every 6 (six) hours as needed.  [name] allopurinol (ZYLOPRIM) 300 MG tablet TAKE 1 TABLET BY MOUTH EVERY DAY  . aspirin EC 81 MG tablet Take 81 mg by mouth daily.  [name] atorvastatin (LIPITOR) 10 MG tablet TAKE 1 TABLET BY MOUTH EVERY DAY  . B-D INS SYR ULTRAFINE 1CC/30G 30G X 1/2" 1 ML MISC USE AS DIRECTED TWICE A DAY (Patient taking differently: 1 each by Other route 3 (three) times daily. )  . hydrochlorothiazide (HYDRODIURIL) 12.5 MG tablet TAKE 1 TABLET BY MOUTH EVERY DAY  . insulin NPH Human (NOVOLIN N RELION) 100 UNIT/ML injection 80 units each morning, and 30 units each evening.  . isosorbide mononitrate (IMDUR) 30 MG 24 hr tablet Take 1 tablet (30 mg total) by mouth daily.  . Lancets (ONETOUCH DELICA PLUS [id number]) MISC TEST 2 TIMES DAILY. DX: E11.51  . metoprolol tartrate (LOPRESSOR) 50 MG tablet TAKE 1 TABLET TWICE A DAY  . nitroGLYCERIN (NITROSTAT) 0.4 MG SL tablet Place 1 tablet (0.4 mg total) under the tongue every 5 (five) minutes as needed. x3 doses as needed for chest pain  . omeprazole (PRILOSEC) 40 MG capsule TAKE 1 CAPSULE BY MOUTH EVERY DAY  . ONETOUCH VERIO test strip USE TO TEST BLOOD SUGAR TWICE A DAY DX E11.51  . telmisartan (MICARDIS) 80 MG tablet TAKE 1 TABLET BY MOUTH EVERY DAY   No facility-administered encounter medications on file as  of 03/03/2019.    Functional Status:  In your present state of health, do you have any difficulty performing the following activities: 08/27/2018 06/30/2018  Hearing? Y N  Vision? - N  Difficulty concentrating or making decisions? - N  Walking or climbing stairs? Y N  Comment - -  Dressing or bathing? N N  Doing errands, shopping? N N  Preparing Food and eating ? N N  Using the Toilet? N N  In the past six months, have you accidently leaked urine? N N  Do you have problems with loss of bowel control? - N  Managing your Medications? N N  Managing your Finances? N N  Housekeeping or managing your Housekeeping? Y N  Comment - -  Some recent data might be hidden    Fall/Depression Screening: Fall Risk  08/27/2018 06/30/2018 05/28/2018  Falls in the past year? 0 0 0  Number falls in past yr: 0 0 -  Injury with Fall? 0 - -  Risk for fall due to : Impaired balance/gait;Impaired mobility (No Data) -  Risk for fall due to: Comment - states he is not as steady as he use to be but has not fallen. -  Follow up Falls evaluation completed Falls prevention discussed -   PHQ 2/9 Scores 06/30/2018 05/28/2018 02/10/2018 04/29/2017 04/24/2017 04/15/2016 02/23/2015  PHQ - 2 Score 0 0 0 0 1 0 0  PHQ- 9 Score - - - -  4 - -   THN CM Care Plan Problem One     Most Recent Value  Care Plan Problem One  Knowledge Deficit in Self Management of Diabtes  Role Documenting the Problem One  [place] for Problem One  Active  [facility] Long Term Goal   Patient will see a decrease in A1C from 8.0 within the next 90 days  [place] Term Goal Start Date  03/03/19  Interventions for Problem One Long Term Goal  Patient A1C hasdecreased from 10.0 to 8.2 to 8.0. Patient is making great progress. RN will follow up with further outreach for progress  THN CM Short Term Goal #1   Patient will verbalize checking blood sugars and documenting within the next 30 days  THN CM Short Term Goal #1 Start Date  03/03/19   Interventions for Short Term Goal #1  Patient wife stated the patient says blood sugar is good. Rn will send a 2021 calendar book and inform patient to write in book so wife can see and give the numbers.  THN CM Short Term Goal #2   Patient will verbalize using safety precautions during the Coronavirus pandemic within the next 30 days  THN CM Short Term Goal #2 Start Date  03/03/19  Interventions for Short Term Goal #2  Patient is using precautions and have received the COVID vaccines, RN will continue to remind them of the 3 W's  THN CM Short Term Goal #3  patient will verbalize understanding the need tofollow up with health maintenance within the next 30 days  THN CM Short Term Goal #3 Start Date  03/03/19  Interventions for Short Tern Goal #3  RN reiterates helth maintenance to wife for patient to follow up on foot care. RN will follow up with further discussions      Assessment:  A1C has decreased from 8.2 to 8.0 Patient has received COVID vaccines Patient will continue to  benefit from [place] telephonic outreach for education and support for diabetes self management.  Plan:  RN sent 2021 Calendar book RN sent a picture sheet of foods high and low in salt Patient will continue to follow pandemic precautions RN will follow up within the month of May  [facility] [place] [facility] [phone]

## 2019-03-05 NOTE — Patient Outreach (Signed)
[facility]) Care Management  11/27/2018 Late entry  Robert Clayton 1930-03-21 [id number]   [place] telephone call to patient.  Per wife patient is still asleep.  Plan: RN will follow up outreach within 30 days  [place] Management [phone]

## 2019-03-10 ENCOUNTER — Other Ambulatory Visit: Payer: Self-pay

## 2019-03-10 DIAGNOSIS — E1151 Type 2 diabetes mellitus with diabetic peripheral angiopathy without gangrene: Secondary | ICD-10-CM

## 2019-03-10 DIAGNOSIS — I129 Hypertensive chronic kidney disease with stage 1 through stage 4 chronic kidney disease, or unspecified chronic kidney disease: Secondary | ICD-10-CM

## 2019-03-10 DIAGNOSIS — IMO0002 Reserved for concepts with insufficient information to code with codable children: Secondary | ICD-10-CM

## 2019-03-10 MED ORDER — TELMISARTAN 80 MG PO TABS: 80.0000 mg | ORAL_TABLET | Freq: Every day | ORAL | 1 refills | Status: DC

## 2019-03-10 MED ORDER — ALLOPURINOL 300 MG PO TABS: 300.0000 mg | ORAL_TABLET | Freq: Every day | ORAL | 1 refills | Status: DC

## 2019-03-10 MED ORDER — OMEPRAZOLE 40 MG PO CPDR: 40.0000 mg | DELAYED_RELEASE_CAPSULE | Freq: Every day | ORAL | 1 refills | Status: DC

## 2019-03-17 ENCOUNTER — Ambulatory Visit (INDEPENDENT_AMBULATORY_CARE_PROVIDER_SITE_OTHER): Payer: Medicare Other | Admitting: Endocrinology

## 2019-03-17 ENCOUNTER — Encounter: Payer: Self-pay | Admitting: Endocrinology

## 2019-03-17 ENCOUNTER — Other Ambulatory Visit: Payer: Self-pay

## 2019-03-17 VITALS — BP 164/80 | HR 80 | Ht 72.0 in | Wt 209.8 lb

## 2019-03-17 DIAGNOSIS — E1151 Type 2 diabetes mellitus with diabetic peripheral angiopathy without gangrene: Secondary | ICD-10-CM

## 2019-03-17 DIAGNOSIS — IMO0002 Reserved for concepts with insufficient information to code with codable children: Secondary | ICD-10-CM

## 2019-03-17 DIAGNOSIS — I25110 Atherosclerotic heart disease of native coronary artery with unstable angina pectoris: Secondary | ICD-10-CM | POA: Diagnosis not present

## 2019-03-17 DIAGNOSIS — D518 Other vitamin B12 deficiency anemias: Secondary | ICD-10-CM

## 2019-03-17 DIAGNOSIS — E1165 Type 2 diabetes mellitus with hyperglycemia: Secondary | ICD-10-CM | POA: Diagnosis not present

## 2019-03-17 LAB — POCT GLYCOSYLATED HEMOGLOBIN (HGB A1C): Hemoglobin A1C: 7.9 % — AB (ref 4.0–5.6)

## 2019-03-17 MED ORDER — CYANOCOBALAMIN 1000 MCG/ML IJ SOLN
1000.0000 ug | Freq: Once | INTRAMUSCULAR | Status: AC
  Administered 2019-03-17: 1000 ug via INTRAMUSCULAR

## 2019-03-17 NOTE — Patient Instructions (Addendum)
Your blood pressure is high today.  Please see your primary care provider soon, to have it rechecked Please continue the same insulin check your blood sugar twice a day.  vary the time of day when you check, between before the 3 meals, and at bedtime.  also check if you have symptoms of your blood sugar being too high or too low.  please keep a record of the readings and bring it to your next appointment here (or you can bring the meter itself).  You can write it on any piece of paper.  please call us sooner if your blood sugar goes below 70, or if you have a lot of readings over 200. Please come back for a follow-up appointment in 3 months.
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
## 2019-03-17 NOTE — Progress Notes (Signed)
Pt presents today for OV with Dr. Loanne Drilling. While in exam room, pt requested an injection of B12. Last B12 labs obtained were 11/03/2017 with results of 1135. Asked Dr. Loanne Drilling to review labs and to advise if appropriate for pt to receive today as requested. Per Dr. Cordelia Pen orders, injection of Cyanocobalamin 1069mcg/mL given IM in R deltoid today by A. Alexias Margerum, LPN. Denies any adverse reactions or discomfort.

## 2019-03-30 ENCOUNTER — Encounter: Payer: Self-pay | Admitting: Internal Medicine

## 2019-03-30 ENCOUNTER — Ambulatory Visit (INDEPENDENT_AMBULATORY_CARE_PROVIDER_SITE_OTHER): Payer: Medicare Other | Admitting: Internal Medicine

## 2019-03-30 ENCOUNTER — Other Ambulatory Visit: Payer: Self-pay

## 2019-03-30 VITALS — BP 162/68 | HR 73 | Temp 98.3°F | Resp 16 | Ht 72.0 in | Wt 208.0 lb

## 2019-03-30 DIAGNOSIS — I10 Essential (primary) hypertension: Secondary | ICD-10-CM | POA: Diagnosis not present

## 2019-03-30 DIAGNOSIS — IMO0002 Reserved for concepts with insufficient information to code with codable children: Secondary | ICD-10-CM

## 2019-03-30 DIAGNOSIS — N183 Chronic kidney disease, stage 3 unspecified: Secondary | ICD-10-CM | POA: Diagnosis not present

## 2019-03-30 DIAGNOSIS — E1165 Type 2 diabetes mellitus with hyperglycemia: Secondary | ICD-10-CM | POA: Diagnosis not present

## 2019-03-30 DIAGNOSIS — D518 Other vitamin B12 deficiency anemias: Secondary | ICD-10-CM

## 2019-03-30 DIAGNOSIS — I25110 Atherosclerotic heart disease of native coronary artery with unstable angina pectoris: Secondary | ICD-10-CM | POA: Diagnosis not present

## 2019-03-30 DIAGNOSIS — E1151 Type 2 diabetes mellitus with diabetic peripheral angiopathy without gangrene: Secondary | ICD-10-CM

## 2019-03-30 LAB — CBC WITH DIFFERENTIAL/PLATELET
Basophils Absolute: 0 10*3/uL (ref 0.0–0.1)
Basophils Relative: 0.4 % (ref 0.0–3.0)
Eosinophils Absolute: 0.2 10*3/uL (ref 0.0–0.7)
Eosinophils Relative: 2.1 % (ref 0.0–5.0)
HCT: 38.3 % — ABNORMAL LOW (ref 39.0–52.0)
Hemoglobin: 12.3 g/dL — ABNORMAL LOW (ref 13.0–17.0)
Lymphocytes Relative: 29.2 % (ref 12.0–46.0)
Lymphs Abs: 2.6 10*3/uL (ref 0.7–4.0)
MCHC: 32.2 g/dL (ref 30.0–36.0)
MCV: 94.8 fl (ref 78.0–100.0)
Monocytes Absolute: 1 10*3/uL (ref 0.1–1.0)
Monocytes Relative: 12 % (ref 3.0–12.0)
Neutro Abs: 4.9 10*3/uL (ref 1.4–7.7)
Neutrophils Relative %: 56.3 % (ref 43.0–77.0)
Platelets: 89 10*3/uL — ABNORMAL LOW (ref 150.0–400.0)
RBC: 4.03 Mil/uL — ABNORMAL LOW (ref 4.22–5.81)
RDW: 17.1 % — ABNORMAL HIGH (ref 11.5–15.5)
WBC: 8.7 10*3/uL (ref 4.0–10.5)

## 2019-03-30 LAB — BASIC METABOLIC PANEL
BUN: 35 mg/dL — ABNORMAL HIGH (ref 6–23)
CO2: 25 mEq/L (ref 19–32)
Calcium: 9.2 mg/dL (ref 8.4–10.5)
Chloride: 114 mEq/L — ABNORMAL HIGH (ref 96–112)
Creatinine, Ser: 2.63 mg/dL — ABNORMAL HIGH (ref 0.40–1.50)
GFR: 28.07 mL/min — ABNORMAL LOW (ref >60.00)
Glucose, Bld: 60 mg/dL — ABNORMAL LOW (ref 70–99)
Potassium: 5 mEq/L (ref 3.5–5.1)
Sodium: 145 mEq/L (ref 135–145)

## 2019-03-30 LAB — VITAMIN B12: Vitamin B-12: 916 pg/mL — ABNORMAL HIGH (ref 211–911)

## 2019-03-30 LAB — FOLATE: Folate: 12.5 ng/mL (ref >5.9)

## 2019-03-30 NOTE — Patient Instructions (Signed)

## 2019-03-30 NOTE — Progress Notes (Signed)
Subjective:  Patient ID: Robert Clayton, male    DOB: [birth year]  Age: 86 y.o. MRN: [id number]  CC: Hypertension and Diabetes  This visit occurred during the SARS-CoV-2 public health emergency.  Safety protocols were in place, including screening questions prior to the visit, additional usage of staff PPE, and extensive cleaning of exam room while observing appropriate contact time as indicated for disinfecting solutions.    HPI Robert Clayton presents for f/up - He is in his usual state of health and tells me his blood pressure and blood sugars have been adequately well controlled.  He denies any recent episodes of headache, blurred vision, chest pain, shortness of breath, palpitations, or polys.  Outpatient Medications Prior to Visit  Medication Sig Dispense Refill  . acetaminophen (TYLENOL) 325 MG tablet Take 650 mg by mouth every 6 (six) hours as needed.    [name] allopurinol (ZYLOPRIM) 300 MG tablet Take 1 tablet (300 mg total) by mouth daily. 90 tablet 1  . aspirin EC 81 MG tablet Take 81 mg by mouth daily.    [name] atorvastatin (LIPITOR) 10 MG tablet TAKE 1 TABLET BY MOUTH EVERY DAY 90 tablet 1  . B-D INS SYR ULTRAFINE 1CC/30G 30G X 1/2" 1 ML MISC USE AS DIRECTED TWICE A DAY (Patient taking differently: 1 each by Other route 3 (three) times daily. ) 200 each 3  . hydrochlorothiazide (HYDRODIURIL) 12.5 MG tablet TAKE 1 TABLET BY MOUTH EVERY DAY 90 tablet 1  . insulin NPH Human (NOVOLIN N RELION) 100 UNIT/ML injection 80 units each morning, and 30 units each evening. 90 mL 3  . isosorbide mononitrate (IMDUR) 30 MG 24 hr tablet Take 1 tablet (30 mg total) by mouth daily. 90 tablet 1  . Lancets (ONETOUCH DELICA PLUS [id number]) MISC TEST 2 TIMES DAILY. DX: E11.51 100 each 0  . metoprolol tartrate (LOPRESSOR) 50 MG tablet TAKE 1 TABLET TWICE A DAY 180 tablet 1  . nitroGLYCERIN (NITROSTAT) 0.4 MG SL tablet Place 1 tablet (0.4 mg total) under the tongue every 5 (five) minutes as needed. x3 doses as  needed for chest pain 30 tablet 0  . omeprazole (PRILOSEC) 40 MG capsule Take 1 capsule (40 mg total) by mouth daily. 90 capsule 1  . ONETOUCH VERIO test strip USE TO TEST BLOOD SUGAR TWICE A DAY DX E11.51 200 strip 3  . telmisartan (MICARDIS) 80 MG tablet Take 1 tablet (80 mg total) by mouth daily. 90 tablet 1   No facility-administered medications prior to visit.    ROS Review of Systems  Constitutional: Negative for diaphoresis and fatigue.  HENT: Negative.   Eyes: Negative.   Respiratory: Negative for cough, chest tightness, shortness of breath and wheezing.   Cardiovascular: Negative for chest pain, palpitations and leg swelling.  Gastrointestinal: Negative for abdominal pain, diarrhea, nausea and vomiting.  Endocrine: Negative.  Negative for polydipsia, polyphagia and polyuria.  Genitourinary: Negative.  Negative for difficulty urinating.  Musculoskeletal: Negative for arthralgias and myalgias.  Skin: Negative.  Negative for color change.  Neurological: Negative.  Negative for dizziness, weakness and light-headedness.  Hematological: Negative for adenopathy. Does not bruise/bleed easily.  Psychiatric/Behavioral: Negative.     Objective:  BP (!) 162/68 (BP Location: Left Arm, Patient Position: Sitting, Cuff Size: Large)   Pulse 73   Temp 98.3 F (36.8 C) (Oral)   Resp 16   Ht 6' (1.829 m)   Wt 208 lb (94.3 kg)   SpO2 98%   BMI 28.21 kg/m   BP  Readings from Last 3 Encounters:  03/30/19 (!) 162/68  03/17/19 (!) 164/80  01/12/19 (!) 180/80    Wt Readings from Last 3 Encounters:  03/30/19 208 lb (94.3 kg)  03/17/19 209 lb 12.8 oz (95.2 kg)  01/12/19 209 lb 9.6 oz (95.1 kg)    Physical Exam Vitals reviewed.  Constitutional:      Appearance: Normal appearance.  HENT:     Nose: Nose normal.     Mouth/Throat:     Mouth: Mucous membranes are moist.  Eyes:     General: No scleral icterus.    Conjunctiva/sclera: Conjunctivae normal.  Cardiovascular:     Rate and  Rhythm: Normal rate and regular rhythm.     Heart sounds: No murmur.  Pulmonary:     Effort: Pulmonary effort is normal.     Breath sounds: No stridor. No wheezing, rhonchi or rales.  Abdominal:     General: Abdomen is flat. Bowel sounds are normal. There is no distension.     Palpations: Abdomen is soft. There is no hepatomegaly, splenomegaly or mass.  Musculoskeletal:        General: Normal range of motion.     Cervical back: Neck supple.     Right lower leg: No edema.     Left lower leg: No edema.  Lymphadenopathy:     Cervical: No cervical adenopathy.  Skin:    General: Skin is warm and dry.     Coloration: Skin is not pale.  Neurological:     General: No focal deficit present.     Mental Status: He is alert.     Lab Results  Component Value Date   WBC 8.7 03/30/2019   HGB 12.3 (L) 03/30/2019   HCT 38.3 (L) 03/30/2019   PLT 89.0 (L) 03/30/2019   GLUCOSE 60 (L) 03/30/2019   CHOL 108 09/30/2018   TRIG 148.0 09/30/2018   HDL 30.90 (L) 09/30/2018   LDLDIRECT 80.6 06/26/2010   LDLCALC 48 09/30/2018   ALT 13 03/30/2018   AST 16 03/30/2018   NA 145 03/30/2019   K 5.0 03/30/2019   CL 114 (H) 03/30/2019   CREATININE 2.63 (H) 03/30/2019   BUN 35 (H) 03/30/2019   CO2 25 03/30/2019   TSH 1.45 09/30/2018   PSA 0.03 (L) 06/29/2009   INR 1.1 04/08/2008   HGBA1C 7.9 (A) 03/17/2019   MICROALBUR 1.8 09/30/2018    CT Head Wo Contrast  Result Date: 11/04/2017 CLINICAL DATA:  Acute headache, worst headache of life, right-sided, prior subdural hematoma years ago EXAM: CT HEAD WITHOUT CONTRAST TECHNIQUE: Contiguous axial images were obtained from the base of the skull through the vertex without intravenous contrast. COMPARISON:  CT brain scan of 08/29/2016 FINDINGS: Brain: The ventricular system remains prominent as are the cortical sulci indicative of diffuse atrophy. The septum is midline in position. Mild small vessel ischemic change remains throughout the periventricular white  matter. No hemorrhage, mass lesion, or acute infarction is seen. Vascular: No vascular abnormality is evident on this unenhanced study. Skull: Changes of right parietal craniotomy again are noted. No complicating features are seen. Sinuses/Orbits: The paranasal sinuses appear well pneumatized. Other: None. IMPRESSION: 1. No change in atrophy and mild small vessel ischemic change in the periventricular white matter. 2. Stable right parietal craniotomy. Electronically Signed   By: Ivar Drape M.D.   On: 11/04/2017 10:24    Assessment & Plan:   Gilmar was seen today for hypertension and diabetes.  Diagnoses and all orders for this  visit:  Essential hypertension- His BP is adequately well controlled. -     CBC with Differential/Platelet; Future -     Basic metabolic panel; Future -     Basic metabolic panel -     CBC with Differential/Platelet   ANEMIA, B12 DEFICIENCY- Improvement noted -     CBC with Differential/Platelet; Future -     Vitamin B12; Future -     Folate; Future -     Folate -     Vitamin B12 -     CBC with Differential/Platelet  CRI (chronic renal insufficiency), stage 3 (moderate)- His renal function is stable.  DM (diabetes mellitus), type 2, uncontrolled, periph vascular complic ([place])- He has achieved adequate glycemia control.   I am having Inetta Fermo maintain his nitroGLYCERIN, acetaminophen, B-D INS SYR ULTRAFINE 1CC/30G, aspirin EC, hydrochlorothiazide, metoprolol tartrate, OneTouch Verio, atorvastatin, OneTouch Delica Plus [id number], insulin NPH Human, isosorbide mononitrate, telmisartan, omeprazole, and allopurinol.  No orders of the defined types were placed in this encounter.    Follow-up: Return in about 6 months (around 09/30/2019).  [name], MD

## 2019-03-31 ENCOUNTER — Encounter: Payer: Self-pay | Admitting: Internal Medicine

## 2019-04-09 ENCOUNTER — Other Ambulatory Visit: Payer: Self-pay | Admitting: Internal Medicine

## 2019-04-09 DIAGNOSIS — I129 Hypertensive chronic kidney disease with stage 1 through stage 4 chronic kidney disease, or unspecified chronic kidney disease: Secondary | ICD-10-CM

## 2019-04-14 ENCOUNTER — Other Ambulatory Visit: Payer: Self-pay

## 2019-04-14 ENCOUNTER — Encounter: Payer: Self-pay | Admitting: Internal Medicine

## 2019-04-14 ENCOUNTER — Ambulatory Visit (INDEPENDENT_AMBULATORY_CARE_PROVIDER_SITE_OTHER): Payer: Medicare Other | Admitting: Internal Medicine

## 2019-04-14 VITALS — BP 138/50 | HR 78 | Temp 98.1°F | Resp 16 | Ht 72.0 in | Wt 200.4 lb

## 2019-04-14 DIAGNOSIS — R300 Dysuria: Secondary | ICD-10-CM | POA: Insufficient documentation

## 2019-04-14 DIAGNOSIS — N3000 Acute cystitis without hematuria: Secondary | ICD-10-CM | POA: Diagnosis not present

## 2019-04-14 DIAGNOSIS — I25110 Atherosclerotic heart disease of native coronary artery with unstable angina pectoris: Secondary | ICD-10-CM | POA: Diagnosis not present

## 2019-04-14 LAB — POC URINALSYSI DIPSTICK (AUTOMATED)
Bilirubin, UA: NEGATIVE
Blood, UA: POSITIVE
Glucose, UA: NEGATIVE
Ketones, UA: NEGATIVE
Nitrite, UA: NEGATIVE
Protein, UA: POSITIVE — AB
Spec Grav, UA: 1.015 (ref 1.010–1.025)
Urobilinogen, UA: 0.2 E.U./dL
pH, UA: 6 (ref 5.0–8.0)

## 2019-04-14 MED ORDER — SULFAMETHOXAZOLE-TRIMETHOPRIM 800-160 MG PO TABS: 1.0000 | ORAL_TABLET | Freq: Two times a day (BID) | ORAL | 0 refills | Status: AC

## 2019-04-14 NOTE — Progress Notes (Signed)
Subjective:  Patient ID: Robert Clayton, male    DOB: 1930/08/07  Age: 86 y.o. MRN: [id number]  CC: Urinary Tract Infection  This visit occurred during the SARS-CoV-2 public health emergency.  Safety protocols were in place, including screening questions prior to the visit, additional usage of staff PPE, and extensive cleaning of exam room while observing appropriate contact time as indicated for disinfecting solutions.    HPI Atthew Coutant presents for the complaint of 3 day history of dysuria and penile discharge.  Outpatient Medications Prior to Visit  Medication Sig Dispense Refill  . acetaminophen (TYLENOL) 325 MG tablet Take 650 mg by mouth every 6 (six) hours as needed.    [name] allopurinol (ZYLOPRIM) 300 MG tablet Take 1 tablet (300 mg total) by mouth daily. 90 tablet 1  . aspirin EC 81 MG tablet Take 81 mg by mouth daily.    [name] atorvastatin (LIPITOR) 10 MG tablet TAKE 1 TABLET BY MOUTH EVERY DAY 90 tablet 1  . B-D INS SYR ULTRAFINE 1CC/30G 30G X 1/2" 1 ML MISC USE AS DIRECTED TWICE A DAY (Patient taking differently: 1 each by Other route 3 (three) times daily. ) 200 each 3  . hydrochlorothiazide (HYDRODIURIL) 12.5 MG tablet TAKE 1 TABLET BY MOUTH EVERY DAY 90 tablet 1  . insulin NPH Human (NOVOLIN N RELION) 100 UNIT/ML injection 80 units each morning, and 30 units each evening. 90 mL 3  . isosorbide mononitrate (IMDUR) 30 MG 24 hr tablet Take 1 tablet (30 mg total) by mouth daily. 90 tablet 1  . Lancets (ONETOUCH DELICA PLUS [id number]) MISC TEST 2 TIMES DAILY. DX: E11.51 100 each 0  . metoprolol tartrate (LOPRESSOR) 50 MG tablet TAKE 1 TABLET TWICE A DAY 180 tablet 1  . nitroGLYCERIN (NITROSTAT) 0.4 MG SL tablet Place 1 tablet (0.4 mg total) under the tongue every 5 (five) minutes as needed. x3 doses as needed for chest pain 30 tablet 0  . omeprazole (PRILOSEC) 40 MG capsule Take 1 capsule (40 mg total) by mouth daily. 90 capsule 1  . ONETOUCH VERIO test strip USE TO TEST BLOOD SUGAR  TWICE A DAY DX E11.51 200 strip 3  . telmisartan (MICARDIS) 80 MG tablet Take 1 tablet (80 mg total) by mouth daily. 90 tablet 1   No facility-administered medications prior to visit.    ROS Review of Systems  Constitutional: Negative for chills, diaphoresis, fatigue and fever.  HENT: Negative.   Eyes: Negative for visual disturbance.  Respiratory: Negative for cough and shortness of breath.   Cardiovascular: Negative.  Negative for leg swelling.  Gastrointestinal: Negative for abdominal pain, diarrhea, nausea and vomiting.  Genitourinary: Positive for discharge and dysuria. Negative for decreased urine volume, difficulty urinating, frequency, genital sores, hematuria, penile pain, penile swelling, scrotal swelling, testicular pain and urgency.  Musculoskeletal: Negative.  Negative for myalgias.  Skin: Negative for rash.  Neurological: Negative.  Negative for dizziness, weakness and light-headedness.  Hematological: Negative for adenopathy. Does not bruise/bleed easily.  Psychiatric/Behavioral: Negative.     Objective:  BP (!) 138/50 (BP Location: Left Arm, Patient Position: Sitting, Cuff Size: Large)   Pulse 78   Temp 98.1 F (36.7 C) (Oral)   Resp 16   Ht 6' (1.829 m)   Wt 200 lb 6 oz (90.9 kg)   SpO2 97%   BMI 27.18 kg/m   BP Readings from Last 3 Encounters:  04/14/19 (!) 138/50  03/30/19 (!) 162/68  03/17/19 (!) 164/80    Wt Readings from  Last 3 Encounters:  04/14/19 200 lb 6 oz (90.9 kg)  03/30/19 208 lb (94.3 kg)  03/17/19 209 lb 12.8 oz (95.2 kg)    Physical Exam Vitals reviewed. Exam conducted with a chaperone present (his wife).  Constitutional:      Appearance: Normal appearance. He is not ill-appearing.  HENT:     Mouth/Throat:     Mouth: Mucous membranes are moist.  Eyes:     General: No scleral icterus.    Conjunctiva/sclera: Conjunctivae normal.  Cardiovascular:     Rate and Rhythm: Normal rate and regular rhythm.     Heart sounds: Murmur  present.  Pulmonary:     Breath sounds: No stridor. No wheezing, rhonchi or rales.  Abdominal:     General: Abdomen is flat. Bowel sounds are normal. There is no distension.     Palpations: Abdomen is soft. There is no hepatomegaly, splenomegaly or mass.     Tenderness: There is no abdominal tenderness.     Hernia: No hernia is present.  Genitourinary:    Pubic Area: No rash.      Penis: Normal and uncircumcised. No phimosis, paraphimosis, hypospadias, erythema, tenderness, discharge, swelling or lesions.      Testes: Normal.        Right: Mass, tenderness or swelling not present.        Left: Tenderness or swelling not present.     Epididymis:     Right: Not inflamed or enlarged. No mass or tenderness.     Left: Not inflamed or enlarged. No mass or tenderness.     Prostate: Normal. Not enlarged and no nodules present.     Rectum: Guaiac result negative. External hemorrhoid and internal hemorrhoid present. No mass, tenderness or anal fissure. Normal anal tone.  Musculoskeletal:        General: Normal range of motion.     Cervical back: Neck supple.     Right lower leg: No edema.     Left lower leg: No edema.  Lymphadenopathy:     Cervical: No cervical adenopathy.     Lower Body: No right inguinal adenopathy. No left inguinal adenopathy.  Skin:    General: Skin is warm and dry.     Coloration: Skin is not pale.     Findings: No rash.  Neurological:     General: No focal deficit present.     Mental Status: He is alert.     Lab Results  Component Value Date   WBC 8.7 03/30/2019   HGB 12.3 (L) 03/30/2019   HCT 38.3 (L) 03/30/2019   PLT 89.0 (L) 03/30/2019   GLUCOSE 60 (L) 03/30/2019   CHOL 108 09/30/2018   TRIG 148.0 09/30/2018   HDL 30.90 (L) 09/30/2018   LDLDIRECT 80.6 06/26/2010   LDLCALC 48 09/30/2018   ALT 13 03/30/2018   AST 16 03/30/2018   NA 145 03/30/2019   K 5.0 03/30/2019   CL 114 (H) 03/30/2019   CREATININE 2.63 (H) 03/30/2019   BUN 35 (H) 03/30/2019    CO2 25 03/30/2019   TSH 1.45 09/30/2018   PSA 0.03 (L) 06/29/2009   INR 1.1 04/08/2008   HGBA1C 7.9 (A) 03/17/2019   MICROALBUR 1.8 09/30/2018    CT Head Wo Contrast  Result Date: 11/04/2017 CLINICAL DATA:  Acute headache, worst headache of life, right-sided, prior subdural hematoma years ago EXAM: CT HEAD WITHOUT CONTRAST TECHNIQUE: Contiguous axial images were obtained from the base of the skull through the vertex without intravenous contrast.  COMPARISON:  CT brain scan of 08/29/2016 FINDINGS: Brain: The ventricular system remains prominent as are the cortical sulci indicative of diffuse atrophy. The septum is midline in position. Mild small vessel ischemic change remains throughout the periventricular white matter. No hemorrhage, mass lesion, or acute infarction is seen. Vascular: No vascular abnormality is evident on this unenhanced study. Skull: Changes of right parietal craniotomy again are noted. No complicating features are seen. Sinuses/Orbits: The paranasal sinuses appear well pneumatized. Other: None. IMPRESSION: 1. No change in atrophy and mild small vessel ischemic change in the periventricular white matter. 2. Stable right parietal craniotomy. Electronically Signed   By: Ivar Drape M.D.   On: 11/04/2017 10:24  yellow           Clarity, UA  cloudy         Glucose, UA Negative Negative         Bilirubin, UA  Negative         Ketones, UA  Negative         Spec Grav, UA 1.010 - 1.025 1.015         Blood, UA  Positive         Comment: 80 mg/dL  pH, UA 5.0 - 8.0 6.0         Protein, UA Negative PositiveAbnormal          Comment: 30 mg/dL  Urobilinogen, UA 0.2 or 1.0 E.U./dL 0.2  0.2 R  0.2 R  0.2 R  0.2 R  0.2 R  1.0 R   Nitrite, UA  Negative         Leukocytes, UA Negative Large (3+)Abnormal              Assessment & Plan:   Melchor was seen today for urinary tract infection.  Diagnoses and all orders for this visit:  Dysuria -     Cancel: CULTURE, URINE COMPREHENSIVE;  Future -     POCT Urinalysis Dipstick (Automated) -     CULTURE, URINE COMPREHENSIVE; Future  Acute cystitis without hematuria- He has a 3-day history of symptoms.  There is no evidence of prostatitis or pyelonephritis.  A urine culture has been sent.  His UA is positive for red blood cells and white blood cells.  I will treat empirically with Bactrim DS. -     sulfamethoxazole-trimethoprim (BACTRIM DS) 800-160 MG tablet; Take 1 tablet by mouth 2 (two) times daily for 7 days.   I am having Inetta Fermo start on sulfamethoxazole-trimethoprim. I am also having him maintain his nitroGLYCERIN, acetaminophen, B-D INS SYR ULTRAFINE 1CC/30G, aspirin EC, metoprolol tartrate, OneTouch Verio, atorvastatin, OneTouch Delica Plus [id number], insulin NPH Human, isosorbide mononitrate, telmisartan, omeprazole, allopurinol, and hydrochlorothiazide.  Meds ordered this encounter  Medications  . sulfamethoxazole-trimethoprim (BACTRIM DS) 800-160 MG tablet    Sig: Take 1 tablet by mouth 2 (two) times daily for 7 days.    Dispense:  14 tablet    Refill:  0     Follow-up: Return in about 3 weeks (around 05/05/2019).  [name], MD

## 2019-04-14 NOTE — Patient Instructions (Signed)

## 2019-04-14 NOTE — Addendum Note (Signed)
Addended by: Trenda Moots on: 03/13/163 12:21 PM   Modules accepted: Orders

## 2019-04-17 LAB — CULTURE, URINE COMPREHENSIVE

## 2019-04-20 ENCOUNTER — Ambulatory Visit: Payer: Medicare Other

## 2019-05-05 ENCOUNTER — Encounter: Payer: Self-pay | Admitting: Internal Medicine

## 2019-05-05 ENCOUNTER — Other Ambulatory Visit: Payer: Self-pay

## 2019-05-05 ENCOUNTER — Ambulatory Visit (INDEPENDENT_AMBULATORY_CARE_PROVIDER_SITE_OTHER): Payer: Medicare Other | Admitting: Internal Medicine

## 2019-05-05 VITALS — BP 144/64 | HR 67 | Temp 98.3°F | Resp 16 | Ht 72.0 in | Wt 200.0 lb

## 2019-05-05 DIAGNOSIS — I25110 Atherosclerotic heart disease of native coronary artery with unstable angina pectoris: Secondary | ICD-10-CM

## 2019-05-05 DIAGNOSIS — B9620 Unspecified Escherichia coli [E. coli] as the cause of diseases classified elsewhere: Secondary | ICD-10-CM | POA: Insufficient documentation

## 2019-05-05 DIAGNOSIS — N390 Urinary tract infection, site not specified: Secondary | ICD-10-CM

## 2019-05-05 DIAGNOSIS — D518 Other vitamin B12 deficiency anemias: Secondary | ICD-10-CM

## 2019-05-05 MED ORDER — CYANOCOBALAMIN 1000 MCG/ML IJ SOLN: 1000.0000 ug | Freq: Once | INTRAMUSCULAR | 0 refills | Status: DC

## 2019-05-05 MED ORDER — CYANOCOBALAMIN 1000 MCG/ML IJ SOLN
1000.0000 ug | Freq: Once | INTRAMUSCULAR | Status: AC
  Administered 2019-05-05: 1000 ug via INTRAMUSCULAR

## 2019-05-05 NOTE — Progress Notes (Signed)
Subjective:  Patient ID: Robert Clayton, male    DOB: [birth year]  Age: 86 y.o. MRN: [id number]  CC: Urinary Tract Infection  This visit occurred during the SARS-CoV-2 public health emergency.  Safety protocols were in place, including screening questions prior to the visit, additional usage of staff PPE, and extensive cleaning of exam room while observing appropriate contact time as indicated for disinfecting solutions.    HPI Erving Sassano presents for f/up - He was seen here about 3 weeks ago with a complaint of dysuria.  His urine culture was positive for E. coli and Klebsiella.  He has completed a course of Bactrim DS and says that all of his symptoms have resolved resolved.  Outpatient Medications Prior to Visit  Medication Sig Dispense Refill  . acetaminophen (TYLENOL) 325 MG tablet Take 650 mg by mouth every 6 (six) hours as needed.    [name] allopurinol (ZYLOPRIM) 300 MG tablet Take 1 tablet (300 mg total) by mouth daily. 90 tablet 1  . aspirin EC 81 MG tablet Take 81 mg by mouth daily.    [name] atorvastatin (LIPITOR) 10 MG tablet TAKE 1 TABLET BY MOUTH EVERY DAY 90 tablet 1  . B-D INS SYR ULTRAFINE 1CC/30G 30G X 1/2" 1 ML MISC USE AS DIRECTED TWICE A DAY (Patient taking differently: 1 each by Other route 3 (three) times daily. ) 200 each 3  . hydrochlorothiazide (HYDRODIURIL) 12.5 MG tablet TAKE 1 TABLET BY MOUTH EVERY DAY 90 tablet 1  . insulin NPH Human (NOVOLIN N RELION) 100 UNIT/ML injection 80 units each morning, and 30 units each evening. 90 mL 3  . isosorbide mononitrate (IMDUR) 30 MG 24 hr tablet Take 1 tablet (30 mg total) by mouth daily. 90 tablet 1  . Lancets (ONETOUCH DELICA PLUS [id number]) MISC TEST 2 TIMES DAILY. DX: E11.51 100 each 0  . metoprolol tartrate (LOPRESSOR) 50 MG tablet TAKE 1 TABLET TWICE A DAY 180 tablet 1  . nitroGLYCERIN (NITROSTAT) 0.4 MG SL tablet Place 1 tablet (0.4 mg total) under the tongue every 5 (five) minutes as needed. x3 doses as needed for chest pain  30 tablet 0  . omeprazole (PRILOSEC) 40 MG capsule Take 1 capsule (40 mg total) by mouth daily. 90 capsule 1  . ONETOUCH VERIO test strip USE TO TEST BLOOD SUGAR TWICE A DAY DX E11.51 200 strip 3  . telmisartan (MICARDIS) 80 MG tablet Take 1 tablet (80 mg total) by mouth daily. 90 tablet 1   No facility-administered medications prior to visit.    ROS Review of Systems  Constitutional: Negative for chills, fatigue and fever.  HENT: Negative.   Eyes: Negative for visual disturbance.  Respiratory: Negative for cough, chest tightness, shortness of breath and wheezing.   Cardiovascular: Negative for chest pain, palpitations and leg swelling.  Gastrointestinal: Negative for abdominal pain, diarrhea, nausea and vomiting.  Endocrine: Negative.   Genitourinary: Negative for difficulty urinating, dysuria, flank pain, frequency, hematuria and urgency.  Musculoskeletal: Negative.   Skin: Negative.   Neurological: Negative.  Negative for dizziness and weakness.  Hematological: Negative.   Psychiatric/Behavioral: Negative.     Objective:  BP (!) 144/64 (BP Location: Left Arm, Patient Position: Sitting, Cuff Size: Normal)   Pulse 67   Temp 98.3 F (36.8 C) (Oral)   Resp 16   Ht 6' (1.829 m)   Wt 200 lb (90.7 kg)   SpO2 97%   BMI 27.12 kg/m   BP Readings from Last 3 Encounters:  05/05/19 [name])  144/64  04/14/19 (!) 138/50  03/30/19 (!) 162/68    Wt Readings from Last 3 Encounters:  05/05/19 200 lb (90.7 kg)  04/14/19 200 lb 6 oz (90.9 kg)  03/30/19 208 lb (94.3 kg)    Physical Exam Vitals reviewed.  Constitutional:      Appearance: Normal appearance.  HENT:     Nose: Nose normal.     Mouth/Throat:     Mouth: Mucous membranes are moist.  Eyes:     General: No scleral icterus.    Conjunctiva/sclera: Conjunctivae normal.  Cardiovascular:     Rate and Rhythm: Normal rate and regular rhythm.     Heart sounds: Murmur present. Systolic murmur present with a grade of 2/6. No  gallop.   Pulmonary:     Effort: Pulmonary effort is normal.     Breath sounds: No stridor. No wheezing, rhonchi or rales.  Abdominal:     General: Abdomen is flat. Bowel sounds are normal. There is no distension.     Palpations: Abdomen is soft. There is no hepatomegaly, splenomegaly or mass.     Tenderness: There is no abdominal tenderness.  Musculoskeletal:        General: Normal range of motion.     Cervical back: Neck supple.     Right lower leg: No edema.     Left lower leg: No edema.  Lymphadenopathy:     Cervical: No cervical adenopathy.  Skin:    General: Skin is warm and dry.  Neurological:     General: No focal deficit present.     Mental Status: He is alert and oriented to person, place, and time.     Lab Results  Component Value Date   WBC 8.7 03/30/2019   HGB 12.3 (L) 03/30/2019   HCT 38.3 (L) 03/30/2019   PLT 89.0 (L) 03/30/2019   GLUCOSE 60 (L) 03/30/2019   CHOL 108 09/30/2018   TRIG 148.0 09/30/2018   HDL 30.90 (L) 09/30/2018   LDLDIRECT 80.6 06/26/2010   LDLCALC 48 09/30/2018   ALT 13 03/30/2018   AST 16 03/30/2018   NA 145 03/30/2019   K 5.0 03/30/2019   CL 114 (H) 03/30/2019   CREATININE 2.63 (H) 03/30/2019   BUN 35 (H) 03/30/2019   CO2 25 03/30/2019   TSH 1.45 09/30/2018   PSA 0.03 (L) 06/29/2009   INR 1.1 04/08/2008   HGBA1C 7.9 (A) 03/17/2019   MICROALBUR 1.8 09/30/2018    CT Head Wo Contrast  Result Date: 11/04/2017 CLINICAL DATA:  Acute headache, worst headache of life, right-sided, prior subdural hematoma years ago EXAM: CT HEAD WITHOUT CONTRAST TECHNIQUE: Contiguous axial images were obtained from the base of the skull through the vertex without intravenous contrast. COMPARISON:  CT brain scan of 08/29/2016 FINDINGS: Brain: The ventricular system remains prominent as are the cortical sulci indicative of diffuse atrophy. The septum is midline in position. Mild small vessel ischemic change remains throughout the periventricular white  matter. No hemorrhage, mass lesion, or acute infarction is seen. Vascular: No vascular abnormality is evident on this unenhanced study. Skull: Changes of right parietal craniotomy again are noted. No complicating features are seen. Sinuses/Orbits: The paranasal sinuses appear well pneumatized. Other: None. IMPRESSION: 1. No change in atrophy and mild small vessel ischemic change in the periventricular white matter. 2. Stable right parietal craniotomy. Electronically Signed   By: Ivar Drape M.D.   On: 11/04/2017 10:24    Assessment & Plan:   Davin was seen today for urinary  tract infection.  Diagnoses and all orders for this visit:  ANEMIA, B12 DEFICIENCY -     Discontinue: cyanocobalamin (,VITAMIN B-12,) 1000 MCG/ML injection; Inject 1 mL (1,000 mcg total) into the muscle once for 1 dose. -     cyanocobalamin ((VITAMIN B-12)) injection 1,000 mcg  E. coli UTI (urinary tract infection)- His symptoms have resolved and his urinalysis is normal.  I have ordered a repeat urine culture to be certain that all the infection has been eradicated. -     Urinalysis, Routine w reflex microscopic; Future -     CULTURE, URINE COMPREHENSIVE; Future -     CULTURE, URINE COMPREHENSIVE -     Urinalysis, Routine w reflex microscopic   I have discontinued Mancel Montelongo's cyanocobalamin. I am also having him maintain his nitroGLYCERIN, acetaminophen, B-D INS SYR ULTRAFINE 1CC/30G, aspirin EC, metoprolol tartrate, OneTouch Verio, atorvastatin, OneTouch Delica Plus [id number], insulin NPH Human, isosorbide mononitrate, telmisartan, omeprazole, allopurinol, and hydrochlorothiazide. We administered cyanocobalamin.  Meds ordered this encounter  Medications  . DISCONTD: cyanocobalamin (,VITAMIN B-12,) 1000 MCG/ML injection    Sig: Inject 1 mL (1,000 mcg total) into the muscle once for 1 dose.    Dispense:  1 mL    Refill:  0  . cyanocobalamin ((VITAMIN B-12)) injection 1,000 mcg     Follow-up: No follow-ups on  file.  [name], MD

## 2019-05-06 ENCOUNTER — Encounter: Payer: Self-pay | Admitting: Internal Medicine

## 2019-05-06 LAB — URINALYSIS, ROUTINE W REFLEX MICROSCOPIC
Bilirubin Urine: NEGATIVE
Hgb urine dipstick: NEGATIVE
Ketones, ur: NEGATIVE
Leukocytes,Ua: NEGATIVE
Nitrite: NEGATIVE
RBC / HPF: NONE SEEN (ref 0–?)
Specific Gravity, Urine: 1.02 (ref 1.000–1.030)
Total Protein, Urine: NEGATIVE
Urine Glucose: NEGATIVE
Urobilinogen, UA: 0.2 (ref 0.0–1.0)
pH: 5.5 (ref 5.0–8.0)

## 2019-05-06 NOTE — Patient Instructions (Signed)

## 2019-05-07 LAB — CULTURE, URINE COMPREHENSIVE: RESULT:: NO GROWTH

## 2019-05-19 ENCOUNTER — Ambulatory Visit: Payer: Self-pay | Admitting: *Deleted

## 2019-06-06 ENCOUNTER — Other Ambulatory Visit: Payer: Self-pay | Admitting: Internal Medicine

## 2019-06-06 DIAGNOSIS — I2510 Atherosclerotic heart disease of native coronary artery without angina pectoris: Secondary | ICD-10-CM

## 2019-06-06 DIAGNOSIS — E785 Hyperlipidemia, unspecified: Secondary | ICD-10-CM

## 2019-06-06 DIAGNOSIS — IMO0002 Reserved for concepts with insufficient information to code with codable children: Secondary | ICD-10-CM

## 2019-06-09 ENCOUNTER — Other Ambulatory Visit: Payer: Self-pay

## 2019-06-09 ENCOUNTER — Ambulatory Visit (INDEPENDENT_AMBULATORY_CARE_PROVIDER_SITE_OTHER): Payer: Medicare Other | Admitting: *Deleted

## 2019-06-09 DIAGNOSIS — E538 Deficiency of other specified B group vitamins: Secondary | ICD-10-CM

## 2019-06-09 MED ORDER — CYANOCOBALAMIN 1000 MCG/ML IJ SOLN
1000.0000 ug | Freq: Once | INTRAMUSCULAR | Status: AC
  Administered 2019-06-09: 1000 ug via INTRAMUSCULAR

## 2019-06-09 NOTE — Progress Notes (Signed)
Pls cosign for B12 inj../lmb  

## 2019-06-29 ENCOUNTER — Ambulatory Visit (INDEPENDENT_AMBULATORY_CARE_PROVIDER_SITE_OTHER): Payer: Medicare Other | Admitting: Endocrinology

## 2019-06-29 ENCOUNTER — Other Ambulatory Visit: Payer: Self-pay

## 2019-06-29 ENCOUNTER — Encounter: Payer: Self-pay | Admitting: Endocrinology

## 2019-06-29 VITALS — BP 118/64 | HR 73 | Ht 72.0 in | Wt 200.0 lb

## 2019-06-29 DIAGNOSIS — E1165 Type 2 diabetes mellitus with hyperglycemia: Secondary | ICD-10-CM | POA: Diagnosis not present

## 2019-06-29 DIAGNOSIS — IMO0002 Reserved for concepts with insufficient information to code with codable children: Secondary | ICD-10-CM

## 2019-06-29 DIAGNOSIS — I25110 Atherosclerotic heart disease of native coronary artery with unstable angina pectoris: Secondary | ICD-10-CM | POA: Diagnosis not present

## 2019-06-29 DIAGNOSIS — E1151 Type 2 diabetes mellitus with diabetic peripheral angiopathy without gangrene: Secondary | ICD-10-CM | POA: Diagnosis not present

## 2019-06-29 LAB — POCT GLYCOSYLATED HEMOGLOBIN (HGB A1C): Hemoglobin A1C: 7.9 % — AB (ref 4.0–5.6)
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
## 2019-06-29 NOTE — Patient Instructions (Addendum)
Please continue the same insulin.   check your blood sugar twice a day.  vary the time of day when you check, between before the 3 meals, and at bedtime.  also check if you have symptoms of your blood sugar being too high or too low.  please keep a record of the readings and bring it to your next appointment here (or you can bring the meter itself).  You can write it on any piece of paper.  please call us sooner if your blood sugar goes below 70, or if you have a lot of readings over 200.  Please come back for a follow-up appointment in 3 months.     

## 2019-07-01 ENCOUNTER — Ambulatory Visit: Payer: Medicare Other

## 2019-07-09 ENCOUNTER — Ambulatory Visit: Payer: Medicare Other

## 2019-07-21 ENCOUNTER — Other Ambulatory Visit: Payer: Self-pay

## 2019-07-21 ENCOUNTER — Ambulatory Visit (INDEPENDENT_AMBULATORY_CARE_PROVIDER_SITE_OTHER): Payer: Medicare Other | Admitting: *Deleted

## 2019-07-21 DIAGNOSIS — E538 Deficiency of other specified B group vitamins: Secondary | ICD-10-CM

## 2019-07-21 MED ORDER — CYANOCOBALAMIN 1000 MCG/ML IJ SOLN
1000.0000 ug | Freq: Once | INTRAMUSCULAR | Status: AC
  Administered 2019-07-21: 1000 ug via INTRAMUSCULAR

## 2019-07-21 NOTE — Progress Notes (Signed)
Pls cosign for B12 inj../lmb  

## 2019-07-26 ENCOUNTER — Other Ambulatory Visit: Payer: Self-pay | Admitting: *Deleted

## 2019-07-26 NOTE — Patient Outreach (Signed)
[facility]) Care Management  [place]  07/26/2019   Robert Clayton 1930/08/05 [id number]  RN Health Coach telephone call to patient.  [employer] compliance verified. Patient was asleep and spoke with Robert Clayton Robert wife. Per Robert Clayton Robert patient stays up at night and sleeps during Robert day. Robert Clayton sates his appetite is good and that both of them do Robert cooking. RN asked what is Robert best time to call and per Robert Clayton you just have to try and see if he is awake.Per Robert Clayton he does a lot of work outside for exercise. He doesn't have any special routine. Patient wife has agreed to further outreach calls.  Encounter Medications:  Outpatient Encounter Medications as of 07/26/2019  Medication Sig  . acetaminophen (TYLENOL) 325 MG tablet Take 650 mg by mouth every 6 (six) hours as needed.  [name] allopurinol (ZYLOPRIM) 300 MG tablet Take 1 tablet (300 mg total) by mouth daily.  [name] aspirin EC 81 MG tablet Take 81 mg by mouth daily.  [name] atorvastatin (LIPITOR) 10 MG tablet TAKE 1 TABLET BY MOUTH EVERY DAY  . B-D INS SYR ULTRAFINE 1CC/30G 30G X 1/2" 1 ML MISC USE AS DIRECTED TWICE A DAY (Patient taking differently: 1 each by Other route 3 (three) times daily. )  . hydrochlorothiazide (HYDRODIURIL) 12.5 MG tablet TAKE 1 TABLET BY MOUTH EVERY DAY  . insulin NPH Human (NOVOLIN N RELION) 100 UNIT/ML injection 80 units each morning, and 30 units each evening.  . isosorbide mononitrate (IMDUR) 30 MG 24 hr tablet Take 1 tablet (30 mg total) by mouth daily.  . Lancets (ONETOUCH DELICA PLUS [id number]) MISC TEST 2 TIMES DAILY. DX: E11.51  . metoprolol tartrate (LOPRESSOR) 50 MG tablet TAKE 1 TABLET TWICE A DAY  . nitroGLYCERIN (NITROSTAT) 0.4 MG SL tablet Place 1 tablet (0.4 mg total) under Robert tongue every 5 (five) minutes as needed. x3 doses as needed for chest pain  . omeprazole (PRILOSEC) 40 MG capsule Take 1 capsule (40 mg total) by mouth daily.  Robert Clayton VERIO test strip USE TO TEST BLOOD SUGAR  TWICE A DAY DX E11.51  . telmisartan (MICARDIS) 80 MG tablet Take 1 tablet (80 mg total) by mouth daily.  . [DISCONTINUED] atorvastatin (LIPITOR) 10 MG tablet TAKE 1 TABLET BY MOUTH EVERY DAY  . [DISCONTINUED] hydrochlorothiazide (HYDRODIURIL) 12.5 MG tablet TAKE 1 TABLET BY MOUTH EVERY DAY  . [DISCONTINUED] metoprolol tartrate (LOPRESSOR) 50 MG tablet TAKE 1 TABLET TWICE A DAY   No facility-administered encounter medications on file as of 07/26/2019.    Functional Status:  In your present state of health, do you have any difficulty performing Robert following activities: 08/27/2018  Hearing? Y  Walking or climbing stairs? Y  Dressing or bathing? N  Doing errands, shopping? N  Preparing Food and eating ? N  Using Robert Toilet? N  In Robert past six months, have you accidently leaked urine? N  Managing your Medications? N  Managing your Finances? N  Housekeeping or managing your Housekeeping? Y  Some recent data might be hidden    Fall/Depression Screening: Fall Risk  07/26/2019 08/27/2018 06/30/2018  Falls in Robert past year? 0 0 0  Number falls in past yr: 0 0 0  Injury with Fall? 0 0 -  Risk for fall due to : Impaired balance/gait;Impaired mobility Impaired balance/gait;Impaired mobility (No Data)  Risk for fall due to: Comment - - states he is not as steady as he use to be but has not fallen.  Follow up Falls evaluation completed Falls evaluation completed Falls prevention discussed   PHQ 2/9 Scores 07/26/2019 06/30/2018 05/28/2018 02/10/2018 04/29/2017 04/24/2017 04/15/2016  PHQ - 2 Score 0 0 0 0 0 1 0  PHQ- 9 Score - - - - - 4 -   Goals Addressed              This Visit's Progress   .  Diabetes Patient stated goal (pt-stated)        CARE PLAN ENTRY (see longtitudinal plan of care for additional care plan information)  Objective:  Lab Results  Component Value Date   HGBA1C 7.9 (A) 06/29/2019 .   Lab Results  Component Value Date   CREATININE 2.63 (H) 03/30/2019   CREATININE 2.36  (H) 09/30/2018   CREATININE 2.25 (H) 03/30/2018 .   [name] No results found for: EGFR  Current Barriers:  . Behavior modifications  Case Manager Clinical Goal(s):  Over Robert next 90 days, patient will demonstrate improved adherence to prescribed treatment plan for diabetes self care/management as evidenced by: Seeing a decrease in A1C from 7.9 . Verbalize daily monitoring and recording of CBG within 90 days . Verbalize adherence to ADA/ carb modified diet within Robert next 90 days  Interventions:  . Provided education to patient about basic DM disease process . Discussed plans with patient for ongoing care management follow up and provided patient with direct contact information for care management team . Reviewed scheduled/upcoming provider appointments including: PCP, endocrinologist and eye exam.  . Advised patient, providing education and rationale, to check cbg bid and record, calling BS<70 or frequent readings of BS > 200 for findings outside established parameters.   . RN sent educational material on Healthy meal planning . RN sent Exercise program activity booklet  Patient Self Care Activities:  . Attends all scheduled provider appointments . Adheres to prescribed ADA/carb modified  RN will follow up outreach within Robert month of October         Assessment:  A1C 7.9 Per wife patient asleep unable to giver her Robert fasting blood sugars Per patient wife appetite good No exercise routine  Plan:  RN sent educational material on healthy meal planning RN sent Exercise program activity booklet RN will follow up within Robert month of October  Robert Clayton [place] Care Management [phone]

## 2019-08-23 ENCOUNTER — Ambulatory Visit (INDEPENDENT_AMBULATORY_CARE_PROVIDER_SITE_OTHER): Payer: Medicare Other | Admitting: *Deleted

## 2019-08-23 ENCOUNTER — Other Ambulatory Visit: Payer: Self-pay

## 2019-08-23 DIAGNOSIS — E538 Deficiency of other specified B group vitamins: Secondary | ICD-10-CM

## 2019-08-23 MED ORDER — CYANOCOBALAMIN 1000 MCG/ML IJ SOLN
1000.0000 ug | Freq: Once | INTRAMUSCULAR | Status: AC
  Administered 2019-08-23: 1000 ug via INTRAMUSCULAR

## 2019-08-23 MED ORDER — CYANOCOBALAMIN 1000 MCG/ML IJ SOLN
1000.0000 ug | Freq: Once | INTRAMUSCULAR | Status: AC
  Administered 2019-09-23: 1000 ug via INTRAMUSCULAR

## 2019-08-23 NOTE — Progress Notes (Signed)
I have reviewed and agree.

## 2019-08-23 NOTE — Progress Notes (Addendum)
I have reviewed and agree  Pls cosign for B12 inj../lmb  

## 2019-09-06 ENCOUNTER — Other Ambulatory Visit: Payer: Self-pay | Admitting: Internal Medicine

## 2019-09-06 DIAGNOSIS — IMO0002 Reserved for concepts with insufficient information to code with codable children: Secondary | ICD-10-CM

## 2019-09-06 DIAGNOSIS — I129 Hypertensive chronic kidney disease with stage 1 through stage 4 chronic kidney disease, or unspecified chronic kidney disease: Secondary | ICD-10-CM

## 2019-09-06 NOTE — Telephone Encounter (Signed)
Please refill as per office routine med refill policy (all routine meds refilled for 3 mo or monthly per pt preference up to one year from last visit, then month to month grace period for 3 mo, then further med refills will have to be denied)  

## 2019-09-23 ENCOUNTER — Encounter: Payer: Self-pay | Admitting: Internal Medicine

## 2019-09-23 ENCOUNTER — Other Ambulatory Visit: Payer: Self-pay

## 2019-09-23 ENCOUNTER — Ambulatory Visit (INDEPENDENT_AMBULATORY_CARE_PROVIDER_SITE_OTHER): Payer: Medicare Other | Admitting: Internal Medicine

## 2019-09-23 ENCOUNTER — Ambulatory Visit: Payer: Medicare Other

## 2019-09-23 VITALS — BP 158/72 | HR 62 | Temp 98.5°F | Ht 72.0 in | Wt 198.2 lb

## 2019-09-23 DIAGNOSIS — E1151 Type 2 diabetes mellitus with diabetic peripheral angiopathy without gangrene: Secondary | ICD-10-CM | POA: Diagnosis not present

## 2019-09-23 DIAGNOSIS — E538 Deficiency of other specified B group vitamins: Secondary | ICD-10-CM | POA: Diagnosis not present

## 2019-09-23 DIAGNOSIS — I25110 Atherosclerotic heart disease of native coronary artery with unstable angina pectoris: Secondary | ICD-10-CM | POA: Diagnosis not present

## 2019-09-23 DIAGNOSIS — I129 Hypertensive chronic kidney disease with stage 1 through stage 4 chronic kidney disease, or unspecified chronic kidney disease: Secondary | ICD-10-CM | POA: Diagnosis not present

## 2019-09-23 DIAGNOSIS — E1165 Type 2 diabetes mellitus with hyperglycemia: Secondary | ICD-10-CM

## 2019-09-23 DIAGNOSIS — F5104 Psychophysiologic insomnia: Secondary | ICD-10-CM | POA: Diagnosis not present

## 2019-09-23 DIAGNOSIS — D518 Other vitamin B12 deficiency anemias: Secondary | ICD-10-CM | POA: Diagnosis not present

## 2019-09-23 DIAGNOSIS — E785 Hyperlipidemia, unspecified: Secondary | ICD-10-CM

## 2019-09-23 DIAGNOSIS — IMO0002 Reserved for concepts with insufficient information to code with codable children: Secondary | ICD-10-CM

## 2019-09-23 DIAGNOSIS — Z23 Encounter for immunization: Secondary | ICD-10-CM | POA: Diagnosis not present

## 2019-09-23 MED ORDER — BELSOMRA 15 MG PO TABS: 1.0000 | ORAL_TABLET | Freq: Every evening | ORAL | 1 refills | Status: DC | PRN

## 2019-09-23 NOTE — Patient Instructions (Signed)
Type 2 Diabetes Mellitus, Diagnosis, Adult Type 2 diabetes (type 2 diabetes mellitus) is a long-term (chronic) disease. In type 2 diabetes, one or both of these problems may be present:  The pancreas does not make enough of a hormone called insulin.  Cells in the body do not respond properly to insulin that the body makes (insulin resistance). Normally, insulin allows blood sugar (glucose) to enter cells in the body. The cells use glucose for energy. Insulin resistance or lack of insulin causes excess glucose to build up in the blood instead of going into cells. As a result, high blood glucose (hyperglycemia) develops. What increases the risk? The following factors may make you more likely to develop type 2 diabetes:  Having a family member with type 2 diabetes.  Being overweight or obese.  Having an inactive (sedentary) lifestyle.  Having been diagnosed with insulin resistance.  Having a history of prediabetes, gestational diabetes, or polycystic ovary syndrome (PCOS).  Being of American-Indian, African-American, Hispanic/Latino, or Asian/Pacific Islander descent. What are the signs or symptoms? In the early stage of this condition, you may not have symptoms. Symptoms develop slowly and may include:  Increased thirst (polydipsia).  Increased hunger(polyphagia).  Increased urination (polyuria).  Increased urination during the night (nocturia).  Unexplained weight loss.  Frequent infections that keep coming back (recurring).  Fatigue.  Weakness.  Vision changes, such as blurry vision.  Cuts or bruises that are slow to heal.  Tingling or numbness in the hands or feet.  Dark patches on the skin (acanthosis nigricans). How is this diagnosed? This condition is diagnosed based on your symptoms, your medical history, a physical exam, and your blood glucose level. Your blood glucose may be checked with one or more of the following blood tests:  A fasting blood glucose (FBG)  test. You will not be allowed to eat (you will fast) for 8 hours or longer before a blood sample is taken.  A random blood glucose test. This test checks blood glucose at any time of day regardless of when you ate.  An A1c (hemoglobin A1c) blood test. This test provides information about blood glucose control over the previous 2-3 months.  An oral glucose tolerance test (OGTT). This test measures your blood glucose at two times: ? After fasting. This is your baseline blood glucose level. ? Two hours after drinking a beverage that contains glucose. You may be diagnosed with type 2 diabetes if:  Your FBG level is 126 mg/dL (7.0 mmol/L) or higher.  Your random blood glucose level is 200 mg/dL (11.1 mmol/L) or higher.  Your A1c level is 6.5% or higher.  Your OGTT result is higher than 200 mg/dL (11.1 mmol/L). These blood tests may be repeated to confirm your diagnosis. How is this treated? Your treatment may be managed by a specialist called an endocrinologist. Type 2 diabetes may be treated by following instructions from your health care provider about:  Making diet and lifestyle changes. This may include: ? Following an individualized nutrition plan that is developed by a diet and nutrition specialist (registered dietitian). ? Exercising regularly. ? Finding ways to manage stress.  Checking your blood glucose level as often as told.  Taking diabetes medicines or insulin daily. This helps to keep your blood glucose levels in the healthy range. ? If you use insulin, you may need to adjust the dosage depending on how physically active you are and what foods you eat. Your health care provider will tell you how to adjust your dosage.    Taking medicines to help prevent complications from diabetes, such as: ? Aspirin. ? Medicine to lower cholesterol. ? Medicine to control blood pressure. Your health care provider will set individualized treatment goals for you. Your goals will be based on  your age, other medical conditions you have, and how you respond to diabetes treatment. Generally, the goal of treatment is to maintain the following blood glucose levels:  Before meals (preprandial): 80-130 mg/dL (4.4-7.2 mmol/L).  After meals (postprandial): below 180 mg/dL (10 mmol/L).  A1c level: less than 7%. Follow these instructions at home: Questions to ask your health care provider  Consider asking the following questions: ? Do I need to meet with a diabetes educator? ? Where can I find a support group for people with diabetes? ? What equipment will I need to manage my diabetes at home? ? What diabetes medicines do I need, and when should I take them? ? How often do I need to check my blood glucose? ? What number can I call if I have questions? ? When is my next appointment? General instructions  Take over-the-counter and prescription medicines only as told by your health care provider.  Keep all follow-up visits as told by your health care provider. This is important.  For more information about diabetes, visit: ? American Diabetes Association (ADA): www.diabetes.org ? American Association of Diabetes Educators (AADE): www.diabeteseducator.org Contact a health care provider if:  Your blood glucose is at or above 240 mg/dL (13.3 mmol/L) for 2 days in a row.  You have been sick or have had a fever for 2 days or longer, and you are not getting better.  You have any of the following problems for more than 6 hours: ? You cannot eat or drink. ? You have nausea and vomiting. ? You have diarrhea. Get help right away if:  Your blood glucose is lower than 54 mg/dL (3.0 mmol/L).  You become confused or you have trouble thinking clearly.  You have difficulty breathing.  You have moderate or large ketone levels in your urine. Summary  Type 2 diabetes (type 2 diabetes mellitus) is a long-term (chronic) disease. In type 2 diabetes, the pancreas does not make enough of a  hormone called insulin, or cells in the body do not respond properly to insulin that the body makes (insulin resistance).  This condition is treated by making diet and lifestyle changes and taking diabetes medicines or insulin.  Your health care provider will set individualized treatment goals for you. Your goals will be based on your age, other medical conditions you have, and how you respond to diabetes treatment.  Keep all follow-up visits as told by your health care provider. This is important. This information is not intended to replace advice given to you by your health care provider. Make sure you discuss any questions you have with your health care provider. Document Revised: 02/21/2017 Document Reviewed: 01/27/2015 Elsevier Patient Education  2020 Elsevier Inc.  

## 2019-09-23 NOTE — Progress Notes (Signed)
Subjective:  Patient ID: Robert Clayton, male    DOB: [birth year]  Age: 86 y.o. MRN: [id number]  CC: Anemia, Hypertension, Hyperlipidemia, and Diabetes  This visit occurred during the SARS-CoV-2 public health emergency.  Safety protocols were in place, including screening questions prior to the visit, additional usage of staff PPE, and extensive cleaning of exam room while observing appropriate contact time as indicated for disinfecting solutions.    HPI Robert Clayton presents for f/up - He complains of insomnia with frequent awakenings and difficulty falling asleep.  He tells me the insomnia is causing next-day fatigue.  He tells me his blood pressure and blood sugar have been well controlled.  He offers no recent complaints.  Outpatient Medications Prior to Visit  Medication Sig Dispense Refill  . acetaminophen (TYLENOL) 325 MG tablet Take 650 mg by mouth every 6 (six) hours as needed.    [name] allopurinol (ZYLOPRIM) 300 MG tablet TAKE 1 TABLET BY MOUTH EVERY DAY 90 tablet 1  . aspirin EC 81 MG tablet Take 81 mg by mouth daily.    [name] atorvastatin (LIPITOR) 10 MG tablet TAKE 1 TABLET BY MOUTH EVERY DAY 90 tablet 1  . B-D INS SYR ULTRAFINE 1CC/30G 30G X 1/2" 1 ML MISC USE AS DIRECTED TWICE A DAY (Patient taking differently: 1 each by Other route 3 (three) times daily. ) 200 each 3  . hydrochlorothiazide (HYDRODIURIL) 12.5 MG tablet TAKE 1 TABLET BY MOUTH EVERY DAY 90 tablet 1  . insulin NPH Human (NOVOLIN N RELION) 100 UNIT/ML injection 80 units each morning, and 30 units each evening. 90 mL 3  . isosorbide mononitrate (IMDUR) 30 MG 24 hr tablet Take 1 tablet (30 mg total) by mouth daily. 90 tablet 1  . Lancets (ONETOUCH DELICA PLUS [id number]) MISC TEST 2 TIMES DAILY. DX: E11.51 100 each 0  . metoprolol tartrate (LOPRESSOR) 50 MG tablet TAKE 1 TABLET TWICE A DAY 180 tablet 1  . nitroGLYCERIN (NITROSTAT) 0.4 MG SL tablet Place 1 tablet (0.4 mg total) under the tongue every 5 (five) minutes as needed.  x3 doses as needed for chest pain 30 tablet 0  . omeprazole (PRILOSEC) 40 MG capsule TAKE 1 CAPSULE BY MOUTH EVERY DAY 90 capsule 1  . ONETOUCH VERIO test strip USE TO TEST BLOOD SUGAR TWICE A DAY DX E11.51 200 strip 3  . telmisartan (MICARDIS) 80 MG tablet TAKE 1 TABLET BY MOUTH EVERY DAY 90 tablet 1  . cyanocobalamin ((VITAMIN B-12)) injection 1,000 mcg      No facility-administered medications prior to visit.    ROS Review of Systems  Constitutional: Negative for diaphoresis and fatigue.  HENT: Negative.   Eyes: Negative for visual disturbance.  Respiratory: Negative for cough, chest tightness, shortness of breath and wheezing.   Cardiovascular: Negative for chest pain, palpitations and leg swelling.  Gastrointestinal: Negative for abdominal pain, constipation, diarrhea, nausea and vomiting.  Endocrine: Negative for polydipsia, polyphagia and polyuria.  Genitourinary: Negative.  Negative for difficulty urinating.  Musculoskeletal: Negative for arthralgias and myalgias.  Skin: Negative.  Negative for color change.  Neurological: Negative.  Negative for dizziness, weakness and light-headedness.  Hematological: Negative for adenopathy. Does not bruise/bleed easily.  Psychiatric/Behavioral: Positive for sleep disturbance. Negative for decreased concentration and dysphoric mood. The patient is not nervous/anxious.     Objective:  BP (!) 158/72 (BP Location: Left Arm, Patient Position: Sitting, Cuff Size: Normal)   Pulse 62   Temp 98.5 F (36.9 C) (Oral)   Ht 6' (1.829  m)   Wt 198 lb 4 oz (89.9 kg)   SpO2 97%   BMI 26.89 kg/m   BP Readings from Last 3 Encounters:  09/23/19 (!) 158/72  06/29/19 118/64  05/05/19 (!) 144/64    Wt Readings from Last 3 Encounters:  09/23/19 198 lb 4 oz (89.9 kg)  06/29/19 200 lb (90.7 kg)  05/05/19 200 lb (90.7 kg)    Physical Exam Vitals reviewed.  Constitutional:      Appearance: Normal appearance.  HENT:     Nose: Nose normal.      Mouth/Throat:     Mouth: Mucous membranes are moist.  Eyes:     General: No scleral icterus.    Conjunctiva/sclera: Conjunctivae normal.  Cardiovascular:     Rate and Rhythm: Normal rate and regular rhythm.     Heart sounds: Murmur heard.   Pulmonary:     Breath sounds: No stridor. No wheezing, rhonchi or rales.  Abdominal:     General: Abdomen is flat.     Palpations: There is no mass.     Tenderness: There is no abdominal tenderness. There is no guarding.  Musculoskeletal:        General: Normal range of motion.     Cervical back: Neck supple.     Right lower leg: No edema.     Left lower leg: No edema.  Lymphadenopathy:     Cervical: No cervical adenopathy.  Skin:    General: Skin is warm and dry.  Neurological:     General: No focal deficit present.     Mental Status: He is alert.  Psychiatric:        Mood and Affect: Mood normal.        Behavior: Behavior normal.     Lab Results  Component Value Date   WBC 7.5 09/23/2019   HGB 12.3 (L) 09/23/2019   HCT 38.9 09/23/2019   PLT 94 (L) 09/23/2019   GLUCOSE 169 (H) 09/23/2019   CHOL 128 09/23/2019   TRIG 91 09/23/2019   HDL 36 (L) 09/23/2019   LDLDIRECT 80.6 06/26/2010   LDLCALC 75 09/23/2019   ALT 13 03/30/2018   AST 16 03/30/2018   NA 143 09/23/2019   K 4.9 09/23/2019   CL 111 (H) 09/23/2019   CREATININE 2.36 (H) 09/23/2019   BUN 35 (H) 09/23/2019   CO2 26 09/23/2019   TSH 1.45 09/30/2018   PSA 0.03 (L) 06/29/2009   INR 1.1 04/08/2008   HGBA1C 8.6 (H) 09/23/2019   MICROALBUR 2.4 09/23/2019    CT Head Wo Contrast  Result Date: 11/04/2017 CLINICAL DATA:  Acute headache, worst headache of life, right-sided, prior subdural hematoma years ago EXAM: CT HEAD WITHOUT CONTRAST TECHNIQUE: Contiguous axial images were obtained from the base of the skull through the vertex without intravenous contrast. COMPARISON:  CT brain scan of 08/29/2016 FINDINGS: Brain: The ventricular system remains prominent as are the  cortical sulci indicative of diffuse atrophy. The septum is midline in position. Mild small vessel ischemic change remains throughout the periventricular white matter. No hemorrhage, mass lesi. Skull: Changes of right parietal craniotomy again are noted. No complicating features are seen. Sinuses/Orbits: The paranasal sinuses appear well pneumatized. Other: None. IMPRESSION: 1.on, or acute infarction is seen. Vascular: No vascular abnormality is evident on this unenhanced study No change in atrophy and mild small vessel ischemic change in the periventricular white matter. 2. Stable right parietal craniotomy. Electronically Signed   By: Ivar Drape M.D.   On:  11/04/2017 10:24    Assessment & Plan:   Robert Clayton was seen today for anemia, hypertension, hyperlipidemia and diabetes.  Diagnoses and all orders for this visit:  ANEMIA, B12 DEFICIENCY- His H&H have improved.  He will continue parenteral B12 replacement therapy. -     CBC with Differential/Platelet; Future -     CBC with Differential/Platelet  Need for influenza vaccination -     Flu Vaccine QUAD High Dose(Fluad)  DM (diabetes mellitus), type 2, uncontrolled, periph vascular complic ([place])- His W4X is at 8.6%.  His blood sugar is adequately well controlled. -     BASIC METABOLIC PANEL WITH GFR; Future -     Hemoglobin A1c; Future -     Microalbumin / creatinine urine ratio; Future -     Microalbumin / creatinine urine ratio -     Hemoglobin A1c -     BASIC METABOLIC PANEL WITH GFR  Hypertensive renal disease- His blood pressure is adequately well controlled. -     BASIC METABOLIC PANEL WITH GFR; Future -     Urinalysis, Routine w reflex microscopic; Future -     Microalbumin / creatinine urine ratio; Future -     Microalbumin / creatinine urine ratio -     Urinalysis, Routine w reflex microscopic -     BASIC METABOLIC PANEL WITH GFR  Hyperlipidemia with target LDL less than 70- He has achieved his LDL goal and is doing well on the  statin. -     Lipid panel; Future -     Lipid panel  Psychophysiological insomnia -     Suvorexant (BELSOMRA) 15 MG TABS; Take 1 tablet by mouth at bedtime as needed.   I am having Robert Clayton start on Robert Clayton. I am also having him maintain his nitroGLYCERIN, acetaminophen, B-D INS SYR ULTRAFINE 1CC/30G, aspirin EC, OneTouch Verio, OneTouch Delica Plus [id number], insulin NPH Human, isosorbide mononitrate, hydrochlorothiazide, metoprolol tartrate, atorvastatin, omeprazole, telmisartan, and allopurinol. We administered cyanocobalamin.  Meds ordered this encounter  Medications  . Suvorexant (BELSOMRA) 15 MG TABS    Sig: Take 1 tablet by mouth at bedtime as needed.    Dispense:  90 tablet    Refill:  1     Follow-up: Return in about 6 months (around 03/22/2020).  [name], MD

## 2019-09-24 LAB — BASIC METABOLIC PANEL WITH GFR
BUN/Creatinine Ratio: 15 (calc) (ref 6–22)
BUN: 35 mg/dL — ABNORMAL HIGH (ref 7–25)
CO2: 26 mmol/L (ref 20–32)
Calcium: 9.2 mg/dL (ref 8.6–10.3)
Chloride: 111 mmol/L — ABNORMAL HIGH (ref 98–110)
Creat: 2.36 mg/dL — ABNORMAL HIGH (ref 0.70–1.11)
GFR, Est African American: 28 mL/min/{1.73_m2} — ABNORMAL LOW (ref >=60)
GFR, Est Non African American: 24 mL/min/{1.73_m2} — ABNORMAL LOW (ref >=60)
Glucose, Bld: 169 mg/dL — ABNORMAL HIGH (ref 65–99)
Potassium: 4.9 mmol/L (ref 3.5–5.3)
Sodium: 143 mmol/L (ref 135–146)

## 2019-09-24 LAB — URINALYSIS, ROUTINE W REFLEX MICROSCOPIC
Bilirubin Urine: NEGATIVE
Hgb urine dipstick: NEGATIVE
Ketones, ur: NEGATIVE
Leukocytes,Ua: NEGATIVE
Nitrite: NEGATIVE
Protein, ur: NEGATIVE
Specific Gravity, Urine: 1.013 (ref 1.001–1.03)
pH: 5.5 (ref 5.0–8.0)

## 2019-09-24 LAB — CBC WITH DIFFERENTIAL/PLATELET
Absolute Monocytes: 705 cells/uL (ref 200–950)
Basophils Absolute: 38 cells/uL (ref 0–200)
Basophils Relative: 0.5 %
Eosinophils Absolute: 150 cells/uL (ref 15–500)
Eosinophils Relative: 2 %
HCT: 38.9 % (ref 38.5–50.0)
Hemoglobin: 12.3 g/dL — ABNORMAL LOW (ref 13.2–17.1)
Lymphs Abs: 2123 cells/uL (ref 850–3900)
MCH: 30.4 pg (ref 27.0–33.0)
MCHC: 31.6 g/dL — ABNORMAL LOW (ref 32.0–36.0)
MCV: 96.3 fL (ref 80.0–100.0)
MPV: 10.8 fL (ref 7.5–12.5)
Monocytes Relative: 9.4 %
Neutro Abs: 4485 cells/uL (ref 1500–7800)
Neutrophils Relative %: 59.8 %
Platelets: 94 10*3/uL — ABNORMAL LOW (ref 140–400)
RBC: 4.04 10*6/uL — ABNORMAL LOW (ref 4.20–5.80)
RDW: 14.5 % (ref 11.0–15.0)
Total Lymphocyte: 28.3 %
WBC: 7.5 10*3/uL (ref 3.8–10.8)

## 2019-09-24 LAB — MICROALBUMIN / CREATININE URINE RATIO
Creatinine, Urine: 100 mg/dL (ref 20–320)
Microalb Creat Ratio: 24 mcg/mg creat (ref <30)
Microalb, Ur: 2.4 mg/dL

## 2019-09-24 LAB — LIPID PANEL
Cholesterol: 128 mg/dL (ref <200)
HDL: 36 mg/dL — ABNORMAL LOW (ref >=40)
LDL Cholesterol (Calc): 75 mg/dL (calc)
Non-HDL Cholesterol (Calc): 92 mg/dL (calc) (ref <130)
Total CHOL/HDL Ratio: 3.6 (calc) (ref <5.0)
Triglycerides: 91 mg/dL (ref <150)

## 2019-09-24 LAB — HEMOGLOBIN A1C
Hgb A1c MFr Bld: 8.6 % of total Hgb — ABNORMAL HIGH (ref <5.7)
Mean Plasma Glucose: 200 (calc)
eAG (mmol/L): 11.1 (calc)

## 2019-09-29 ENCOUNTER — Ambulatory Visit: Payer: Medicare Other | Admitting: Endocrinology

## 2019-09-30 ENCOUNTER — Ambulatory Visit: Payer: Medicare Other | Admitting: Internal Medicine

## 2019-10-01 ENCOUNTER — Encounter: Payer: Self-pay | Admitting: Podiatry

## 2019-10-01 ENCOUNTER — Ambulatory Visit (INDEPENDENT_AMBULATORY_CARE_PROVIDER_SITE_OTHER): Payer: Medicare Other | Admitting: Podiatry

## 2019-10-01 ENCOUNTER — Other Ambulatory Visit: Payer: Self-pay

## 2019-10-01 DIAGNOSIS — E1165 Type 2 diabetes mellitus with hyperglycemia: Secondary | ICD-10-CM | POA: Diagnosis not present

## 2019-10-01 DIAGNOSIS — I872 Venous insufficiency (chronic) (peripheral): Secondary | ICD-10-CM

## 2019-10-01 DIAGNOSIS — M79675 Pain in left toe(s): Secondary | ICD-10-CM | POA: Diagnosis not present

## 2019-10-01 DIAGNOSIS — E1151 Type 2 diabetes mellitus with diabetic peripheral angiopathy without gangrene: Secondary | ICD-10-CM | POA: Diagnosis not present

## 2019-10-01 DIAGNOSIS — M79674 Pain in right toe(s): Secondary | ICD-10-CM | POA: Insufficient documentation

## 2019-10-01 DIAGNOSIS — B351 Tinea unguium: Secondary | ICD-10-CM

## 2019-10-01 DIAGNOSIS — N183 Chronic kidney disease, stage 3 unspecified: Secondary | ICD-10-CM | POA: Diagnosis not present

## 2019-10-01 DIAGNOSIS — IMO0002 Reserved for concepts with insufficient information to code with codable children: Secondary | ICD-10-CM

## 2019-10-01 NOTE — Progress Notes (Signed)
This patient returns to my office for at risk foot care.  This patient requires this care by a professional since this patient will be at risk due to having diabetes venous stasis  This patient is unable to cut nails himself since the patient cannot reach his nails.These nails are painful walking and wearing shoes.  This patient presents for at risk foot care today.  General Appearance  Alert, conversant and in no acute stress.  Vascular  Dorsalis pedis and posterior tibial  pulses are weakly  palpable  bilaterally.  Capillary return is within normal limits  bilaterally. Temperature is within normal limits  bilaterally.  Neurologic  Senn-Weinstein monofilament wire test within normal limits  bilaterally. Muscle power within normal limits bilaterally.  Nails Thick disfigured discolored nails with subungual debris  from hallux to fifth toes bilaterally. No evidence of bacterial infection or drainage bilaterally.  Orthopedic  No limitations of motion  feet .  No crepitus or effusions noted.  No bony pathology or digital deformities noted.  Skin  normotropic skin with no porokeratosis noted bilaterally.  No signs of infections or ulcers noted.     Onychomycosis  Pain in right toes  Pain in left toes  Consent was obtained for treatment procedures.   Mechanical debridement of nails 1-5  bilaterally performed with a nail nipper.  Filed with dremel without incident.    Return office visit   3 months                   Told patient to return for periodic foot care and evaluation due to potential at risk complications.   Gardiner Barefoot DPM

## 2019-10-04 ENCOUNTER — Encounter: Payer: Self-pay | Admitting: Internal Medicine

## 2019-10-13 LAB — HM DIABETES EYE EXAM

## 2019-10-25 ENCOUNTER — Ambulatory Visit (INDEPENDENT_AMBULATORY_CARE_PROVIDER_SITE_OTHER): Payer: Medicare Other | Admitting: *Deleted

## 2019-10-25 ENCOUNTER — Other Ambulatory Visit: Payer: Self-pay

## 2019-10-25 DIAGNOSIS — E538 Deficiency of other specified B group vitamins: Secondary | ICD-10-CM | POA: Diagnosis not present

## 2019-10-25 MED ORDER — CYANOCOBALAMIN 1000 MCG/ML IJ SOLN
1000.0000 ug | Freq: Once | INTRAMUSCULAR | Status: AC
  Administered 2019-10-25: 1000 ug via INTRAMUSCULAR

## 2019-10-25 NOTE — Progress Notes (Signed)
Pls cosign for B12 inj../lmb  

## 2019-10-26 ENCOUNTER — Other Ambulatory Visit: Payer: Self-pay | Admitting: *Deleted

## 2019-10-26 NOTE — Patient Outreach (Signed)
[facility]) Care Management  10/26/2019  Robert Clayton 1930-11-24 [id number]  RN Health Coach attempted follow up outreach call to patient.  Patient was unavailable. Per wife she would have him to call me when he finishes mowing the grass.  Plan: RN will call patient again within 30 days if no return call.  [place] Care Management [phone]

## 2019-11-02 ENCOUNTER — Other Ambulatory Visit: Payer: Self-pay | Admitting: Internal Medicine

## 2019-11-02 DIAGNOSIS — I129 Hypertensive chronic kidney disease with stage 1 through stage 4 chronic kidney disease, or unspecified chronic kidney disease: Secondary | ICD-10-CM

## 2019-11-05 ENCOUNTER — Other Ambulatory Visit: Payer: Self-pay | Admitting: Internal Medicine

## 2019-11-08 DIAGNOSIS — Z794 Long term (current) use of insulin: Secondary | ICD-10-CM | POA: Diagnosis not present

## 2019-11-08 DIAGNOSIS — E119 Type 2 diabetes mellitus without complications: Secondary | ICD-10-CM | POA: Diagnosis not present

## 2019-11-08 DIAGNOSIS — Z961 Presence of intraocular lens: Secondary | ICD-10-CM | POA: Diagnosis not present

## 2019-11-25 ENCOUNTER — Other Ambulatory Visit: Payer: Self-pay

## 2019-11-25 ENCOUNTER — Ambulatory Visit (INDEPENDENT_AMBULATORY_CARE_PROVIDER_SITE_OTHER): Payer: Medicare Other

## 2019-11-25 DIAGNOSIS — E538 Deficiency of other specified B group vitamins: Secondary | ICD-10-CM

## 2019-11-25 MED ORDER — CYANOCOBALAMIN 1000 MCG/ML IJ SOLN
1000.0000 ug | Freq: Once | INTRAMUSCULAR | Status: AC
  Administered 2019-11-25: 1000 ug via INTRAMUSCULAR

## 2019-11-25 NOTE — Progress Notes (Addendum)
B12 given and tolerated well °

## 2019-12-01 ENCOUNTER — Other Ambulatory Visit: Payer: Self-pay | Admitting: Internal Medicine

## 2019-12-01 DIAGNOSIS — I25110 Atherosclerotic heart disease of native coronary artery with unstable angina pectoris: Secondary | ICD-10-CM

## 2019-12-06 ENCOUNTER — Other Ambulatory Visit: Payer: Self-pay | Admitting: *Deleted

## 2019-12-06 NOTE — Patient Outreach (Signed)
[facility]) Care Management  12/06/2019  Arby Dahir [birth year] [id number]   RN Health Coach attempted follow up outreach call to patient.  Patient was unavailable. Per patient wife he is asleep.   Plan: RN will call patient again within 30 days.  [place] Management [phone] Per patient wife she is asleep.

## 2019-12-18 ENCOUNTER — Other Ambulatory Visit: Payer: Self-pay | Admitting: Internal Medicine

## 2019-12-18 DIAGNOSIS — E785 Hyperlipidemia, unspecified: Secondary | ICD-10-CM

## 2019-12-18 DIAGNOSIS — IMO0002 Reserved for concepts with insufficient information to code with codable children: Secondary | ICD-10-CM

## 2019-12-18 DIAGNOSIS — I2510 Atherosclerotic heart disease of native coronary artery without angina pectoris: Secondary | ICD-10-CM

## 2019-12-27 ENCOUNTER — Ambulatory Visit (INDEPENDENT_AMBULATORY_CARE_PROVIDER_SITE_OTHER): Payer: Medicare Other

## 2019-12-27 ENCOUNTER — Other Ambulatory Visit: Payer: Self-pay

## 2019-12-27 DIAGNOSIS — E538 Deficiency of other specified B group vitamins: Secondary | ICD-10-CM

## 2019-12-27 MED ORDER — CYANOCOBALAMIN 1000 MCG/ML IJ SOLN
1000.0000 ug | INTRAMUSCULAR | Status: DC
  Administered 2019-12-27 – 2020-05-17 (×3): 1000 ug via INTRAMUSCULAR

## 2019-12-27 NOTE — Progress Notes (Signed)
Pt here for monthly B12 injection per Dr Ronnald Ramp.  B12 1053mcg given IM left deltoid and pt tolerated injection well.  Next B12 injection scheduled for 01/25/19 with OV.

## 2019-12-29 ENCOUNTER — Encounter: Payer: Self-pay | Admitting: Podiatry

## 2019-12-29 ENCOUNTER — Other Ambulatory Visit: Payer: Self-pay

## 2019-12-29 ENCOUNTER — Ambulatory Visit (INDEPENDENT_AMBULATORY_CARE_PROVIDER_SITE_OTHER): Payer: Medicare Other | Admitting: Podiatry

## 2019-12-29 DIAGNOSIS — N183 Chronic kidney disease, stage 3 unspecified: Secondary | ICD-10-CM

## 2019-12-29 DIAGNOSIS — M79675 Pain in left toe(s): Secondary | ICD-10-CM | POA: Diagnosis not present

## 2019-12-29 DIAGNOSIS — IMO0002 Reserved for concepts with insufficient information to code with codable children: Secondary | ICD-10-CM

## 2019-12-29 DIAGNOSIS — E1165 Type 2 diabetes mellitus with hyperglycemia: Secondary | ICD-10-CM

## 2019-12-29 DIAGNOSIS — B351 Tinea unguium: Secondary | ICD-10-CM | POA: Diagnosis not present

## 2019-12-29 DIAGNOSIS — M79674 Pain in right toe(s): Secondary | ICD-10-CM

## 2019-12-29 DIAGNOSIS — I872 Venous insufficiency (chronic) (peripheral): Secondary | ICD-10-CM

## 2019-12-29 DIAGNOSIS — E1151 Type 2 diabetes mellitus with diabetic peripheral angiopathy without gangrene: Secondary | ICD-10-CM | POA: Diagnosis not present

## 2019-12-29 NOTE — Progress Notes (Signed)
This patient returns to my office for at risk foot care.  This patient requires this care by a professional since this patient will be at risk due to having diabetes venous stasis  This patient is unable to cut nails himself since the patient cannot reach his nails.These nails are painful walking and wearing shoes.  This patient presents for at risk foot care today.  General Appearance  Alert, conversant and in no acute stress.  Vascular  Dorsalis pedis and posterior tibial  pulses are weakly  palpable  bilaterally.  Capillary return is within normal limits  bilaterally. Temperature is within normal limits  bilaterally.  Neurologic  Senn-Weinstein monofilament wire test within normal limits  bilaterally. Muscle power within normal limits bilaterally.  Nails Thick disfigured discolored nails with subungual debris  from hallux to fifth toes bilaterally. No evidence of bacterial infection or drainage bilaterally.  Orthopedic  No limitations of motion  feet .  No crepitus or effusions noted.  No bony pathology or digital deformities noted.  Skin  normotropic skin with no porokeratosis noted bilaterally.  No signs of infections or ulcers noted.     Onychomycosis  Pain in right toes  Pain in left toes  Consent was obtained for treatment procedures.   Mechanical debridement of nails 1-5  bilaterally performed with a nail nipper.  Filed with dremel without incident.    Return office visit   3 months                   Told patient to return for periodic foot care and evaluation due to potential at risk complications.   Gardiner Barefoot DPM

## 2020-01-03 ENCOUNTER — Ambulatory Visit: Payer: Medicare Other | Attending: Internal Medicine

## 2020-01-03 DIAGNOSIS — Z23 Encounter for immunization: Secondary | ICD-10-CM

## 2020-01-03 NOTE — Progress Notes (Signed)
   Covid-19 Vaccination Clinic  Name:  Cerrone Debold    MRN: [id number] DOB: 01-01-1931  01/03/2020  Mr. Oberholzer was observed post Covid-19 immunization for 15 minutes without incident. He was provided with Vaccine Information Sheet and instruction to access the V-Safe system.   Mr. Pocock was instructed to call 911 with any severe reactions post vaccine: [name] Difficulty breathing  . Swelling of face and throat  . A fast heartbeat  . A bad rash all over body  . Dizziness and weakness   Immunizations Administered    Name Date Dose VIS Date Route   Pfizer COVID-19 Vaccine 01/03/2020  4:03 PM 0.3 mL 10/27/2019 Intramuscular   Manufacturer: [place]   Lot: [id number]   [place]: [id number]

## 2020-01-06 ENCOUNTER — Other Ambulatory Visit: Payer: Self-pay | Admitting: *Deleted

## 2020-01-06 NOTE — Patient Outreach (Signed)
[facility]) Care Management  01/06/2020  Mohab Ashby November 07, 1930 [id number]  RN Health Coach telephone call to patient.  RN spoke with wife that stated the patient was sleeping. RN has tried to contact this patient 9 times unsuccessfully. Patient wife answers but is unable to tell you any of the patient blood sugar readings or any information. RN has sent out unsuccessful outreach letters with no return follow up call from patient.  Plan case closure: RN sent closure letter to patient and [place] Management [phone]

## 2020-01-17 ENCOUNTER — Telehealth: Payer: Self-pay | Admitting: Internal Medicine

## 2020-01-17 NOTE — Telephone Encounter (Signed)
LVM for pt to rtn my call to schedule awv with nha. Please schedule this appt if pt calls the office.

## 2020-01-25 ENCOUNTER — Ambulatory Visit: Payer: Medicare Other | Admitting: Internal Medicine

## 2020-01-28 ENCOUNTER — Other Ambulatory Visit: Payer: Self-pay | Admitting: Endocrinology

## 2020-01-31 ENCOUNTER — Other Ambulatory Visit: Payer: Self-pay | Admitting: Endocrinology

## 2020-01-31 NOTE — Telephone Encounter (Signed)
Last OV 6/21 please advise

## 2020-02-04 NOTE — Telephone Encounter (Signed)
1.  Please schedule f/u appt 2.  Then please refill x 2 mos, pending that appt.  

## 2020-02-07 ENCOUNTER — Other Ambulatory Visit: Payer: Self-pay | Admitting: *Deleted

## 2020-02-07 NOTE — Telephone Encounter (Signed)
Pt appt 03/08/20 @ 3:15 and pending the Rx

## 2020-02-07 NOTE — Telephone Encounter (Signed)
please refill x 2 mos, pending that appt.

## 2020-02-08 ENCOUNTER — Ambulatory Visit: Payer: Medicare Other | Admitting: Internal Medicine

## 2020-02-11 ENCOUNTER — Other Ambulatory Visit: Payer: Self-pay

## 2020-02-11 ENCOUNTER — Encounter: Payer: Self-pay | Admitting: Internal Medicine

## 2020-02-11 ENCOUNTER — Ambulatory Visit (INDEPENDENT_AMBULATORY_CARE_PROVIDER_SITE_OTHER): Payer: Medicare Other | Admitting: Internal Medicine

## 2020-02-11 VITALS — BP 138/80 | HR 71 | Temp 98.2°F | Resp 16 | Ht 72.0 in | Wt 193.0 lb

## 2020-02-11 DIAGNOSIS — I10 Essential (primary) hypertension: Secondary | ICD-10-CM

## 2020-02-11 DIAGNOSIS — IMO0002 Reserved for concepts with insufficient information to code with codable children: Secondary | ICD-10-CM

## 2020-02-11 DIAGNOSIS — E1151 Type 2 diabetes mellitus with diabetic peripheral angiopathy without gangrene: Secondary | ICD-10-CM

## 2020-02-11 DIAGNOSIS — D518 Other vitamin B12 deficiency anemias: Secondary | ICD-10-CM

## 2020-02-11 DIAGNOSIS — N183 Chronic kidney disease, stage 3 unspecified: Secondary | ICD-10-CM

## 2020-02-11 DIAGNOSIS — E1165 Type 2 diabetes mellitus with hyperglycemia: Secondary | ICD-10-CM | POA: Diagnosis not present

## 2020-02-11 LAB — CBC WITH DIFFERENTIAL/PLATELET
Basophils Absolute: 0 10*3/uL (ref 0.0–0.1)
Basophils Relative: 0.5 % (ref 0.0–3.0)
Eosinophils Absolute: 0.2 10*3/uL (ref 0.0–0.7)
Eosinophils Relative: 2.4 % (ref 0.0–5.0)
HCT: 38 % — ABNORMAL LOW (ref 39.0–52.0)
Hemoglobin: 12.7 g/dL — ABNORMAL LOW (ref 13.0–17.0)
Lymphocytes Relative: 26.1 % (ref 12.0–46.0)
Lymphs Abs: 2.4 10*3/uL (ref 0.7–4.0)
MCHC: 33.4 g/dL (ref 30.0–36.0)
MCV: 92.9 fl (ref 78.0–100.0)
Monocytes Absolute: 0.8 10*3/uL (ref 0.1–1.0)
Monocytes Relative: 8.5 % (ref 3.0–12.0)
Neutro Abs: 5.7 10*3/uL (ref 1.4–7.7)
Neutrophils Relative %: 62.5 % (ref 43.0–77.0)
Platelets: 102 10*3/uL — ABNORMAL LOW (ref 150.0–400.0)
RBC: 4.09 Mil/uL — ABNORMAL LOW (ref 4.22–5.81)
RDW: 16.2 % — ABNORMAL HIGH (ref 11.5–15.5)
WBC: 9.1 10*3/uL (ref 4.0–10.5)

## 2020-02-11 LAB — BASIC METABOLIC PANEL
BUN: 42 mg/dL — ABNORMAL HIGH (ref 6–23)
CO2: 26 mEq/L (ref 19–32)
Calcium: 9.8 mg/dL (ref 8.4–10.5)
Chloride: 110 mEq/L (ref 96–112)
Creatinine, Ser: 2.82 mg/dL — ABNORMAL HIGH (ref 0.40–1.50)
GFR: 19.56 mL/min — ABNORMAL LOW (ref >60.00)
Glucose, Bld: 125 mg/dL — ABNORMAL HIGH (ref 70–99)
Potassium: 5.1 mEq/L (ref 3.5–5.1)
Sodium: 143 mEq/L (ref 135–145)

## 2020-02-11 LAB — URINALYSIS, ROUTINE W REFLEX MICROSCOPIC
Bilirubin Urine: NEGATIVE
Hgb urine dipstick: NEGATIVE
Ketones, ur: NEGATIVE
Leukocytes,Ua: NEGATIVE
Nitrite: NEGATIVE
Specific Gravity, Urine: 1.01 (ref 1.000–1.030)
Total Protein, Urine: NEGATIVE
Urine Glucose: NEGATIVE
Urobilinogen, UA: 0.2 (ref 0.0–1.0)
pH: 5.5 (ref 5.0–8.0)

## 2020-02-11 LAB — HEMOGLOBIN A1C: Hgb A1c MFr Bld: 9.7 % — ABNORMAL HIGH (ref 4.6–6.5)

## 2020-02-11 LAB — FOLATE: Folate: 12.3 ng/mL (ref >5.9)

## 2020-02-11 MED ORDER — CYANOCOBALAMIN 1000 MCG/ML IJ SOLN
1000.0000 ug | Freq: Once | INTRAMUSCULAR | Status: AC
  Administered 2020-02-11: 1000 ug via INTRAMUSCULAR

## 2020-02-11 MED ORDER — INSULIN NPH (HUMAN) (ISOPHANE) 100 UNIT/ML ~~LOC~~ SUSP: SUBCUTANEOUS | 3 refills | Status: DC

## 2020-02-11 MED ORDER — GVOKE HYPOPEN 2-PACK 1 MG/0.2ML ~~LOC~~ SOAJ: 1.0000 | Freq: Every day | SUBCUTANEOUS | 5 refills | Status: AC | PRN

## 2020-02-11 NOTE — Progress Notes (Signed)
Subjective:  Patient ID: Robert Clayton, male    DOB: 1930-07-12  Age: 87 y.o. MRN: [id number]  CC: Anemia, Hypertension, and Diabetes  This visit occurred during the SARS-CoV-2 public health emergency.  Safety protocols were in place, including screening questions prior to the visit, additional usage of staff PPE, and extensive cleaning of exam room while observing appropriate contact time as indicated for disinfecting solutions.    HPI Vencil Basnett presents for f/up - He has had rare recent episodes of mildly symptomatic hypoglycemia.  He denies polys.  He tells me he is compliant with his insulin regimen.  Outpatient Medications Prior to Visit  Medication Sig Dispense Refill  . acetaminophen (TYLENOL) 325 MG tablet Take 650 mg by mouth every 6 (six) hours as needed.    [name] allopurinol (ZYLOPRIM) 300 MG tablet TAKE 1 TABLET BY MOUTH EVERY DAY 90 tablet 1  . aspirin EC 81 MG tablet Take 81 mg by mouth daily.    [name] atorvastatin (LIPITOR) 10 MG tablet TAKE 1 TABLET BY MOUTH EVERY DAY 90 tablet 1  . B-D INS SYR ULTRAFINE 1CC/30G 30G X 1/2" 1 ML MISC USE AS DIRECTED TWICE A DAY 200 each 3  . isosorbide mononitrate (IMDUR) 30 MG 24 hr tablet TAKE 1 TABLET BY MOUTH EVERY DAY 90 tablet 1  . Lancets (ONETOUCH DELICA PLUS PPIRJJ88C) MISC TEST 2 TIMES DAILY. DX: E11.51 100 each 0  . metoprolol tartrate (LOPRESSOR) 50 MG tablet TAKE 1 TABLET TWICE A DAY 180 tablet 1  . nitroGLYCERIN (NITROSTAT) 0.4 MG SL tablet Place 1 tablet (0.4 mg total) under the tongue every 5 (five) minutes as needed. x3 doses as needed for chest pain 30 tablet 0  . omeprazole (PRILOSEC) 40 MG capsule TAKE 1 CAPSULE BY MOUTH EVERY DAY 90 capsule 1  . ONETOUCH VERIO test strip USE TO TEST BLOOD SUGAR TWICE A DAY DX E11.51 200 strip 3  . Suvorexant (BELSOMRA) 15 MG TABS Take 1 tablet by mouth at bedtime as needed. 90 tablet 1  . telmisartan (MICARDIS) 80 MG tablet TAKE 1 TABLET BY MOUTH EVERY DAY 90 tablet 1  . hydrochlorothiazide  (HYDRODIURIL) 12.5 MG tablet TAKE 1 TABLET BY MOUTH EVERY DAY 90 tablet 1  . insulin NPH Human (NOVOLIN N RELION) 100 UNIT/ML injection 80 units each morning, and 30 units each evening. 90 mL 3   Facility-Administered Medications Prior to Visit  Medication Dose Route Frequency Provider Last Rate Last Admin  . cyanocobalamin ((VITAMIN B-12)) injection 1,000 mcg  1,000 mcg Intramuscular Q30 days Janith Lima, MD   1,000 mcg at 12/27/19 1440    ROS Review of Systems  Constitutional: Negative for appetite change, diaphoresis, fatigue and unexpected weight change.  HENT: Negative.   Eyes: Negative.   Respiratory: Negative for cough, chest tightness, shortness of breath and wheezing.   Cardiovascular: Negative for chest pain, palpitations and leg swelling.  Gastrointestinal: Negative for abdominal pain, constipation, diarrhea, nausea and vomiting.  Endocrine: Negative.  Negative for polydipsia, polyphagia and polyuria.  Genitourinary: Negative for difficulty urinating.  Musculoskeletal: Positive for arthralgias. Negative for myalgias.  Skin: Negative.  Negative for color change and pallor.  Neurological: Negative.  Negative for dizziness, weakness and light-headedness.  Hematological: Negative for adenopathy. Does not bruise/bleed easily.  Psychiatric/Behavioral: Negative.     Objective:  BP 138/80   Pulse 71   Temp 98.2 F (36.8 C) (Oral)   Resp 16   Ht 6' (1.829 m)   Wt 193 lb (  87.5 kg)   SpO2 98%   BMI 26.18 kg/m   BP Readings from Last 3 Encounters:  02/11/20 138/80  09/23/19 (!) 158/72  06/29/19 118/64    Wt Readings from Last 3 Encounters:  02/11/20 193 lb (87.5 kg)  09/23/19 198 lb 4 oz (89.9 kg)  06/29/19 200 lb (90.7 kg)    Physical Exam Vitals reviewed.  HENT:     Nose: Nose normal.     Mouth/Throat:     Mouth: Mucous membranes are moist.  Eyes:     General: No scleral icterus.    Conjunctiva/sclera: Conjunctivae normal.  Cardiovascular:     Rate and  Rhythm: Normal rate and regular rhythm.     Heart sounds: No murmur heard.   Pulmonary:     Effort: Pulmonary effort is normal.     Breath sounds: No stridor. No wheezing, rhonchi or rales.  Abdominal:     General: Abdomen is flat. Bowel sounds are normal. There is no distension.     Palpations: Abdomen is soft. There is no hepatomegaly, splenomegaly or mass.     Tenderness: There is no abdominal tenderness.  Musculoskeletal:        General: No tenderness. Normal range of motion.     Cervical back: Neck supple.  Lymphadenopathy:     Cervical: No cervical adenopathy.  Skin:    General: Skin is warm and dry.  Neurological:     General: No focal deficit present.     Mental Status: He is alert.  Psychiatric:        Mood and Affect: Mood normal.        Behavior: Behavior normal.     Lab Results  Component Value Date   WBC 9.1 02/11/2020   HGB 12.7 (L) 02/11/2020   HCT 38.0 (L) 02/11/2020   PLT 102.0 (L) 02/11/2020   GLUCOSE 125 (H) 02/11/2020   CHOL 128 09/23/2019   TRIG 91 09/23/2019   HDL 36 (L) 09/23/2019   LDLDIRECT 80.6 06/26/2010   LDLCALC 75 09/23/2019   ALT 13 03/30/2018   AST 16 03/30/2018   NA 143 02/11/2020   K 5.1 02/11/2020   CL 110 02/11/2020   CREATININE 2.82 (H) 02/11/2020   BUN 42 (H) 02/11/2020   CO2 26 02/11/2020   TSH 1.45 09/30/2018   PSA 0.03 (L) 06/29/2009   INR 1.1 04/08/2008   HGBA1C 9.7 (H) 02/11/2020   MICROALBUR 2.4 09/23/2019    CT Head Wo Contrast  Result Date: 11/04/2017 CLINICAL DATA:  Acute headache, worst headache of life, right-sided, prior subdural hematoma years ago EXAM: CT HEAD WITHOUT CONTRAST TECHNIQUE: Contiguous axial images were obtained from the base of the skull through the vertex without intravenous contrast. COMPARISON:  CT brain scan of 08/29/2016 FINDINGS: Brain: The ventricular system remains prominent as are the cortical sulci indicative of diffuse atrophy. The septum is midline in position. Mild small vessel  ischemic change remains throughout the periventricular white matter. No hemorrhage, mass lesion, or acute infarction is seen. Vascular: No vascular abnormality is evident on this unenhanced study. Skull: Changes of right parietal craniotomy again are noted. No complicating features are seen. Sinuses/Orbits: The paranasal sinuses appear well pneumatized. Other: None. IMPRESSION: 1. No change in atrophy and mild small vessel ischemic change in the periventricular white matter. 2. Stable right parietal craniotomy. Electronically Signed   By: Ivar Drape M.D.   On: 11/04/2017 10:24    Assessment & Plan:   Atom was seen today for  anemia, hypertension and diabetes.  Diagnoses and all orders for this visit:  DM (diabetes mellitus), type 2, uncontrolled, periph vascular complications ([place])- His A1c is up to 9.7%.  I have asked him to be more compliant with his insulin regimen. -     insulin NPH Human (NOVOLIN N RELION) 100 UNIT/ML injection; 80 units each morning, and 30 units each evening. -     Hemoglobin A1c; Future -     Glucagon (GVOKE HYPOPEN 2-PACK) 1 MG/0.2ML SOAJ; Inject 1 Act into the skin daily as needed. -     Hemoglobin A1c -     Amb Referral to Nutrition and Diabetic Education -     Ambulatory referral to Endocrinology -     Consult to [place] Management  Essential hypertension- His blood pressure is adequately well controlled. -     Basic metabolic panel; Future -     Urinalysis, Routine w reflex microscopic; Future -     Urinalysis, Routine w reflex microscopic -     Basic metabolic panel  CRI (chronic renal insufficiency), stage 3 (moderate) (HCC)- He has a prerenal azotemia so I have asked him to stop taking hydrochlorothiazide. -     Basic metabolic panel; Future -     Urinalysis, Routine w reflex microscopic; Future -     Urinalysis, Routine w reflex microscopic -     Basic metabolic panel -     Consult to [facility] Care Management  ANEMIA, B12 DEFICIENCY- His H&H have improved  and his folate level is normal.  I recommended that he continue monthly B12 replacement therapy. -     CBC with Differential/Platelet; Future -     Folate; Future -     cyanocobalamin ((VITAMIN B-12)) injection 1,000 mcg -     Folate -     CBC with Differential/Platelet   I have discontinued Giorgio Kantz's hydrochlorothiazide. I am also having him start on Gvoke HypoPen 2-Pack. Additionally, I am having him maintain his nitroGLYCERIN, acetaminophen, aspirin EC, OneTouch Verio, OneTouch Delica Plus [id number], metoprolol tartrate, omeprazole, telmisartan, allopurinol, Belsomra, B-D INS SYR ULTRAFINE 1CC/30G, isosorbide mononitrate, atorvastatin, and insulin NPH Human. We administered cyanocobalamin. We will continue to administer cyanocobalamin.  Meds ordered this encounter  Medications  . insulin NPH Human (NOVOLIN N RELION) 100 UNIT/ML injection    Sig: 80 units each morning, and 30 units each evening.    Dispense:  90 mL    Refill:  3  . Glucagon (GVOKE HYPOPEN 2-PACK) 1 MG/0.2ML SOAJ    Sig: Inject 1 Act into the skin daily as needed.    Dispense:  2 mL    Refill:  5  . cyanocobalamin ((VITAMIN B-12)) injection 1,000 mcg     Follow-up: Return in about 6 months (around 08/10/2020).  [name], MD

## 2020-02-11 NOTE — Patient Instructions (Signed)
Type 1 Diabetes Mellitus, Diagnosis, Adult  Type 1 diabetes (type 1 diabetes mellitus) is a long-term, or chronic, disease. It occurs when the cells in the pancreas (beta cells) that make a hormone called insulin are destroyed. Normally, insulin allows blood sugar, also called glucose, to enter cells in the body. The cells use glucose for energy. Lack of insulin causes excess glucose to build up in the blood instead of going into cells. As a result, high blood glucose, or hyperglycemia, develops. There is currently no cure for type 1 diabetes, but it can be managed with insulin therapy and lifestyle changes. What are the causes? The exact cause of type 1 diabetes is not known. What increases the risk? You may be more likely to develop this condition if you have a family member who has type 1 diabetes. Other factors may also make you more likely to develop type 1 diabetes, such as:  Having a gene for type 1 diabetes that is passed from parent to child (inherited).  Having autoimmune disorders. These are conditions in which the body's disease-fighting system (immunesystem) attacks itself.  Being exposed to certain viruses.  Living in an area with cold weather conditions. What are the signs or symptoms? Symptoms may develop slowly over days or weeks, or they may develop suddenly. Symptoms may include:  Increased thirst or hunger.  Increased urination or increased urination during the night.  Sudden or unexplained weight changes.  Tiredness (fatigue) or weakness.  Vision changes, such as blurry vision. How is this diagnosed? This condition is diagnosed based on your symptoms, your medical history, a physical exam, and your blood glucose level. Your blood glucose may be checked with one or more of the following blood tests:  A fasting blood glucose (FBG) test. You will not be allowed to eat (you will fast) for 8 hours or longer before a blood sample is taken.  A random blood glucose test.  This checks blood glucose at any time of day no matter when you ate.  An A1C (hemoglobin A1C) blood test. This gives information about blood glucose control over the last 2-3 months. You may be diagnosed with type 1 diabetes if:  Your FBG level is 126 mg/dL (7 mmol/L) or higher.  Your random blood glucose level is 200 mg/dL (11.1 mmol/L) or higher.  Your hemoglobin A1C level is 6.5% or higher. These blood tests may be repeated to confirm your diagnosis.   How is this treated? This condition may be managed by a specialist called an endocrinologist. You can help manage your type 1 diabetes by following instructions from your health care provider about:  Taking insulin daily. This helps to keep your blood glucose levels in the healthy range.  Taking medicines to help prevent complications from diabetes. These may include: ? Aspirin. ? Medicine to lower cholesterol. ? Medicine to control blood pressure.  Checking your blood glucose as often as told.  Making diet and lifestyle changes. These may include: ? Following an individualized nutrition plan that is developed by a registered dietitian. ? Getting regular exercise. ? Finding ways to manage stress. Your health care provider will set individualized treatment goals for you. Your goals will be based on your age, other medical conditions you have, and how you respond to diabetes treatment. In general, the goal of treatment is to maintain the following blood glucose levels:  Before meals (preprandial): 80-130 mg/dL (4.4-7.2 mmol/L).  After meals (postprandial): below 180 mg/dL (10 mmol/L).  Hemoglobin A1C level: less than  7%. Follow these instructions at home: Questions to ask your health care provider Consider asking the following questions:  Do I need to meet with a certified diabetes care and education specialist?  Should I consider joining a support group for people with diabetes?  What equipment will I need to manage my  diabetes at home?  What diabetes medicines should I take, and when?  How often should I check my blood glucose?  What number should I call if I have questions?  When is my next appointment? General instructions  Take over-the-counter and prescription medicines only as told by your health care provider.  Keep all follow-up visits as told by your health care provider. This is important. Where to find more information  American Diabetes Association (ADA): diabetes.org  Association of Diabetes Care and Education Specialists (ADCES): diabeteseducator.org  International Diabetes Federation (IDF): https://www.munoz-bell.org/ Contact a health care provider if:  Your blood glucose level is higher than 240 mg/dL (13.3 mmol/L) for 2 days in a row.  You have been sick or have had a fever for 2 days or more and you are not getting better.  You have any of the following problems for more than 6 hours: ? You cannot eat or drink. ? You have nausea and vomiting. ? You have diarrhea. Get help right away if:  Your blood glucose is below 54 mg/dL (3 mmol/L).  You become confused or you have trouble thinking clearly.  You have trouble breathing. These symptoms may represent a serious problem that is an emergency. Do not wait to see if the symptoms will go away. Get medical help right away. Call your local emergency services (911 in the [place] Do not drive yourself to the hospital. Summary  Type 1 diabetes (type 1 diabetes mellitus) is a long-term, or chronic, disease. It occurs when the cells in the pancreas (beta cells) that make a hormone called insulin are destroyed. There is currently no cure for type 1 diabetes.  The exact cause of type 1 diabetes is not known.  This condition is treated by taking insulin and other medicines to help prevent complications from diabetes. Diet and lifestyle changes are also part of treatment.  Your health care provider will set individualized treatment goals for you. Your  goals will be based on your age, other medical conditions you have, and how you respond to diabetes treatment. This information is not intended to replace advice given to you by your health care provider. Make sure you discuss any questions you have with your health care provider. Document Revised: 02/11/2019 Document Reviewed: 02/11/2019 Elsevier Patient Education  2021 [employer].

## 2020-02-12 MED ORDER — FREESTYLE LIBRE 2 SENSOR MISC
1.0000 | Freq: Every day | 5 refills | Status: DC
Start: 2020-02-12 — End: 2020-10-31

## 2020-02-12 MED ORDER — FREESTYLE LIBRE 2 READER DEVI
1.0000 | Freq: Every day | 5 refills | Status: DC
Start: 2020-02-12 — End: 2020-10-31

## 2020-02-22 ENCOUNTER — Other Ambulatory Visit: Payer: Self-pay | Admitting: Endocrinology

## 2020-02-23 ENCOUNTER — Other Ambulatory Visit: Payer: Self-pay | Admitting: *Deleted

## 2020-02-23 NOTE — Patient Outreach (Signed)
[facility]) Care Management  02/23/2020  Camran Keady 07-21-1930 [id number]   [facility] Unsuccessful outreach  Mr Tomy Khim was referred by Velora Heckler MD, [name] on 02/14/20 for MD referral for poorly controlled Diabetes (DM) type 2    Last by primary care provider (PCP) on 02/11/20 HgA1c per Epic was 9.7 on 02/11/20 and was 8.6 on 09/23/19 was 7.9 on 06/29/19 & 03/17/19 Outreach attempt to the home number [phone] x [phone] No answer. Invalid number  Plan: [facility] RN CM scheduled this patient for another call attempt within 4-7 business days prn after consultation with [facility] RN CM Melina Modena L. [name], RN, BSN, [place] Coordinator Office number [phone] Mobile number [phone]  Main [facility] number [phone] Fax number [phone]

## 2020-03-01 ENCOUNTER — Other Ambulatory Visit: Payer: Self-pay | Admitting: *Deleted

## 2020-03-01 ENCOUNTER — Encounter: Payer: Self-pay | Admitting: *Deleted

## 2020-03-01 ENCOUNTER — Other Ambulatory Visit: Payer: Self-pay

## 2020-03-01 NOTE — Patient Outreach (Signed)
[facility]) Care Management  03/01/2020  Lavance Beazer [birth year] [id number]  [facility] outreach to MD referred patient Mr Barnes Florek was referred by Velora Heckler MD, [name] on 02/14/20 for MD referral for poorly controlled Diabetes (DM) type 2   Last by primary care provider (PCP) on 02/11/20 HgA1c per Epic was 9.7 on 02/11/20 and was 8.6 on 09/23/19 was 7.9 on 06/29/19 & 03/17/19  Outreach attempt to the home number [phone] x [phone] No answer. Invalid number Outreach to PCP office and spoke with Alwyn Ren who offered a mobile number Outreach successful to patient using [phone]  Patient is able to verify HIPAA ([place] and Accountability Act) identifiers Reviewed and addressed the purpose of the follow up call with the patient  Consent: [facility] RN CM reviewed [facility] services with patient. Patient gave verbal consent for services. Mr Amory confirms [phone] is his home number and [phone] is an old home number. EPIC updated    Diabetes assessment Mr Seidman reports seeing Dr Ronnald Ramp who manages his Diabetes (DM) type 2 on 02/11/20 He no longer is seen by endocrinologist Dr Loanne Drilling He and [facility] RN CM discussed the elevation of his HgA1c to 9.7 "I know what to do about that" He refers to adjustments in his oral intake With assessment of home management for today he reports a low CBG value of 66 this morning He reported feeling confused, weak, "worried" and had Orange juice and candy to increase the CBG value [facility] RN CM discussed the intake of protein vs orange juice   Minimal exercise related to age and mobility per pt   Discussed with pt the start of complex care and transition to chronic care services at a later time Plan Patient agrees to care plan and follow up within the next 14-21 business days  Goals Addressed              This Visit's Progress     Patient Stated   .  [facility]) Monitor and Manage My Blood  Sugar-Diabetes Type 2 (pt-stated)   On track     Timeframe:  Short-Term Goal Priority:  High Start Date:                 03/01/20            Expected End Date:      04/06/20                 Follow Up Date 03/15/20   - check blood sugar at prescribed times - enter blood sugar readings and medication or insulin into daily log       Notes: 03/01/20 checked CBG values and log today Discussed with [facility] RN CM  Reviewed hypoglycemia action plan and voiced appreciation of additional intervention to reduce hypoglycemia    .  [facility]) Set My Target A1C-Diabetes Type 2 (pt-stated)   On track     Timeframe:  Long-Range Goal Priority:  High Start Date:                 03/01/20            Expected End Date:         06/06/20            Follow Up Date 03/15/20   - set target A1C 7 and <      Notes: 03/01/20 agree to work to reduction of A1c of 7  orr <       Haidyn Chadderdon L. [name], RN, BSN, [place] Coordinator Office number [phone] Main [facility] number [phone] Fax number [phone]

## 2020-03-02 ENCOUNTER — Other Ambulatory Visit: Payer: Self-pay | Admitting: *Deleted

## 2020-03-02 NOTE — Patient Outreach (Signed)
[facility] [facility] [facility]) Care Management  03/02/2020  Robert Clayton April 06, 1930 [id number]   [name] coordination- EMMI education  Sent EMMI diabetes education via mail to patient as discussed EMMIs for Diabetes and diet Diabetic meal planning The new food label Guide to eating when you have diabetes     Plan follow up within the next 14-21 business days   [name] L. [name], RN, BSN, [place] Coordinator Office number [phone] Mobile number [phone]  Main [facility] number [phone] Fax number [phone]

## 2020-03-06 ENCOUNTER — Other Ambulatory Visit: Payer: Self-pay

## 2020-03-08 ENCOUNTER — Ambulatory Visit: Payer: Medicare Other | Admitting: Endocrinology

## 2020-03-09 ENCOUNTER — Encounter: Payer: Medicare Other | Attending: Internal Medicine | Admitting: Registered"

## 2020-03-15 ENCOUNTER — Other Ambulatory Visit: Payer: Self-pay | Admitting: *Deleted

## 2020-03-15 ENCOUNTER — Other Ambulatory Visit: Payer: Self-pay

## 2020-03-15 NOTE — Patient Outreach (Signed)
[facility]) Care Management  03/15/2020  Robert Clayton 1930-02-18 [id number]   [facility] outreach to MD referred patient [name] was referred by Velora Heckler MD, [name] on 02/14/20 for MD referral for poorly controlledDiabetes (DM) type 2   Last byprimary care provider (PCP)on 02/11/20 HgA1c per Epic was 9.7 on 02/11/20 and was 8.6 on 09/23/19 was 7.9 on 06/29/19 & 03/17/19  Outreach attempt to the home [phone] x [phone] No answer.Invalid number Outreach to PCP office and spoke with Alwyn Ren who offered a mobile number Outreach successful to patient using [phone]  Patient is able to verify HIPAA ([name] and Accountability Act) identifiers Reviewed and addressed the purpose of the follow up call with the patient  Consent: [facility] RN CM reviewed [facility] services with patient. Patient gave verbal consent for services. Mr Colberg confirms [phone] is his home number and [phone] is an old home number. EPIC updated    Diabetes assessment Reviewed EMMIs in detail and allowed lots of questions and answers to individualize this education EMMIs on diabetes and diet, diabetic meal planning, the new food label and guide to eating when you have diabetes Discussed what he can eat and encouraged him to watch portion size and to balance his diet  He was encouraged to make better choice  He confirms his home diet barrier is that he takes in too much of poor choices of food items and does not like to get in enough fluids  Items he likes includes coffee, bowls full of popcorn and butter pecan ice cream Reviewed bagged portion size pop corn, low carb, low sugar, low calorie butter pecan ice cream choices or portion sized ice bars/sandwiches  [name] cooks his own food  Pt voice appreciation of services provided  Minimal exercise related to age and mobility per pt   Discussed with pt the start of complex care and  transition to chronic care services  He agrees to chronic care services    Plan Patient agrees to care plan and warm transfer to [facility] embedded RN CM (chronic care services)  Pt encouraged to return a call to [facility] RN CM prn  Goals Addressed              This Visit's Progress     Patient Stated   .  COMPLETED: ([facility]) Monitor and Manage My Blood Sugar-Diabetes Type 2 (pt-stated)   On track     Timeframe:  Short-Term Goal Priority:  High Start Date:                 03/01/20            Expected End Date:      04/06/20                 Follow Up Date pending [facility] chronic services   - check blood sugar at prescribed times - enter blood sugar readings and medication or insulin into daily log     Notes: 03/15/20 continue to check CBG values and take medications as ordered Participated with diabetes education today from [place] CM  Warm transfer to [facility] chronic CM services  03/01/20 checked CBG values and log today Discussed with [facility] RN CM  Reviewed hypoglycemia action plan and voiced appreciation of additional intervention to reduce hypoglycemia    .  COMPLETED: [facility]) Set My Target A1C-Diabetes Type 2 (pt-stated)   On track  Timeframe:  Long-Range Goal Priority:  High Start Date:                 03/01/20            Expected End Date:         06/06/20            Follow Up Date pending [facility] chronic services   - set target A1C 7 and <   Notes: 03/15/20 continue to check CBG values and take medications as ordered Participated with diabetes education today from [place] CM  Warm transfer to [facility] chronic CM services  03/01/20 agree to work to reduction of A1c of 7 or <        Fabian Coca L. [name], RN, BSN, [place] Coordinator Office number [phone] Main [facility] number [phone] Fax number [phone]

## 2020-03-16 ENCOUNTER — Ambulatory Visit (INDEPENDENT_AMBULATORY_CARE_PROVIDER_SITE_OTHER): Payer: Medicare Other

## 2020-03-16 ENCOUNTER — Ambulatory Visit: Payer: Medicare Other

## 2020-03-16 ENCOUNTER — Other Ambulatory Visit: Payer: Self-pay

## 2020-03-16 DIAGNOSIS — E538 Deficiency of other specified B group vitamins: Secondary | ICD-10-CM

## 2020-03-16 NOTE — Progress Notes (Signed)
B12 given

## 2020-03-21 ENCOUNTER — Encounter: Payer: Self-pay | Admitting: *Deleted

## 2020-03-28 ENCOUNTER — Ambulatory Visit: Payer: Self-pay | Admitting: *Deleted

## 2020-03-28 ENCOUNTER — Encounter: Payer: Self-pay | Admitting: *Deleted

## 2020-03-28 NOTE — Patient Instructions (Signed)
Visit Information  As a part of your Medicare benefit, you are eligible for care management and care coordination services. I was unable to reach you by phone today but would like to speak with you about your health related needs. Please call me at [place], RN, BSN, [facility] RN Care Coordination- [place] [phone]: direct office [phone]: mobile

## 2020-03-28 NOTE — Chronic Care Management (AMB) (Signed)
  Chronic Care Management   Outreach Note  03/28/2020 Name: Robert Clayton MRN: [id number] DOB: 01/29/1930  Referred by: Janith Lima, MD Reason for referral : Chronic Care Management (Unsuccessful telephone outreach attempt)  An unsuccessful initial telephone outreach was attempted today. The patient was referred to the case management team for assistance with care management and care coordination.  Patient was previously active with [facility] CM Central team.  Follow Up Plan: The patient has been provided with contact information for the care management team and has been advised to call with any health related questions or concerns.   Oneta Rack, RN, BSN, [facility] RN Care Coordination- [place] [phone]: direct office [phone]: mobile

## 2020-03-29 ENCOUNTER — Other Ambulatory Visit: Payer: Self-pay | Admitting: Internal Medicine

## 2020-03-29 ENCOUNTER — Encounter: Payer: Self-pay | Admitting: Podiatry

## 2020-03-29 ENCOUNTER — Other Ambulatory Visit: Payer: Self-pay

## 2020-03-29 ENCOUNTER — Ambulatory Visit (INDEPENDENT_AMBULATORY_CARE_PROVIDER_SITE_OTHER): Payer: Medicare Other | Admitting: Podiatry

## 2020-03-29 DIAGNOSIS — B351 Tinea unguium: Secondary | ICD-10-CM | POA: Diagnosis not present

## 2020-03-29 DIAGNOSIS — M79675 Pain in left toe(s): Secondary | ICD-10-CM | POA: Diagnosis not present

## 2020-03-29 DIAGNOSIS — E1165 Type 2 diabetes mellitus with hyperglycemia: Secondary | ICD-10-CM | POA: Diagnosis not present

## 2020-03-29 DIAGNOSIS — N183 Chronic kidney disease, stage 3 unspecified: Secondary | ICD-10-CM

## 2020-03-29 DIAGNOSIS — E1151 Type 2 diabetes mellitus with diabetic peripheral angiopathy without gangrene: Secondary | ICD-10-CM | POA: Diagnosis not present

## 2020-03-29 DIAGNOSIS — I872 Venous insufficiency (chronic) (peripheral): Secondary | ICD-10-CM

## 2020-03-29 DIAGNOSIS — IMO0002 Reserved for concepts with insufficient information to code with codable children: Secondary | ICD-10-CM

## 2020-03-29 DIAGNOSIS — M79674 Pain in right toe(s): Secondary | ICD-10-CM

## 2020-03-29 NOTE — Progress Notes (Signed)
This patient returns to my office for at risk foot care.  This patient requires this care by a professional since this patient will be at risk due to having diabetic mellitus with pvd and diabetic neuropathy and venous insufficiency and CKD.  This patient is unable to cut nails himself since the patient cannot reach his nails.These nails are painful walking and wearing shoes.  This patient presents for at risk foot care today.  General Appearance  Alert, conversant and in no acute stress.  Vascular  Dorsalis pedis and posterior tibial  pulses are weakly  palpable  bilaterally.  Capillary return is within normal limits  bilaterally. Temperature is within normal limits  bilaterally.  Neurologic  Senn-Weinstein monofilament wire test diminished bilaterally. Muscle power within normal limits bilaterally.  Nails Thick disfigured discolored nails with subungual debris  from hallux to fifth toes bilaterally. No evidence of bacterial infection or drainage bilaterally.  Orthopedic  No limitations of motion  feet .  No crepitus or effusions noted. HAV  B/L.  Plantar flexed fifth metatarsal right foot.  Skin  normotropic skin with  noted bilaterally.  No signs of infections or ulcers noted.   Porokeratosis sub 5th met right foot.  Onychomycosis  Pain in right toes  Pain in left toes.  Porokeratosis right foot.  Consent was obtained for treatment procedures.   Mechanical debridement of nails 1-5  bilaterally performed with a nail nipper.  Filed with dremel without incident. Debride porokeratosis right foot with # 15 blade.. Patient requests diabetic shoes.  Patient qualifies for diabetic shoes due to DPN and HAV and pf fifth met right foot.  Patient will be called to schedule his shoe fitting.   Return office visit    3 mos.                 Told patient to return for periodic foot care and evaluation due to potential at risk complications.   Gardiner Barefoot DPM

## 2020-04-06 ENCOUNTER — Ambulatory Visit: Payer: Self-pay | Admitting: *Deleted

## 2020-04-06 NOTE — Patient Instructions (Signed)
Visit Information  Thank you for allowing me to share the care management and care coordination services that are available to you as part of your health plan and services through your primary care provider and medical home. Please reach out to me at [phone] if the care management/care coordination team may be of assistance to you in the future.   Oneta Rack, RN, BSN, [facility] RN Care Coordination- [place] [phone]: direct office [phone]: mobile    Diabetes Mellitus and Standards of [place] with and managing diabetes (diabetes mellitus) can be complicated. Your diabetes treatment may be managed by a team of health care providers, including:  A physician who specializes in diabetes (endocrinologist). You might also have visits with a nurse practitioner or physician assistant.  Nurses.  A registered dietitian.  A certified diabetes care and education specialist.  An exercise specialist.  A pharmacist.  An eye doctor.  A foot specialist (podiatrist).  A dental care provider.  A primary care provider.  A mental health care provider. How to manage your diabetes You can do many things to successfully manage your diabetes. Your health care providers will follow guidelines to help you get the best quality of care. Here are general guidelines for your diabetes management plan. Your health care providers may give you more specific instructions. Physical exams When you are diagnosed with diabetes, and each year after that, your health care provider will ask about your medical and family history. You will have a physical exam, which may include:  Measuring your height, weight, and body mass index (BMI).  Checking your blood pressure. This will be done at every routine medical visit. Your target blood pressure may vary depending on your medical conditions, your age, and other factors.  A thyroid exam.  A skin  exam.  Screening for nerve damage (peripheral neuropathy). This may include checking the pulse in your legs and feet and the level of sensation in your hands and feet.  A foot exam to inspect the structure and skin of your feet, including checking for cuts, bruises, redness, blisters, sores, or other problems.  Screening for blood vessel (vascular) problems. This may include checking the pulse in your legs and feet and checking your temperature. Blood tests Depending on your treatment plan and your personal needs, you may have the following tests:  Hemoglobin A1C (HbA1C). This test provides information about blood sugar (glucose) control over the previous 2-3 months. It is used to adjust your treatment plan, if needed. This test will be done: ? At least 2 times a year, if you are meeting your treatment goals. ? 4 times a year, if you are not meeting your treatment goals or if your goals have changed.  Lipid testing, including total cholesterol, LDL and HDL cholesterol, and triglyceride levels. ? The goal for LDL is less than 100 mg/dL (5.5 mmol/L). If you are at high risk for complications, the goal is less than 70 mg/dL (3.9 mmol/L). ? The goal for HDL is 40 mg/dL (2.2 mmol/L) or higher for men, and 50 mg/dL (2.8 mmol/L) or higher for women. An HDL cholesterol of 60 mg/dL (3.3 mmol/L) or higher gives some protection against heart disease. ? The goal for triglycerides is less than 150 mg/dL (8.3 mmol/L).  Liver function tests.  Kidney function tests.  Thyroid function tests.   Dental and eye exams  Visit your dentist two times a year.  If you have type 1 diabetes,  your health care provider may recommend an eye exam within 5 years after you are diagnosed, and then once a year after your first exam. ? For children with type 1 diabetes, the health care provider may recommend an eye exam when your child is age 91 or older and has had diabetes for 3-5 years. After the first exam, your child  should get an eye exam once a year.  If you have type 2 diabetes, your health care provider may recommend an eye exam as soon as you are diagnosed, and then every 1-2 years after your first exam.   Immunizations  A yearly flu (influenza) vaccine is recommended annually for everyone 6 months or older. This is especially important if you have diabetes.  The pneumonia (pneumococcal) vaccine is recommended for everyone 2 years or older who has diabetes. If you are age 19 or older, you may get the pneumonia vaccine as a series of two separate shots.  The hepatitis B vaccine is recommended for adults shortly after being diagnosed with diabetes. Adults and children with diabetes should receive all other vaccines according to age-specific recommendations from the Centers for Disease Control and Prevention (CDC). Mental and emotional health Screening for symptoms of eating disorders, anxiety, and depression is recommended at the time of diagnosis and after as needed. If your screening shows that you have symptoms, you may need more evaluation. You may work with a mental health care provider. Follow these instructions at home: Treatment plan You will monitor your blood glucose levels and may give yourself insulin. Your treatment plan will be reviewed at every medical visit. You and your health care provider will discuss:  How you are taking your medicines, including insulin.  Any side effects you have.  Your blood glucose level target goals.  How often you monitor your blood glucose level.  Lifestyle habits, such as activity level and tobacco, alcohol, and substance use. Education Your health care provider will assess how well you are monitoring your blood glucose levels and whether you are taking your insulin and medicines correctly. He or she may refer you to:  A certified diabetes care and education specialist to manage your diabetes throughout your life, starting at diagnosis.  A registered  dietitian who can create and review your personal nutrition plan.  An exercise specialist who can discuss your activity level and exercise plan. General instructions  Take over-the-counter and prescription medicines only as told by your health care provider.  Keep all follow-up visits. This is important. Where to find support There are many diabetes support networks, including:  American Diabetes Association (ADA): diabetes.org  Defeat Diabetes Foundation: defeatdiabetes.org Where to find more information  American Diabetes Association (ADA): www.diabetes.org  Association of Diabetes Care & Education Specialists (ADCES): diabeteseducator.org  International Diabetes Federation (IDF): https://www.munoz-bell.org/ Summary  Managing diabetes (diabetes mellitus) can be complicated. Your diabetes treatment may be managed by a team of health care providers.  Your health care providers follow guidelines to help you get the best quality care.  You should have physical exams, blood tests, blood pressure monitoring, immunizations, and screening tests regularly. Stay updated on how to manage your diabetes.  Your health care providers may also give you more specific instructions based on your individual health. This information is not intended to replace advice given to you by your health care provider. Make sure you discuss any questions you have with your health care provider. Document Revised: 07/01/2019 Document Reviewed: 07/01/2019 Elsevier Patient Education  [place].

## 2020-04-06 NOTE — Chronic Care Management (AMB) (Signed)
  Chronic Care Management   CCM RN Visit Note  04/06/2020 Name: Amjad Fikes MRN: [id number] DOB: 11-11-1930  Subjective: Monty Spicher is a 87 y.o. year old male who is a primary care patient of Janith Lima, MD. The care management team was consulted for assistance with disease management and care coordination needs.    Engaged with patient by telephone for CCM enrollment/ scheduling of initial visit in response to provider referral for case management and/or care coordination services. Patient was previously active with [facility] CM Central team.  Consent to Services:  The patient was given the following information about Chronic Care Management services today, and declines services  Plan:  The patient has been provided with contact information for the care management team and has been advised to call with any health related questions or concerns  PCP made aware that patient has declined CCM services today; will re-engage with patient in future should patient wish to re-consider/ participate   Oneta Rack, RN, BSN, [place] [phone]: direct office [phone]: mobile

## 2020-04-14 ENCOUNTER — Ambulatory Visit (INDEPENDENT_AMBULATORY_CARE_PROVIDER_SITE_OTHER): Payer: Medicare Other

## 2020-04-14 ENCOUNTER — Other Ambulatory Visit: Payer: Self-pay

## 2020-04-14 DIAGNOSIS — E538 Deficiency of other specified B group vitamins: Secondary | ICD-10-CM

## 2020-04-14 MED ORDER — CYANOCOBALAMIN 1000 MCG/ML IJ SOLN
1000.0000 ug | Freq: Once | INTRAMUSCULAR | Status: AC
  Administered 2020-04-14: 1000 ug via INTRAMUSCULAR

## 2020-04-14 NOTE — Progress Notes (Signed)
Pt here for monthly B12 injection per Dr. Ronnald Ramp  B12 1057mcg given IM right deltoid, and pt tolerated injection well.  Next B12 injection scheduled for 05/17/20

## 2020-04-19 ENCOUNTER — Telehealth: Payer: Self-pay | Admitting: Podiatry

## 2020-04-19 NOTE — Telephone Encounter (Signed)
LVM for patient to return call to get scheduled for casting of diabetic shoes

## 2020-04-24 ENCOUNTER — Other Ambulatory Visit: Payer: Self-pay

## 2020-04-24 ENCOUNTER — Ambulatory Visit (INDEPENDENT_AMBULATORY_CARE_PROVIDER_SITE_OTHER): Payer: Medicare Other | Admitting: Podiatry

## 2020-04-24 DIAGNOSIS — IMO0002 Reserved for concepts with insufficient information to code with codable children: Secondary | ICD-10-CM

## 2020-04-24 DIAGNOSIS — I872 Venous insufficiency (chronic) (peripheral): Secondary | ICD-10-CM

## 2020-04-24 DIAGNOSIS — E1165 Type 2 diabetes mellitus with hyperglycemia: Secondary | ICD-10-CM

## 2020-04-24 DIAGNOSIS — E1151 Type 2 diabetes mellitus with diabetic peripheral angiopathy without gangrene: Secondary | ICD-10-CM

## 2020-04-24 NOTE — Progress Notes (Addendum)
Patient presented for foam casting for 3 pair custom diabetic shoes inserts. Patient is measured with a [employer] Device to be a size 12 wide.  Diabetic shoes are chosen from the [employer] Catalog. The shoes chosen are [id number].  The patient will be contacted when the shoes and inserts are ready for pick up  Gardiner Barefoot DPM

## 2020-04-25 ENCOUNTER — Encounter: Payer: Self-pay | Admitting: Podiatry

## 2020-05-10 ENCOUNTER — Other Ambulatory Visit: Payer: Self-pay | Admitting: Internal Medicine

## 2020-05-10 DIAGNOSIS — I129 Hypertensive chronic kidney disease with stage 1 through stage 4 chronic kidney disease, or unspecified chronic kidney disease: Secondary | ICD-10-CM

## 2020-05-17 ENCOUNTER — Other Ambulatory Visit: Payer: Self-pay

## 2020-05-17 ENCOUNTER — Ambulatory Visit (INDEPENDENT_AMBULATORY_CARE_PROVIDER_SITE_OTHER): Payer: Medicare Other

## 2020-05-17 DIAGNOSIS — E538 Deficiency of other specified B group vitamins: Secondary | ICD-10-CM | POA: Diagnosis not present

## 2020-05-17 NOTE — Progress Notes (Addendum)
Patient came into the office to receive his vitamin b12 injection. He tolerated the injection well. No questions or concerns.   I have reviewed and agree

## 2020-05-23 ENCOUNTER — Other Ambulatory Visit: Payer: Self-pay | Admitting: Internal Medicine

## 2020-05-23 ENCOUNTER — Other Ambulatory Visit: Payer: Self-pay | Admitting: Endocrinology

## 2020-05-23 DIAGNOSIS — I25110 Atherosclerotic heart disease of native coronary artery with unstable angina pectoris: Secondary | ICD-10-CM

## 2020-05-29 ENCOUNTER — Other Ambulatory Visit: Payer: Self-pay | Admitting: Internal Medicine

## 2020-05-29 ENCOUNTER — Telehealth: Payer: Self-pay | Admitting: Internal Medicine

## 2020-05-29 DIAGNOSIS — Z794 Long term (current) use of insulin: Secondary | ICD-10-CM

## 2020-05-29 DIAGNOSIS — IMO0002 Reserved for concepts with insufficient information to code with codable children: Secondary | ICD-10-CM

## 2020-05-29 DIAGNOSIS — E119 Type 2 diabetes mellitus without complications: Secondary | ICD-10-CM | POA: Insufficient documentation

## 2020-05-29 DIAGNOSIS — E1151 Type 2 diabetes mellitus with diabetic peripheral angiopathy without gangrene: Secondary | ICD-10-CM

## 2020-05-29 MED ORDER — HUMULIN N KWIKPEN 100 UNIT/ML ~~LOC~~ SUPN: PEN_INJECTOR | SUBCUTANEOUS | 1 refills | Status: DC

## 2020-05-29 MED ORDER — INSULIN NPH (HUMAN) (ISOPHANE) 100 UNIT/ML ~~LOC~~ SUSP: SUBCUTANEOUS | 3 refills | Status: DC

## 2020-05-29 NOTE — Telephone Encounter (Signed)
Pt has been informed. He would like a new Rx sent it with the new med change.

## 2020-05-29 NOTE — Telephone Encounter (Signed)
Yes, that is ok 

## 2020-05-29 NOTE — Telephone Encounter (Signed)
    Patient calling to report insurance wants to change his insulin to Humulin. Patient wants to confirm with Dr Ronnald Ramp change is appropriate

## 2020-06-01 MED ORDER — HUMULIN N KWIKPEN 100 UNIT/ML ~~LOC~~ SUPN: PEN_INJECTOR | SUBCUTANEOUS | 1 refills | Status: DC

## 2020-06-01 NOTE — Telephone Encounter (Signed)
Patient called back stating the new Rx needs to be sent through [place] for his insurance to cover the insulin. Stated the insurance sent copy of the letter to our office informing us of the requirements for the insulin to be covered.   Please advise and call back at [phone]

## 2020-06-01 NOTE — Telephone Encounter (Signed)
Pharmacy has been updated and Rx sent.  Pt informed.

## 2020-06-01 NOTE — Addendum Note (Signed)
Addended by: Hinda Kehr on: 06/01/2020 08:50 AM   Modules accepted: Orders

## 2020-06-19 ENCOUNTER — Other Ambulatory Visit: Payer: Self-pay

## 2020-06-19 ENCOUNTER — Ambulatory Visit (INDEPENDENT_AMBULATORY_CARE_PROVIDER_SITE_OTHER): Payer: Medicare Other

## 2020-06-19 DIAGNOSIS — E538 Deficiency of other specified B group vitamins: Secondary | ICD-10-CM | POA: Diagnosis not present

## 2020-06-19 MED ORDER — CYANOCOBALAMIN 1000 MCG/ML IJ SOLN
1000.0000 ug | Freq: Once | INTRAMUSCULAR | Status: AC
  Administered 2020-06-19: 1000 ug via INTRAMUSCULAR

## 2020-06-19 NOTE — Progress Notes (Signed)
Pt here for monthly B12 injection per Dr. Jones  B12 1000mcg given IM, and pt tolerated injection well.  Next B12 injection scheduled for 07/20/20  

## 2020-06-21 ENCOUNTER — Telehealth: Payer: Self-pay | Admitting: Internal Medicine

## 2020-06-21 NOTE — Chronic Care Management (AMB) (Signed)
  Chronic Care Management   Outreach Note  06/21/2020 Name: Robert Clayton MRN: [id number] DOB: 1930/03/30  Referred by: Janith Lima, MD Reason for referral : No chief complaint on file.   An unsuccessful telephone outreach was attempted today. The patient was referred to the pharmacist for assistance with care management and care coordination.   Follow Up Plan:   Lauretta Grill Upstream Scheduler

## 2020-06-23 ENCOUNTER — Other Ambulatory Visit: Payer: Self-pay | Admitting: Internal Medicine

## 2020-06-23 DIAGNOSIS — E785 Hyperlipidemia, unspecified: Secondary | ICD-10-CM

## 2020-06-23 DIAGNOSIS — I2510 Atherosclerotic heart disease of native coronary artery without angina pectoris: Secondary | ICD-10-CM

## 2020-06-23 DIAGNOSIS — IMO0002 Reserved for concepts with insufficient information to code with codable children: Secondary | ICD-10-CM

## 2020-06-26 ENCOUNTER — Ambulatory Visit (INDEPENDENT_AMBULATORY_CARE_PROVIDER_SITE_OTHER): Payer: Medicare Other

## 2020-06-26 ENCOUNTER — Ambulatory Visit (INDEPENDENT_AMBULATORY_CARE_PROVIDER_SITE_OTHER): Payer: Medicare Other | Admitting: Internal Medicine

## 2020-06-26 ENCOUNTER — Other Ambulatory Visit: Payer: Self-pay

## 2020-06-26 ENCOUNTER — Encounter: Payer: Self-pay | Admitting: Internal Medicine

## 2020-06-26 VITALS — BP 142/70 | HR 75 | Temp 98.2°F | Ht 72.0 in | Wt 195.0 lb

## 2020-06-26 DIAGNOSIS — N183 Chronic kidney disease, stage 3 unspecified: Secondary | ICD-10-CM

## 2020-06-26 DIAGNOSIS — E1165 Type 2 diabetes mellitus with hyperglycemia: Secondary | ICD-10-CM

## 2020-06-26 DIAGNOSIS — N3001 Acute cystitis with hematuria: Secondary | ICD-10-CM

## 2020-06-26 DIAGNOSIS — I10 Essential (primary) hypertension: Secondary | ICD-10-CM | POA: Diagnosis not present

## 2020-06-26 DIAGNOSIS — N3080 Other cystitis without hematuria: Secondary | ICD-10-CM

## 2020-06-26 DIAGNOSIS — R8290 Unspecified abnormal findings in urine: Secondary | ICD-10-CM | POA: Diagnosis not present

## 2020-06-26 DIAGNOSIS — E1151 Type 2 diabetes mellitus with diabetic peripheral angiopathy without gangrene: Secondary | ICD-10-CM

## 2020-06-26 DIAGNOSIS — M2012 Hallux valgus (acquired), left foot: Secondary | ICD-10-CM | POA: Diagnosis not present

## 2020-06-26 DIAGNOSIS — IMO0002 Reserved for concepts with insufficient information to code with codable children: Secondary | ICD-10-CM

## 2020-06-26 DIAGNOSIS — D518 Other vitamin B12 deficiency anemias: Secondary | ICD-10-CM

## 2020-06-26 DIAGNOSIS — B965 Pseudomonas (aeruginosa) (mallei) (pseudomallei) as the cause of diseases classified elsewhere: Secondary | ICD-10-CM

## 2020-06-26 DIAGNOSIS — M2011 Hallux valgus (acquired), right foot: Secondary | ICD-10-CM | POA: Diagnosis not present

## 2020-06-26 DIAGNOSIS — L84 Corns and callosities: Secondary | ICD-10-CM | POA: Diagnosis not present

## 2020-06-26 DIAGNOSIS — N2889 Other specified disorders of kidney and ureter: Secondary | ICD-10-CM

## 2020-06-26 LAB — BASIC METABOLIC PANEL
BUN: 42 mg/dL — ABNORMAL HIGH (ref 6–23)
CO2: 22 mEq/L (ref 19–32)
Calcium: 9 mg/dL (ref 8.4–10.5)
Chloride: 113 mEq/L — ABNORMAL HIGH (ref 96–112)
Creatinine, Ser: 2.91 mg/dL — ABNORMAL HIGH (ref 0.40–1.50)
GFR: 18.78 mL/min — ABNORMAL LOW (ref >60.00)
Glucose, Bld: 229 mg/dL — ABNORMAL HIGH (ref 70–99)
Potassium: 5 mEq/L (ref 3.5–5.1)
Sodium: 142 mEq/L (ref 135–145)

## 2020-06-26 LAB — CBC WITH DIFFERENTIAL/PLATELET
Basophils Absolute: 0 10*3/uL (ref 0.0–0.1)
Basophils Relative: 0.6 % (ref 0.0–3.0)
Eosinophils Absolute: 0.2 10*3/uL (ref 0.0–0.7)
Eosinophils Relative: 2.3 % (ref 0.0–5.0)
HCT: 37.5 % — ABNORMAL LOW (ref 39.0–52.0)
Hemoglobin: 12.2 g/dL — ABNORMAL LOW (ref 13.0–17.0)
Lymphocytes Relative: 26.4 % (ref 12.0–46.0)
Lymphs Abs: 2.1 10*3/uL (ref 0.7–4.0)
MCHC: 32.5 g/dL (ref 30.0–36.0)
MCV: 94.1 fl (ref 78.0–100.0)
Monocytes Absolute: 0.8 10*3/uL (ref 0.1–1.0)
Monocytes Relative: 9.5 % (ref 3.0–12.0)
Neutro Abs: 4.9 10*3/uL (ref 1.4–7.7)
Neutrophils Relative %: 61.2 % (ref 43.0–77.0)
Platelets: 68 10*3/uL — ABNORMAL LOW (ref 150.0–400.0)
RBC: 3.98 Mil/uL — ABNORMAL LOW (ref 4.22–5.81)
RDW: 17.5 % — ABNORMAL HIGH (ref 11.5–15.5)
WBC: 8.1 10*3/uL (ref 4.0–10.5)

## 2020-06-26 LAB — POCT URINALYSIS DIPSTICK
Bilirubin, UA: NEGATIVE
Glucose, UA: NEGATIVE
Ketones, UA: NEGATIVE
Nitrite, UA: POSITIVE
Protein, UA: POSITIVE — AB
Spec Grav, UA: 1.02 (ref 1.010–1.025)
Urobilinogen, UA: 0.2 E.U./dL
pH, UA: 6 (ref 5.0–8.0)

## 2020-06-26 LAB — HEMOGLOBIN A1C: Hgb A1c MFr Bld: 8 % — ABNORMAL HIGH (ref 4.6–6.5)

## 2020-06-26 MED ORDER — SULFAMETHOXAZOLE-TRIMETHOPRIM 800-160 MG PO TABS: 1.0000 | ORAL_TABLET | Freq: Two times a day (BID) | ORAL | 0 refills | Status: DC

## 2020-06-26 NOTE — Progress Notes (Signed)
Subjective:  Patient ID: Robert Clayton, male    DOB: [birth year]  Age: 87 y.o. MRN: [id number]  CC: Urinary Tract Infection, Anemia, and Diabetes  This visit occurred during the SARS-CoV-2 public health emergency.  Safety protocols were in place, including screening questions prior to the visit, additional usage of staff PPE, and extensive cleaning of exam room while observing appropriate contact time as indicated for disinfecting solutions.    HPI Robert Clayton presents for f/up -   He complains of a 2-week history of cloudy urine and dysuria.  Outpatient Medications Prior to Visit  Medication Sig Dispense Refill   acetaminophen (TYLENOL) 325 MG tablet Take 650 mg by mouth every 6 (six) hours as needed.     allopurinol (ZYLOPRIM) 300 MG tablet TAKE 1 TABLET BY MOUTH EVERY DAY 90 tablet 1   aspirin EC 81 MG tablet Take 81 mg by mouth daily.     atorvastatin (LIPITOR) 10 MG tablet TAKE 1 TABLET BY MOUTH EVERY DAY 90 tablet 1   B-D INS SYR ULTRAFINE 1CC/30G 30G X 1/2" 1 ML MISC USE AS DIRECTED TWICE A DAY 200 each 3   Continuous Blood Gluc Receiver (FREESTYLE LIBRE 2 READER) DEVI 1 Act by Does not apply route daily. 2 each 5   Continuous Blood Gluc Sensor (FREESTYLE LIBRE 2 SENSOR) MISC 1 Act by Does not apply route daily. 2 each 5   Glucagon (GVOKE HYPOPEN 2-PACK) 1 MG/0.2ML SOAJ Inject 1 Act into the skin daily as needed. 2 mL 5   hydrochlorothiazide (HYDRODIURIL) 12.5 MG tablet TAKE 1 TABLET BY MOUTH EVERY DAY 90 tablet 1   Insulin NPH, Human,, Isophane, (HUMULIN N KWIKPEN) 100 UNIT/ML Kiwkpen 80 units in the AM and 30 units in the PM 45 mL 1   isosorbide mononitrate (IMDUR) 30 MG 24 hr tablet TAKE 1 TABLET BY MOUTH EVERY DAY 90 tablet 1   Lancets (ONETOUCH DELICA PLUS [id number]) MISC TEST 2 TIMES DAILY. DX: E11.51 100 each 0   metoprolol tartrate (LOPRESSOR) 50 MG tablet TAKE 1 TABLET TWICE A DAY 180 tablet 1   nitroGLYCERIN (NITROSTAT) 0.4 MG SL tablet Place 1 tablet (0.4 mg total)  under the tongue every 5 (five) minutes as needed. x3 doses as needed for chest pain 30 tablet 0   omeprazole (PRILOSEC) 40 MG capsule TAKE 1 CAPSULE BY MOUTH EVERY DAY 90 capsule 1   ONETOUCH VERIO test strip USE TO TEST BLOOD SUGAR TWICE A DAY DX E11.51 200 strip 3   Suvorexant (BELSOMRA) 15 MG TABS Take 1 tablet by mouth at bedtime as needed. 90 tablet 1   telmisartan (MICARDIS) 80 MG tablet TAKE 1 TABLET BY MOUTH EVERY DAY 90 tablet 1   No facility-administered medications prior to visit.    ROS Review of Systems  Constitutional:  Negative for chills, diaphoresis, fatigue and fever.  HENT: Negative.    Eyes: Negative.   Respiratory:  Negative for cough, chest tightness, shortness of breath and wheezing.   Cardiovascular:  Negative for chest pain, palpitations and leg swelling.  Gastrointestinal:  Negative for abdominal pain, constipation, diarrhea and nausea.  Endocrine: Negative.   Genitourinary:  Positive for dysuria and hematuria. Negative for difficulty urinating, flank pain, testicular pain and urgency.  Musculoskeletal:  Negative for arthralgias.  Skin: Negative.  Negative for color change and pallor.  Neurological: Negative.  Negative for dizziness, weakness and light-headedness.  Hematological:  Negative for adenopathy. Does not bruise/bleed easily.  Psychiatric/Behavioral: Negative.     Objective:  BP (!) 142/70 (BP Location: Left Arm, Patient Position: Sitting, Cuff Size: Large)   Pulse 75   Temp 98.2 F (36.8 C) (Oral)   Ht 6' (1.829 m)   Wt 195 lb (88.5 kg)   SpO2 97%   BMI 26.45 kg/m   BP Readings from Last 3 Encounters:  06/26/20 (!) 142/70  02/11/20 138/80  09/23/19 (!) 158/72    Wt Readings from Last 3 Encounters:  06/26/20 195 lb (88.5 kg)  02/11/20 193 lb (87.5 kg)  09/23/19 198 lb 4 oz (89.9 kg)    Physical Exam Constitutional:      Appearance: He is not ill-appearing.  HENT:     Mouth/Throat:     Mouth: Mucous membranes are moist.  Eyes:      Conjunctiva/sclera: Conjunctivae normal.  Cardiovascular:     Rate and Rhythm: Normal rate and regular rhythm.     Heart sounds: No murmur heard. Pulmonary:     Effort: Pulmonary effort is normal.     Breath sounds: No stridor. No wheezing, rhonchi or rales.  Abdominal:     General: Abdomen is flat. There is no distension.     Palpations: Abdomen is soft. There is no hepatomegaly, splenomegaly or mass.  Musculoskeletal:     Cervical back: Neck supple.     Right lower leg: No edema.     Left lower leg: No edema.  Lymphadenopathy:     Cervical: No cervical adenopathy.  Skin:    General: Skin is warm and dry.  Neurological:     General: No focal deficit present.     Mental Status: He is alert.    Lab Results  Component Value Date   WBC 8.1 06/26/2020   HGB 12.2 (L) 06/26/2020   HCT 37.5 (L) 06/26/2020   PLT 68.0 Repeated and verified X2. (L) 06/26/2020   GLUCOSE 229 (H) 06/26/2020   CHOL 128 09/23/2019   TRIG 91 09/23/2019   HDL 36 (L) 09/23/2019   LDLDIRECT 80.6 06/26/2010   LDLCALC 75 09/23/2019   ALT 13 03/30/2018   AST 16 03/30/2018   NA 142 06/26/2020   K 5.0 06/26/2020   CL 113 (H) 06/26/2020   CREATININE 2.91 (H) 06/26/2020   BUN 42 (H) 06/26/2020   CO2 22 06/26/2020   TSH 1.45 09/30/2018   PSA 0.03 (L) 06/29/2009   INR 1.1 04/08/2008   HGBA1C 8.0 (H) 06/26/2020   MICROALBUR 2.4 09/23/2019    CT Head Wo Contrast  Result Date: 11/04/2017 CLINICAL DATA:  Acute headache, worst headache of life, right-sided, prior subdural hematoma years ago EXAM: CT HEAD WITHOUT CONTRAST TECHNIQUE: Contiguous axial images were obtained from the base of the skull through the vertex without intravenous contrast. COMPARISON:  CT brain scan of 08/29/2016 FINDINGS: Brain: The ventricular system remains prominent as are the cortical sulci indicative of diffuse atrophy. The septum is midline in position. Mild small vessel ischemic change remains throughout the periventricular white  matter. No hemorrhage, mass lesion, or acute infarction is seen. Vascular: No vascular abnormality is evident on this unenhanced study. Skull: Changes of right parietal craniotomy again are noted. No complicating features are seen. Sinuses/Orbits: The paranasal sinuses appear well pneumatized. Other: None. IMPRESSION: 1. No change in atrophy and mild small vessel ischemic change in the periventricular white matter. 2. Stable right parietal craniotomy. Electronically Signed   By: Ivar Drape M.D.   On: 11/04/2017 10:24   Yellow     yellow  Clarity, UA  very cloudy     cloudy     Glucose, UA Negative Negative     Negative     Bilirubin, UA  n     Negative     Ketones, UA  n     Negative     Spec Grav, UA 1.010 - 1.025 1.020     1.015     Blood, UA  1+     Positive CM     pH, UA 5.0 - 8.0 6.0     6.0     Protein, UA Negative Positive Abnormal      Positive Abnormal  CM     Urobilinogen, UA 0.2 or 1.0 E.U./dL 0.2  0.2 R   0.2 R  0.2  0.2 R  0.2 R   Nitrite, UA  positive     Negative     Leukocytes, UA Negative Large (3+) Abnormal      Large (3+) Abnormal  CM   NEGATIVE   Appearance   CLEAR R        Component 4 d ago   Source: NOT GIVEN   Status: FINAL   Isolate 1: Pseudomonas aeruginosa Abnormal    Comment: Greater than 100,000 CFU/mL of Pseudomonas aeruginosa  Resulting Agency [facility] [place]      Susceptibility   Pseudomonas aeruginosa    CULT, URN, SPECIAL NEGATIVE 1    CEFEPIME 4  Sensitive    CEFTAZIDIME 8  Sensitive    CIPROFLOXACIN <=0.25  Sensitive    GENTAMICIN <=1  Sensitive    LEVOFLOXACIN 0.25  Sensitive    PIP/TAZO 8  Sensitive    TOBRAMYCIN <=1  Sensitive 1           Assessment & Plan:   Robert Clayton was seen today for urinary tract infection, anemia and diabetes.  Diagnoses and all orders for this visit:  ANEMIA, B12 DEFICIENCY- Will continue parenteral B12 replacement therapy. -     CBC with Differential/Platelet; Future -     CBC with  Differential/Platelet  CRI (chronic renal insufficiency), stage 3 (moderate) (HCC)- His renal function is stable. -     Basic metabolic panel; Future -     Basic metabolic panel  Essential hypertension -     Basic metabolic panel; Future -     Basic metabolic panel  Cloudy urine- His urine culture is positive for Pseudomonas.  I initially prescribed Bactrim which I have asked him to discontinue.  Will treat with a fluoroquinolone. -     POCT Urinalysis Dipstick -     CULTURE, URINE COMPREHENSIVE; Future -     CULTURE, URINE COMPREHENSIVE  Acute cystitis with hematuria -     CULTURE, URINE COMPREHENSIVE; Future -     Discontinue: sulfamethoxazole-trimethoprim (BACTRIM DS) 800-160 MG tablet; Take 1 tablet by mouth 2 (two) times daily for 7 days. -     CULTURE, URINE COMPREHENSIVE -     levofloxacin (LEVAQUIN) 250 MG tablet; Take 1 tablet (250 mg total) by mouth daily for 10 days.  DM (diabetes mellitus), type 2, uncontrolled, periph vascular complic ([place])- His BS is adequately well controlled. -     Hemoglobin A1c; Future -     Hemoglobin A1c  Cystitis due to Pseudomonas -     levofloxacin (LEVAQUIN) 250 MG tablet; Take 1 tablet (250 mg total) by mouth daily for 10 days.  I have discontinued Sakib Schexnider's sulfamethoxazole-trimethoprim. I am also having him start on levofloxacin. Additionally,  I am having him maintain his nitroGLYCERIN, acetaminophen, aspirin EC, telmisartan, Belsomra, B-D INS SYR ULTRAFINE 1CC/30G, Gvoke HypoPen 2-Pack, FreeStyle Libre 2 Sensor, [employer] 2 Reader, allopurinol, omeprazole, hydrochlorothiazide, isosorbide mononitrate, OneTouch Delica Plus [id number], OneTouch Verio, HumuLIN N KwikPen, metoprolol tartrate, and atorvastatin.  Meds ordered this encounter  Medications   DISCONTD: sulfamethoxazole-trimethoprim (BACTRIM DS) 800-160 MG tablet    Sig: Take 1 tablet by mouth 2 (two) times daily for 7 days.    Dispense:  14 tablet    Refill:  0    levofloxacin (LEVAQUIN) 250 MG tablet    Sig: Take 1 tablet (250 mg total) by mouth daily for 10 days.    Dispense:  10 tablet    Refill:  0      Follow-up: Return in about 3 weeks (around 07/17/2020).  [name], MD

## 2020-06-26 NOTE — Patient Instructions (Signed)
Urinary Tract Infection, Adult A urinary tract infection (UTI) is an infection of any part of the urinary tract. The urinary tract includes the kidneys, ureters, bladder, and urethra. These organs make, store, and get rid of urine in the body. An upper UTI affects the ureters and kidneys. A lower UTI affects the bladder and urethra. What are the causes? Most urinary tract infections are caused by bacteria in your genital area around your urethra, where urine leaves your body. These bacteria grow and cause inflammation of your urinary tract. What increases the risk? You are more likely to develop this condition if: You have a urinary catheter that stays in place. You are not able to control when you urinate or have a bowel movement (incontinence). You are male and you: Use a spermicide or diaphragm for birth control. Have low estrogen levels. Are pregnant. You have certain genes that increase your risk. You are sexually active. You take antibiotic medicines. You have a condition that causes your flow of urine to slow down, such as: An enlarged prostate, if you are male. Blockage in your urethra. A kidney stone. A nerve condition that affects your bladder control (neurogenic bladder). Not getting enough to drink, or not urinating often. You have certain medical conditions, such as: Diabetes. A weak disease-fighting system (immunesystem). Sickle cell disease. Gout. Spinal cord injury. What are the signs or symptoms? Symptoms of this condition include: Needing to urinate right away (urgency). Frequent urination. This may include small amounts of urine each time you urinate. Pain or burning with urination. Blood in the urine. Urine that smells bad or unusual. Trouble urinating. Cloudy urine. Vaginal discharge, if you are male. Pain in the abdomen or the lower back. You may also have: Vomiting or a decreased appetite. Confusion. Irritability or tiredness. A fever or  chills. Diarrhea. The first symptom in older adults may be confusion. In some cases, they may not have any symptoms until the infection has worsened. How is this diagnosed? This condition is diagnosed based on your medical history and a physical exam. You may also have other tests, including: Urine tests. Blood tests. Tests for STIs (sexually transmitted infections). If you have had more than one UTI, a cystoscopy or imaging studies may be done to determine the cause of the infections. How is this treated? Treatment for this condition includes: Antibiotic medicine. Over-the-counter medicines to treat discomfort. Drinking enough water to stay hydrated. If you have frequent infections or have other conditions such as a kidney stone, you may need to see a health care provider who specializes in the urinary tract (urologist). In rare cases, urinary tract infections can cause sepsis. Sepsis is a life-threatening condition that occurs when the body responds to an infection. Sepsis is treated in the hospital with IV antibiotics, fluids, and other medicines. Follow these instructions at home: Medicines Take over-the-counter and prescription medicines only as told by your health care provider. If you were prescribed an antibiotic medicine, take it as told by your health care provider. Do not stop using the antibiotic even if you start to feel better. General instructions Make sure you: Empty your bladder often and completely. Do not hold urine for long periods of time. Empty your bladder after sex. Wipe from front to back after urinating or having a bowel movement if you are male. Use each tissue only one time when you wipe. Drink enough fluid to keep your urine pale yellow. Keep all follow-up visits. This is important. Contact a health care provider   if: Your symptoms do not get better after 1-2 days. Your symptoms go away and then return. Get help right away if: You have severe pain in your  back or your lower abdomen. You have a fever or chills. You have nausea or vomiting. Summary A urinary tract infection (UTI) is an infection of any part of the urinary tract, which includes the kidneys, ureters, bladder, and urethra. Most urinary tract infections are caused by bacteria in your genital area. Treatment for this condition often includes antibiotic medicines. If you were prescribed an antibiotic medicine, take it as told by your health care provider. Do not stop using the antibiotic even if you start to feel better. Keep all follow-up visits. This is important. This information is not intended to replace advice given to you by your health care provider. Make sure you discuss any questions you have with your health care provider. Document Revised: 08/06/2019 Document Reviewed: 08/06/2019 Elsevier Patient Education  2022 Elsevier Inc.  

## 2020-06-26 NOTE — Progress Notes (Signed)
Patient in office today to pick up diabetic shoes. Patient tried on the shoes and voiced being satisfied with the fit. Break-in process reviewed with the patient at this time and copy of instructions were given to the patient. Patient verbalized understanding. Advised patient to call the office with any questions, comments, or concerns.

## 2020-06-30 DIAGNOSIS — B965 Pseudomonas (aeruginosa) (mallei) (pseudomallei) as the cause of diseases classified elsewhere: Secondary | ICD-10-CM | POA: Insufficient documentation

## 2020-06-30 LAB — CULTURE, URINE COMPREHENSIVE

## 2020-06-30 MED ORDER — LEVOFLOXACIN 250 MG PO TABS: 250.0000 mg | ORAL_TABLET | Freq: Every day | ORAL | 0 refills | Status: AC

## 2020-07-05 ENCOUNTER — Encounter: Payer: Self-pay | Admitting: Podiatry

## 2020-07-05 ENCOUNTER — Other Ambulatory Visit: Payer: Self-pay

## 2020-07-05 ENCOUNTER — Ambulatory Visit (INDEPENDENT_AMBULATORY_CARE_PROVIDER_SITE_OTHER): Payer: Medicare Other | Admitting: Podiatry

## 2020-07-05 DIAGNOSIS — N2889 Other specified disorders of kidney and ureter: Secondary | ICD-10-CM

## 2020-07-05 DIAGNOSIS — I872 Venous insufficiency (chronic) (peripheral): Secondary | ICD-10-CM | POA: Diagnosis not present

## 2020-07-05 DIAGNOSIS — M79674 Pain in right toe(s): Secondary | ICD-10-CM

## 2020-07-05 DIAGNOSIS — E1165 Type 2 diabetes mellitus with hyperglycemia: Secondary | ICD-10-CM | POA: Diagnosis not present

## 2020-07-05 DIAGNOSIS — B351 Tinea unguium: Secondary | ICD-10-CM

## 2020-07-05 DIAGNOSIS — N183 Chronic kidney disease, stage 3 unspecified: Secondary | ICD-10-CM

## 2020-07-05 DIAGNOSIS — M79675 Pain in left toe(s): Secondary | ICD-10-CM

## 2020-07-05 DIAGNOSIS — IMO0002 Reserved for concepts with insufficient information to code with codable children: Secondary | ICD-10-CM

## 2020-07-05 DIAGNOSIS — E1151 Type 2 diabetes mellitus with diabetic peripheral angiopathy without gangrene: Secondary | ICD-10-CM

## 2020-07-05 NOTE — Progress Notes (Signed)
This patient returns to my office for at risk foot care.  This patient requires this care by a professional since this patient will be at risk due to having diabetic mellitus with pvd and diabetic neuropathy and venous insufficiency and CKD.  This patient is unable to cut nails himself since the patient cannot reach his nails.These nails are painful walking and wearing shoes.  This patient presents for at risk foot care today.  General Appearance  Alert, conversant and in no acute stress.  Vascular  Dorsalis pedis and posterior tibial  pulses are weakly  palpable  bilaterally.  Capillary return is within normal limits  bilaterally. Temperature is within normal limits  bilaterally.  Neurologic  Senn-Weinstein monofilament wire test diminished bilaterally. Muscle power within normal limits bilaterally.  Nails Thick disfigured discolored nails with subungual debris  from hallux to fifth toes bilaterally. No evidence of bacterial infection or drainage bilaterally.  Orthopedic  No limitations of motion  feet .  No crepitus or effusions noted. HAV  B/L.  Plantar flexed fifth metatarsal right foot.  Skin  normotropic skin with  noted bilaterally.  No signs of infections or ulcers noted.   Porokeratosis sub 5th met right foot.  Onychomycosis  Pain in right toes  Pain in left toes.  Porokeratosis right foot.  Consent was obtained for treatment procedures.   Mechanical debridement of nails 1-5  bilaterally performed with a nail nipper.  Filed with dremel without incident. Debrideing nails right foot reveal laceration at distal aspect second toenail right foot.  Neosporin/DSD.  Patient is pleased with his new diabetic shoes.   Return office visit    3 mos.                 Told patient to return for periodic foot care and evaluation due to potential at risk complications.   Gardiner Barefoot DPM

## 2020-07-06 ENCOUNTER — Telehealth: Payer: Self-pay | Admitting: Internal Medicine

## 2020-07-06 NOTE — Chronic Care Management (AMB) (Signed)
  Chronic Care Management   Note  07/06/2020 Name: Robert Clayton MRN: [id number] DOB: 03/09/1930  Robert Clayton is a 87 y.o. year old male who is a primary care patient of Janith Lima, MD. I reached out to Inetta Fermo by phone today in response to a referral sent by [name] PCP, Janith Lima, MD.   Mr. Geister was given information about Chronic Care Management services today including:  CCM service includes personalized support from designated clinical staff supervised by his physician, including individualized plan of care and coordination with other care providers 24/7 contact phone numbers for assistance for urgent and routine care needs. Service will only be billed when office clinical staff spend 20 minutes or more in a month to coordinate care. Only one practitioner may furnish and bill the service in a calendar month. The patient may stop CCM services at any time (effective at the end of the month) by phone call to the office staff.   Patient agreed to services and verbal consent obtained.   Follow up plan:   Lauretta Grill [employer] Scheduler

## 2020-07-18 ENCOUNTER — Ambulatory Visit: Payer: Medicare Other | Admitting: Internal Medicine

## 2020-07-20 ENCOUNTER — Ambulatory Visit (INDEPENDENT_AMBULATORY_CARE_PROVIDER_SITE_OTHER): Payer: Medicare Other | Admitting: Internal Medicine

## 2020-07-20 ENCOUNTER — Encounter: Payer: Self-pay | Admitting: Internal Medicine

## 2020-07-20 ENCOUNTER — Ambulatory Visit: Payer: Medicare Other

## 2020-07-20 ENCOUNTER — Other Ambulatory Visit: Payer: Self-pay

## 2020-07-20 VITALS — BP 138/70 | HR 77 | Temp 98.6°F | Ht 72.0 in | Wt 196.0 lb

## 2020-07-20 DIAGNOSIS — E119 Type 2 diabetes mellitus without complications: Secondary | ICD-10-CM | POA: Diagnosis not present

## 2020-07-20 DIAGNOSIS — E1165 Type 2 diabetes mellitus with hyperglycemia: Secondary | ICD-10-CM

## 2020-07-20 DIAGNOSIS — E538 Deficiency of other specified B group vitamins: Secondary | ICD-10-CM

## 2020-07-20 DIAGNOSIS — IMO0002 Reserved for concepts with insufficient information to code with codable children: Secondary | ICD-10-CM

## 2020-07-20 DIAGNOSIS — Z794 Long term (current) use of insulin: Secondary | ICD-10-CM | POA: Diagnosis not present

## 2020-07-20 DIAGNOSIS — E1151 Type 2 diabetes mellitus with diabetic peripheral angiopathy without gangrene: Secondary | ICD-10-CM

## 2020-07-20 MED ORDER — CYANOCOBALAMIN 1000 MCG/ML IJ SOLN
1000.0000 ug | Freq: Once | INTRAMUSCULAR | Status: AC
  Administered 2020-07-20: 1000 ug via INTRAMUSCULAR

## 2020-07-20 MED ORDER — HUMULIN N KWIKPEN 100 UNIT/ML ~~LOC~~ SUPN: PEN_INJECTOR | SUBCUTANEOUS | 1 refills | Status: DC

## 2020-07-20 MED ORDER — NOVOLIN N FLEXPEN 100 UNIT/ML ~~LOC~~ SUPN: 5.0000 [IU] | PEN_INJECTOR | Freq: Three times a day (TID) | SUBCUTANEOUS | 2 refills | Status: AC

## 2020-07-20 NOTE — Patient Instructions (Signed)
Type 1 Diabetes Mellitus, Diagnosis, Adult  Type 1 diabetes (type 1 diabetes mellitus) is a long-term, or chronic, disease. It occurs when the cells in the pancreas (beta cells) that make a hormone called insulin are destroyed. Normally, insulin allows blood sugar, also called glucose, to enter cells in the body. The cells use glucose for energy. Lack of insulin causes excess glucose to build up in the blood instead of going into cells. As a result, high blood glucose, or hyperglycemia, develops. There is currently no cure for type 1 diabetes, but it can be managed with insulin therapy and lifestyle changes. What are the causes? The exact cause of type 1 diabetes is not known. What increases the risk? You may be more likely to develop this condition if you have a family member who has type 1 diabetes. Other factors may also make you more likely to develop type 1 diabetes, such as: Having a gene for type 1 diabetes that is passed from parent to child (inherited). Having autoimmune disorders. These are conditions in which the body's disease-fighting system (immunesystem) attacks itself. Being exposed to certain viruses. Living in an area with cold weather conditions. What are the signs or symptoms? Symptoms may develop slowly over days or weeks, or they may develop suddenly. Symptoms may include: Increased thirst or hunger. Increased urination or increased urination during the night. Sudden or unexplained weight changes. Tiredness (fatigue) or weakness. Vision changes, such as blurry vision. How is this diagnosed? This condition is diagnosed based on your symptoms, your medical history, a physical exam, and your blood glucose level. Your blood glucose may be checked with one or more of the following blood tests: A fasting blood glucose (FBG) test. You will not be allowed to eat (you will fast) for 8 hours or longer before a blood sample is taken. A random blood glucose test. This checks blood  glucose at any time of day no matter when you ate. An A1C (hemoglobin A1C) blood test. This gives information about blood glucose control over the last 2-3 months. You may be diagnosed with type 1 diabetes if: Your FBG level is 126 mg/dL (7 mmol/L) or higher. Your random blood glucose level is 200 mg/dL (11.1 mmol/L) or higher. Your hemoglobin A1C level is 6.5% or higher. These blood tests may be repeated to confirm your diagnosis. How is this treated? This condition may be managed by a specialist called an endocrinologist. You can help manage your type 1 diabetes by following instructions from your health care provider about: Taking insulin daily. This helps to keep your blood glucose levels in the healthy range. Taking medicines to help prevent complications from diabetes. These may include: Aspirin. Medicine to lower cholesterol. Medicine to control blood pressure. Checking your blood glucose as often as told. Making diet and lifestyle changes. These may include: Following an individualized nutrition plan that is developed by a registered dietitian. Getting regular exercise. Finding ways to manage stress. Your health care provider will set individualized treatment goals for you. Your goals will be based on your age, other medical conditions you have, and how you respond to diabetes treatment. In general, the goal of treatment is to maintain the following blood glucose levels: Before meals (preprandial): 80-130 mg/dL (4.4-7.2 mmol/L). After meals (postprandial): below 180 mg/dL (10 mmol/L). Hemoglobin A1C level: less than 7%. Follow these instructions at home: Questions to ask your health care provider Consider asking the following questions: Do I need to meet with a certified diabetes care and education   specialist? Should I consider joining a support group for people with diabetes? What equipment will I need to manage my diabetes at home? What diabetes medicines should I take, and  when? How often should I check my blood glucose? What number should I call if I have questions? When is my next appointment? General instructions Take over-the-counter and prescription medicines only as told by your health care provider. Keep all follow-up visits as told by your health care provider. This is important. Where to find more information American Diabetes Association (ADA): diabetes.org Association of Diabetes Care and Education Specialists (ADCES): diabeteseducator.org International Diabetes Federation (IDF): idf.org Contact a health care provider if: Your blood glucose level is higher than 240 mg/dL (13.3 mmol/L) for 2 days in a row. You have been sick or have had a fever for 2 days or more and you are not getting better. You have any of the following problems for more than 6 hours: You cannot eat or drink. You have nausea and vomiting. You have diarrhea. Get help right away if: Your blood glucose is below 54 mg/dL (3 mmol/L). You become confused or you have trouble thinking clearly. You have trouble breathing. These symptoms may represent a serious problem that is an emergency. Do not wait to see if the symptoms will go away. Get medical help right away. Call your local emergency services (911 in the [place] Do not drive yourself to the hospital. Summary Type 1 diabetes (type 1 diabetes mellitus) is a long-term, or chronic, disease. It occurs when the cells in the pancreas (beta cells) that make a hormone called insulin are destroyed. There is currently no cure for type 1 diabetes. The exact cause of type 1 diabetes is not known. This condition is treated by taking insulin and other medicines to help prevent complications from diabetes. Diet and lifestyle changes are also part of treatment. Your health care provider will set individualized treatment goals for you. Your goals will be based on your age, other medical conditions you have, and how you respond to diabetes  treatment. This information is not intended to replace advice given to you by your health care provider. Make sure you discuss any questions you have with your health care provider. Document Revised: 02/11/2019 Document Reviewed: 02/11/2019 Elsevier Patient Education  2022 Elsevier Inc.  

## 2020-07-21 NOTE — Progress Notes (Signed)
Subjective:  Patient ID: Robert Clayton, male    DOB: February 08, 1930  Age: 87 y.o. MRN: [id number]  CC: Diabetes  This visit occurred during the SARS-CoV-2 public health emergency.  Safety protocols were in place, including screening questions prior to the visit, additional usage of staff PPE, and extensive cleaning of exam room while observing appropriate contact time as indicated for disinfecting solutions.    HPI Robert Clayton presents for f/up -  He tells me his blood sugars are well controlled.  He likes novolin better then humulin. He wants me to prescribe novolin for blood sugar controled.  He denies polys.  Outpatient Medications Prior to Visit  Medication Sig Dispense Refill   acetaminophen (TYLENOL) 325 MG tablet Take 650 mg by mouth every 6 (six) hours as needed.     allopurinol (ZYLOPRIM) 300 MG tablet TAKE 1 TABLET BY MOUTH EVERY DAY 90 tablet 1   aspirin EC 81 MG tablet Take 81 mg by mouth daily.     atorvastatin (LIPITOR) 10 MG tablet TAKE 1 TABLET BY MOUTH EVERY DAY 90 tablet 1   B-D INS SYR ULTRAFINE 1CC/30G 30G X 1/2" 1 ML MISC USE AS DIRECTED TWICE A DAY 200 each 3   Continuous Blood Gluc Receiver (FREESTYLE LIBRE 2 READER) DEVI 1 Act by Does not apply route daily. 2 each 5   Continuous Blood Gluc Sensor (FREESTYLE LIBRE 2 SENSOR) MISC 1 Act by Does not apply route daily. 2 each 5   Glucagon (GVOKE HYPOPEN 2-PACK) 1 MG/0.2ML SOAJ Inject 1 Act into the skin daily as needed. 2 mL 5   hydrochlorothiazide (HYDRODIURIL) 12.5 MG tablet TAKE 1 TABLET BY MOUTH EVERY DAY 90 tablet 1   isosorbide mononitrate (IMDUR) 30 MG 24 hr tablet TAKE 1 TABLET BY MOUTH EVERY DAY 90 tablet 1   Lancets (ONETOUCH DELICA PLUS MWNUUV25D) MISC TEST 2 TIMES DAILY. DX: E11.51 100 each 0   metoprolol tartrate (LOPRESSOR) 50 MG tablet TAKE 1 TABLET TWICE A DAY 180 tablet 1   nitroGLYCERIN (NITROSTAT) 0.4 MG SL tablet Place 1 tablet (0.4 mg total) under the tongue every 5 (five) minutes as needed. x3 doses  as needed for chest pain 30 tablet 0   omeprazole (PRILOSEC) 40 MG capsule TAKE 1 CAPSULE BY MOUTH EVERY DAY 90 capsule 1   ONETOUCH VERIO test strip USE TO TEST BLOOD SUGAR TWICE A DAY DX E11.51 200 strip 3   Suvorexant (BELSOMRA) 15 MG TABS Take 1 tablet by mouth at bedtime as needed. 90 tablet 1   telmisartan (MICARDIS) 80 MG tablet TAKE 1 TABLET BY MOUTH EVERY DAY 90 tablet 1   Insulin NPH, Human,, Isophane, (HUMULIN N KWIKPEN) 100 UNIT/ML Kiwkpen 80 units in the AM and 30 units in the PM 45 mL 1   No facility-administered medications prior to visit.    ROS Review of Systems  Constitutional:  Negative for diaphoresis and fatigue.  HENT: Negative.    Eyes:  Negative for visual disturbance.  Respiratory:  Negative for cough, chest tightness and shortness of breath.   Cardiovascular:  Negative for chest pain, palpitations and leg swelling.  Gastrointestinal:  Negative for abdominal pain, diarrhea, nausea and vomiting.  Endocrine: Negative.  Negative for polydipsia, polyphagia and polyuria.  Genitourinary: Negative.  Negative for difficulty urinating.  Musculoskeletal: Negative.   Skin: Negative.   Neurological: Negative.  Negative for dizziness, weakness and light-headedness.  Hematological:  Negative for adenopathy. Does not bruise/bleed easily.  Psychiatric/Behavioral: Negative.  Objective:  BP 138/70 (BP Location: Left Arm, Patient Position: Sitting, Cuff Size: Large)   Pulse 77   Temp 98.6 F (37 C) (Oral)   Ht 6' (1.829 m)   Wt 196 lb (88.9 kg)   SpO2 95%   BMI 26.58 kg/m   BP Readings from Last 3 Encounters:  07/20/20 138/70  06/26/20 (!) 142/70  02/11/20 138/80    Wt Readings from Last 3 Encounters:  07/20/20 196 lb (88.9 kg)  06/26/20 195 lb (88.5 kg)  02/11/20 193 lb (87.5 kg)    Physical Exam Vitals reviewed.  HENT:     Nose: Nose normal.     Mouth/Throat:     Mouth: Mucous membranes are moist.  Eyes:     Conjunctiva/sclera: Conjunctivae normal.   Cardiovascular:     Rate and Rhythm: Normal rate and regular rhythm.     Heart sounds: No murmur heard. Pulmonary:     Effort: Pulmonary effort is normal.     Breath sounds: No stridor. No wheezing, rhonchi or rales.  Abdominal:     General: Abdomen is flat. Bowel sounds are normal. There is no distension.     Palpations: Abdomen is soft. There is no hepatomegaly, splenomegaly or mass.  Musculoskeletal:        General: Normal range of motion.     Cervical back: Neck supple.     Right lower leg: No edema.     Left lower leg: No edema.  Lymphadenopathy:     Cervical: No cervical adenopathy.  Skin:    General: Skin is warm and dry.  Neurological:     General: No focal deficit present.     Mental Status: He is alert.    Lab Results  Component Value Date   WBC 8.1 06/26/2020   HGB 12.2 (L) 06/26/2020   HCT 37.5 (L) 06/26/2020   PLT 68.0 Repeated and verified X2. (L) 06/26/2020   GLUCOSE 229 (H) 06/26/2020   CHOL 128 09/23/2019   TRIG 91 09/23/2019   HDL 36 (L) 09/23/2019   LDLDIRECT 80.6 06/26/2010   LDLCALC 75 09/23/2019   ALT 13 03/30/2018   AST 16 03/30/2018   NA 142 06/26/2020   K 5.0 06/26/2020   CL 113 (H) 06/26/2020   CREATININE 2.91 (H) 06/26/2020   BUN 42 (H) 06/26/2020   CO2 22 06/26/2020   TSH 1.45 09/30/2018   PSA 0.03 (L) 06/29/2009   INR 1.1 04/08/2008   HGBA1C 8.0 (H) 06/26/2020   MICROALBUR 2.4 09/23/2019    CT Head Wo Contrast  Result Date: 11/04/2017 CLINICAL DATA:  Acute headache, worst headache of life, right-sided, prior subdural hematoma years ago EXAM: CT HEAD WITHOUT CONTRAST TECHNIQUE: Contiguous axial images were obtained from the base of the skull through the vertex without intravenous contrast. COMPARISON:  CT brain scan of 08/29/2016 FINDINGS: Brain: The ventricular system remains prominent as are the cortical sulci indicative of diffuse atrophy. The septum is midline in position. Mild small vessel ischemic change remains throughout the  periventricular white matter. No hemorrhage, mass lesion, or acute infarction is seen. Vascular: No vascular abnormality is evident on this unenhanced study. Skull: Changes of right parietal craniotomy again are noted. No complicating features are seen. Sinuses/Orbits: The paranasal sinuses appear well pneumatized. Other: None. IMPRESSION: 1. No change in atrophy and mild small vessel ischemic change in the periventricular white matter. 2. Stable right parietal craniotomy. Electronically Signed   By: Ivar Drape M.D.   On: 11/04/2017 10:24  Assessment & Plan:   Elster was seen today for diabetes.  Diagnoses and all orders for this visit:  B12 deficiency -     cyanocobalamin ((VITAMIN B-12)) injection 1,000 mcg  DM (diabetes mellitus), type 2, uncontrolled, periph vascular complic (HCC) -     Insulin NPH, Human,, Isophane, (HUMULIN N KWIKPEN) 100 UNIT/ML Kiwkpen; 80 units in the AM and 30 units in the PM -     Insulin NPH, Human,, Isophane, (NOVOLIN N FLEXPEN) 100 UNIT/ML Kiwkpen; Inject 5 Units into the skin 3 (three) times daily before meals.  Insulin-requiring or dependent type II diabetes mellitus ([place])- His A1c was recently 8.0%.  Will continue NPH insulin for blood sugar control     -     Insulin NPH, Human,, Isophane, (HUMULIN N KWIKPEN) 100 UNIT/ML Kiwkpen; 80 units in the AM and 30 units in the PM -     Insulin NPH, Human,, Isophane, (NOVOLIN N FLEXPEN) 100 UNIT/ML Kiwkpen; Inject 5 Units into the skin 3 (three) times daily before meals.  I have discontinued Gaje Venneman's HumuLIN N KwikPen and HumuLIN N KwikPen. I am also having him start on NovoLIN N FlexPen. Additionally, I am having him maintain his nitroGLYCERIN, acetaminophen, aspirin EC, telmisartan, Belsomra, B-D INS SYR ULTRAFINE 1CC/30G, Gvoke HypoPen 2-Pack, FreeStyle Libre 2 Sensor, [employer] 2 Reader, allopurinol, omeprazole, hydrochlorothiazide, isosorbide mononitrate, OneTouch Delica Plus [id number], OneTouch Verio,  metoprolol tartrate, and atorvastatin. We administered cyanocobalamin.  Meds ordered this encounter  Medications   DISCONTD: Insulin NPH, Human,, Isophane, (HUMULIN N KWIKPEN) 100 UNIT/ML Kiwkpen    Sig: 80 units in the AM and 30 units in the PM    Dispense:  45 mL    Refill:  1   Insulin NPH, Human,, Isophane, (NOVOLIN N FLEXPEN) 100 UNIT/ML Kiwkpen    Sig: Inject 5 Units into the skin 3 (three) times daily before meals.    Dispense:  12 mL    Refill:  2   cyanocobalamin ((VITAMIN B-12)) injection 1,000 mcg     Follow-up: Return in about 3 months (around 10/20/2020).  [name], MD

## 2020-07-27 ENCOUNTER — Telehealth: Payer: Self-pay | Admitting: Internal Medicine

## 2020-07-27 NOTE — Telephone Encounter (Signed)
Housecalls update: Robert Clayton stated they performed a QuantaFlo test   Left showing 0.20 Right showing 0.23  Robert [phone]

## 2020-07-31 NOTE — Telephone Encounter (Signed)
Called pt, LVM.   

## 2020-08-04 ENCOUNTER — Telehealth: Payer: Self-pay

## 2020-08-04 NOTE — Chronic Care Management (AMB) (Signed)
Chronic Care Management Pharmacy Assistant   Name: Robert Clayton  MRN: [id number] DOB: 30-Aug-1930  Robert Clayton is an 87 y.o. year old male who presents for his initial CCM visit with the clinical pharmacist.   Recent office visits:  07/20/20-Tomas Evalina Field, MD (PCP) Diabetic follow up. Vitamin B-12 injection given. Discontinue HumuLIN N KwikPen and start on NOVOLIN N FLEXPEN) 100 UNIT/ML Kiwkpen; Inject 5 Units into the skin 3 (three) times daily before meals. Follow up in 3 months. Start  levofloxacin (LEVAQUIN) 250 MG tablet; Take 1 tablet (250 mg total) by mouth daily for 10 days. Follow up in 3 weeks. 06/26/20-Tomas Evalina Field, MD (PCP) Seen for UTI. Labs ordered.  Discontinue:sulfamethoxazole-trimethoprim (BACTRIM DS) 800-160 MG tablet. 02/11/20-Thomas Evalina Field, MD (PCP) General follow up. Amb Referral to Nutrition and Diabetic Education. Ambulatory referral to Endocrinology. Labs ordered. Start on Glucagon (GVOKE HYPOPEN 2-PACK) 1 MG/0.2ML SOAJ; Inject 1 Act into the skin daily as needed. Discontinue hydrochlorothiazide. Follow up in 6 months.  Recent consult visits:  07/05/20-Gregory Prudence Davidson, DPM (Podiatry) Seen for nail problem. Follow up in 3 months. 04/24/20-Gregory Prudence Davidson, DPM (Podiatry) Seen for custom diabetic shoes inserts.  03/29/20-Gregory Prudence Davidson, DPM (Podiatry) Seen for nail problem. Follow up in 3 months. Hospital visits:  None in previous 6 months  Medications: Outpatient Encounter Medications as of 08/04/2020  Medication Sig   acetaminophen (TYLENOL) 325 MG tablet Take 650 mg by mouth every 6 (six) hours as needed.   allopurinol (ZYLOPRIM) 300 MG tablet TAKE 1 TABLET BY MOUTH EVERY DAY   aspirin EC 81 MG tablet Take 81 mg by mouth daily.   atorvastatin (LIPITOR) 10 MG tablet TAKE 1 TABLET BY MOUTH EVERY DAY   B-D INS SYR ULTRAFINE 1CC/30G 30G X 1/2" 1 ML MISC USE AS DIRECTED TWICE A DAY   Continuous Blood Gluc Receiver (FREESTYLE LIBRE 2 READER) DEVI 1 Act by Does not apply  route daily.   Continuous Blood Gluc Sensor (FREESTYLE LIBRE 2 SENSOR) MISC 1 Act by Does not apply route daily.   Glucagon (GVOKE HYPOPEN 2-PACK) 1 MG/0.2ML SOAJ Inject 1 Act into the skin daily as needed.   hydrochlorothiazide (HYDRODIURIL) 12.5 MG tablet TAKE 1 TABLET BY MOUTH EVERY DAY   Insulin NPH, Human,, Isophane, (NOVOLIN N FLEXPEN) 100 UNIT/ML Kiwkpen Inject 5 Units into the skin 3 (three) times daily before meals.   isosorbide mononitrate (IMDUR) 30 MG 24 hr tablet TAKE 1 TABLET BY MOUTH EVERY DAY   Lancets (ONETOUCH DELICA PLUS [id number]) MISC TEST 2 TIMES DAILY. DX: E11.51   metoprolol tartrate (LOPRESSOR) 50 MG tablet TAKE 1 TABLET TWICE A DAY   nitroGLYCERIN (NITROSTAT) 0.4 MG SL tablet Place 1 tablet (0.4 mg total) under the tongue every 5 (five) minutes as needed. x3 doses as needed for chest pain   omeprazole (PRILOSEC) 40 MG capsule TAKE 1 CAPSULE BY MOUTH EVERY DAY   ONETOUCH VERIO test strip USE TO TEST BLOOD SUGAR TWICE A DAY DX E11.51   Suvorexant (BELSOMRA) 15 MG TABS Take 1 tablet by mouth at bedtime as needed.   telmisartan (MICARDIS) 80 MG tablet TAKE 1 TABLET BY MOUTH EVERY DAY   No facility-administered encounter medications on file as of 08/04/2020.   Acetaminophen (TYLENOL) 325 MG tablet Last filled:None noted Allopurinol (ZYLOPRIM) 300 MG tablet Last filled:06/23/20 90 DS Aspirin EC 81 MG tablet Last filled:None noted Atorvastatin (LIPITOR) 10 MG tablet Last filled:06/23/20 90 DS Glucagon (GVOKE HYPOPEN 2-PACK) 1 MG/0.2ML SOAJ Last filled:02/11/20 1 DS  Hydrochlorothiazide (HYDRODIURIL) 12.5 MG tablet Last filled:05/10/20 90 DS Insulin NPH, Human,, Isophane, (NOVOLIN N FLEXPEN) 100 UNIT/ML Kiwkpen Last filled:07/20/20 90 DS Isosorbide mononitrate (IMDUR) 30 MG 24 hr tablet Last filled:05/23/20 90 DS Metoprolol tartrate (LOPRESSOR) 50 MG tablet Last filled:06/23/20 90 DS NitroGLYCERIN (NITROSTAT) 0.4 MG SL tablet Last filled:None noted Omeprazole (PRILOSEC) 40  MG capsule Last filled:06/23/20 90 DS Suvorexant (BELSOMRA) 15 MG TABS Last filled:09/24/19 90 DS Telmisartan (MICARDIS) 80 MG tablet Last filled:05/15/20 90 DS   Star Rating Drugs: Telmisartan (MICARDIS) 80 MG tablet Last filled:05/15/20 90 DS Atorvastatin (LIPITOR) 10 MG tablet Last filled:06/23/20 90 DS  Myriam Elta [place], [place]

## 2020-08-10 ENCOUNTER — Encounter: Payer: Self-pay | Admitting: Internal Medicine

## 2020-08-10 ENCOUNTER — Other Ambulatory Visit: Payer: Self-pay

## 2020-08-10 ENCOUNTER — Ambulatory Visit (INDEPENDENT_AMBULATORY_CARE_PROVIDER_SITE_OTHER): Payer: Medicare Other | Admitting: Internal Medicine

## 2020-08-10 VITALS — BP 100/86 | HR 73 | Temp 98.2°F | Ht 72.0 in | Wt 189.0 lb

## 2020-08-10 DIAGNOSIS — E785 Hyperlipidemia, unspecified: Secondary | ICD-10-CM | POA: Diagnosis not present

## 2020-08-10 DIAGNOSIS — N183 Chronic kidney disease, stage 3 unspecified: Secondary | ICD-10-CM | POA: Diagnosis not present

## 2020-08-10 DIAGNOSIS — D539 Nutritional anemia, unspecified: Secondary | ICD-10-CM | POA: Diagnosis not present

## 2020-08-10 DIAGNOSIS — I959 Hypotension, unspecified: Secondary | ICD-10-CM | POA: Diagnosis not present

## 2020-08-10 LAB — BASIC METABOLIC PANEL
BUN: 41 mg/dL — ABNORMAL HIGH (ref 6–23)
CO2: 24 mEq/L (ref 19–32)
Calcium: 8.8 mg/dL (ref 8.4–10.5)
Chloride: 111 mEq/L (ref 96–112)
Creatinine, Ser: 3.54 mg/dL — ABNORMAL HIGH (ref 0.40–1.50)
GFR: 14.83 mL/min — CL (ref >60.00)
Glucose, Bld: 72 mg/dL (ref 70–99)
Potassium: 4.7 mEq/L (ref 3.5–5.1)
Sodium: 142 mEq/L (ref 135–145)

## 2020-08-10 LAB — CBC WITH DIFFERENTIAL/PLATELET
Basophils Absolute: 0 10*3/uL (ref 0.0–0.1)
Basophils Relative: 0.3 % (ref 0.0–3.0)
Eosinophils Absolute: 0 10*3/uL (ref 0.0–0.7)
Eosinophils Relative: 0.2 % (ref 0.0–5.0)
HCT: 37 % — ABNORMAL LOW (ref 39.0–52.0)
Hemoglobin: 12 g/dL — ABNORMAL LOW (ref 13.0–17.0)
Lymphocytes Relative: 17.9 % (ref 12.0–46.0)
Lymphs Abs: 1.4 10*3/uL (ref 0.7–4.0)
MCHC: 32.4 g/dL (ref 30.0–36.0)
MCV: 95 fl (ref 78.0–100.0)
Monocytes Absolute: 1.4 10*3/uL — ABNORMAL HIGH (ref 0.1–1.0)
Monocytes Relative: 18 % — ABNORMAL HIGH (ref 3.0–12.0)
Neutro Abs: 5 10*3/uL (ref 1.4–7.7)
Neutrophils Relative %: 63.6 % (ref 43.0–77.0)
Platelets: 68 10*3/uL — ABNORMAL LOW (ref 150.0–400.0)
RBC: 3.89 Mil/uL — ABNORMAL LOW (ref 4.22–5.81)
RDW: 17.3 % — ABNORMAL HIGH (ref 11.5–15.5)
WBC: 7.9 10*3/uL (ref 4.0–10.5)

## 2020-08-10 LAB — TSH: TSH: 1.59 u[IU]/mL (ref 0.35–5.50)

## 2020-08-10 LAB — CORTISOL: Cortisol, Plasma: 18.4 ug/dL

## 2020-08-10 NOTE — Progress Notes (Signed)
Hypotension   Subjective:  Patient ID: Robert Clayton, male    DOB: 12/01/1930  Age: 87 y.o. MRN: [id number]  CC: Hypertension  This visit occurred during the SARS-CoV-2 public health emergency.  Safety protocols were in place, including screening questions prior to the visit, additional usage of staff PPE, and extensive cleaning of exam room while observing appropriate contact time as indicated for disinfecting solutions.    HPI Kashus Karlen presents for f/up - He complains of a several day history of weight loss, dizziness, and lightheadedness.  Outpatient Medications Prior to Visit  Medication Sig Dispense Refill   acetaminophen (TYLENOL) 325 MG tablet Take 650 mg by mouth every 6 (six) hours as needed.     allopurinol (ZYLOPRIM) 300 MG tablet TAKE 1 TABLET BY MOUTH EVERY DAY 90 tablet 1   aspirin EC 81 MG tablet Take 81 mg by mouth daily.     atorvastatin (LIPITOR) 10 MG tablet TAKE 1 TABLET BY MOUTH EVERY DAY 90 tablet 1   B-D INS SYR ULTRAFINE 1CC/30G 30G X 1/2" 1 ML MISC USE AS DIRECTED TWICE A DAY 200 each 3   Continuous Blood Gluc Receiver (FREESTYLE LIBRE 2 READER) DEVI 1 Act by Does not apply route daily. 2 each 5   Continuous Blood Gluc Sensor (FREESTYLE LIBRE 2 SENSOR) MISC 1 Act by Does not apply route daily. 2 each 5   Glucagon (GVOKE HYPOPEN 2-PACK) 1 MG/0.2ML SOAJ Inject 1 Act into the skin daily as needed. 2 mL 5   Insulin NPH, Human,, Isophane, (NOVOLIN N FLEXPEN) 100 UNIT/ML Kiwkpen Inject 5 Units into the skin 3 (three) times daily before meals. 12 mL 2   isosorbide mononitrate (IMDUR) 30 MG 24 hr tablet TAKE 1 TABLET BY MOUTH EVERY DAY 90 tablet 1   Lancets (ONETOUCH DELICA PLUS [id number]) MISC TEST 2 TIMES DAILY. DX: E11.51 100 each 0   metoprolol tartrate (LOPRESSOR) 50 MG tablet TAKE 1 TABLET TWICE A DAY 180 tablet 1   nitroGLYCERIN (NITROSTAT) 0.4 MG SL tablet Place 1 tablet (0.4 mg total) under the tongue every 5 (five) minutes as needed. x3 doses as needed for  chest pain 30 tablet 0   omeprazole (PRILOSEC) 40 MG capsule TAKE 1 CAPSULE BY MOUTH EVERY DAY 90 capsule 1   ONETOUCH VERIO test strip USE TO TEST BLOOD SUGAR TWICE A DAY DX E11.51 200 strip 3   Suvorexant (BELSOMRA) 15 MG TABS Take 1 tablet by mouth at bedtime as needed. 90 tablet 1   telmisartan (MICARDIS) 80 MG tablet TAKE 1 TABLET BY MOUTH EVERY DAY 90 tablet 1   hydrochlorothiazide (HYDRODIURIL) 12.5 MG tablet TAKE 1 TABLET BY MOUTH EVERY DAY 90 tablet 1   No facility-administered medications prior to visit.    ROS Review of Systems  Constitutional:  Negative for diaphoresis and fatigue.  HENT: Negative.    Eyes: Negative.   Respiratory:  Negative for chest tightness, shortness of breath and wheezing.   Cardiovascular:  Negative for chest pain, palpitations and leg swelling.  Gastrointestinal:  Negative for abdominal pain, diarrhea, nausea and vomiting.  Endocrine: Negative.   Genitourinary: Negative.  Negative for difficulty urinating.  Musculoskeletal: Negative.   Skin: Negative.   Neurological:  Positive for dizziness and light-headedness. Negative for weakness, numbness and headaches.  Hematological:  Negative for adenopathy. Does not bruise/bleed easily.  Psychiatric/Behavioral: Negative.     Objective:  BP 100/86 (BP Location: Left Arm, Patient Position: Sitting, Cuff Size: Large)   Pulse 73  Temp 98.2 F (36.8 C) (Oral)   Ht 6' (1.829 m)   Wt 189 lb (85.7 kg)   SpO2 98%   BMI 25.63 kg/m   BP Readings from Last 3 Encounters:  08/10/20 100/86  07/20/20 138/70  06/26/20 (!) 142/70    Wt Readings from Last 3 Encounters:  08/10/20 189 lb (85.7 kg)  07/20/20 196 lb (88.9 kg)  06/26/20 195 lb (88.5 kg)    Physical Exam Vitals reviewed.  HENT:     Nose: Nose normal.     Mouth/Throat:     Mouth: Mucous membranes are moist.  Eyes:     Conjunctiva/sclera: Conjunctivae normal.  Cardiovascular:     Rate and Rhythm: Normal rate and regular rhythm.     Heart  sounds: S1 normal and S2 normal. Murmur heard.  Systolic murmur is present with a grade of 1/6.    No gallop.     Comments: EKG- NSR, 75 bpm Normal EKG Pulmonary:     Effort: Pulmonary effort is normal.     Breath sounds: No stridor. No wheezing, rhonchi or rales.  Abdominal:     General: Abdomen is flat.     Palpations: There is no mass.     Tenderness: There is no abdominal tenderness. There is no guarding.     Hernia: No hernia is present.  Musculoskeletal:     Right lower leg: No edema.     Left lower leg: No edema.  Skin:    General: Skin is warm and dry.     Coloration: Skin is not jaundiced.  Neurological:     Mental Status: He is alert. Mental status is at baseline.    Lab Results  Component Value Date   WBC 7.9 08/10/2020   HGB 12.0 (L) 08/10/2020   HCT 37.0 (L) 08/10/2020   PLT 68.0 (L) 08/10/2020   GLUCOSE 72 08/10/2020   CHOL 128 09/23/2019   TRIG 91 09/23/2019   HDL 36 (L) 09/23/2019   LDLDIRECT 80.6 06/26/2010   LDLCALC 75 09/23/2019   ALT 13 03/30/2018   AST 16 03/30/2018   NA 142 08/10/2020   K 4.7 08/10/2020   CL 111 08/10/2020   CREATININE 3.54 (H) 08/10/2020   BUN 41 (H) 08/10/2020   CO2 24 08/10/2020   TSH 1.59 08/10/2020   PSA 0.03 (L) 06/29/2009   INR 1.1 04/08/2008   HGBA1C 8.0 (H) 06/26/2020   MICROALBUR 2.4 09/23/2019    CT Head Wo Contrast  Result Date: 11/04/2017 CLINICAL DATA:  Acute headache, worst headache of life, right-sided, prior subdural hematoma years ago EXAM: CT HEAD WITHOUT CONTRAST TECHNIQUE: Contiguous axial images were obtained from the base of the skull through the vertex without intravenous contrast. COMPARISON:  CT brain scan of 08/29/2016 FINDINGS: Brain: The ventricular system remains prominent as are the cortical sulci indicative of diffuse atrophy. The septum is midline in position. Mild small vessel ischemic change remains throughout the periventricular white matter. No hemorrhage, mass lesion, or acute infarction  is seen. Vascular: No vascular abnormality is evident on this unenhanced study. Skull: Changes of right parietal craniotomy again are noted. No complicating features are seen. Sinuses/Orbits: The paranasal sinuses appear well pneumatized. Other: None. IMPRESSION: 1. No change in atrophy and mild small vessel ischemic change in the periventricular white matter. 2. Stable right parietal craniotomy. Electronically Signed   By: Ivar Drape M.D.   On: 11/04/2017 10:24    Assessment & Plan:   Marquavious was seen today for  hypertension.  Diagnoses and all orders for this visit:  Hypotension, unspecified hypotension type- His anemia has not worsened, his cortisol is normal, he is prerenal so will d'c HCTZ. -     CBC with Differential/Platelet; Future -     Basic metabolic panel; Future -     Cortisol; Future -     EKG 12-Lead -     Cortisol -     Basic metabolic panel -     CBC with Differential/Platelet  Deficiency anemia -     CBC with Differential/Platelet; Future -     Vitamin B1; Future -     Vitamin B1 -     CBC with Differential/Platelet  CRI (chronic renal insufficiency), stage 3 (moderate) (HCC) -     Basic metabolic panel; Future -     Basic metabolic panel  Hyperlipidemia with target LDL less than 70 -     TSH; Future -     TSH  I have discontinued Nicolai Mccormac's hydrochlorothiazide. I am also having him maintain his nitroGLYCERIN, acetaminophen, aspirin EC, telmisartan, Belsomra, B-D INS SYR ULTRAFINE 1CC/30G, Gvoke HypoPen 2-Pack, FreeStyle Libre 2 Sensor, [employer] 2 Reader, allopurinol, omeprazole, isosorbide mononitrate, OneTouch Delica Plus [id number], OneTouch Verio, metoprolol tartrate, atorvastatin, and NovoLIN N FlexPen.  No orders of the defined types were placed in this encounter.    Follow-up: No follow-ups on file.  [name], MD

## 2020-08-11 ENCOUNTER — Ambulatory Visit: Payer: Medicare Other

## 2020-08-16 LAB — VITAMIN B1: Vitamin B1 (Thiamine): 10 nmol/L (ref 8–30)

## 2020-08-18 ENCOUNTER — Other Ambulatory Visit: Payer: Self-pay | Admitting: Internal Medicine

## 2020-08-18 DIAGNOSIS — E1151 Type 2 diabetes mellitus with diabetic peripheral angiopathy without gangrene: Secondary | ICD-10-CM

## 2020-08-18 DIAGNOSIS — I25110 Atherosclerotic heart disease of native coronary artery with unstable angina pectoris: Secondary | ICD-10-CM

## 2020-08-18 DIAGNOSIS — IMO0002 Reserved for concepts with insufficient information to code with codable children: Secondary | ICD-10-CM

## 2020-08-18 DIAGNOSIS — I129 Hypertensive chronic kidney disease with stage 1 through stage 4 chronic kidney disease, or unspecified chronic kidney disease: Secondary | ICD-10-CM

## 2020-09-13 ENCOUNTER — Telehealth: Payer: Self-pay | Admitting: Cardiovascular Disease

## 2020-09-13 ENCOUNTER — Emergency Department (HOSPITAL_COMMUNITY): Payer: Medicare Other

## 2020-09-13 ENCOUNTER — Encounter (HOSPITAL_COMMUNITY): Payer: Self-pay | Admitting: Emergency Medicine

## 2020-09-13 ENCOUNTER — Telehealth: Payer: Self-pay | Admitting: Internal Medicine

## 2020-09-13 ENCOUNTER — Observation Stay (HOSPITAL_COMMUNITY)
Admission: EM | Admit: 2020-09-13 | Discharge: 2020-09-14 | Disposition: A | Discharge status: Home / Self Care | Payer: Medicare Other | Attending: Emergency Medicine | Admitting: Emergency Medicine

## 2020-09-13 ENCOUNTER — Other Ambulatory Visit: Payer: Self-pay

## 2020-09-13 DIAGNOSIS — I25110 Atherosclerotic heart disease of native coronary artery with unstable angina pectoris: Secondary | ICD-10-CM | POA: Diagnosis not present

## 2020-09-13 DIAGNOSIS — Z7982 Long term (current) use of aspirin: Secondary | ICD-10-CM | POA: Insufficient documentation

## 2020-09-13 DIAGNOSIS — I200 Unstable angina: Secondary | ICD-10-CM | POA: Diagnosis not present

## 2020-09-13 DIAGNOSIS — Z79899 Other long term (current) drug therapy: Secondary | ICD-10-CM | POA: Diagnosis not present

## 2020-09-13 DIAGNOSIS — I10 Essential (primary) hypertension: Secondary | ICD-10-CM | POA: Diagnosis present

## 2020-09-13 DIAGNOSIS — N183 Chronic kidney disease, stage 3 unspecified: Secondary | ICD-10-CM | POA: Diagnosis present

## 2020-09-13 DIAGNOSIS — R1013 Epigastric pain: Secondary | ICD-10-CM

## 2020-09-13 DIAGNOSIS — I169 Hypertensive crisis, unspecified: Secondary | ICD-10-CM | POA: Diagnosis not present

## 2020-09-13 DIAGNOSIS — N184 Chronic kidney disease, stage 4 (severe): Secondary | ICD-10-CM | POA: Insufficient documentation

## 2020-09-13 DIAGNOSIS — IMO0002 Reserved for concepts with insufficient information to code with codable children: Secondary | ICD-10-CM | POA: Diagnosis present

## 2020-09-13 DIAGNOSIS — R072 Precordial pain: Secondary | ICD-10-CM | POA: Diagnosis not present

## 2020-09-13 DIAGNOSIS — R6889 Other general symptoms and signs: Secondary | ICD-10-CM | POA: Diagnosis not present

## 2020-09-13 DIAGNOSIS — E1165 Type 2 diabetes mellitus with hyperglycemia: Secondary | ICD-10-CM | POA: Diagnosis not present

## 2020-09-13 DIAGNOSIS — I5030 Unspecified diastolic (congestive) heart failure: Secondary | ICD-10-CM | POA: Diagnosis not present

## 2020-09-13 DIAGNOSIS — Z8546 Personal history of malignant neoplasm of prostate: Secondary | ICD-10-CM | POA: Insufficient documentation

## 2020-09-13 DIAGNOSIS — E1122 Type 2 diabetes mellitus with diabetic chronic kidney disease: Secondary | ICD-10-CM | POA: Insufficient documentation

## 2020-09-13 DIAGNOSIS — R079 Chest pain, unspecified: Secondary | ICD-10-CM | POA: Diagnosis present

## 2020-09-13 DIAGNOSIS — R0789 Other chest pain: Secondary | ICD-10-CM | POA: Diagnosis not present

## 2020-09-13 DIAGNOSIS — Z794 Long term (current) use of insulin: Secondary | ICD-10-CM | POA: Insufficient documentation

## 2020-09-13 DIAGNOSIS — R109 Unspecified abdominal pain: Secondary | ICD-10-CM | POA: Diagnosis not present

## 2020-09-13 DIAGNOSIS — U071 COVID-19: Secondary | ICD-10-CM | POA: Diagnosis not present

## 2020-09-13 DIAGNOSIS — I130 Hypertensive heart and chronic kidney disease with heart failure and stage 1 through stage 4 chronic kidney disease, or unspecified chronic kidney disease: Secondary | ICD-10-CM | POA: Diagnosis not present

## 2020-09-13 DIAGNOSIS — E1151 Type 2 diabetes mellitus with diabetic peripheral angiopathy without gangrene: Secondary | ICD-10-CM | POA: Diagnosis not present

## 2020-09-13 DIAGNOSIS — E119 Type 2 diabetes mellitus without complications: Secondary | ICD-10-CM

## 2020-09-13 DIAGNOSIS — Z743 Need for continuous supervision: Secondary | ICD-10-CM | POA: Diagnosis not present

## 2020-09-13 LAB — BASIC METABOLIC PANEL
Anion gap: 5 (ref 5–15)
BUN: 28 mg/dL — ABNORMAL HIGH (ref 8–23)
CO2: 24 mmol/L (ref 22–32)
Calcium: 9.1 mg/dL (ref 8.9–10.3)
Chloride: 113 mmol/L — ABNORMAL HIGH (ref 98–111)
Creatinine, Ser: 2.44 mg/dL — ABNORMAL HIGH (ref 0.61–1.24)
GFR, Estimated: 25 mL/min — ABNORMAL LOW (ref >60)
Glucose, Bld: 86 mg/dL (ref 70–99)
Potassium: 4.8 mmol/L (ref 3.5–5.1)
Sodium: 142 mmol/L (ref 135–145)

## 2020-09-13 LAB — CBC
HCT: 36.2 % — ABNORMAL LOW (ref 39.0–52.0)
Hemoglobin: 11.4 g/dL — ABNORMAL LOW (ref 13.0–17.0)
MCH: 31.1 pg (ref 26.0–34.0)
MCHC: 31.5 g/dL (ref 30.0–36.0)
MCV: 98.9 fL (ref 80.0–100.0)
Platelets: 77 10*3/uL — ABNORMAL LOW (ref 150–400)
RBC: 3.66 MIL/uL — ABNORMAL LOW (ref 4.22–5.81)
RDW: 16.9 % — ABNORMAL HIGH (ref 11.5–15.5)
WBC: 7.9 10*3/uL (ref 4.0–10.5)
nRBC: 0 % (ref 0.0–0.2)

## 2020-09-13 LAB — PROTIME-INR
INR: 1.1 (ref 0.8–1.2)
Prothrombin Time: 14 seconds (ref 11.4–15.2)

## 2020-09-13 LAB — RESP PANEL BY RT-PCR (FLU A&B, COVID) ARPGX2
Influenza A by PCR: NEGATIVE
Influenza B by PCR: NEGATIVE
SARS Coronavirus 2 by RT PCR: POSITIVE — AB

## 2020-09-13 LAB — TROPONIN I (HIGH SENSITIVITY)
Troponin I (High Sensitivity): 53 ng/L — ABNORMAL HIGH (ref <18)
Troponin I (High Sensitivity): 40 ng/L — ABNORMAL HIGH (ref <18)

## 2020-09-13 LAB — CBG MONITORING, ED: Glucose-Capillary: 139 mg/dL — ABNORMAL HIGH (ref 70–99)

## 2020-09-13 MED ORDER — PANTOPRAZOLE SODIUM 40 MG PO TBEC
40.0000 mg | DELAYED_RELEASE_TABLET | Freq: Every day | ORAL | Status: DC
  Administered 2020-09-13 – 2020-09-14 (×2): 40 mg via ORAL
  Filled 2020-09-13 (×2): qty 1

## 2020-09-13 MED ORDER — ACETAMINOPHEN 650 MG RE SUPP: 650.0000 mg | Freq: Four times a day (QID) | RECTAL | Status: DC | PRN

## 2020-09-13 MED ORDER — METOPROLOL TARTRATE 50 MG PO TABS
50.0000 mg | ORAL_TABLET | Freq: Two times a day (BID) | ORAL | Status: DC
  Administered 2020-09-13 – 2020-09-14 (×2): 50 mg via ORAL
  Filled 2020-09-13: qty 2
  Filled 2020-09-13: qty 1

## 2020-09-13 MED ORDER — ACETAMINOPHEN 325 MG PO TABS: 650.0000 mg | ORAL_TABLET | Freq: Four times a day (QID) | ORAL | Status: DC | PRN

## 2020-09-13 MED ORDER — HEPARIN (PORCINE) 25000 UT/250ML-% IV SOLN
1100.0000 [IU]/h | INTRAVENOUS | Status: DC
  Administered 2020-09-13: 1100 [IU]/h via INTRAVENOUS
  Filled 2020-09-13: qty 250

## 2020-09-13 MED ORDER — INSULIN ASPART 100 UNIT/ML IJ SOLN
0.0000 [IU] | INTRAMUSCULAR | Status: DC
  Administered 2020-09-14: 1 [IU] via SUBCUTANEOUS

## 2020-09-13 MED ORDER — IRBESARTAN 150 MG PO TABS
300.0000 mg | ORAL_TABLET | Freq: Every day | ORAL | Status: DC
  Administered 2020-09-13 – 2020-09-14 (×2): 300 mg via ORAL
  Filled 2020-09-13: qty 1
  Filled 2020-09-13: qty 2

## 2020-09-13 MED ORDER — NITROGLYCERIN 0.4 MG SL SUBL: 0.4000 mg | SUBLINGUAL_TABLET | SUBLINGUAL | 0 refills | Status: AC | PRN

## 2020-09-13 MED ORDER — ALLOPURINOL 300 MG PO TABS
300.0000 mg | ORAL_TABLET | Freq: Every day | ORAL | Status: DC
  Administered 2020-09-14: 300 mg via ORAL
  Filled 2020-09-13: qty 1

## 2020-09-13 MED ORDER — ISOSORBIDE MONONITRATE ER 30 MG PO TB24
30.0000 mg | ORAL_TABLET | Freq: Every day | ORAL | Status: DC
  Administered 2020-09-13 – 2020-09-14 (×2): 30 mg via ORAL
  Filled 2020-09-13 (×2): qty 1

## 2020-09-13 MED ORDER — ATORVASTATIN CALCIUM 10 MG PO TABS
10.0000 mg | ORAL_TABLET | Freq: Every day | ORAL | Status: DC
  Administered 2020-09-13 – 2020-09-14 (×2): 10 mg via ORAL
  Filled 2020-09-13 (×2): qty 1

## 2020-09-13 MED ORDER — NITROGLYCERIN 0.4 MG SL SUBL
0.4000 mg | SUBLINGUAL_TABLET | SUBLINGUAL | Status: DC | PRN
  Administered 2020-09-13: 0.4 mg via SUBLINGUAL
  Filled 2020-09-13: qty 1

## 2020-09-13 MED ORDER — ISOSORBIDE MONONITRATE ER 30 MG PO TB24: 30.0000 mg | ORAL_TABLET | Freq: Every day | ORAL | Status: DC

## 2020-09-13 MED ORDER — ASPIRIN EC 81 MG PO TBEC
81.0000 mg | DELAYED_RELEASE_TABLET | Freq: Every day | ORAL | Status: DC
  Administered 2020-09-13 – 2020-09-14 (×2): 81 mg via ORAL
  Filled 2020-09-13 (×2): qty 1

## 2020-09-13 NOTE — Telephone Encounter (Signed)
Noted  

## 2020-09-13 NOTE — Telephone Encounter (Signed)
1.Medication Requested: nitroGLYCERIN (NITROSTAT) 0.4 MG SL tablet   2. Pharmacy ([place], [place] [place]): [id number] - [place], [place] - [place] [address]  Phone:  [phone] Fax:  [phone]   3. On Med List: yes  4. Last Visit with PCP: 08.04.22  5. Next visit date with PCP: 10.24.22   Agent: Please be advised that RX refills may take up to 3 business days. We ask that you follow-up with your pharmacy.   **Patient was also complaining of chest pain.[name] transferred him to team health**

## 2020-09-13 NOTE — ED Provider Notes (Signed)
Emergency Medicine Provider Triage Evaluation Note  Daiel Strohecker , a 87 y.o. male  was evaluated in triage.  Pt complains of chest pain   Review of Systems  Positive: Pain upper chest Negative: No fever   Physical Exam  BP (!) 177/73 (BP Location: Right Arm)   Pulse 60   Temp 98.7 F (37.1 C) (Oral)   Resp 18   SpO2 100%  Gen:   Awake, no distress   Resp:  Normal effort  MSK:   Moves extremities without difficulty  Other:    Medical Decision Making  Medically screening exam initiated at 11:17 AM.  Appropriate orders placed.  Wrangler Penning was informed that the remainder of the evaluation will be completed by another provider, this initial triage assessment does not replace that evaluation, and the importance of remaining in the ED until their evaluation is complete.     [name] 09/13/20 1118    Carmin Muskrat, MD 09/13/20 [phone]
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
## 2020-09-13 NOTE — Telephone Encounter (Signed)
[facility] FYI   Caller states that he is having chest pain. It started yesterday. It is coming & going. He is having it now. It is a mild pain.  Advised to call EMS now. Patient understood and reached out to his cardiologist.

## 2020-09-13 NOTE — Plan of Care (Signed)

## 2020-09-13 NOTE — ED Triage Notes (Signed)
Pt to triage via [facility] from home.  Reports epigastric pain yesterday.  Called his doctor that told him to come to ED.  Per EMS pt denies any symptoms today.  Pt reports he does have 1/10 pain to center of chest/epigastric area.  Denies sob, nausea, and vomiting.  Took TUMS without relief.

## 2020-09-13 NOTE — Telephone Encounter (Signed)
Spoke with pt who reports he is having active CP without radiation or additional symptoms.  Pt states he has not had CP in years and is out of nitro even though his PCP just sent Rx. Pt advised with active CP RN recommends pt contact 911 for further evaluation and transport to ED.  Pt verbalizes understanding and is agreeable with current plan.

## 2020-09-13 NOTE — Telephone Encounter (Signed)
Pt c/o of Chest Pain: STAT if CP now or developed within 24 hours  1. Are you having CP right now?  Yes started yesterday  2. Are you experiencing any other symptoms (ex. SOB, nausea, vomiting, sweating)? No  3. How long have you been experiencing CP?  Since yesterday  4. Is your CP continuous or coming and going? Mild continuous pain  5. Have you taken Nitroglycerin? No pt is out of Nitro   Pt was advised by Dr. Ronnald Ramp PCP to go to the ED but pt does not want to go. ?
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
## 2020-09-14 ENCOUNTER — Observation Stay (HOSPITAL_COMMUNITY): Payer: Medicare Other

## 2020-09-14 ENCOUNTER — Observation Stay (HOSPITAL_BASED_OUTPATIENT_CLINIC_OR_DEPARTMENT_OTHER): Payer: Medicare Other

## 2020-09-14 DIAGNOSIS — I10 Essential (primary) hypertension: Secondary | ICD-10-CM | POA: Diagnosis not present

## 2020-09-14 DIAGNOSIS — E1151 Type 2 diabetes mellitus with diabetic peripheral angiopathy without gangrene: Secondary | ICD-10-CM | POA: Diagnosis not present

## 2020-09-14 DIAGNOSIS — N184 Chronic kidney disease, stage 4 (severe): Secondary | ICD-10-CM | POA: Diagnosis not present

## 2020-09-14 DIAGNOSIS — R072 Precordial pain: Secondary | ICD-10-CM | POA: Diagnosis not present

## 2020-09-14 DIAGNOSIS — E1165 Type 2 diabetes mellitus with hyperglycemia: Secondary | ICD-10-CM | POA: Diagnosis not present

## 2020-09-14 DIAGNOSIS — R079 Chest pain, unspecified: Secondary | ICD-10-CM | POA: Diagnosis not present

## 2020-09-14 DIAGNOSIS — U071 COVID-19: Secondary | ICD-10-CM | POA: Diagnosis not present

## 2020-09-14 DIAGNOSIS — I2510 Atherosclerotic heart disease of native coronary artery without angina pectoris: Secondary | ICD-10-CM | POA: Diagnosis not present

## 2020-09-14 DIAGNOSIS — K8689 Other specified diseases of pancreas: Secondary | ICD-10-CM | POA: Diagnosis not present

## 2020-09-14 DIAGNOSIS — K5730 Diverticulosis of large intestine without perforation or abscess without bleeding: Secondary | ICD-10-CM | POA: Diagnosis not present

## 2020-09-14 DIAGNOSIS — I700 Atherosclerosis of aorta: Secondary | ICD-10-CM | POA: Diagnosis not present

## 2020-09-14 DIAGNOSIS — N281 Cyst of kidney, acquired: Secondary | ICD-10-CM | POA: Diagnosis not present

## 2020-09-14 LAB — HEPATIC FUNCTION PANEL
ALT: 9 U/L (ref 0–44)
AST: 14 U/L — ABNORMAL LOW (ref 15–41)
Albumin: 3.1 g/dL — ABNORMAL LOW (ref 3.5–5.0)
Alkaline Phosphatase: 69 U/L (ref 38–126)
Bilirubin, Direct: 0.2 mg/dL (ref 0.0–0.2)
Indirect Bilirubin: 0.6 mg/dL (ref 0.3–0.9)
Total Bilirubin: 0.8 mg/dL (ref 0.3–1.2)
Total Protein: 5.9 g/dL — ABNORMAL LOW (ref 6.5–8.1)

## 2020-09-14 LAB — CBC
HCT: 35.7 % — ABNORMAL LOW (ref 39.0–52.0)
Hemoglobin: 11.4 g/dL — ABNORMAL LOW (ref 13.0–17.0)
MCH: 30.6 pg (ref 26.0–34.0)
MCHC: 31.9 g/dL (ref 30.0–36.0)
MCV: 95.7 fL (ref 80.0–100.0)
Platelets: 72 10*3/uL — ABNORMAL LOW (ref 150–400)
RBC: 3.73 MIL/uL — ABNORMAL LOW (ref 4.22–5.81)
RDW: 16.6 % — ABNORMAL HIGH (ref 11.5–15.5)
WBC: 8.3 10*3/uL (ref 4.0–10.5)
nRBC: 0 % (ref 0.0–0.2)

## 2020-09-14 LAB — BASIC METABOLIC PANEL
Anion gap: 9 (ref 5–15)
BUN: 25 mg/dL — ABNORMAL HIGH (ref 8–23)
CO2: 22 mmol/L (ref 22–32)
Calcium: 9.3 mg/dL (ref 8.9–10.3)
Chloride: 113 mmol/L — ABNORMAL HIGH (ref 98–111)
Creatinine, Ser: 2.25 mg/dL — ABNORMAL HIGH (ref 0.61–1.24)
GFR, Estimated: 28 mL/min — ABNORMAL LOW (ref >60)
Glucose, Bld: 96 mg/dL (ref 70–99)
Potassium: 4.6 mmol/L (ref 3.5–5.1)
Sodium: 144 mmol/L (ref 135–145)

## 2020-09-14 LAB — GLUCOSE, CAPILLARY
Glucose-Capillary: 127 mg/dL — ABNORMAL HIGH (ref 70–99)
Glucose-Capillary: 130 mg/dL — ABNORMAL HIGH (ref 70–99)
Glucose-Capillary: 154 mg/dL — ABNORMAL HIGH (ref 70–99)
Glucose-Capillary: 53 mg/dL — ABNORMAL LOW (ref 70–99)
Glucose-Capillary: 89 mg/dL (ref 70–99)
Glucose-Capillary: 95 mg/dL (ref 70–99)

## 2020-09-14 LAB — ECHOCARDIOGRAM COMPLETE
Area-P 1/2: 2.45 cm2
Height: 71.5 in
S' Lateral: 2.2 cm
Weight: 3089.97 oz

## 2020-09-14 LAB — MRSA NEXT GEN BY PCR, NASAL: MRSA by PCR Next Gen: NOT DETECTED

## 2020-09-14 LAB — D-DIMER, QUANTITATIVE: D-Dimer, Quant: 1.2 ug/mL-FEU — ABNORMAL HIGH (ref 0.00–0.50)

## 2020-09-14 LAB — LIPASE, BLOOD: Lipase: 22 U/L (ref 11–51)

## 2020-09-14 LAB — HEPARIN LEVEL (UNFRACTIONATED): Heparin Unfractionated: 0.52 IU/mL (ref 0.30–0.70)

## 2020-09-14 LAB — C-REACTIVE PROTEIN: CRP: 0.7 mg/dL (ref <1.0)

## 2020-09-14 MED ORDER — ALBUTEROL SULFATE HFA 108 (90 BASE) MCG/ACT IN AERS: 2.0000 | INHALATION_SPRAY | Freq: Once | RESPIRATORY_TRACT | Status: AC | PRN

## 2020-09-14 MED ORDER — FAMOTIDINE 20 MG IN NS 100 ML IVPB: 20.0000 mg | Freq: Once | INTRAVENOUS | Status: AC | PRN

## 2020-09-14 MED ORDER — NIRMATRELVIR/RITONAVIR (PAXLOVID) TABLET (RENAL DOSING): 2.0000 | ORAL_TABLET | Freq: Two times a day (BID) | ORAL | Status: DC

## 2020-09-14 MED ORDER — FAMOTIDINE 20 MG PO TABS
10.0000 mg | ORAL_TABLET | Freq: Every day | ORAL | Status: DC
  Administered 2020-09-14: 10 mg via ORAL
  Filled 2020-09-14: qty 1

## 2020-09-14 MED ORDER — DIPHENHYDRAMINE HCL 50 MG/ML IJ SOLN: 50.0000 mg | Freq: Once | INTRAMUSCULAR | Status: AC | PRN

## 2020-09-14 MED ORDER — EPINEPHRINE 0.3 MG/0.3ML IJ SOAJ: 0.3000 mg | Freq: Once | INTRAMUSCULAR | Status: AC | PRN

## 2020-09-14 MED ORDER — HYDRALAZINE HCL 20 MG/ML IJ SOLN
10.0000 mg | INTRAMUSCULAR | Status: DC | PRN
  Administered 2020-09-14: 10 mg via INTRAVENOUS
  Filled 2020-09-14: qty 1

## 2020-09-14 MED ORDER — SODIUM CHLORIDE 0.9 % IV SOLN: INTRAVENOUS | Status: DC | PRN

## 2020-09-14 MED ORDER — METHYLPREDNISOLONE SODIUM SUCC 125 MG IJ SOLR: 125.0000 mg | Freq: Once | INTRAMUSCULAR | Status: AC | PRN

## 2020-09-14 MED ORDER — HEPARIN SODIUM (PORCINE) 5000 UNIT/ML IJ SOLN
5000.0000 [IU] | Freq: Three times a day (TID) | INTRAMUSCULAR | Status: DC
  Administered 2020-09-14: 5000 [IU] via SUBCUTANEOUS
  Filled 2020-09-14: qty 1

## 2020-09-14 MED ORDER — BEBTELOVIMAB 175 MG/2 ML IV (EUA): 175.0000 mg | Freq: Once | INTRAMUSCULAR | Status: AC

## 2020-09-14 NOTE — Plan of Care (Signed)

## 2020-09-14 NOTE — Progress Notes (Signed)
Patient's daughter arrived. Patient assisted to car by NT.

## 2020-09-14 NOTE — Progress Notes (Signed)
Discharge instructions provided to the patient. Patient states that he does not have any questions. Awaiting transportation.

## 2020-09-14 NOTE — Discharge Summary (Signed)
Physician Discharge Summary  Robert Clayton MEQ:[id number] DOB: 04/18/1930 DOA: 09/13/2020  PCP: Robert Lima, MD  Admit date: 09/13/2020 Discharge date: 09/14/2020  Admitted From: Home Disposition: Home  Recommendations for Outpatient Follow-up:  Follow up with PCP in 1-2 weeks Follow-up with gastroenterology within 1 to 2 weeks Follow-up with cardiology within 1 to 2 weeks Please obtain CMP/CBC, Mag, Phos in one week Please follow up on the following pending results:  Home Health: No  Equipment/Devices: None    Discharge Condition: Stable CODE STATUS: FULL CODE Diet recommendation: Heart Healthy Carb Modified Diet   Brief/Interim Summary: HPI per Dr. Gean Clayton on 09/13/20 HPI: Robert Clayton is a 87 y.o. male with history of CAD status post stenting, chronic kidney disease stage IV, anemia, hypertension, diabetes mellitus type 2 presents to the ER with complaint of chest pain.  Patient started having chest pain last night which was epigastric and substernal.  Was pressure-like.  Did not improve with sublingual nitroglycerin.  Patient chest pain resolved last night but started recurring again today.  He denies any associated shortness of breath productive cough fever chills.  Patient also indicates pain is around the epigastric area.     ED Course: In the ER patient's EKG shows sinus bradycardia with early repolarization changes with high sensitive troponins 53 and 40.  Chest x-ray was unremarkable.  Cardiology was consulted.  Patient was started on heparin.  Since there was epigastric pain cardiology also requested work-up for epigastric pain.  Patient blood pressure was found to be markedly elevated likely causing his symptoms.  Patient was restarted on his home medications including Imdur beta-blockers and ARB.  Patient admitted for further work-up of chest pain and epigastric discomfort.  COVID test eventually came back positive.  Patient denies any productive cough or shortness of  breath.   **Interim History Patient was evaluated for epigastric pain and chest pain and chest pain and epigastric pain improved.  Cardiology was consulted and he initially was placed on a heparin drip with concern for unstable angina but cardiology did not feel it is related to ACS given that his trending troponins were flat.  They did not feel that a catheterization should be pursued given that he is at risk for contrast nephropathy.  They recommended planning medical therapy and further evaluation of his epigastric.  His echocardiogram was done and showed a EF of 65 to 70% and since it was normal cardiology felt that he could be discharge from a cardiac standpoint follow-up with Dr. Burt Clayton in outpatient setting.  Blood pressure was much improved and they are recommending continuing present medications and following.  Renal function is mildly improved as well.  Patient did test positive for COVID-19 disease however he is not a candidate for Paxlovid given his creatinine clearance.  He was ordered for monoclonal antibodies however we do not have monoclonal antibodies and house now and they are all at the outpatient infusion center.  He is advised to go to outpatient infusion center if he desires to get the monoclonal antibody.  He is he medically stable as he improved and his symptoms resolved.  Gastroenterology will follow up with the patient in 1 to 2 weeks for outpatient evaluation of his epigastric pain given that his lipase level was normal and that he had a negative CT of the abdomen pelvis.  Discharge Diagnoses:  Principal Problem:   Chest pain Active Problems:   DM (diabetes mellitus), type 2, uncontrolled, periph vascular complic ([place])   CRI (  chronic renal insufficiency), stage 3 (moderate) (HCC)   Essential hypertension  Chest pain with history of CAD was concerning for unstable angina  -Troponin was mildly elevated and trended down and went from 53-40 -appreciate cardiology inpu and he was  initially placed on a heparin drip which now stopped by cardiology  -patient is also restarted on his Imdur and beta-blockers and statins.  Aspirin.  Further recommendations per cardiology and echocardiogram was ordered and it had a normal EF so cardiology felt no inpatient work-up was needed at this time and they felt that he did not need to catheterization given that he was at risk for contrast nephropathy -Cardiology recommend outpatient follow-up with Dr. Burt Clayton.  Epigastric discomfort  -LFTs and lipase were normal -CT of the abdomen pelvis showed "No acute abdominal/pelvic findings, mass lesions or adenopathy. No renal, ureteral or bladder calculi or mass. Status post cholecystectomy. No biliary dilatation. Advanced atherosclerotic calcifications involving the aorta and bnranch vessels. Brachytherapy seeds throughout the prostate gland. Aortic Atherosclerosis." -Epigastric discomfort improved significantly and is back to his baseline -Tolerated diet without issues -Referral was made to GI for outpatient evaluation given that he does have a history of esophageal stricture  COVID-19 infection  -presently appears to be asymptomatic.   -COVID-19 Labs  Recent Labs    09/14/20 0357  DDIMER 1.20*  CRP 0.7    Lab Results  Component Value Date   SARSCOV2NAA POSITIVE (A) 09/13/2020   -Unfortunately is not a candidate for Paxlovid given his creatinine clearance -Offered monoclonal antibodies however we do not have them in house anymore and he was advised to go to the outpatient infusion center for monoclonal antibodies -Currently asymptomatic and CRP is normal  Hypertensive urgency - likely contributing to most of the patient's symptoms.  As needed IV hydralazine has been ordered.   -Continue patient's Imdur beta-blockers and ARB. -Pressure improved significantly and at the time of discharge was 134/81  Chronic kidney disease stage IV  -Creatinine appears to be at baseline.  Note that  patient is on ARB.  Diabetes mellitus type 2 -Resume home regimen. -CBGs while he was hospitalized ranged from 53-1 54  Chronic anemia and thrombocytopenia -Stable and follow-up in outpatient setting  Discharge Instructions  Discharge Instructions     Call MD for:  difficulty breathing, headache or visual disturbances   Complete by: As directed    Call MD for:  extreme fatigue   Complete by: As directed    Call MD for:  hives   Complete by: As directed    Call MD for:  persistant dizziness or light-headedness   Complete by: As directed    Call MD for:  persistant nausea and vomiting   Complete by: As directed    Call MD for:  redness, tenderness, or signs of infection (pain, swelling, redness, odor or green/yellow discharge around incision site)   Complete by: As directed    Call MD for:  severe uncontrolled pain   Complete by: As directed    Call MD for:  temperature >100.4   Complete by: As directed    Diet - low sodium heart healthy   Complete by: As directed    Diet Carb Modified   Complete by: As directed    Discharge instructions   Complete by: As directed    You were cared for by a hospitalist during your hospital stay. If you have any questions about your discharge medications or the care you received while you were in the  hospital after you are discharged, you can call the unit and ask to speak with the hospitalist on call if the hospitalist that took care of you is not available. Once you are discharged, your primary care physician will handle any further medical issues. Please note that NO REFILLS for any discharge medications will be authorized once you are discharged, as it is imperative that you return to your primary care physician (or establish a relationship with a primary care physician if you do not have one) for your aftercare needs so that they can reassess your need for medications and monitor your lab values.  Follow up with PCP and Gastroenterology within  1-2 weeks. Take all medications as prescribed. If symptoms change or worsen please return to the ED for evaluation   Hypersensitivity GRADE 1: Transient flushing or rash, or drug fever < 100.4 F   Complete by: As directed    Routine, As needed For Until specified Hold infusion for at least 30 minutes Restart infusion at 50% infusion rate if asymptomatic   Hypersensitivity GRADE 2: Rash, flushing, urticaria, dyspnea, or drug fever = or > 100.4 F   Complete by: As directed    Routine, As needed For Until specified Hold infusion for 30 minutes Give medications as ordered   Hypersensitivity GRADE 3: symptomatic bronchospasm, with or without urticaria, parenteral medication management indicated, allergy-related edema/angioedema, or hypotension   Complete by: As directed    Routine, As needed For Until specified Hold infusion for at least 30 minutes Give medications as ordered   Hypersensitivity GRADE 4: Anaphylaxis   Complete by: As directed    Routine, As needed For Until specified Hold infusion Give medications as ordered Call MD   Increase activity slowly   Complete by: As directed       Allergies as of 09/14/2020       Reactions   Amlodipine Other (See Comments)   headache   Lipitor [atorvastatin Calcium] Other (See Comments)   Leg cramps Patient last filled lipito 06/23/20 for 90DS         Medication List     TAKE these medications    acetaminophen 325 MG tablet Commonly known as: TYLENOL Take 650 mg by mouth every 6 (six) hours as needed for mild pain.   allopurinol 300 MG tablet Commonly known as: ZYLOPRIM TAKE 1 TABLET BY MOUTH EVERY DAY   aspirin EC 81 MG tablet Take 81 mg by mouth daily.   atorvastatin 10 MG tablet Commonly known as: LIPITOR TAKE 1 TABLET BY MOUTH EVERY DAY   B-D INS SYR ULTRAFINE 1CC/30G 30G X 1/2" 1 ML Misc Generic drug: Insulin Syringe-Needle U-100 USE AS DIRECTED TWICE A DAY   Belsomra 15 MG Tabs Generic drug: Suvorexant Take 1  tablet by mouth at bedtime as needed. What changed:  how much to take reasons to take this   FreeStyle Libre 2 Reader [facility] 1 Act by Does not apply route daily.   FreeStyle Libre 2 Sensor Misc 1 Act by Does not apply route daily.   Gvoke HypoPen 2-Pack 1 MG/0.2ML Soaj Generic drug: Glucagon Inject 1 Act into the skin daily as needed. What changed: reasons to take this   hydrochlorothiazide 12.5 MG tablet Commonly known as: HYDRODIURIL Take 12.5 mg by mouth daily.   isosorbide mononitrate 30 MG 24 hr tablet Commonly known as: IMDUR TAKE 1 TABLET BY MOUTH EVERY DAY   metoprolol tartrate 50 MG tablet Commonly known as: LOPRESSOR TAKE 1 TABLET TWICE A  DAY   nitroGLYCERIN 0.4 MG SL tablet Commonly known as: NITROSTAT Place 1 tablet (0.4 mg total) under the tongue every 5 (five) minutes as needed. x3 doses as needed for chest pain What changed:  when to take this reasons to take this additional instructions   NovoLIN N FlexPen 100 UNIT/ML Kiwkpen Generic drug: Insulin NPH (Human) (Isophane) Inject 5 Units into the skin 3 (three) times daily before meals. What changed:  how much to take when to take this additional instructions   omeprazole 40 MG capsule Commonly known as: PRILOSEC TAKE 1 CAPSULE BY MOUTH EVERY DAY What changed: how much to take   OneTouch Delica Plus [id number] Misc TEST 2 TIMES DAILY. DX: E11.51   OneTouch Verio test strip Generic drug: glucose blood USE TO TEST BLOOD SUGAR TWICE A DAY DX E11.51   telmisartan 80 MG tablet Commonly known as: MICARDIS TAKE 1 TABLET BY MOUTH EVERY DAY        Follow-up Information     Robert Lima, MD. Call.   Specialty: Internal Medicine Why: Follow up within 1-2 weeks Contact information: [place] [address] [phone]                Allergies  Allergen Reactions   Amlodipine Other (See Comments)    headache   Lipitor [Atorvastatin Calcium] Other (See Comments)     Leg cramps Patient last filled lipito 06/23/20 for 90DS     Consultations: Cardiology   Procedures/Studies: DG Chest 2 View  Result Date: 09/13/2020 CLINICAL DATA:  Epigastric pain. EXAM: CHEST - 2 VIEW COMPARISON:  05/09/2014 FINDINGS: Heart size and mediastinal contours appear normal. Aortic atherosclerotic calcifications. No pleural effusion or edema. No airspace densities. No acute osseous findings. IMPRESSION: No acute cardiopulmonary abnormalities. Electronically Signed   By: Kerby Moors M.D.   On: 09/13/2020 11:54   ECHOCARDIOGRAM COMPLETE  Result Date: 09/14/2020    ECHOCARDIOGRAM REPORT   Patient Name:   Robert Clayton Date of Exam: 09/14/2020 Medical Rec #:  [id number]    Height:       71.5 in Accession #:    [id number]   Weight:       193.1 lb Date of Birth:  January 13, 1930   BSA:          2.088 m Patient Age:    28 years     BP:           141/71 mmHg Patient Gender: M            HR:           55 bpm. Exam Location:  Inpatient Procedure: 2D Echo, Cardiac Doppler, Color Doppler and Intracardiac            Opacification Agent Indications:    Chest Pain R07.9  History:        Patient has prior history of Echocardiogram examinations, most                 recent 12/26/2017. CHF, CAD, PAD; Risk Factors:Hypertension and                 Dyslipidemia. Chronic kidney disease. COVID positive.  Sonographer:    Darlina Sicilian RDCS Referring Phys: Marvin  1. Left ventricular ejection fraction, by estimation, is 65 to 70%. The left ventricle has normal function. The left ventricle has no regional wall motion abnormalities. There is severe asymmetric left ventricular hypertrophy of the basal-septal segment. LVOT peak  gradient measuring up to 79mmHg  2. Right ventricular systolic function is normal. The right ventricular size is normal. Tricuspid regurgitation signal is inadequate for assessing PA pressure.  3. The mitral valve is abnormal. Trivial mitral valve regurgitation. No  evidence of mitral stenosis. Moderate mitral annular calcification.  4. The aortic valve was not well visualized. Aortic valve regurgitation is trivial. Mild to moderate aortic valve sclerosis/calcification is present, without any evidence of aortic stenosis.  5. The inferior vena cava is normal in size with greater than 50% respiratory variability, suggesting right atrial pressure of 3 mmHg. FINDINGS  Left Ventricle: Left ventricular ejection fraction, by estimation, is 65 to 70%. The left ventricle has normal function. The left ventricle has no regional wall motion abnormalities. Definity contrast agent was given IV to delineate the left ventricular  endocardial borders. The left ventricular internal cavity size was normal in size. There is severe asymmetric left ventricular hypertrophy of the basal-septal segment. Left ventricular diastolic parameters are indeterminate. Right Ventricle: The right ventricular size is normal. No increase in right ventricular wall thickness. Right ventricular systolic function is normal. Tricuspid regurgitation signal is inadequate for assessing PA pressure. The tricuspid regurgitant velocity is 1.35 m/s, and with an assumed right atrial pressure of 3 mmHg, the estimated right ventricular systolic pressure is 33.2 mmHg. Left Atrium: Left atrial size was normal in size. Right Atrium: Right atrial size was normal in size. Pericardium: There is no evidence of pericardial effusion. Mitral Valve: The mitral valve is abnormal. Moderate mitral annular calcification. Trivial mitral valve regurgitation. No evidence of mitral valve stenosis. Tricuspid Valve: The tricuspid valve is normal in structure. Tricuspid valve regurgitation is trivial. Aortic Valve: The aortic valve was not well visualized. Aortic valve regurgitation is trivial. Mild to moderate aortic valve sclerosis/calcification is present, without any evidence of aortic stenosis. Pulmonic Valve: The pulmonic valve was not well  visualized. Pulmonic valve regurgitation is mild. Aorta: The aortic root and ascending aorta are structurally normal, with no evidence of dilitation. Venous: The inferior vena cava is normal in size with greater than 50% respiratory variability, suggesting right atrial pressure of 3 mmHg. IAS/Shunts: The interatrial septum was not well visualized.  LEFT VENTRICLE PLAX 2D LVIDd:         3.80 cm  Diastology LVIDs:         2.20 cm  LV e' medial:    4.59 cm/s LV PW:         1.40 cm  LV E/e' medial:  18.8 LV IVS:        1.60 cm  LV e' lateral:   5.87 cm/s LVOT diam:     2.05 cm  LV E/e' lateral: 14.7 LV SV:         136 LV SV Index:   65 LVOT Area:     3.30 cm  RIGHT VENTRICLE RV S prime:     11.40 cm/s TAPSE (M-mode): 1.6 cm LEFT ATRIUM             Index       RIGHT ATRIUM           Index LA diam:        3.30 cm 1.58 cm/m  RA Area:     11.90 cm LA Vol (A2C):   45.0 ml 21.55 ml/m RA Volume:   26.20 ml  12.55 ml/m LA Vol (A4C):   46.9 ml 22.46 ml/m LA Biplane Vol: 47.2 ml 22.60 ml/m  AORTIC VALVE  PULMONIC VALVE LVOT Vmax:   192.00 cm/s  PV Vmax:          1.83 m/s LVOT Vmean:  104.000 cm/s PV Peak grad:     13.4 mmHg LVOT VTI:    0.411 m      PR End Diast Vel: 4.49 msec  AORTA Ao Root diam: 3.80 cm Ao Asc diam:  3.50 cm MITRAL VALVE                TRICUSPID VALVE MV Area (PHT): 2.45 cm     TR Peak grad:   7.3 mmHg MV Decel Time: 310 msec     TR Vmax:        135.00 cm/s MV E velocity: 86.20 cm/s MV A velocity: 102.00 cm/s  SHUNTS MV E/A ratio:  0.85         Systemic VTI:  0.41 m                             Systemic Diam: 2.05 cm Oswaldo Milian MD Electronically signed by Oswaldo Milian MD Signature Date/Time: 09/14/2020/5:49:39 PM    Final    CT RENAL STONE STUDY  Result Date: 09/14/2020 CLINICAL DATA:  Left flank pain. EXAM: CT ABDOMEN AND PELVIS WITHOUT CONTRAST TECHNIQUE: Multidetector CT imaging of the abdomen and pelvis was performed following the standard protocol without IV contrast.  COMPARISON:  CT scan 04/30/2013 FINDINGS: Lower chest: Streaky bibasilar atelectasis. No infiltrates effusions. The heart is within normal limits in size for age. No pericardial effusion. Aortic calcifications noted. Hepatobiliary: No hepatic lesions are identified without contrast. No intrahepatic biliary dilatation. The gallbladder is surgically absent. No common bile duct dilatation. Pancreas: Advanced fatty atrophy of the pancreas but no mass or inflammation. Spleen: Normal size. No focal lesions. Adrenals/Urinary Tract: Adrenal glands are normal. Simple right renal cyst. No renal or obstructing ureteral calculi are identified. No worrisome renal lesions without contrast. Bladder is grossly normal without contrast. Stomach/Bowel: The stomach, duodenum, small bowel and colon are grossly normal without oral contrast. No acute inflammatory process, mass lesions or obstructive findings. Sigmoid colon diverticulosis is noted. The terminal ileum appendix are. Vascular/Lymphatic: Advanced atherosclerotic calcifications involving the aorta and branch vessel ostia. No aneurysm. No mesenteric or retroperitoneal mass or adenopathy. Stable scattered IVC calcifications possibly related to chronic calcified clot. Reproductive: Brachytherapy seeds are noted throughout the prostate gland. The seminal vesicles are unremarkable. Other: No pelvic mass or adenopathy. No free pelvic fluid collections. No inguinal mass or adenopathy. No abdominal wall hernia or subcutaneous lesions. Musculoskeletal: No significant bony findings. No fractures or bone lesions. IMPRESSION: 1. No acute abdominal/pelvic findings, mass lesions or adenopathy. 2. No renal, ureteral or bladder calculi or mass. 3. Status post cholecystectomy. No biliary dilatation. 4. Advanced atherosclerotic calcifications involving the aorta and branch vessels. 5. Brachytherapy seeds throughout the prostate gland. Aortic Atherosclerosis (ICD10-I70.0). Electronically Signed    By: Marijo Sanes M.D.   On: 09/14/2020 05:50     Subjective: Seen and examined at bedside and he was doing well.  Had no complaints of shortness of breath.  Epigastric pain and chest pain resolved.  No other concerns or complaints this time and he is stable to be discharged home and follow-up with PCP, cardiology and gastroenterology in outpatient setting  Discharge Exam: Vitals:   09/14/20 1300 09/14/20 1315  BP: 131/60 134/81  Pulse: 64 64  Resp: (!) 6 12  Temp:    SpO2:  99% 98%   Vitals:   09/14/20 1230 09/14/20 1245 09/14/20 1300 09/14/20 1315  BP: 123/61 134/73 131/60 134/81  Pulse: 67 67 64 64  Resp: 19 11 (!) 6 12  Temp:      TempSrc:      SpO2: 99% 99% 99% 98%  Weight:      Height:       General: Pt is alert, awake, not in acute distress Cardiovascular: RRR, S1/S2 +, no rubs, no gallops Respiratory: Diminished bilaterally, no wheezing, no rhonchi; unlabored breathing Abdominal: Soft, NT, mildly distended second body habitus, bowel sounds + Extremities: no edema, no cyanosis  The results of significant diagnostics from this hospitalization (including imaging, microbiology, ancillary and laboratory) are listed below for reference.    Microbiology: Recent Results (from the past 240 hour(s))  Resp Panel by RT-PCR (Flu A&B, Covid) Nasopharyngeal Swab     Status: Abnormal   Collection Time: 09/13/20  7:26 PM   Specimen: Nasopharyngeal Swab; Nasopharyngeal(NP) swabs in vial transport medium  Result Value Ref Range Status   SARS Coronavirus 2 by RT PCR POSITIVE (A) NEGATIVE Final    Comment: RESULT CALLED TO, READ BACK BY AND VERIFIED WITH: GRACE TATE 09/13/20 @2108  BY JW (NOTE) SARS-CoV-2 target nucleic acids are DETECTED.  The SARS-CoV-2 RNA is generally detectable in upper respiratory specimens during the acute phase of infection. Positive results are indicative of the presence of the identified virus, but do not rule out bacterial infection or co-infection with  other pathogens not detected by the test. Clinical correlation with patient history and other diagnostic information is necessary to determine patient infection status. The expected result is Negative.  Fact Sheet for Patients: EntrepreneurPulse.com.au  Fact Sheet for Healthcare Providers: IncredibleEmployment.be  This test is not yet approved or cleared by the [place] FDA and  has been authorized for detection and/or diagnosis of SARS-CoV-2 by FDA under an Emergency Use Authorization (EUA).  This EUA will remain in effect (meaning this test can be use d) for the duration of  the COVID-19 declaration under Section 564(b)(1) of the Act, 21 U.S.C. section 360bbb-3(b)(1), unless the authorization is terminated or revoked sooner.     Influenza A by PCR NEGATIVE NEGATIVE Final   Influenza B by PCR NEGATIVE NEGATIVE Final    Comment: (NOTE) The Xpert Xpress SARS-CoV-2/FLU/RSV plus assay is intended as an aid in the diagnosis of influenza from Nasopharyngeal swab specimens and should not be used as a sole basis for treatment. Nasal washings and aspirates are unacceptable for Xpert Xpress SARS-CoV-2/FLU/RSV testing.  Fact Sheet for Patients: EntrepreneurPulse.com.au  Fact Sheet for Healthcare Providers: IncredibleEmployment.be  This test is not yet approved or cleared by the [place] FDA and has been authorized for detection and/or diagnosis of SARS-CoV-2 by FDA under an Emergency Use Authorization (EUA). This EUA will remain in effect (meaning this test can be used) for the duration of the COVID-19 declaration under Section 564(b)(1) of the Act, 21 U.S.C. section 360bbb-3(b)(1), unless the authorization is terminated or revoked.  Performed at [facility], [place] [address], [place], [place] [address]   MRSA Next Gen by PCR, Nasal     Status: None   Collection Time: 09/13/20 11:28 PM    Specimen: Nasal Mucosa; Nasal Swab  Result Value Ref Range Status   MRSA by PCR Next Gen NOT DETECTED NOT DETECTED Final    Comment: (NOTE) The GeneXpert MRSA Assay (FDA approved for NASAL specimens only), is one component of a  comprehensive MRSA colonization surveillance program. It is not intended to diagnose MRSA infection nor to guide or monitor treatment for MRSA infections. Test performance is not FDA approved in patients less than 53 years old. Performed at [facility], [place] [address] [place], [place] [address]     Labs: BNP (last 3 results) No results for input(s): BNP in the last 8760 hours. Basic Metabolic Panel: Recent Labs  Lab 09/13/20 1104 09/14/20 0357  NA 142 144  K 4.8 4.6  CL 113* 113*  CO2 24 22  GLUCOSE 86 96  BUN 28* 25*  CREATININE 2.44* 2.25*  CALCIUM 9.1 9.3   Liver Function Tests: Recent Labs  Lab 09/14/20 0357  AST 14*  ALT 9  ALKPHOS 69  BILITOT 0.8  PROT 5.9*  ALBUMIN 3.1*   Recent Labs  Lab 09/14/20 0357  LIPASE 22   No results for input(s): AMMONIA in the last 168 hours. CBC: Recent Labs  Lab 09/13/20 1104 09/14/20 0357  WBC 7.9 8.3  HGB 11.4* 11.4*  HCT 36.2* 35.7*  MCV 98.9 95.7  PLT 77* 72*   Cardiac Enzymes: No results for input(s): CKTOTAL, CKMB, CKMBINDEX, TROPONINI in the last 168 hours. BNP: Invalid input(s): POCBNP CBG: Recent Labs  Lab 09/14/20 0358 09/14/20 0806 09/14/20 1004 09/14/20 1133 09/14/20 1604  GLUCAP 95 53* 127* 154* 89   D-Dimer Recent Labs    09/14/20 0357  DDIMER 1.20*   Hgb A1c No results for input(s): HGBA1C in the last 72 hours. Lipid Profile No results for input(s): CHOL, HDL, LDLCALC, TRIG, CHOLHDL, LDLDIRECT in the last 72 hours. Thyroid function studies No results for input(s): TSH, T4TOTAL, T3FREE, THYROIDAB in the last 72 hours.  Invalid input(s): FREET3 Anemia work up No results for input(s): VITAMINB12, FOLATE, FERRITIN, TIBC, IRON, RETICCTPCT in the last 72  hours. Urinalysis    Component Value Date/Time   COLORURINE YELLOW 02/11/2020 1448   APPEARANCEUR CLEAR 02/11/2020 1448   LABSPEC 1.010 02/11/2020 1448   PHURINE 5.5 02/11/2020 1448   GLUCOSEU NEGATIVE 02/11/2020 1448   HGBUR NEGATIVE 02/11/2020 1448   HGBUR trace-intact 11/23/2008 1421   BILIRUBINUR n 06/26/2020 1404   KETONESUR NEGATIVE 02/11/2020 1448   PROTEINUR Positive (A) 06/26/2020 1404   PROTEINUR NEGATIVE 09/23/2019 1503   UROBILINOGEN 0.2 06/26/2020 1404   UROBILINOGEN 0.2 02/11/2020 1448   NITRITE positive 06/26/2020 1404   NITRITE NEGATIVE 02/11/2020 1448   LEUKOCYTESUR Large (3+) (A) 06/26/2020 1404   LEUKOCYTESUR NEGATIVE 02/11/2020 1448   Sepsis Labs Invalid input(s): PROCALCITONIN,  WBC,  LACTICIDVEN Microbiology Recent Results (from the past 240 hour(s))  Resp Panel by RT-PCR (Flu A&B, Covid) Nasopharyngeal Swab     Status: Abnormal   Collection Time: 09/13/20  7:26 PM   Specimen: Nasopharyngeal Swab; Nasopharyngeal(NP) swabs in vial transport medium  Result Value Ref Range Status   SARS Coronavirus 2 by RT PCR POSITIVE (A) NEGATIVE Final    Comment: RESULT CALLED TO, READ BACK BY AND VERIFIED WITH: GRACE TATE 09/13/20 @2108  BY JW (NOTE) SARS-CoV-2 target nucleic acids are DETECTED.  The SARS-CoV-2 RNA is generally detectable in upper respiratory specimens during the acute phase of infection. Positive results are indicative of the presence of the identified virus, but do not rule out bacterial infection or co-infection with other pathogens not detected by the test. Clinical correlation with patient history and other diagnostic information is necessary to determine patient infection status. The expected result is Negative.  Fact Sheet for Patients: EntrepreneurPulse.com.au  Fact Sheet for Healthcare Providers: IncredibleEmployment.be  This test is not yet approved or cleared by the [place] FDA and  has been  authorized for detection and/or diagnosis of SARS-CoV-2 by FDA under an Emergency Use Authorization (EUA).  This EUA will remain in effect (meaning this test can be use d) for the duration of  the COVID-19 declaration under Section 564(b)(1) of the Act, 21 U.S.C. section 360bbb-3(b)(1), unless the authorization is terminated or revoked sooner.     Influenza A by PCR NEGATIVE NEGATIVE Final   Influenza B by PCR NEGATIVE NEGATIVE Final    Comment: (NOTE) The Xpert Xpress SARS-CoV-2/FLU/RSV plus assay is intended as an aid in the diagnosis of influenza from Nasopharyngeal swab specimens and should not be used as a sole basis for treatment. Nasal washings and aspirates are unacceptable for Xpert Xpress SARS-CoV-2/FLU/RSV testing.  Fact Sheet for Patients: EntrepreneurPulse.com.au  Fact Sheet for Healthcare Providers: IncredibleEmployment.be  This test is not yet approved or cleared by the [place] FDA and has been authorized for detection and/or diagnosis of SARS-CoV-2 by FDA under an Emergency Use Authorization (EUA). This EUA will remain in effect (meaning this test can be used) for the duration of the COVID-19 declaration under Section 564(b)(1) of the Act, 21 U.S.C. section 360bbb-3(b)(1), unless the authorization is terminated or revoked.  Performed at [facility], [place] [address], [place], [place] [address]   MRSA Next Gen by PCR, Nasal     Status: None   Collection Time: 09/13/20 11:28 PM   Specimen: Nasal Mucosa; Nasal Swab  Result Value Ref Range Status   MRSA by PCR Next Gen NOT DETECTED NOT DETECTED Final    Comment: (NOTE) The GeneXpert MRSA Assay (FDA approved for NASAL specimens only), is one component of a comprehensive MRSA colonization surveillance program. It is not intended to diagnose MRSA infection nor to guide or monitor treatment for MRSA infections. Test performance is not FDA approved in patients less than  20 years old. Performed at [facility], [place] [address], [place], [place] [address]    Time coordinating discharge: 35 minutes  SIGNED:  Kerney Elbe, DO [facility] 09/14/2020, 7:16 PM Pager is on [place]  If 7PM-7AM, please contact night-coverage [email or web address]

## 2020-09-14 NOTE — Evaluation (Signed)
Physical Therapy Evaluation Patient Details Name: Robert Clayton MRN: [id number] DOB: Sep 29, 1930 Today's Date: 09/14/2020   History of Present Illness  Pt is 87 yo male admitted on 09/13/20 with chest pain/epigastric pain.  Pt cleared by cardiology for d/c.  Pt with hx including CAD s/p stenting, kidney disease stage IV, anemia, HTN, and DM2  Clinical Impression  Pt admitted with above diagnosis.  At baseline, he is independent with ADLs, IADLs, and ambulates with a cane in community and holds furniture if needed at home.  He was able to demonstrate transfers safely and easily.  Ambulated 150' without AD , was steady, normal gait.  All VSS during therapy.  Pt appears to be at baseline.  No further acute PT needs.      Follow Up Recommendations No PT follow up    Equipment Recommendations  None recommended by PT    Recommendations for Other Services       Precautions / Restrictions Precautions Precautions: Fall      Mobility  Bed Mobility Overal bed mobility: Independent Bed Mobility: Supine to Sit;Sit to Supine     Supine to sit: Independent Sit to supine: Independent   General bed mobility comments: Has been performing independently; demonstrated safely during therapy    Transfers Overall transfer level: Needs assistance Equipment used: None Transfers: Sit to/from Stand Sit to Stand: Supervision         General transfer comment: Performed safely  Ambulation/Gait Ambulation/Gait assistance: Supervision Gait Distance (Feet): 150 Feet Assistive device: None Gait Pattern/deviations: Step-through pattern;Decreased stride length Gait velocity: normal   General Gait Details: Limited to ambulation in room due to COVID.  Made several laps with turns and navigating around objects in room.  Steady gait pattern  Stairs            Wheelchair Mobility    Modified Rankin (Stroke Patients Only)       Balance Overall balance assessment: Independent Sitting-balance  support: No upper extremity supported Sitting balance-Leahy Scale: Normal     Standing balance support: No upper extremity supported Standing balance-Leahy Scale: Good                               Pertinent Vitals/Pain Pain Assessment: No/denies pain    Home Living Family/patient expects to be discharged to:: Private residence Living Arrangements: Spouse/significant other Available Help at Discharge: Family;Available 24 hours/day Type of Home: House Home Access: Level entry     Home Layout: One level Home Equipment: Cane - single point;Walker - 2 wheels      Prior Function Level of Independence: Independent with assistive device(s)         Comments: Pt indepemdent with ADLs, IADLs, community ambuolation, and driving.  Uses a cane in community and "furniture" in home. Denies falls     Hand Dominance        Extremity/Trunk Assessment   Upper Extremity Assessment Upper Extremity Assessment: Overall WFL for tasks assessed    Lower Extremity Assessment Lower Extremity Assessment: Overall WFL for tasks assessed    Cervical / Trunk Assessment Cervical / Trunk Assessment: Normal  Communication   Communication: HOH  Cognition Arousal/Alertness: Awake/alert Behavior During Therapy: WFL for tasks assessed/performed Overall Cognitive Status: Within Functional Limits for tasks assessed  General Comments General comments (skin integrity, edema, etc.): HR 57 bpm rest and 70's with activity; O2 sats 100% on RA and BP stable    Exercises     Assessment/Plan    PT Assessment Patent does not need any further PT services  PT Problem List         PT Treatment Interventions      PT Goals (Current goals can be found in the Care Plan section)  Acute Rehab PT Goals Patient Stated Goal: return home PT Goal Formulation: With patient Time For Goal Achievement: 09/28/20 Potential to Achieve Goals:  Good    Frequency     Barriers to discharge        Co-evaluation               AM-PAC PT "6 Clicks" Mobility  Outcome Measure Help needed turning from your back to your side while in a flat bed without using bedrails?: None Help needed moving from lying on your back to sitting on the side of a flat bed without using bedrails?: None Help needed moving to and from a bed to a chair (including a wheelchair)?: None Help needed standing up from a chair using your arms (e.g., wheelchair or bedside chair)?: None Help needed to walk in hospital room?: None Help needed climbing 3-5 steps with a railing? : A Little 6 Click Score: 23    End of Session   Activity Tolerance: Patient tolerated treatment well Patient left: in bed;with call bell/phone within reach (no alarm as pt has been standing to use urnial per RN) Nurse Communication: Mobility status      Time: 0177-9390 PT Time Calculation (min) (ACUTE ONLY): 20 min   Charges:   PT Evaluation $PT Eval Low Complexity: 1 Low          Dat Derksen, PT Acute Rehab Services Pager [phone] [facility] [phone]   Karlton Lemon 09/14/2020, 2:32 PM
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
## 2020-09-14 NOTE — Progress Notes (Signed)
OT Cancellation Note  Patient Details Name: Robert Clayton MRN: [id number] DOB: 06/25/1930   Cancelled Treatment:    Reason Eval/Treat Not Completed: OT screened, no needs identified, will sign off  [name] 09/14/2020, 2:59 PM [name], OTR/L Acute Rehabilitation Services Pager: [phone] Office: [phone]

## 2020-09-14 NOTE — Progress Notes (Signed)
Progress Note  Patient Name: Robert Clayton Date of Encounter: 09/14/2020  [facility] Cardiologist: Dr Burt Knack  Subjective   Mild epigastric pain; no dyspnea  Inpatient Medications    Scheduled Meds:  allopurinol  300 mg Oral Daily   aspirin EC  81 mg Oral Daily   atorvastatin  10 mg Oral Daily   insulin aspart  0-6 Units Subcutaneous Q4H   irbesartan  300 mg Oral Daily   isosorbide mononitrate  30 mg Oral Daily   metoprolol tartrate  50 mg Oral BID   pantoprazole  40 mg Oral Daily   Continuous Infusions:  heparin 1,100 Units/hr (09/13/20 2022)   PRN Meds: acetaminophen **OR** acetaminophen, hydrALAZINE, nitroGLYCERIN   Vital Signs    Vitals:   09/14/20 0200 09/14/20 0230 09/14/20 0300 09/14/20 0330  BP: 133/78 131/77 126/74 127/63  Pulse: 70 68 68 60  Resp: 18 10 16 18   Temp:    98.3 F (36.8 C)  TempSrc:    Oral  SpO2: 97% 97% 93% 94%  Weight:      Height:        Intake/Output Summary (Last 24 hours) at 09/14/2020 0739 Last data filed at 09/14/2020 0403 Gross per 24 hour  Intake 72.82 ml  Output 325 ml  Net -252.18 ml   Last 3 Weights 09/13/2020 09/13/2020 08/10/2020  Weight (lbs) 193 lb 2 oz 190 lb 189 lb  Weight (kg) 87.6 kg 86.183 kg 85.73 kg      Telemetry    Sinus - Personally Reviewed   Physical Exam   GEN: No acute distress.   Neck: No JVD Cardiac: RRR, no murmurs, rubs, or gallops.  Respiratory: Clear to auscultation bilaterally. GI: Soft, nontender, non-distended  MS: No edema Neuro:  Nonfocal  Psych: Normal affect   Labs    High Sensitivity Troponin:   Recent Labs  Lab 09/13/20 1104 09/13/20 1815  TROPONINIHS 53* 40*      Chemistry Recent Labs  Lab 09/13/20 1104 09/14/20 0357  NA 142 144  K 4.8 4.6  CL 113* 113*  CO2 24 22  GLUCOSE 86 96  BUN 28* 25*  CREATININE 2.44* 2.25*  CALCIUM 9.1 9.3  PROT  --  5.9*  ALBUMIN  --  3.1*  AST  --  14*  ALT  --  9  ALKPHOS  --  69  BILITOT  --  0.8  GFRNONAA 25* 28*   ANIONGAP 5 9     Hematology Recent Labs  Lab 09/13/20 1104 09/14/20 0357  WBC 7.9 8.3  RBC 3.66* 3.73*  HGB 11.4* 11.4*  HCT 36.2* 35.7*  MCV 98.9 95.7  MCH 31.1 30.6  MCHC 31.5 31.9  RDW 16.9* 16.6*  PLT 77* 72*    DDimer  Recent Labs  Lab 09/14/20 0357  DDIMER 1.20*     Radiology    DG Chest 2 View  Result Date: 09/13/2020 CLINICAL DATA:  Epigastric pain. EXAM: CHEST - 2 VIEW COMPARISON:  05/09/2014 FINDINGS: Heart size and mediastinal contours appear normal. Aortic atherosclerotic calcifications. No pleural effusion or edema. No airspace densities. No acute osseous findings. IMPRESSION: No acute cardiopulmonary abnormalities. Electronically Signed   By: Kerby Moors M.D.   On: 09/13/2020 11:54   CT RENAL STONE STUDY  Result Date: 09/14/2020 CLINICAL DATA:  Left flank pain. EXAM: CT ABDOMEN AND PELVIS WITHOUT CONTRAST TECHNIQUE: Multidetector CT imaging of the abdomen and pelvis was performed following the standard protocol without IV contrast. COMPARISON:  CT scan  04/30/2013 FINDINGS: Lower chest: Streaky bibasilar atelectasis. No infiltrates effusions. The heart is within normal limits in size for age. No pericardial effusion. Aortic calcifications noted. Hepatobiliary: No hepatic lesions are identified without contrast. No intrahepatic biliary dilatation. The gallbladder is surgically absent. No common bile duct dilatation. Pancreas: Advanced fatty atrophy of the pancreas but no mass or inflammation. Spleen: Normal size. No focal lesions. Adrenals/Urinary Tract: Adrenal glands are normal. Simple right renal cyst. No renal or obstructing ureteral calculi are identified. No worrisome renal lesions without contrast. Bladder is grossly normal without contrast. Stomach/Bowel: The stomach, duodenum, small bowel and colon are grossly normal without oral contrast. No acute inflammatory process, mass lesions or obstructive findings. Sigmoid colon diverticulosis is noted. The terminal  ileum appendix are. Vascular/Lymphatic: Advanced atherosclerotic calcifications involving the aorta and branch vessel ostia. No aneurysm. No mesenteric or retroperitoneal mass or adenopathy. Stable scattered IVC calcifications possibly related to chronic calcified clot. Reproductive: Brachytherapy seeds are noted throughout the prostate gland. The seminal vesicles are unremarkable. Other: No pelvic mass or adenopathy. No free pelvic fluid collections. No inguinal mass or adenopathy. No abdominal wall hernia or subcutaneous lesions. Musculoskeletal: No significant bony findings. No fractures or bone lesions. IMPRESSION: 1. No acute abdominal/pelvic findings, mass lesions or adenopathy. 2. No renal, ureteral or bladder calculi or mass. 3. Status post cholecystectomy. No biliary dilatation. 4. Advanced atherosclerotic calcifications involving the aorta and branch vessels. 5. Brachytherapy seeds throughout the prostate gland. Aortic Atherosclerosis (ICD10-I70.0). Electronically Signed   By: Marijo Sanes M.D.   On: 09/14/2020 05:50    Patient Profile     87 y.o. male with past medical history of coronary artery disease, diabetes mellitus, hypertension, peripheral vascular disease, chronic stage IV kidney disease, chronic diastolic congestive heart failure being evaluated for abdominal pain/chest pain.  Assessment & Plan    1 chest pain-epigastric pain present for greater than 24 hours now with no clear trend in troponins and therefore not consistent with acute coronary syndrome.  Patient with significant renal insufficiency and would be at risk for contrast nephropathy if catheterization pursued in the future.  I would therefore plan medical therapy.  Further evaluation of epigastric pain per primary service.  Follow-up echocardiogram pending.  If LV function remains normal patient could be discharged from a cardiac standpoint and follow-up with Dr. Burt Knack in approximately 3 months.  2 hypertension-blood  pressure much improved this morning.  Continue present medications and follow.  3 coronary artery disease-continue aspirin and statin.  4 chronic stage IV kidney disease-creatinine unchanged this morning.  ARB has been resumed.  Will need close follow-up of renal function after discharge.  5 COVID positive-Per primary care.  For questions or updates, please contact [place] Please consult [email or web address] for contact info under        Signed, Kirk Ruths, MD  09/14/2020, 7:39 AM

## 2020-09-16 MED ORDER — PERFLUTREN LIPID MICROSPHERE
1.0000 mL | INTRAVENOUS | Status: AC | PRN
  Administered 2020-09-14: 2 mL via INTRAVENOUS

## 2020-09-18 ENCOUNTER — Telehealth: Payer: Self-pay | Admitting: Nurse Practitioner

## 2020-09-18 NOTE — Telephone Encounter (Signed)
Patient has been scheduled with Nicoletta Ba on 10/25 (first available) at 230pm. Address to office and phone number provided to patient and his spouse.

## 2020-09-18 NOTE — Telephone Encounter (Signed)
Lauren, I received a phone call from hospitalist Dr. Alfredia Ferguson on 9/8 same day patient was discharged from the hospital. Patient was not seen by our GI service during this admission but Dr. Alfredia Ferguson asked if our office could contact this patient to schedule a GI follow up appointment in 2 weeks due to epigastric pain. Patient was seen by Nicoletta Ba PA-C in 2016 and Dr. Havery Moros was assigned GI physician. Please call the patient and schedule him with Amy, Dr. Havery Moros or any APP who has an appointment available. Thank you.

## 2020-09-21 ENCOUNTER — Other Ambulatory Visit: Payer: Self-pay | Admitting: Internal Medicine

## 2020-09-23 ENCOUNTER — Other Ambulatory Visit: Payer: Self-pay | Admitting: Internal Medicine

## 2020-09-23 DIAGNOSIS — I2510 Atherosclerotic heart disease of native coronary artery without angina pectoris: Secondary | ICD-10-CM

## 2020-09-23 DIAGNOSIS — E1151 Type 2 diabetes mellitus with diabetic peripheral angiopathy without gangrene: Secondary | ICD-10-CM

## 2020-09-23 DIAGNOSIS — E785 Hyperlipidemia, unspecified: Secondary | ICD-10-CM

## 2020-09-23 DIAGNOSIS — IMO0002 Reserved for concepts with insufficient information to code with codable children: Secondary | ICD-10-CM

## 2020-10-11 ENCOUNTER — Other Ambulatory Visit: Payer: Self-pay

## 2020-10-11 ENCOUNTER — Encounter: Payer: Self-pay | Admitting: Podiatry

## 2020-10-11 ENCOUNTER — Ambulatory Visit (INDEPENDENT_AMBULATORY_CARE_PROVIDER_SITE_OTHER): Payer: Medicare Other | Admitting: Podiatry

## 2020-10-11 DIAGNOSIS — B351 Tinea unguium: Secondary | ICD-10-CM

## 2020-10-11 DIAGNOSIS — N183 Chronic kidney disease, stage 3 unspecified: Secondary | ICD-10-CM | POA: Diagnosis not present

## 2020-10-11 DIAGNOSIS — M79675 Pain in left toe(s): Secondary | ICD-10-CM | POA: Diagnosis not present

## 2020-10-11 DIAGNOSIS — M79674 Pain in right toe(s): Secondary | ICD-10-CM | POA: Diagnosis not present

## 2020-10-11 DIAGNOSIS — I872 Venous insufficiency (chronic) (peripheral): Secondary | ICD-10-CM

## 2020-10-11 NOTE — Progress Notes (Signed)
This patient returns to my office for at risk foot care.  This patient requires this care by a professional since this patient will be at risk due to having diabetic mellitus with pvd and diabetic neuropathy and venous insufficiency and CKD.  This patient is unable to cut nails himself since the patient cannot reach his nails.These nails are painful walking and wearing shoes.  This patient presents for at risk foot care today.  General Appearance  Alert, conversant and in no acute stress.  Vascular  Dorsalis pedis and posterior tibial  pulses are weakly  palpable  bilaterally.  Capillary return is within normal limits  bilaterally. Temperature is within normal limits  bilaterally.  Neurologic  Senn-Weinstein monofilament wire test diminished bilaterally. Muscle power within normal limits bilaterally.  Nails Thick disfigured discolored nails with subungual debris  from hallux to fifth toes bilaterally. No evidence of bacterial infection or drainage bilaterally.  Orthopedic  No limitations of motion  feet .  No crepitus or effusions noted. HAV  B/L.  Plantar flexed fifth metatarsal right foot.  Skin  normotropic skin with  noted bilaterally.  No signs of infections or ulcers noted.   Healing blister on plantar aspect right hallux.  Onychomycosis  Pain in right toes  Pain in left toes.  Porokeratosis right foot.  Consent was obtained for treatment procedures.   Mechanical debridement of nails 1-5  bilaterally performed with a nail nipper.  Filed with dremel without incident.   Return office visit    3 mos.                 Told patient to return for periodic foot care and evaluation due to potential at risk complications.   Gardiner Barefoot DPM

## 2020-10-20 ENCOUNTER — Other Ambulatory Visit: Payer: Self-pay

## 2020-10-20 ENCOUNTER — Telehealth: Payer: Self-pay | Admitting: Internal Medicine

## 2020-10-20 MED ORDER — "BD INSULIN SYRINGE U/F 30G X 1/2"" 1 ML MISC": 3 refills | Status: DC

## 2020-10-20 NOTE — Telephone Encounter (Signed)
1.Medication Requested: B-D INS SYR ULTRAFINE 1CC/30G 30G X 1/2" 1 ML MISC  2. Pharmacy (Name, [place]): [id number] - [place], [place] - [place]  Phone:  [phone] Fax:  [phone]   3. On Med List: yes  4. Last Visit with PCP: 08.04.22  5. Next visit date with PCP: 10.24.22   Agent: Please be advised that RX refills may take up to 3 business days. We ask that you follow-up with your pharmacy.

## 2020-10-20 NOTE — Telephone Encounter (Signed)
Rx has been sent  

## 2020-10-30 ENCOUNTER — Ambulatory Visit (INDEPENDENT_AMBULATORY_CARE_PROVIDER_SITE_OTHER): Payer: Medicare Other | Admitting: Internal Medicine

## 2020-10-30 ENCOUNTER — Other Ambulatory Visit: Payer: Self-pay

## 2020-10-30 ENCOUNTER — Encounter: Payer: Self-pay | Admitting: Internal Medicine

## 2020-10-30 VITALS — BP 162/84 | HR 80 | Temp 98.6°F | Resp 16 | Ht 71.5 in | Wt 195.0 lb

## 2020-10-30 DIAGNOSIS — Z794 Long term (current) use of insulin: Secondary | ICD-10-CM

## 2020-10-30 DIAGNOSIS — E538 Deficiency of other specified B group vitamins: Secondary | ICD-10-CM | POA: Diagnosis not present

## 2020-10-30 DIAGNOSIS — Z23 Encounter for immunization: Secondary | ICD-10-CM

## 2020-10-30 DIAGNOSIS — E785 Hyperlipidemia, unspecified: Secondary | ICD-10-CM | POA: Diagnosis not present

## 2020-10-30 DIAGNOSIS — E119 Type 2 diabetes mellitus without complications: Secondary | ICD-10-CM | POA: Diagnosis not present

## 2020-10-30 DIAGNOSIS — Z0001 Encounter for general adult medical examination with abnormal findings: Secondary | ICD-10-CM

## 2020-10-30 DIAGNOSIS — D518 Other vitamin B12 deficiency anemias: Secondary | ICD-10-CM | POA: Diagnosis not present

## 2020-10-30 LAB — LIPID PANEL
Cholesterol: 121 mg/dL (ref 0–200)
HDL: 34.7 mg/dL — ABNORMAL LOW (ref >39.00)
LDL Cholesterol: 63 mg/dL (ref 0–99)
NonHDL: 86.21
Total CHOL/HDL Ratio: 3
Triglycerides: 115 mg/dL (ref 0.0–149.0)
VLDL: 23 mg/dL (ref 0.0–40.0)

## 2020-10-30 LAB — HEMOGLOBIN A1C: Hgb A1c MFr Bld: 6.9 % — ABNORMAL HIGH (ref 4.6–6.5)

## 2020-10-30 MED ORDER — CYANOCOBALAMIN 1000 MCG/ML IJ SOLN
1000.0000 ug | Freq: Once | INTRAMUSCULAR | Status: AC
  Administered 2020-10-30: 1000 ug via INTRAMUSCULAR

## 2020-10-30 MED ORDER — "BD INSULIN SYRINGE U/F 30G X 1/2"" 1 ML MISC": 3 refills | Status: AC

## 2020-10-30 NOTE — Patient Instructions (Signed)
Health Maintenance, Male Adopting a healthy lifestyle and getting preventive care are important in promoting health and wellness. Ask your health care provider about: The right schedule for you to have regular tests and exams. Things you can do on your own to prevent diseases and keep yourself healthy. What should I know about diet, weight, and exercise? Eat a healthy diet  Eat a diet that includes plenty of vegetables, fruits, low-fat dairy products, and lean protein. Do not eat a lot of foods that are high in solid fats, added sugars, or sodium. Maintain a healthy weight Body mass index (BMI) is a measurement that can be used to identify possible weight problems. It estimates body fat based on height and weight. Your health care provider can help determine your BMI and help you achieve or maintain a healthy weight. Get regular exercise Get regular exercise. This is one of the most important things you can do for your health. Most adults should: Exercise for at least 150 minutes each week. The exercise should increase your heart rate and make you sweat (moderate-intensity exercise). Do strengthening exercises at least twice a week. This is in addition to the moderate-intensity exercise. Spend less time sitting. Even light physical activity can be beneficial. Watch cholesterol and blood lipids Have your blood tested for lipids and cholesterol at 85 years of age, then have this test every 5 years. You may need to have your cholesterol levels checked more often if: Your lipid or cholesterol levels are high. You are older than 85 years of age. You are at high risk for heart disease. What should I know about cancer screening? Many types of cancers can be detected early and may often be prevented. Depending on your health history and family history, you may need to have cancer screening at various ages. This may include screening for: Colorectal cancer. Prostate cancer. Skin cancer. Lung  cancer. What should I know about heart disease, diabetes, and high blood pressure? Blood pressure and heart disease High blood pressure causes heart disease and increases the risk of stroke. This is more likely to develop in people who have high blood pressure readings, are of African descent, or are overweight. Talk with your health care provider about your target blood pressure readings. Have your blood pressure checked: Every 3-5 years if you are 18-39 years of age. Every year if you are 40 years old or older. If you are between the ages of 65 and 75 and are a current or former smoker, ask your health care provider if you should have a one-time screening for abdominal aortic aneurysm (AAA). Diabetes Have regular diabetes screenings. This checks your fasting blood sugar level. Have the screening done: Once every three years after age 45 if you are at a normal weight and have a low risk for diabetes. More often and at a younger age if you are overweight or have a high risk for diabetes. What should I know about preventing infection? Hepatitis B If you have a higher risk for hepatitis B, you should be screened for this virus. Talk with your health care provider to find out if you are at risk for hepatitis B infection. Hepatitis C Blood testing is recommended for: Everyone born from 1945 through 1965. Anyone with known risk factors for hepatitis C. Sexually transmitted infections (STIs) You should be screened each year for STIs, including gonorrhea and chlamydia, if: You are sexually active and are younger than 85 years of age. You are older than 85 years   of age and your health care provider tells you that you are at risk for this type of infection. Your sexual activity has changed since you were last screened, and you are at increased risk for chlamydia or gonorrhea. Ask your health care provider if you are at risk. Ask your health care provider about whether you are at high risk for HIV.  Your health care provider may recommend a prescription medicine to help prevent HIV infection. If you choose to take medicine to prevent HIV, you should first get tested for HIV. You should then be tested every 3 months for as long as you are taking the medicine. Follow these instructions at home: Lifestyle Do not use any products that contain nicotine or tobacco, such as cigarettes, e-cigarettes, and chewing tobacco. If you need help quitting, ask your health care provider. Do not use street drugs. Do not share needles. Ask your health care provider for help if you need support or information about quitting drugs. Alcohol use Do not drink alcohol if your health care provider tells you not to drink. If you drink alcohol: Limit how much you have to 0-2 drinks a day. Be aware of how much alcohol is in your drink. In the [place] one drink equals one 12 oz bottle of beer (355 mL), one 5 oz glass of wine (148 mL), or one 1 oz glass of hard liquor (44 mL). General instructions Schedule regular health, dental, and eye exams. Stay current with your vaccines. Tell your health care provider if: You often feel depressed. You have ever been abused or do not feel safe at home. Summary Adopting a healthy lifestyle and getting preventive care are important in promoting health and wellness. Follow your health care provider's instructions about healthy diet, exercising, and getting tested or screened for diseases. Follow your health care provider's instructions on monitoring your cholesterol and blood pressure. This information is not intended to replace advice given to you by your health care provider. Make sure you discuss any questions you have with your health care provider. Document Revised: 03/03/2020 Document Reviewed: 12/17/2017 Elsevier Patient Education  2022 Elsevier Inc.  

## 2020-10-30 NOTE — Progress Notes (Signed)
Subjective:  Patient ID: Robert Clayton, male    DOB: 12/06/1930  Age: 87 y.o. MRN: [id number]  CC: Annual Exam, Hypertension, Hyperlipidemia, and Diabetes  This visit occurred during the SARS-CoV-2 public health emergency.  Safety protocols were in place, including screening questions prior to the visit, additional usage of staff PPE, and extensive cleaning of exam room while observing appropriate contact time as indicated for disinfecting solutions.    HPI Ace Bergfeld presents for a CPX and f/up -   He has felt well recently and offers no complaints.  He tells me his blood pressure and blood sugars have been well controlled.  Outpatient Medications Prior to Visit  Medication Sig Dispense Refill   acetaminophen (TYLENOL) 325 MG tablet Take 650 mg by mouth every 6 (six) hours as needed for mild pain.     allopurinol (ZYLOPRIM) 300 MG tablet TAKE 1 TABLET BY MOUTH EVERY DAY 90 tablet 1   aspirin EC 81 MG tablet Take 81 mg by mouth daily.     atorvastatin (LIPITOR) 10 MG tablet TAKE 1 TABLET BY MOUTH EVERY DAY 90 tablet 1   Continuous Blood Gluc Receiver (FREESTYLE LIBRE 2 READER) DEVI 1 Act by Does not apply route daily. 2 each 5   Continuous Blood Gluc Sensor (FREESTYLE LIBRE 2 SENSOR) MISC 1 Act by Does not apply route daily. 2 each 5   Glucagon (GVOKE HYPOPEN 2-PACK) 1 MG/0.2ML SOAJ Inject 1 Act into the skin daily as needed. (Patient taking differently: Inject 1 Act into the skin daily as needed (hypoglycemia).) 2 mL 5   hydrochlorothiazide (HYDRODIURIL) 12.5 MG tablet Take 12.5 mg by mouth daily.     Insulin NPH, Human,, Isophane, (NOVOLIN N FLEXPEN) 100 UNIT/ML Kiwkpen Inject 5 Units into the skin 3 (three) times daily before meals. (Patient taking differently: Inject 30-80 Units into the skin See admin instructions. Inject 80 units SQ in the  am, then 30 units sq  in the afternoon per wife) 12 mL 2   isosorbide mononitrate (IMDUR) 30 MG 24 hr tablet TAKE 1 TABLET BY MOUTH EVERY DAY  (Patient taking differently: Take 30 mg by mouth daily.) 90 tablet 1   Lancets (ONETOUCH DELICA PLUS [id number]) MISC TEST 2 TIMES DAILY. DX: E11.51 100 each 0   metoprolol tartrate (LOPRESSOR) 50 MG tablet TAKE 1 TABLET TWICE A DAY (Patient taking differently: Take 50 mg by mouth 2 (two) times daily.) 180 tablet 1   nitroGLYCERIN (NITROSTAT) 0.4 MG SL tablet Place 1 tablet (0.4 mg total) under the tongue every 5 (five) minutes as needed. x3 doses as needed for chest pain (Patient taking differently: Place 0.4 mg under the tongue every 5 (five) minutes x 3 doses as needed for chest pain.) 30 tablet 0   omeprazole (PRILOSEC) 40 MG capsule TAKE 1 CAPSULE BY MOUTH EVERY DAY 90 capsule 1   ONETOUCH VERIO test strip USE TO TEST BLOOD SUGAR TWICE A DAY DX E11.51 200 strip 3   Suvorexant (BELSOMRA) 15 MG TABS Take 1 tablet by mouth at bedtime as needed. (Patient taking differently: Take 15 mg by mouth at bedtime as needed (sleep).) 90 tablet 1   telmisartan (MICARDIS) 80 MG tablet TAKE 1 TABLET BY MOUTH EVERY DAY (Patient taking differently: Take 80 mg by mouth daily.) 90 tablet 1   Insulin Syringe-Needle U-100 (B-D INS SYR ULTRAFINE 1CC/30G) 30G X 1/2" 1 ML MISC USE AS DIRECTED TWICE A DAY 200 each 3   Facility-Administered Medications Prior to Visit  Medication  Dose Route Frequency Provider Last Rate Last Admin   0.9 %  sodium chloride infusion   Intravenous PRN Sheikh, Omair Latif, DO       bebtelovimab EUA injection SOLN 175 mg  175 mg Intravenous Once Sheikh, Arco, DO        ROS Review of Systems  Constitutional:  Negative for diaphoresis and fatigue.  HENT: Negative.    Eyes: Negative.   Respiratory:  Negative for cough, chest tightness and wheezing.   Cardiovascular:  Negative for chest pain, palpitations and leg swelling.  Gastrointestinal:  Negative for abdominal pain, constipation, diarrhea, nausea and vomiting.  Endocrine: Negative.   Genitourinary: Negative.  Negative for  difficulty urinating.  Musculoskeletal:  Negative for myalgias.  Skin: Negative.  Negative for color change.  Neurological:  Negative for dizziness, weakness, light-headedness and headaches.  Hematological:  Negative for adenopathy. Does not bruise/bleed easily.  Psychiatric/Behavioral: Negative.     Objective:  BP (!) 162/84 (BP Location: Right Arm, Patient Position: Sitting, Cuff Size: Large)   Pulse 80   Temp 98.6 F (37 C) (Oral)   Resp 16   Ht 5' 11.5" (1.816 m)   Wt 195 lb (88.5 kg)   SpO2 97%   BMI 26.82 kg/m   BP Readings from Last 3 Encounters:  10/30/20 (!) 162/84  09/14/20 134/81  08/10/20 100/86    Wt Readings from Last 3 Encounters:  10/30/20 195 lb (88.5 kg)  09/13/20 193 lb 2 oz (87.6 kg)  08/10/20 189 lb (85.7 kg)    Physical Exam Vitals reviewed.  HENT:     Mouth/Throat:     Mouth: Mucous membranes are moist.  Eyes:     Conjunctiva/sclera: Conjunctivae normal.  Cardiovascular:     Rate and Rhythm: Normal rate and regular rhythm.     Pulses:          Dorsalis pedis pulses are 1+ on the right side and 1+ on the left side.       Posterior tibial pulses are 1+ on the right side and 1+ on the left side.     Heart sounds: S1 normal and S2 normal. Murmur heard.  Systolic murmur is present with a grade of 1/6.  No diastolic murmur is present.    No gallop.  Pulmonary:     Effort: Pulmonary effort is normal.     Breath sounds: No stridor. No wheezing, rhonchi or rales.  Abdominal:     General: Abdomen is flat.     Palpations: There is no mass.     Tenderness: There is no abdominal tenderness. There is no guarding.     Hernia: No hernia is present.  Musculoskeletal:        General: Normal range of motion.     Cervical back: Neck supple.     Right lower leg: No edema.     Left lower leg: No edema.  Feet:     Right foot:     Skin integrity: Dry skin present. No ulcer, erythema, warmth or callus.     Toenail Condition: Right toenails are normal.      Left foot:     Skin integrity: Dry skin present. No ulcer, erythema, warmth or callus.     Toenail Condition: Left toenails are normal.  Lymphadenopathy:     Cervical: No cervical adenopathy.  Skin:    General: Skin is warm and dry.     Findings: No rash.  Neurological:     General: No focal  deficit present.     Mental Status: He is alert. Mental status is at baseline.  Psychiatric:        Mood and Affect: Mood normal.        Behavior: Behavior normal.    Lab Results  Component Value Date   WBC 8.3 09/14/2020   HGB 11.4 (L) 09/14/2020   HCT 35.7 (L) 09/14/2020   PLT 72 (L) 09/14/2020   GLUCOSE 96 09/14/2020   CHOL 121 10/30/2020   TRIG 115.0 10/30/2020   HDL 34.70 (L) 10/30/2020   LDLDIRECT 80.6 06/26/2010   LDLCALC 63 10/30/2020   ALT 9 09/14/2020   AST 14 (L) 09/14/2020   NA 144 09/14/2020   K 4.6 09/14/2020   CL 113 (H) 09/14/2020   CREATININE 2.25 (H) 09/14/2020   BUN 25 (H) 09/14/2020   CO2 22 09/14/2020   TSH 1.59 08/10/2020   PSA 0.03 (L) 06/29/2009   INR 1.1 09/13/2020   HGBA1C 6.9 (H) 10/30/2020   MICROALBUR 2.4 09/23/2019    DG Chest 2 View  Result Date: 09/13/2020 CLINICAL DATA:  Epigastric pain. EXAM: CHEST - 2 VIEW COMPARISON:  05/09/2014 FINDINGS: Heart size and mediastinal contours appear normal. Aortic atherosclerotic calcifications. No pleural effusion or edema. No airspace densities. No acute osseous findings. IMPRESSION: No acute cardiopulmonary abnormalities. Electronically Signed   By: Kerby Moors M.D.   On: 09/13/2020 11:54   ECHOCARDIOGRAM COMPLETE  Result Date: 09/14/2020    ECHOCARDIOGRAM REPORT   Patient Name:   AYLAN BAYONA Date of Exam: 09/14/2020 Medical Rec #:  [id number]    Height:       71.5 in Accession #:    [id number]   Weight:       193.1 lb Date of Birth:  11-22-1930   BSA:          2.088 m Patient Age:    82 years     BP:           141/71 mmHg Patient Gender: M            HR:           55 bpm. Exam Location:  Inpatient Procedure:  2D Echo, Cardiac Doppler, Color Doppler and Intracardiac            Opacification Agent Indications:    Chest Pain R07.9  History:        Patient has prior history of Echocardiogram examinations, most                 recent 12/26/2017. CHF, CAD, PAD; Risk Factors:Hypertension and                 Dyslipidemia. Chronic kidney disease. COVID positive.  Sonographer:    Darlina Sicilian RDCS Referring Phys: [place]  1. Left ventricular ejection fraction, by estimation, is 65 to 70%. The left ventricle has normal function. The left ventricle has no regional wall motion abnormalities. There is severe asymmetric left ventricular hypertrophy of the basal-septal segment. LVOT peak gradient measuring up to 25mmHg  2. Right ventricular systolic function is normal. The right ventricular size is normal. Tricuspid regurgitation signal is inadequate for assessing PA pressure.  3. The mitral valve is abnormal. Trivial mitral valve regurgitation. No evidence of mitral stenosis. Moderate mitral annular calcification.  4. The aortic valve was not well visualized. Aortic valve regurgitation is trivial. Mild to moderate aortic valve sclerosis/calcification is present, without any evidence of aortic stenosis.  5. The  inferior vena cava is normal in size with greater than 50% respiratory variability, suggesting right atrial pressure of 3 mmHg. FINDINGS  Left Ventricle: Left ventricular ejection fraction, by estimation, is 65 to 70%. The left ventricle has normal function. The left ventricle has no regional wall motion abnormalities. Definity contrast agent was given IV to delineate the left ventricular  endocardial borders. The left ventricular internal cavity size was normal in size. There is severe asymmetric left ventricular hypertrophy of the basal-septal segment. Left ventricular diastolic parameters are indeterminate. Right Ventricle: The right ventricular size is normal. No increase in right ventricular  wall thickness. Right ventricular systolic function is normal. Tricuspid regurgitation signal is inadequate for assessing PA pressure. The tricuspid regurgitant velocity is 1.35 m/s, and with an assumed right atrial pressure of 3 mmHg, the estimated right ventricular systolic pressure is 31.5 mmHg. Left Atrium: Left atrial size was normal in size. Right Atrium: Right atrial size was normal in size. Pericardium: There is no evidence of pericardial effusion. Mitral Valve: The mitral valve is abnormal. Moderate mitral annular calcification. Trivial mitral valve regurgitation. No evidence of mitral valve stenosis. Tricuspid Valve: The tricuspid valve is normal in structure. Tricuspid valve regurgitation is trivial. Aortic Valve: The aortic valve was not well visualized. Aortic valve regurgitation is trivial. Mild to moderate aortic valve sclerosis/calcification is present, without any evidence of aortic stenosis. Pulmonic Valve: The pulmonic valve was not well visualized. Pulmonic valve regurgitation is mild. Aorta: The aortic root and ascending aorta are structurally normal, with no evidence of dilitation. Venous: The inferior vena cava is normal in size with greater than 50% respiratory variability, suggesting right atrial pressure of 3 mmHg. IAS/Shunts: The interatrial septum was not well visualized.  LEFT VENTRICLE PLAX 2D LVIDd:         3.80 cm  Diastology LVIDs:         2.20 cm  LV e' medial:    4.59 cm/s LV PW:         1.40 cm  LV E/e' medial:  18.8 LV IVS:        1.60 cm  LV e' lateral:   5.87 cm/s LVOT diam:     2.05 cm  LV E/e' lateral: 14.7 LV SV:         136 LV SV Index:   65 LVOT Area:     3.30 cm  RIGHT VENTRICLE RV S prime:     11.40 cm/s TAPSE (M-mode): 1.6 cm LEFT ATRIUM             Index       RIGHT ATRIUM           Index LA diam:        3.30 cm 1.58 cm/m  RA Area:     11.90 cm LA Vol (A2C):   45.0 ml 21.55 ml/m RA Volume:   26.20 ml  12.55 ml/m LA Vol (A4C):   46.9 ml 22.46 ml/m LA Biplane Vol:  47.2 ml 22.60 ml/m  AORTIC VALVE              PULMONIC VALVE LVOT Vmax:   192.00 cm/s  PV Vmax:          1.83 m/s LVOT Vmean:  104.000 cm/s PV Peak grad:     13.4 mmHg LVOT VTI:    0.411 m      PR End Diast Vel: 4.49 msec  AORTA Ao Root diam: 3.80 cm Ao Asc diam:  3.50 cm MITRAL VALVE  TRICUSPID VALVE MV Area (PHT): 2.45 cm     TR Peak grad:   7.3 mmHg MV Decel Time: 310 msec     TR Vmax:        135.00 cm/s MV E velocity: 86.20 cm/s MV A velocity: 102.00 cm/s  SHUNTS MV E/A ratio:  0.85         Systemic VTI:  0.41 m                             Systemic Diam: 2.05 cm Oswaldo Milian MD Electronically signed by Oswaldo Milian MD Signature Date/Time: 09/14/2020/5:49:39 PM    Final    CT RENAL STONE STUDY  Result Date: 09/14/2020 CLINICAL DATA:  Left flank pain. EXAM: CT ABDOMEN AND PELVIS WITHOUT CONTRAST TECHNIQUE: Multidetector CT imaging of the abdomen and pelvis was performed following the standard protocol without IV contrast. COMPARISON:  CT scan 04/30/2013 FINDINGS: Lower chest: Streaky bibasilar atelectasis. No infiltrates effusions. The heart is within normal limits in size for age. No pericardial effusion. Aortic calcifications noted. Hepatobiliary: No hepatic lesions are identified without contrast. No intrahepatic biliary dilatation. The gallbladder is surgically absent. No common bile duct dilatation. Pancreas: Advanced fatty atrophy of the pancreas but no mass or inflammation. Spleen: Normal size. No focal lesions. Adrenals/Urinary Tract: Adrenal glands are normal. Simple right renal cyst. No renal or obstructing ureteral calculi are identified. No worrisome renal lesions without contrast. Bladder is grossly normal without contrast. Stomach/Bowel: The stomach, duodenum, small bowel and colon are grossly normal without oral contrast. No acute inflammatory process, mass lesions or obstructive findings. Sigmoid colon diverticulosis is noted. The terminal ileum appendix are.  Vascular/Lymphatic: Advanced atherosclerotic calcifications involving the aorta and branch vessel ostia. No aneurysm. No mesenteric or retroperitoneal mass or adenopathy. Stable scattered IVC calcifications possibly related to chronic calcified clot. Reproductive: Brachytherapy seeds are noted throughout the prostate gland. The seminal vesicles are unremarkable. Other: No pelvic mass or adenopathy. No free pelvic fluid collections. No inguinal mass or adenopathy. No abdominal wall hernia or subcutaneous lesions. Musculoskeletal: No significant bony findings. No fractures or bone lesions. IMPRESSION: 1. No acute abdominal/pelvic findings, mass lesions or adenopathy. 2. No renal, ureteral or bladder calculi or mass. 3. Status post cholecystectomy. No biliary dilatation. 4. Advanced atherosclerotic calcifications involving the aorta and branch vessels. 5. Brachytherapy seeds throughout the prostate gland. Aortic Atherosclerosis (ICD10-I70.0). Electronically Signed   By: Marijo Sanes M.D.   On: 09/14/2020 05:50    Assessment & Plan:   Donnavin was seen today for annual exam, hypertension, hyperlipidemia and diabetes.  Diagnoses and all orders for this visit:  Flu vaccine need -     Flu Vaccine QUAD High Dose(Fluad)  B12 deficiency -     cyanocobalamin ((VITAMIN B-12)) injection 1,000 mcg  ANEMIA, B12 DEFICIENCY  Hyperlipidemia with target LDL less than 70- LDL goal achieved. Doing well on the statin  -     Lipid panel; Future -     Lipid panel  Encounter for general adult medical examination with abnormal findings- exam completed, labs reviewed, vaccines reviewed and updated, no cancer screenings indicated, patient education material was given.  Insulin-requiring or dependent type II diabetes mellitus ([place])- his A1c is at 6.9%.  His blood sugars are adequately well controlled.  Will continue the current insulin regimen. -     Insulin Syringe-Needle U-100 (B-D INS SYR ULTRAFINE 1CC/30G) 30G X 1/2" 1  ML MISC; USE AS DIRECTED  TWICE A DAY -     Hemoglobin A1c; Future -     Hemoglobin A1c  I am having Inetta Fermo maintain his acetaminophen, aspirin EC, Belsomra, G[employer]oke HypoPen 2-Pack, FreeStyle Libre 2 Sensor, [employer] 2 Reader, [employer] Plus [id number], OneTouch Verio, metoprolol tartrate, NovoLIN N FlexPen, telmisartan, isosorbide mononitrate, nitroGLYCERIN, hydrochlorothiazide, allopurinol, atorvastatin, omeprazole, and B-D INS SYR ULTRAFINE 1CC/30G. We administered cyanocobalamin. We will continue to administer bebtelovimab EUA and sodium chloride.  Meds ordered this encounter  Medications   Insulin Syringe-Needle U-100 (B-D INS SYR ULTRAFINE 1CC/30G) 30G X 1/2" 1 ML MISC    Sig: USE AS DIRECTED TWICE A DAY    Dispense:  200 each    Refill:  3   cyanocobalamin ((VITAMIN B-12)) injection 1,000 mcg     Follow-up: Return in about 6 months (around 04/30/2021).  [name], MD

## 2020-10-31 ENCOUNTER — Encounter: Payer: Self-pay | Admitting: Physician Assistant

## 2020-10-31 ENCOUNTER — Ambulatory Visit (INDEPENDENT_AMBULATORY_CARE_PROVIDER_SITE_OTHER): Payer: Medicare Other | Admitting: Physician Assistant

## 2020-10-31 VITALS — BP 170/86 | HR 67 | Ht 72.0 in | Wt 194.0 lb

## 2020-10-31 DIAGNOSIS — K222 Esophageal obstruction: Secondary | ICD-10-CM

## 2020-10-31 DIAGNOSIS — K219 Gastro-esophageal reflux disease without esophagitis: Secondary | ICD-10-CM | POA: Diagnosis not present

## 2020-10-31 DIAGNOSIS — R1013 Epigastric pain: Secondary | ICD-10-CM | POA: Diagnosis not present

## 2020-10-31 MED ORDER — OMEPRAZOLE 40 MG PO CPDR: 40.0000 mg | DELAYED_RELEASE_CAPSULE | Freq: Every day | ORAL | 11 refills | Status: AC

## 2020-10-31 NOTE — Patient Instructions (Addendum)
If you are age 85 or older, your body mass index should be between 23-30. Your Body mass index is 26.31 kg/m. If this is out of the aforementioned range listed, please consider follow up with your Primary Care Provider. ________________________________________________________  The [place] [facility] providers would like to encourage you to use [facility] to communicate with providers for non-urgent requests or questions.  Due to long hold times on the telephone, sending your provider a message by [facility] may be a faster and more efficient way to get a response.  Please allow 48 business hours for a response.  Please remember that this is for non-urgent requests.  _______________________________________________________  Robert Clayton Omeprazole 40 mg 1 capsule every morning prior to breakfast.  Follow up in 1 year or sooner if needed.  Thank you for entrusting me with your care and choosing [facility].  Amy Esterwood, PA-C

## 2020-11-02 ENCOUNTER — Encounter: Payer: Self-pay | Admitting: Physician Assistant

## 2020-11-02 NOTE — Progress Notes (Signed)
Subjective:    Patient ID: Robert Clayton, male    DOB: 1930-05-01, 87 y.o.   MRN: [id number]  HPI Robert Clayton is a pleasant 87 year old African-American male, who comes in today for GI follow-up after brief admission 9/7 through 09/14/2020 at which time he presented with epigastric pain and chest pain. He was last seen here in 2016 and is established with Dr. Havery Moros.  He has history of GERD and at that time was to be scheduled for EGD which the patient did not follow through with. He has history of coronary artery disease status post prior stenting, adult onset diabetes mellitus, hypertension, congestive heart failure, remote history of esophageal stricture requiring dilation, dementia and chronic kidney disease stage III.  He is status postcholecystectomy  Patient was not seen by GI during his admission .  He did have cardiology evaluation and cardiology did not feel that his symptoms were consistent with an acute coronary syndrome.  He did have 2D echo done with EF of 65 to 70%. Interestingly he returned COVID-positive on the day of admission though he was not having any respiratory symptoms.  He was to be treated with monoclonal antibody however not available at the hospital at that time.  He had contraindication to Paxlovid  He underwent CT of the abdomen pelvis by renal stone protocol which showed a normal common bile duct, status postcholecystectomy, some fatty atrophy of the pancreas, sigmoid diverticulosis and advanced aortic calcifications and branch vessel calcifications.  Prostate seed implants present. Chest x-ray negative  Patient says today that he has not had any chest or abdominal discomfort since discharge from the hospital.  To his recollection when he was admitted he had been having epigastric and lower chest discomfort for couple of days, no associated nausea or vomiting no fever or chills.  He had been on omeprazole chronically but on closer review of his medications he says that he  believes he had actually stopped this medication a while back because he had read one of the package inserts and was concerned about side effects. Currently appetite has been fine, he has no complaints of nausea, he has been eating well no dysphagia or odynophagia.  Review of Systems.Pertinent positive and negative review of systems were noted in the above HPI section.  All other review of systems was otherwise negative.   Outpatient Encounter Medications as of 10/31/2020  Medication Sig   acetaminophen (TYLENOL) 325 MG tablet Take 650 mg by mouth every 6 (six) hours as needed for mild pain.   allopurinol (ZYLOPRIM) 300 MG tablet TAKE 1 TABLET BY MOUTH EVERY DAY   aspirin EC 81 MG tablet Take 81 mg by mouth daily.   atorvastatin (LIPITOR) 10 MG tablet TAKE 1 TABLET BY MOUTH EVERY DAY   Glucagon (GVOKE HYPOPEN 2-PACK) 1 MG/0.2ML SOAJ Inject 1 Act into the skin daily as needed. (Patient taking differently: Inject 1 Act into the skin daily as needed (hypoglycemia).)   hydrochlorothiazide (HYDRODIURIL) 12.5 MG tablet Take 12.5 mg by mouth daily.   Insulin NPH, Human,, Isophane, (NOVOLIN N FLEXPEN) 100 UNIT/ML Kiwkpen Inject 5 Units into the skin 3 (three) times daily before meals. (Patient taking differently: Inject 30-80 Units into the skin See admin instructions. Inject 80 units SQ in the  am, then 30 units sq  in the afternoon per wife)   Insulin Syringe-Needle U-100 (B-D INS SYR ULTRAFINE 1CC/30G) 30G X 1/2" 1 ML MISC USE AS DIRECTED TWICE A DAY   isosorbide mononitrate (IMDUR) 30 MG 24  hr tablet TAKE 1 TABLET BY MOUTH EVERY DAY (Patient taking differently: Take 30 mg by mouth daily.)   Lancets (ONETOUCH DELICA PLUS OIZTIW58K) MISC TEST 2 TIMES DAILY. DX: E11.51   metoprolol tartrate (LOPRESSOR) 50 MG tablet TAKE 1 TABLET TWICE A DAY (Patient taking differently: Take 50 mg by mouth 2 (two) times daily.)   nitroGLYCERIN (NITROSTAT) 0.4 MG SL tablet Place 1 tablet (0.4 mg total) under the tongue  every 5 (five) minutes as needed. x3 doses as needed for chest pain (Patient taking differently: Place 0.4 mg under the tongue every 5 (five) minutes x 3 doses as needed for chest pain.)   ONETOUCH VERIO test strip USE TO TEST BLOOD SUGAR TWICE A DAY DX E11.51   Suvorexant (BELSOMRA) 15 MG TABS Take 1 tablet by mouth at bedtime as needed. (Patient taking differently: Take 15 mg by mouth at bedtime as needed (sleep).)   telmisartan (MICARDIS) 80 MG tablet TAKE 1 TABLET BY MOUTH EVERY DAY (Patient taking differently: Take 80 mg by mouth daily.)   [DISCONTINUED] omeprazole (PRILOSEC) 40 MG capsule TAKE 1 CAPSULE BY MOUTH EVERY DAY   omeprazole (PRILOSEC) 40 MG capsule Take 1 capsule (40 mg total) by mouth daily before breakfast.   [DISCONTINUED] Continuous Blood Gluc Receiver (FREESTYLE LIBRE 2 READER) DEVI 1 Act by Does not apply route daily.   [DISCONTINUED] Continuous Blood Gluc Sensor (FREESTYLE LIBRE 2 SENSOR) MISC 1 Act by Does not apply route daily.   Facility-Administered Encounter Medications as of 10/31/2020  Medication   0.9 %  sodium chloride infusion   bebtelovimab EUA injection SOLN 175 mg   Allergies  Allergen Reactions   Amlodipine Other (See Comments)    headache   Lipitor [Atorvastatin Calcium] Other (See Comments)    Leg cramps Patient last filled lipito 06/23/20 for 90DS    Patient Active Problem List   Diagnosis Date Noted   Encounter for general adult medical examination with abnormal findings 10/30/2020   B12 deficiency 10/30/2020   Insulin-requiring or dependent type II diabetes mellitus ([place]) 05/29/2020   Psychophysiological insomnia 09/23/2019   LVH (left ventricular hypertrophy) due to hypertensive disease, with heart failure ([place]) 99/83/3825   Diastolic congestive heart failure, NYHA class 1 ([place]) 01/26/2018   Murmur, cardiac 11/25/2017   Essential hypertension 11/03/2017   CRI (chronic renal insufficiency), stage 3 (moderate) ([place]) 01/28/2017   Venous stasis  dermatitis of both lower extremities 10/31/2014   Gastroesophageal reflux disease with esophagitis 10/31/2014   Esophageal stricture 10/31/2014   Painful diabetic neuropathy ([place]) 11/22/2013   Posterior subcapsular cataract, left 10/25/2013   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 presenium ([place]) 09/13/2012   ANEMIA, B12 DEFICIENCY 04/29/2008   DM (diabetes mellitus), type 2, uncontrolled, periph vascular complic 05/39/7673   GOUT 03/23/2008   Hyperlipidemia with target LDL less than 70 03/13/2008   Hypertensive renal disease 03/13/2008   CAD, NATIVE VESSEL 03/13/2008   Atherosclerosis of native artery of extremity with intermittent claudication ([place]) 03/13/2008   Social History   Socioeconomic History   Marital status: Married    Spouse name: Plassie   Number of children: 5   Years of education: Not on file   Highest education level: Not on file  Occupational History   Occupation: RETIRED    Employer: [employer]  Tobacco Use   Smoking status: Never   Smokeless tobacco: Never  Vaping Use   Vaping Use: Never used  Substance and Sexual Activity   Alcohol use: No   Drug use:  No   Sexual activity: Not Currently  Other Topics Concern   Not on file  Social History Narrative   DAILY CAFFEINE USE 1 DRINK PER DAY   MARRIED   5 CHILDREN   RETIRED FROM [employer]   NO ETOH OR TOBACCO USE         PATIENT SIGNED DESIGNATED PARTY RELEASE STATING HE DOES NOT WISH FOR ANYONE TO HAVE ACCESS TO HIS MEDICAL RECORDS/INFORMATION. [name] April 05, 2009 11:37 AM            Social Determinants of Health   Financial Resource Strain: Not on file  Food Insecurity: No Food Insecurity   Worried About Charity fundraiser in the Last Year: Never true   Ran Out of Food in the Last Year: Never true  Transportation Needs: No Transportation Needs   Lack of Transportation (Medical): No   Lack of Transportation (Non-Medical): No  Physical Activity: Not on file  Stress: Not on file  Social  Connections: Not on file  Intimate Partner Violence: Not At Risk   Fear of Current or Ex-Partner: No   Emotionally Abused: No   Physically Abused: No   Sexually Abused: No    Mr. Muscat family history includes Cancer in an other family member; Diabetes in his mother.      Objective:    Vitals:   10/31/20 1443  BP: (!) 170/86  Pulse: 67    Physical Exam.Well-developed well-nourished somewhat frail-appearing elderly African-American male in no acute distress.  Accompanied by his wife.  Height, Weight, 194 BMI 26.3  HEENT; nontraumatic normocephalic, EOMI, PE R LA, sclera anicteric. Oropharynx; not examined today Neck; supple, no JVD Cardiovascular; regular rate and rhythm with S1-S2, no murmur rub or gallop Pulmonary; Clear bilaterally Abdomen; soft, nontender, nondistended, no palpable mass or hepatosplenomegaly, bowel sounds are active Rectal; not done today Skin; benign exam, no jaundice rash or appreciable lesions Extremities; no clubbing cyanosis or edema skin warm and dry Neuro/Psych; alert and oriented x4, grossly nonfocal mood and affect appropriate        Assessment & Plan:   #47 87 year old African-American male here for GI follow-up after hospitalization 9/7 through 09/14/2020 with 2-day history of epigastric and lower chest discomfort  Initial cardiac work-up was negative, 2D echo stable and cardiology did not feel symptoms were consistent with acute coronary syndrome CT abdomen and pelvis by renal stone protocol no acute intra-abdominal findings, patient status post cholecystectomy with fatty atrophy of the pancreas and advanced atherosclerotic changes  Patient was COVID-positive at the time of admission, no respiratory symptoms, did not require any treatment. Query at this time whether his abdominal pain may have actually been a manifestation of the COVID infection.  Patient has not had any GI complaints over the past 6 weeks since this admission. He had  mistakenly taken himself off of omeprazole due to concerns after reading a package insert.  #2 history of chronic GERD and remote peptic stricture-no current dysphagia  #3 coronary artery disease status post prior stenting #4.  Adult onset diabetes mellitus #5.  Chronic kidney disease stage III #6.  Hypertension #7.  Dementia  Plan; I do not think patient needs any further GI evaluation at this time as he has been completely asymptomatic since hospitalization. I do think he should stay on chronic PPI therapy given history of chronic GERD and prior peptic stricture.  We will resume omeprazole 40 mg p.o. every morning AC breakfast, and patient is agreeable. He will follow-up  with GI on a as needed basis.    Laure Leone Genia Harold PA-C 11/02/2020   Cc: Janith Lima, MD

## 2020-11-03 NOTE — Progress Notes (Signed)
Agree with assessment and plan as outlined.  

## 2020-11-07 DIAGNOSIS — E119 Type 2 diabetes mellitus without complications: Secondary | ICD-10-CM | POA: Diagnosis not present

## 2020-11-07 DIAGNOSIS — Z961 Presence of intraocular lens: Secondary | ICD-10-CM | POA: Diagnosis not present

## 2020-11-07 DIAGNOSIS — Z794 Long term (current) use of insulin: Secondary | ICD-10-CM | POA: Diagnosis not present

## 2020-12-04 ENCOUNTER — Ambulatory Visit (INDEPENDENT_AMBULATORY_CARE_PROVIDER_SITE_OTHER): Payer: Medicare Other

## 2020-12-04 ENCOUNTER — Other Ambulatory Visit: Payer: Self-pay

## 2020-12-04 DIAGNOSIS — E538 Deficiency of other specified B group vitamins: Secondary | ICD-10-CM

## 2020-12-04 MED ORDER — CYANOCOBALAMIN 1000 MCG/ML IJ SOLN
1000.0000 ug | Freq: Once | INTRAMUSCULAR | Status: AC
  Administered 2020-12-04: 15:00:00 1000 ug via INTRAMUSCULAR

## 2020-12-04 NOTE — Progress Notes (Signed)
Pt was given B12 w/o any complications. 

## 2020-12-19 ENCOUNTER — Other Ambulatory Visit: Payer: Self-pay | Admitting: Internal Medicine

## 2021-01-04 ENCOUNTER — Other Ambulatory Visit: Payer: Self-pay

## 2021-01-04 ENCOUNTER — Ambulatory Visit (INDEPENDENT_AMBULATORY_CARE_PROVIDER_SITE_OTHER): Payer: Medicare Other

## 2021-01-04 DIAGNOSIS — E538 Deficiency of other specified B group vitamins: Secondary | ICD-10-CM | POA: Diagnosis not present

## 2021-01-04 MED ORDER — CYANOCOBALAMIN 1000 MCG/ML IJ SOLN
1000.0000 ug | Freq: Once | INTRAMUSCULAR | Status: AC
  Administered 2021-01-04: 15:00:00 1000 ug via INTRAMUSCULAR

## 2021-01-04 NOTE — Progress Notes (Signed)
Pt here for monthly B12 injection   B12 1094mcg given IM, and pt tolerated injection well.

## 2021-01-16 ENCOUNTER — Ambulatory Visit: Payer: Medicare (Managed Care) | Admitting: Podiatry

## 2021-01-16 ENCOUNTER — Other Ambulatory Visit: Payer: Self-pay

## 2021-02-01 ENCOUNTER — Telehealth: Payer: Self-pay | Admitting: Internal Medicine

## 2021-02-01 NOTE — Telephone Encounter (Signed)
LVM for pt to rtn my call to schedule AWV with [facility]. Please schedule AWV if pt calls the office  

## 2021-03-24 ENCOUNTER — Other Ambulatory Visit: Payer: Self-pay | Admitting: Internal Medicine

## 2021-05-12 ENCOUNTER — Other Ambulatory Visit: Payer: Self-pay | Admitting: Internal Medicine

## 2021-05-12 DIAGNOSIS — I25110 Atherosclerotic heart disease of native coronary artery with unstable angina pectoris: Secondary | ICD-10-CM

## 2021-05-18 ENCOUNTER — Telehealth: Payer: Self-pay | Admitting: *Deleted

## 2021-05-18 ENCOUNTER — Encounter: Payer: Self-pay | Admitting: *Deleted

## 2021-05-18 DIAGNOSIS — I2510 Atherosclerotic heart disease of native coronary artery without angina pectoris: Secondary | ICD-10-CM

## 2021-05-18 DIAGNOSIS — E785 Hyperlipidemia, unspecified: Secondary | ICD-10-CM

## 2021-05-18 MED ORDER — ATORVASTATIN CALCIUM 10 MG PO TABS: 10.0000 mg | ORAL_TABLET | Freq: Every day | ORAL | 0 refills | Status: DC

## 2021-05-18 NOTE — Telephone Encounter (Signed)
30-day sent in-May need follow-up appointment. ?

## 2021-05-18 NOTE — Telephone Encounter (Signed)
This encounter was created in error - please disregard.

## 2021-05-18 NOTE — Telephone Encounter (Signed)
Message sent in provider absence. Patient is requesting a refill of their atorvastatin .  ? ?Please advise  ?

## 2021-05-26 ENCOUNTER — Other Ambulatory Visit: Payer: Self-pay | Admitting: Internal Medicine

## 2021-05-26 DIAGNOSIS — I129 Hypertensive chronic kidney disease with stage 1 through stage 4 chronic kidney disease, or unspecified chronic kidney disease: Secondary | ICD-10-CM

## 2021-06-15 ENCOUNTER — Telehealth: Payer: Self-pay | Admitting: Hematology

## 2021-06-15 NOTE — Telephone Encounter (Signed)
Scheduled appt per 6/9 referral. Pt is aware of appt date and time. Pt is aware to arrive 15 mins prior to appt time and to bring and updated insurance card. Pt is aware of appt location.   

## 2021-06-27 ENCOUNTER — Other Ambulatory Visit: Payer: Self-pay | Admitting: Internal Medicine

## 2021-06-27 ENCOUNTER — Telehealth: Payer: Self-pay | Admitting: Internal Medicine

## 2021-06-27 DIAGNOSIS — E785 Hyperlipidemia, unspecified: Secondary | ICD-10-CM

## 2021-06-27 DIAGNOSIS — I2510 Atherosclerotic heart disease of native coronary artery without angina pectoris: Secondary | ICD-10-CM

## 2021-06-27 MED ORDER — ATORVASTATIN CALCIUM 10 MG PO TABS: 10.0000 mg | ORAL_TABLET | Freq: Every day | ORAL | 0 refills | Status: DC

## 2021-06-27 NOTE — Telephone Encounter (Signed)
Left message for patient to call back to schedule Medicare Annual Wellness Visit   Last AWV  06/30/18  Please schedule at anytime with [facility] if patient calls the office back.      Any questions, please call me at [phone]

## 2021-07-03 ENCOUNTER — Other Ambulatory Visit: Payer: Self-pay

## 2021-07-03 ENCOUNTER — Inpatient Hospital Stay: Payer: Medicare (Managed Care)

## 2021-07-03 ENCOUNTER — Inpatient Hospital Stay: Payer: Medicare (Managed Care) | Attending: Hematology | Admitting: Hematology

## 2021-07-03 VITALS — BP 166/82 | HR 69 | Temp 97.9°F | Resp 18 | Wt 190.9 lb

## 2021-07-03 DIAGNOSIS — Z809 Family history of malignant neoplasm, unspecified: Secondary | ICD-10-CM | POA: Insufficient documentation

## 2021-07-03 DIAGNOSIS — D696 Thrombocytopenia, unspecified: Secondary | ICD-10-CM | POA: Insufficient documentation

## 2021-07-03 DIAGNOSIS — N189 Chronic kidney disease, unspecified: Secondary | ICD-10-CM | POA: Diagnosis not present

## 2021-07-03 DIAGNOSIS — E1122 Type 2 diabetes mellitus with diabetic chronic kidney disease: Secondary | ICD-10-CM | POA: Diagnosis not present

## 2021-07-03 DIAGNOSIS — I130 Hypertensive heart and chronic kidney disease with heart failure and stage 1 through stage 4 chronic kidney disease, or unspecified chronic kidney disease: Secondary | ICD-10-CM | POA: Diagnosis not present

## 2021-07-03 DIAGNOSIS — I5032 Chronic diastolic (congestive) heart failure: Secondary | ICD-10-CM | POA: Insufficient documentation

## 2021-07-03 DIAGNOSIS — M109 Gout, unspecified: Secondary | ICD-10-CM

## 2021-07-03 DIAGNOSIS — E785 Hyperlipidemia, unspecified: Secondary | ICD-10-CM | POA: Diagnosis not present

## 2021-07-03 DIAGNOSIS — Z87440 Personal history of urinary (tract) infections: Secondary | ICD-10-CM | POA: Insufficient documentation

## 2021-07-03 LAB — CBC WITH DIFFERENTIAL (CANCER CENTER ONLY)
Abs Immature Granulocytes: 0.02 K/uL (ref 0.00–0.07)
Basophils Absolute: 0 K/uL (ref 0.0–0.1)
Basophils Relative: 0 %
Eosinophils Absolute: 0.1 K/uL (ref 0.0–0.5)
Eosinophils Relative: 2 %
HCT: 39.3 % (ref 39.0–52.0)
Hemoglobin: 12.7 g/dL — ABNORMAL LOW (ref 13.0–17.0)
Immature Granulocytes: 0 %
Lymphocytes Relative: 23 %
Lymphs Abs: 1.9 K/uL (ref 0.7–4.0)
MCH: 30 pg (ref 26.0–34.0)
MCHC: 32.3 g/dL (ref 30.0–36.0)
MCV: 92.9 fL (ref 80.0–100.0)
Monocytes Absolute: 0.7 K/uL (ref 0.1–1.0)
Monocytes Relative: 9 %
Neutro Abs: 5.5 K/uL (ref 1.7–7.7)
Neutrophils Relative %: 66 %
Platelet Count: 67 K/uL — ABNORMAL LOW (ref 150–400)
RBC: 4.23 MIL/uL (ref 4.22–5.81)
RDW: 14.1 % (ref 11.5–15.5)
WBC Count: 8.3 K/uL (ref 4.0–10.5)
nRBC: 0 % (ref 0.0–0.2)

## 2021-07-03 LAB — CMP (CANCER CENTER ONLY)
ALT: 13 U/L (ref 0–44)
AST: 15 U/L (ref 15–41)
Albumin: 4 g/dL (ref 3.5–5.0)
Alkaline Phosphatase: 94 U/L (ref 38–126)
Anion gap: 6 (ref 5–15)
BUN: 35 mg/dL — ABNORMAL HIGH (ref 8–23)
CO2: 24 mmol/L (ref 22–32)
Calcium: 9.3 mg/dL (ref 8.9–10.3)
Chloride: 116 mmol/L — ABNORMAL HIGH (ref 98–111)
Creatinine: 2.81 mg/dL — ABNORMAL HIGH (ref 0.61–1.24)
GFR, Estimated: 21 mL/min — ABNORMAL LOW (ref >60)
Glucose, Bld: 134 mg/dL — ABNORMAL HIGH (ref 70–99)
Potassium: 5 mmol/L (ref 3.5–5.1)
Sodium: 146 mmol/L — ABNORMAL HIGH (ref 135–145)
Total Bilirubin: 0.7 mg/dL (ref 0.3–1.2)
Total Protein: 7 g/dL (ref 6.5–8.1)

## 2021-07-03 LAB — IMMATURE PLATELET FRACTION: Immature Platelet Fraction: 1.7 % (ref 1.2–8.6)

## 2021-07-03 LAB — VITAMIN B12: Vitamin B-12: 461 pg/mL (ref 180–914)

## 2021-07-03 LAB — IRON AND IRON BINDING CAPACITY (CC-WL,HP ONLY)
Iron: 78 ug/dL (ref 45–182)
Saturation Ratios: 37 % (ref 17.9–39.5)
TIBC: 210 ug/dL — ABNORMAL LOW (ref 250–450)
UIBC: 132 ug/dL (ref 117–376)

## 2021-07-03 LAB — LACTATE DEHYDROGENASE: LDH: 207 U/L — ABNORMAL HIGH (ref 98–192)

## 2021-07-03 LAB — HEPATITIS C ANTIBODY: HCV Ab: NONREACTIVE
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
## 2021-07-04 LAB — FOLATE RBC
Folate, Hemolysate: 320 ng/mL
Folate, RBC: 818 ng/mL (ref >498)
Hematocrit: 39.1 % (ref 37.5–51.0)

## 2021-07-04 LAB — FERRITIN: Ferritin: 202 ng/mL (ref 24–336)

## 2021-07-05 ENCOUNTER — Telehealth: Payer: Self-pay | Admitting: Hematology

## 2021-07-05 NOTE — Telephone Encounter (Signed)
Unable to leave message on patient's phone regarding follow-up appointment per 6/27 los. Left message on granddaughter's phone with follow-up appointment per [facility].

## 2021-07-09 ENCOUNTER — Inpatient Hospital Stay: Payer: Medicare (Managed Care) | Attending: Hematology | Admitting: Hematology

## 2021-07-09 DIAGNOSIS — N189 Chronic kidney disease, unspecified: Secondary | ICD-10-CM | POA: Insufficient documentation

## 2021-07-09 DIAGNOSIS — I2510 Atherosclerotic heart disease of native coronary artery without angina pectoris: Secondary | ICD-10-CM | POA: Insufficient documentation

## 2021-07-09 DIAGNOSIS — E538 Deficiency of other specified B group vitamins: Secondary | ICD-10-CM | POA: Insufficient documentation

## 2021-07-09 DIAGNOSIS — Z794 Long term (current) use of insulin: Secondary | ICD-10-CM | POA: Diagnosis not present

## 2021-07-09 DIAGNOSIS — Z9079 Acquired absence of other genital organ(s): Secondary | ICD-10-CM | POA: Diagnosis not present

## 2021-07-09 DIAGNOSIS — E1122 Type 2 diabetes mellitus with diabetic chronic kidney disease: Secondary | ICD-10-CM | POA: Diagnosis not present

## 2021-07-09 DIAGNOSIS — Z7982 Long term (current) use of aspirin: Secondary | ICD-10-CM | POA: Insufficient documentation

## 2021-07-09 DIAGNOSIS — Z8546 Personal history of malignant neoplasm of prostate: Secondary | ICD-10-CM | POA: Insufficient documentation

## 2021-07-09 DIAGNOSIS — Z87440 Personal history of urinary (tract) infections: Secondary | ICD-10-CM | POA: Diagnosis not present

## 2021-07-09 DIAGNOSIS — D696 Thrombocytopenia, unspecified: Secondary | ICD-10-CM

## 2021-07-09 DIAGNOSIS — Z79899 Other long term (current) drug therapy: Secondary | ICD-10-CM | POA: Insufficient documentation

## 2021-07-09 DIAGNOSIS — Z923 Personal history of irradiation: Secondary | ICD-10-CM | POA: Diagnosis not present

## 2021-07-09 DIAGNOSIS — I130 Hypertensive heart and chronic kidney disease with heart failure and stage 1 through stage 4 chronic kidney disease, or unspecified chronic kidney disease: Secondary | ICD-10-CM | POA: Diagnosis not present

## 2021-07-09 DIAGNOSIS — K219 Gastro-esophageal reflux disease without esophagitis: Secondary | ICD-10-CM | POA: Diagnosis not present

## 2021-07-09 DIAGNOSIS — I5032 Chronic diastolic (congestive) heart failure: Secondary | ICD-10-CM | POA: Diagnosis not present
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
## 2021-07-10 LAB — MULTIPLE MYELOMA PANEL, SERUM
Albumin SerPl Elph-Mcnc: 3.6 g/dL (ref 2.9–4.4)
Albumin/Glob SerPl: 1.3 (ref 0.7–1.7)
Alpha 1: 0.2 g/dL (ref 0.0–0.4)
Alpha2 Glob SerPl Elph-Mcnc: 0.7 g/dL (ref 0.4–1.0)
B-Globulin SerPl Elph-Mcnc: 0.8 g/dL (ref 0.7–1.3)
Gamma Glob SerPl Elph-Mcnc: 1.1 g/dL (ref 0.4–1.8)
Globulin, Total: 2.8 g/dL (ref 2.2–3.9)
IgA: 194 mg/dL (ref 61–437)
IgG (Immunoglobin G), Serum: 1186 mg/dL (ref 603–1613)
IgM (Immunoglobulin M), Srm: 21 mg/dL (ref 15–143)
Total Protein ELP: 6.4 g/dL (ref 6.0–8.5)

## 2021-07-12 ENCOUNTER — Telehealth: Payer: Self-pay | Admitting: Hematology

## 2021-07-12 NOTE — Telephone Encounter (Addendum)
Scheduled follow-up appointment per 7/3 los. Patient is aware. Mailed calendar.

## 2021-08-23 ENCOUNTER — Other Ambulatory Visit: Payer: Self-pay | Admitting: Internal Medicine

## 2021-08-23 DIAGNOSIS — I2510 Atherosclerotic heart disease of native coronary artery without angina pectoris: Secondary | ICD-10-CM

## 2021-08-23 DIAGNOSIS — E785 Hyperlipidemia, unspecified: Secondary | ICD-10-CM

## 2021-08-30 ENCOUNTER — Other Ambulatory Visit: Payer: Self-pay | Admitting: Hematology

## 2021-08-31 ENCOUNTER — Telehealth: Payer: Self-pay | Admitting: Hematology

## 2021-08-31 NOTE — Telephone Encounter (Signed)
Contacted patient to scheduled appointments. Patient is aware of appointments that are scheduled.   

## 2021-09-06 ENCOUNTER — Other Ambulatory Visit: Payer: Self-pay

## 2021-09-06 ENCOUNTER — Inpatient Hospital Stay: Payer: Medicare (Managed Care) | Attending: Hematology

## 2021-09-06 VITALS — BP 154/78 | HR 85 | Temp 98.0°F | Resp 18

## 2021-09-06 DIAGNOSIS — E538 Deficiency of other specified B group vitamins: Secondary | ICD-10-CM | POA: Diagnosis present

## 2021-09-06 DIAGNOSIS — D696 Thrombocytopenia, unspecified: Secondary | ICD-10-CM | POA: Insufficient documentation

## 2021-09-06 MED ORDER — CYANOCOBALAMIN 1000 MCG/ML IJ SOLN
1000.0000 ug | INTRAMUSCULAR | Status: DC
  Administered 2021-09-06: 1000 ug via SUBCUTANEOUS
  Filled 2021-09-06: qty 1

## 2021-09-06 NOTE — Patient Instructions (Signed)
Vitamin B12 Injection What is this medication? Vitamin B12 (VAHY tuh min B12) prevents and treats low vitamin B12 levels in your body. It is used in people who do not get enough vitamin B12 from their diet or when their digestive tract does not absorb enough. Vitamin B12 plays an important role in maintaining the health of your nervous system and red blood cells. This medicine may be used for other purposes; ask your health care provider or pharmacist if you have questions. COMMON BRAND NAME(S): B-12 Compliance Kit, B-12 Injection Kit, Cyomin, Dodex, LA-12, Nutri-Twelve, Physicians EZ Use B-12, Primabalt What should I tell my care team before I take this medication? They need to know if you have any of these conditions: Kidney disease Leber's disease Megaloblastic anemia An unusual or allergic reaction to cyanocobalamin, cobalt, other medications, foods, dyes, or preservatives Pregnant or trying to get pregnant Breast-feeding How should I use this medication? This medication is injected into a muscle or deeply under the skin. It is usually given in a clinic or care team's office. However, your care team may teach you how to inject yourself. Follow all instructions. Talk to your care team about the use of this medication in children. Special care may be needed. Overdosage: If you think you have taken too much of this medicine contact a poison control center or emergency room at once. NOTE: This medicine is only for you. Do not share this medicine with others. What if I miss a dose? If you are given your dose at a clinic or care team's office, call to reschedule your appointment. If you give your own injections, and you miss a dose, take it as soon as you can. If it is almost time for your next dose, take only that dose. Do not take double or extra doses. What may interact with this medication? Alcohol Colchicine This list may not describe all possible interactions. Give your health care  provider a list of all the medicines, herbs, non-prescription drugs, or dietary supplements you use. Also tell them if you smoke, drink alcohol, or use illegal drugs. Some items may interact with your medicine. What should I watch for while using this medication? Visit your care team regularly. You may need blood work done while you are taking this medication. You may need to follow a special diet. Talk to your care team. Limit your alcohol intake and avoid smoking to get the best benefit. What side effects may I notice from receiving this medication? Side effects that you should report to your care team as soon as possible: Allergic reactions--skin rash, itching, hives, swelling of the face, lips, tongue, or throat Swelling of the ankles, hands, or feet Trouble breathing Side effects that usually do not require medical attention (report to your care team if they continue or are bothersome): Diarrhea This list may not describe all possible side effects. Call your doctor for medical advice about side effects. You may report side effects to FDA at 1-800-FDA-1088. Where should I keep my medication? Keep out of the reach of children. Store at room temperature between 15 and 30 degrees C (59 and 85 degrees F). Protect from light. Throw away any unused medication after the expiration date. NOTE: This sheet is a summary. It may not cover all possible information. If you have questions about this medicine, talk to your doctor, pharmacist, or health care provider.  2023 Elsevier/Gold Standard (2020-03-02 00:00:00)

## 2021-10-04 ENCOUNTER — Inpatient Hospital Stay: Payer: Medicare (Managed Care) | Attending: Hematology

## 2021-10-04 DIAGNOSIS — E538 Deficiency of other specified B group vitamins: Secondary | ICD-10-CM | POA: Diagnosis not present

## 2021-10-04 MED ORDER — CYANOCOBALAMIN 1000 MCG/ML IJ SOLN
1000.0000 ug | INTRAMUSCULAR | Status: DC
  Administered 2021-10-04: 1000 ug via SUBCUTANEOUS
  Filled 2021-10-04: qty 1

## 2021-11-01 ENCOUNTER — Other Ambulatory Visit: Payer: Self-pay

## 2021-11-01 ENCOUNTER — Inpatient Hospital Stay: Payer: Medicare (Managed Care) | Attending: Hematology

## 2021-11-01 DIAGNOSIS — E538 Deficiency of other specified B group vitamins: Secondary | ICD-10-CM | POA: Insufficient documentation

## 2021-11-01 MED ORDER — CYANOCOBALAMIN 1000 MCG/ML IJ SOLN
1000.0000 ug | INTRAMUSCULAR | Status: DC
  Administered 2021-11-01: 1000 ug via SUBCUTANEOUS
  Filled 2021-11-01: qty 1

## 2021-11-01 NOTE — Patient Instructions (Signed)
Vitamin B12 Deficiency Vitamin B12 deficiency means that your body does not have enough vitamin B12. The body needs this important vitamin: To make red blood cells. To make genes (DNA). To help the nerves work. If you do not have enough vitamin B12 in your body, you can have health problems, such as not having enough red blood cells in the blood (anemia). What are the causes? Not eating enough foods that contain vitamin B12. Not being able to take in (absorb) vitamin B12 from the food that you eat. Certain diseases. A condition in which the body does not make enough of a certain protein. This results in your body not taking in enough vitamin B12. Having a surgery in which part of the stomach or small intestine is taken out. Taking medicines that make it hard for the body to take in vitamin B12. These include: Heartburn medicines. Some medicines that are used to treat diabetes. What increases the risk? Being an older adult. Eating a vegetarian or vegan diet that does not include any foods that come from animals. Not eating enough foods that contain vitamin B12 while you are pregnant. Taking certain medicines. Having alcoholism. What are the signs or symptoms? In some cases, there are no symptoms. If the condition leads to too few blood cells or nerve damage, symptoms can occur, such as: Feeling weak or tired. Not being hungry. Losing feeling (numbness) or tingling in your hands and feet. Redness and burning of the tongue. Feeling sad (depressed). Confusion or memory problems. Trouble walking. If anemia is very bad, symptoms can include: Being short of breath. Being dizzy. Having a very fast heartbeat. How is this treated? Changing the way you eat and drink, such as: Eating more foods that contain vitamin B12. Drinking little or no alcohol. Getting vitamin B12 shots. Taking vitamin B12 supplements by mouth (orally). Your doctor will tell you the dose that is best for you. Follow  these instructions at home: Eating and drinking  Eat foods that come from animals and have a lot of vitamin B12 in them. These include: Meats and poultry. This includes beef, pork, chicken, turkey, and organ meats, such as liver. Seafood, such as clams, rainbow trout, salmon, tuna, and haddock. Eggs. Dairy foods such as milk, yogurt, and cheese. Eat breakfast cereals that have vitamin B12 added to them (are fortified). Check the label. The items listed above may not be a complete list of foods and beverages you can eat and drink. Contact a dietitian for more information. Alcohol use Do not drink alcohol if: Your doctor tells you not to drink. You are pregnant, may be pregnant, or are planning to become pregnant. If you drink alcohol: Limit how much you have to: 0-1 drink a day for women. 0-2 drinks a day for men. Know how much alcohol is in your drink. In the [place] one drink equals one 12 oz bottle of beer (355 mL), one 5 oz glass of wine (148 mL), or one 1 oz glass of hard liquor (44 mL). General instructions Get any vitamin B12 shots if told by your doctor. Take supplements only as told by your doctor. Follow the directions. Keep all follow-up visits. Contact a doctor if: Your symptoms come back. Your symptoms get worse or do not get better with treatment. Get help right away if: You have trouble breathing. You have a very fast heartbeat. You have chest pain. You get dizzy. You faint. These symptoms may be an emergency. Get help right away. Call 911.   Do not wait to see if the symptoms will go away. Do not drive yourself to the hospital. Summary Vitamin B12 deficiency means that your body is not getting enough of the vitamin. In some cases, there are no symptoms of this condition. Treatment may include making a change in the way you eat and drink, getting shots, or taking supplements. Eat foods that have vitamin B12 in them. This information is not intended to replace advice  given to you by your health care provider. Make sure you discuss any questions you have with your health care provider. Document Revised: 08/18/2020 Document Reviewed: 08/18/2020 Elsevier Patient Education  2023 Elsevier Inc.  

## 2021-11-05 ENCOUNTER — Telehealth: Payer: Self-pay | Admitting: Hematology

## 2021-11-05 NOTE — Telephone Encounter (Signed)
Contacted patient to scheduled appointments. Patient is aware of appointments that are scheduled.   

## 2021-11-30 ENCOUNTER — Other Ambulatory Visit: Payer: Self-pay | Admitting: Internal Medicine

## 2021-11-30 ENCOUNTER — Ambulatory Visit: Payer: Medicare (Managed Care)

## 2021-11-30 DIAGNOSIS — I129 Hypertensive chronic kidney disease with stage 1 through stage 4 chronic kidney disease, or unspecified chronic kidney disease: Secondary | ICD-10-CM

## 2021-11-30 DIAGNOSIS — I25110 Atherosclerotic heart disease of native coronary artery with unstable angina pectoris: Secondary | ICD-10-CM

## 2021-12-04 ENCOUNTER — Other Ambulatory Visit: Payer: Self-pay

## 2021-12-04 ENCOUNTER — Inpatient Hospital Stay: Payer: Medicare (Managed Care) | Attending: Hematology

## 2021-12-04 DIAGNOSIS — E538 Deficiency of other specified B group vitamins: Secondary | ICD-10-CM | POA: Diagnosis not present

## 2021-12-04 MED ORDER — CYANOCOBALAMIN 1000 MCG/ML IJ SOLN
1000.0000 ug | INTRAMUSCULAR | Status: DC
  Administered 2021-12-04: 1000 ug via SUBCUTANEOUS
  Filled 2021-12-04: qty 1

## 2021-12-10 ENCOUNTER — Other Ambulatory Visit: Payer: Self-pay | Admitting: Internal Medicine

## 2021-12-27 ENCOUNTER — Inpatient Hospital Stay: Payer: Medicare (Managed Care) | Attending: Hematology

## 2021-12-28 ENCOUNTER — Telehealth: Payer: Self-pay | Admitting: Hematology

## 2021-12-28 NOTE — Telephone Encounter (Signed)
Per 12/22 IB, pt wofe has been called and reminded about appts on 1/3, confirmed appt

## 2022-01-09 ENCOUNTER — Inpatient Hospital Stay: Payer: Medicare (Managed Care)

## 2022-01-09 ENCOUNTER — Other Ambulatory Visit: Payer: Self-pay

## 2022-01-09 ENCOUNTER — Inpatient Hospital Stay: Payer: Medicare (Managed Care) | Attending: Hematology | Admitting: Hematology

## 2022-01-09 VITALS — BP 178/80 | HR 93 | Temp 97.8°F | Resp 16 | Wt 183.3 lb

## 2022-01-09 DIAGNOSIS — N181 Chronic kidney disease, stage 1: Secondary | ICD-10-CM | POA: Insufficient documentation

## 2022-01-09 DIAGNOSIS — D696 Thrombocytopenia, unspecified: Secondary | ICD-10-CM

## 2022-01-09 DIAGNOSIS — I5030 Unspecified diastolic (congestive) heart failure: Secondary | ICD-10-CM | POA: Diagnosis not present

## 2022-01-09 DIAGNOSIS — I130 Hypertensive heart and chronic kidney disease with heart failure and stage 1 through stage 4 chronic kidney disease, or unspecified chronic kidney disease: Secondary | ICD-10-CM | POA: Insufficient documentation

## 2022-01-09 DIAGNOSIS — E1122 Type 2 diabetes mellitus with diabetic chronic kidney disease: Secondary | ICD-10-CM | POA: Insufficient documentation

## 2022-01-09 DIAGNOSIS — E538 Deficiency of other specified B group vitamins: Secondary | ICD-10-CM

## 2022-01-09 LAB — CBC WITH DIFFERENTIAL (CANCER CENTER ONLY)
Abs Immature Granulocytes: 0.03 10*3/uL (ref 0.00–0.07)
Basophils Absolute: 0 10*3/uL (ref 0.0–0.1)
Basophils Relative: 0 %
Eosinophils Absolute: 0.1 10*3/uL (ref 0.0–0.5)
Eosinophils Relative: 1 %
HCT: 36.3 % — ABNORMAL LOW (ref 39.0–52.0)
Hemoglobin: 11.9 g/dL — ABNORMAL LOW (ref 13.0–17.0)
Immature Granulocytes: 0 %
Lymphocytes Relative: 28 %
Lymphs Abs: 2 10*3/uL (ref 0.7–4.0)
MCH: 30.5 pg (ref 26.0–34.0)
MCHC: 32.8 g/dL (ref 30.0–36.0)
MCV: 93.1 fL (ref 80.0–100.0)
Monocytes Absolute: 0.6 10*3/uL (ref 0.1–1.0)
Monocytes Relative: 8 %
Neutro Abs: 4.5 10*3/uL (ref 1.7–7.7)
Neutrophils Relative %: 63 %
Platelet Count: 72 10*3/uL — ABNORMAL LOW (ref 150–400)
RBC: 3.9 MIL/uL — ABNORMAL LOW (ref 4.22–5.81)
RDW: 14 % (ref 11.5–15.5)
WBC Count: 7.3 10*3/uL (ref 4.0–10.5)
nRBC: 0 % (ref 0.0–0.2)

## 2022-01-09 LAB — CMP (CANCER CENTER ONLY)
ALT: 10 U/L (ref 0–44)
AST: 14 U/L — ABNORMAL LOW (ref 15–41)
Albumin: 3.9 g/dL (ref 3.5–5.0)
Alkaline Phosphatase: 78 U/L (ref 38–126)
Anion gap: 7 (ref 5–15)
BUN: 35 mg/dL — ABNORMAL HIGH (ref 8–23)
CO2: 23 mmol/L (ref 22–32)
Calcium: 9.1 mg/dL (ref 8.9–10.3)
Chloride: 111 mmol/L (ref 98–111)
Creatinine: 2.77 mg/dL — ABNORMAL HIGH (ref 0.61–1.24)
GFR, Estimated: 21 mL/min — ABNORMAL LOW (ref >60)
Glucose, Bld: 306 mg/dL — ABNORMAL HIGH (ref 70–99)
Potassium: 5.1 mmol/L (ref 3.5–5.1)
Sodium: 141 mmol/L (ref 135–145)
Total Bilirubin: 0.6 mg/dL (ref 0.3–1.2)
Total Protein: 7.1 g/dL (ref 6.5–8.1)

## 2022-01-09 LAB — VITAMIN B12: Vitamin B-12: 510 pg/mL (ref 180–914)

## 2022-01-09 LAB — IMMATURE PLATELET FRACTION: Immature Platelet Fraction: 2.3 % (ref 1.2–8.6)

## 2022-01-09 MED ORDER — CYANOCOBALAMIN 1000 MCG/ML IJ SOLN
1000.0000 ug | INTRAMUSCULAR | Status: DC
  Administered 2022-01-09: 1000 ug via SUBCUTANEOUS
  Filled 2022-01-09: qty 1
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
## 2022-01-15 ENCOUNTER — Encounter: Payer: Self-pay | Admitting: Hematology

## 2022-02-13 ENCOUNTER — Inpatient Hospital Stay: Payer: Medicare (Managed Care) | Attending: Hematology

## 2022-02-13 ENCOUNTER — Other Ambulatory Visit: Payer: Self-pay

## 2022-02-13 DIAGNOSIS — E538 Deficiency of other specified B group vitamins: Secondary | ICD-10-CM | POA: Diagnosis present

## 2022-02-13 MED ORDER — CYANOCOBALAMIN 1000 MCG/ML IJ SOLN
1000.0000 ug | INTRAMUSCULAR | Status: DC
  Administered 2022-02-13: 1000 ug via SUBCUTANEOUS
  Filled 2022-02-13: qty 1

## 2022-03-13 ENCOUNTER — Inpatient Hospital Stay: Payer: Medicare (Managed Care) | Attending: Hematology

## 2022-03-13 ENCOUNTER — Other Ambulatory Visit: Payer: Self-pay

## 2022-03-13 DIAGNOSIS — E538 Deficiency of other specified B group vitamins: Secondary | ICD-10-CM | POA: Insufficient documentation

## 2022-03-13 MED ORDER — CYANOCOBALAMIN 1000 MCG/ML IJ SOLN
1000.0000 ug | INTRAMUSCULAR | Status: DC
  Administered 2022-03-13: 1000 ug via SUBCUTANEOUS
  Filled 2022-03-13: qty 1

## 2022-03-13 NOTE — Patient Instructions (Signed)
Vitamin B12 Deficiency Vitamin B12 deficiency occurs when the body does not have enough of this important vitamin. The body needs this vitamin: To make red blood cells. To make DNA. This is the genetic material inside cells. To help the nerves work properly so they can carry messages from the brain to the body. Vitamin B12 deficiency can cause health problems, such as not having enough red blood cells in the blood (anemia). This can lead to nerve damage if untreated. What are the causes? This condition may be caused by: Not eating enough foods that contain vitamin B12. Not having enough stomach acid and digestive fluids to properly absorb vitamin B12 from the food that you eat. Having certain diseases that make it hard to absorb vitamin B12. These diseases include Crohn's disease, chronic pancreatitis, and cystic fibrosis. An autoimmune disorder in which the body does not make enough of a protein (intrinsic factor) within the stomach, resulting in not enough absorption of vitamin B12. Having a surgery in which part of the stomach or small intestine is removed. Taking certain medicines that make it hard for the body to absorb vitamin B12. These include: Heartburn medicines, such as antacids and proton pump inhibitors. Some medicines that are used to treat diabetes. What increases the risk? The following factors may make you more likely to develop a vitamin B12 deficiency: Being an older adult. Eating a vegetarian or vegan diet that does not include any foods that come from animals. Eating a poor diet while you are pregnant. Taking certain medicines. Having alcoholism. What are the signs or symptoms? In some cases, there are no symptoms of this condition. If the condition leads to anemia or nerve damage, various symptoms may occur, such as: Weakness. Tiredness (fatigue). Loss of appetite. Numbness or tingling in your hands and feet. Redness and burning of the tongue. Depression,  confusion, or memory problems. Trouble walking. If anemia is severe, symptoms can include: Shortness of breath. Dizziness. Rapid heart rate. How is this diagnosed? This condition may be diagnosed with a blood test to measure the level of vitamin B12 in your blood. You may also have other tests, including: A group of tests that measure certain characteristics of blood cells (complete blood count, CBC). A blood test to measure intrinsic factor. A procedure where a thin tube with a camera on the end is used to look into your stomach or intestines (endoscopy). Other tests may be needed to discover the cause of the deficiency. How is this treated? Treatment for this condition depends on the cause. This condition may be treated by: Changing your eating and drinking habits, such as: Eating more foods that contain vitamin B12. Drinking less alcohol or no alcohol. Getting vitamin B12 injections. Taking vitamin B12 supplements by mouth (orally). Your health care provider will tell you which dose is best for you. Follow these instructions at home: Eating and drinking  Include foods in your diet that come from animals and contain a lot of vitamin B12. These include: Meats and poultry. This includes beef, pork, chicken, turkey, and organ meats, such as liver. Seafood. This includes clams, rainbow trout, salmon, tuna, and haddock. Eggs. Dairy foods such as milk, yogurt, and cheese. Eat foods that have vitamin B12 added to them (are fortified), such as ready-to-eat breakfast cereals. Check the label on the package to see if a food is fortified. The items listed above may not be a complete list of foods and beverages you can eat and drink. Contact a dietitian for   more information. Alcohol use Do not drink alcohol if: Your health care provider tells you not to drink. You are pregnant, may be pregnant, or are planning to become pregnant. If you drink alcohol: Limit how much you have to: 0-1 drink a  day for women. 0-2 drinks a day for men. Know how much alcohol is in your drink. In the [place] one drink equals one 12 oz bottle of beer (355 mL), one 5 oz glass of wine (148 mL), or one 1 oz glass of hard liquor (44 mL). General instructions Get vitamin B12 injections if told to by your health care provider. Take supplements only as told by your health care provider. Follow the directions carefully. Keep all follow-up visits. This is important. Contact a health care provider if: Your symptoms come back. Your symptoms get worse or do not improve with treatment. Get help right away: You develop shortness of breath. You have a rapid heart rate. You have chest pain. You become dizzy or you faint. These symptoms may be an emergency. Get help right away. Call 911. Do not wait to see if the symptoms will go away. Do not drive yourself to the hospital. Summary Vitamin B12 deficiency occurs when the body does not have enough of this important vitamin. Common causes include not eating enough foods that contain vitamin B12, not being able to absorb vitamin B12 from the food that you eat, having a surgery in which part of the stomach or small intestine is removed, or taking certain medicines. Eat foods that have vitamin B12 in them. Treatment may include making a change in the way you eat and drink, getting vitamin B12 injections, or taking vitamin B12 supplements. This information is not intended to replace advice given to you by your health care provider. Make sure you discuss any questions you have with your health care provider. Document Revised: 08/18/2020 Document Reviewed: 08/18/2020 Elsevier Patient Education  2023 Elsevier Inc.  

## 2022-04-10 ENCOUNTER — Inpatient Hospital Stay: Payer: Medicare (Managed Care) | Attending: Hematology

## 2022-04-10 ENCOUNTER — Other Ambulatory Visit: Payer: Self-pay

## 2022-04-10 DIAGNOSIS — E538 Deficiency of other specified B group vitamins: Secondary | ICD-10-CM | POA: Insufficient documentation

## 2022-04-10 MED ORDER — CYANOCOBALAMIN 1000 MCG/ML IJ SOLN
1000.0000 ug | Freq: Once | INTRAMUSCULAR | Status: AC
  Administered 2022-04-10: 1000 ug via SUBCUTANEOUS
  Filled 2022-04-10: qty 1

## 2022-05-08 ENCOUNTER — Other Ambulatory Visit: Payer: Self-pay

## 2022-05-08 ENCOUNTER — Inpatient Hospital Stay: Payer: Medicare (Managed Care) | Attending: Hematology

## 2022-05-08 VITALS — BP 179/90 | HR 86 | Temp 98.2°F | Resp 18

## 2022-05-08 DIAGNOSIS — E538 Deficiency of other specified B group vitamins: Secondary | ICD-10-CM | POA: Diagnosis present

## 2022-05-08 MED ORDER — CYANOCOBALAMIN 1000 MCG/ML IJ SOLN
1000.0000 ug | INTRAMUSCULAR | Status: DC
  Administered 2022-05-08: 1000 ug via SUBCUTANEOUS
  Filled 2022-05-08: qty 1

## 2022-05-08 NOTE — Patient Instructions (Signed)
Vitamin B12 Deficiency Vitamin B12 deficiency occurs when the body does not have enough of this important vitamin. The body needs this vitamin: To make red blood cells. To make DNA. This is the genetic material inside cells. To help the nerves work properly so they can carry messages from the brain to the body. Vitamin B12 deficiency can cause health problems, such as not having enough red blood cells in the blood (anemia). This can lead to nerve damage if untreated. What are the causes? This condition may be caused by: Not eating enough foods that contain vitamin B12. Not having enough stomach acid and digestive fluids to properly absorb vitamin B12 from the food that you eat. Having certain diseases that make it hard to absorb vitamin B12. These diseases include Crohn's disease, chronic pancreatitis, and cystic fibrosis. An autoimmune disorder in which the body does not make enough of a protein (intrinsic factor) within the stomach, resulting in not enough absorption of vitamin B12. Having a surgery in which part of the stomach or small intestine is removed. Taking certain medicines that make it hard for the body to absorb vitamin B12. These include: Heartburn medicines, such as antacids and proton pump inhibitors. Some medicines that are used to treat diabetes. What increases the risk? The following factors may make you more likely to develop a vitamin B12 deficiency: Being an older adult. Eating a vegetarian or vegan diet that does not include any foods that come from animals. Eating a poor diet while you are pregnant. Taking certain medicines. Having alcoholism. What are the signs or symptoms? In some cases, there are no symptoms of this condition. If the condition leads to anemia or nerve damage, various symptoms may occur, such as: Weakness. Tiredness (fatigue). Loss of appetite. Numbness or tingling in your hands and feet. Redness and burning of the tongue. Depression,  confusion, or memory problems. Trouble walking. If anemia is severe, symptoms can include: Shortness of breath. Dizziness. Rapid heart rate. How is this diagnosed? This condition may be diagnosed with a blood test to measure the level of vitamin B12 in your blood. You may also have other tests, including: A group of tests that measure certain characteristics of blood cells (complete blood count, CBC). A blood test to measure intrinsic factor. A procedure where a thin tube with a camera on the end is used to look into your stomach or intestines (endoscopy). Other tests may be needed to discover the cause of the deficiency. How is this treated? Treatment for this condition depends on the cause. This condition may be treated by: Changing your eating and drinking habits, such as: Eating more foods that contain vitamin B12. Drinking less alcohol or no alcohol. Getting vitamin B12 injections. Taking vitamin B12 supplements by mouth (orally). Your health care provider will tell you which dose is best for you. Follow these instructions at home: Eating and drinking  Include foods in your diet that come from animals and contain a lot of vitamin B12. These include: Meats and poultry. This includes beef, pork, chicken, turkey, and organ meats, such as liver. Seafood. This includes clams, rainbow trout, salmon, tuna, and haddock. Eggs. Dairy foods such as milk, yogurt, and cheese. Eat foods that have vitamin B12 added to them (are fortified), such as ready-to-eat breakfast cereals. Check the label on the package to see if a food is fortified. The items listed above may not be a complete list of foods and beverages you can eat and drink. Contact a dietitian for   more information. Alcohol use Do not drink alcohol if: Your health care provider tells you not to drink. You are pregnant, may be pregnant, or are planning to become pregnant. If you drink alcohol: Limit how much you have to: 0-1 drink a  day for women. 0-2 drinks a day for men. Know how much alcohol is in your drink. In the [place] one drink equals one 12 oz bottle of beer (355 mL), one 5 oz glass of wine (148 mL), or one 1 oz glass of hard liquor (44 mL). General instructions Get vitamin B12 injections if told to by your health care provider. Take supplements only as told by your health care provider. Follow the directions carefully. Keep all follow-up visits. This is important. Contact a health care provider if: Your symptoms come back. Your symptoms get worse or do not improve with treatment. Get help right away: You develop shortness of breath. You have a rapid heart rate. You have chest pain. You become dizzy or you faint. These symptoms may be an emergency. Get help right away. Call 911. Do not wait to see if the symptoms will go away. Do not drive yourself to the hospital. Summary Vitamin B12 deficiency occurs when the body does not have enough of this important vitamin. Common causes include not eating enough foods that contain vitamin B12, not being able to absorb vitamin B12 from the food that you eat, having a surgery in which part of the stomach or small intestine is removed, or taking certain medicines. Eat foods that have vitamin B12 in them. Treatment may include making a change in the way you eat and drink, getting vitamin B12 injections, or taking vitamin B12 supplements. This information is not intended to replace advice given to you by your health care provider. Make sure you discuss any questions you have with your health care provider. Document Revised: 08/18/2020 Document Reviewed: 08/18/2020 Elsevier Patient Education  2023 Elsevier Inc.  

## 2022-05-24 ENCOUNTER — Other Ambulatory Visit: Payer: Self-pay | Admitting: Internal Medicine

## 2022-05-24 DIAGNOSIS — I25110 Atherosclerotic heart disease of native coronary artery with unstable angina pectoris: Secondary | ICD-10-CM

## 2022-06-12 ENCOUNTER — Other Ambulatory Visit: Payer: Self-pay

## 2022-06-12 ENCOUNTER — Inpatient Hospital Stay: Payer: Medicare (Managed Care) | Attending: Hematology

## 2022-06-12 VITALS — BP 179/85 | HR 88 | Temp 98.8°F | Resp 16

## 2022-06-12 DIAGNOSIS — E538 Deficiency of other specified B group vitamins: Secondary | ICD-10-CM | POA: Insufficient documentation

## 2022-06-12 MED ORDER — CYANOCOBALAMIN 1000 MCG/ML IJ SOLN
1000.0000 ug | INTRAMUSCULAR | Status: DC
  Administered 2022-06-12: 1000 ug via SUBCUTANEOUS
  Filled 2022-06-12: qty 1

## 2022-06-14 ENCOUNTER — Other Ambulatory Visit: Payer: Self-pay | Admitting: Internal Medicine

## 2022-07-10 ENCOUNTER — Ambulatory Visit: Payer: Medicare (Managed Care)

## 2022-07-17 ENCOUNTER — Inpatient Hospital Stay: Payer: Medicare (Managed Care) | Attending: Hematology

## 2022-07-17 ENCOUNTER — Other Ambulatory Visit: Payer: Self-pay

## 2022-07-17 DIAGNOSIS — E538 Deficiency of other specified B group vitamins: Secondary | ICD-10-CM | POA: Insufficient documentation

## 2022-07-17 MED ORDER — CYANOCOBALAMIN 1000 MCG/ML IJ SOLN
1000.0000 ug | INTRAMUSCULAR | Status: DC
  Administered 2022-07-17: 1000 ug via SUBCUTANEOUS
  Filled 2022-07-17: qty 1

## 2022-07-17 NOTE — Patient Instructions (Signed)

## 2022-08-14 ENCOUNTER — Ambulatory Visit: Payer: Medicare (Managed Care)

## 2022-08-21 ENCOUNTER — Other Ambulatory Visit: Payer: Self-pay

## 2022-08-21 ENCOUNTER — Inpatient Hospital Stay: Payer: Medicare (Managed Care) | Attending: Hematology

## 2022-08-21 DIAGNOSIS — E538 Deficiency of other specified B group vitamins: Secondary | ICD-10-CM | POA: Insufficient documentation

## 2022-08-21 MED ORDER — CYANOCOBALAMIN 1000 MCG/ML IJ SOLN
1000.0000 ug | INTRAMUSCULAR | Status: DC
  Administered 2022-08-21: 1000 ug via SUBCUTANEOUS
  Filled 2022-08-21: qty 1

## 2022-09-11 ENCOUNTER — Ambulatory Visit: Payer: Medicare (Managed Care)

## 2022-09-18 ENCOUNTER — Inpatient Hospital Stay: Payer: Medicare (Managed Care) | Attending: Hematology

## 2022-09-18 ENCOUNTER — Encounter: Payer: Self-pay | Admitting: Hematology

## 2022-09-18 VITALS — BP 178/82 | HR 92 | Resp 16

## 2022-09-18 DIAGNOSIS — E538 Deficiency of other specified B group vitamins: Secondary | ICD-10-CM | POA: Diagnosis present

## 2022-09-18 MED ORDER — CYANOCOBALAMIN 1000 MCG/ML IJ SOLN
1000.0000 ug | INTRAMUSCULAR | Status: DC
  Administered 2022-09-18: 1000 ug via SUBCUTANEOUS
  Filled 2022-09-18: qty 1

## 2022-09-18 NOTE — Patient Instructions (Signed)
 Vitamin B12 Injection What is this medication? Vitamin B12 (VAHY tuh min B12) prevents and treats low vitamin B12 levels in your body. It is used in people who do not get enough vitamin B12 from their diet or when their digestive tract does not absorb enough. Vitamin B12 plays an important role in maintaining the health of your nervous system and red blood cells. This medicine may be used for other purposes; ask your health care provider or pharmacist if you have questions. COMMON BRAND NAME(S): B-12 Compliance Kit, B-12 Injection Kit, Cyomin, Dodex, LA-12, Nutri-Twelve, Physicians EZ Use B-12, Primabalt, Vitamin Deficiency Injectable System - B12 What should I tell my care team before I take this medication? They need to know if you have any of these conditions: Kidney disease Leber's disease Megaloblastic anemia An unusual or allergic reaction to cyanocobalamin, cobalt, other medications, foods, dyes, or preservatives Pregnant or trying to get pregnant Breast-feeding How should I use this medication? This medication is injected into a muscle or deeply under the skin. It is usually given in a clinic or care team's office. However, your care team may teach you how to inject yourself. Follow all instructions. Talk to your care team about the use of this medication in children. Special care may be needed. Overdosage: If you think you have taken too much of this medicine contact a poison control center or emergency room at once. NOTE: This medicine is only for you. Do not share this medicine with others. What if I miss a dose? If you are given your dose at a clinic or care team's office, call to reschedule your appointment. If you give your own injections, and you miss a dose, take it as soon as you can. If it is almost time for your next dose, take only that dose. Do not take double or extra doses. What may interact with this medication? Alcohol Colchicine This list may not describe all possible  interactions. Give your health care provider a list of all the medicines, herbs, non-prescription drugs, or dietary supplements you use. Also tell them if you smoke, drink alcohol, or use illegal drugs. Some items may interact with your medicine. What should I watch for while using this medication? Visit your care team regularly. You may need blood work done while you are taking this medication. You may need to follow a special diet. Talk to your care team. Limit your alcohol intake and avoid smoking to get the best benefit. What side effects may I notice from receiving this medication? Side effects that you should report to your care team as soon as possible: Allergic reactions--skin rash, itching, hives, swelling of the face, lips, tongue, or throat Swelling of the ankles, hands, or feet Trouble breathing Side effects that usually do not require medical attention (report to your care team if they continue or are bothersome): Diarrhea This list may not describe all possible side effects. Call your doctor for medical advice about side effects. You may report side effects to FDA at 1-800-FDA-1088. Where should I keep my medication? Keep out of the reach of children. Store at room temperature between 15 and 30 degrees C (59 and 85 degrees F). Protect from light. Throw away any unused medication after the expiration date. NOTE: This sheet is a summary. It may not cover all possible information. If you have questions about this medicine, talk to your doctor, pharmacist, or health care provider.  2024 Elsevier/Gold Standard (2020-09-05 00:00:00)

## 2022-10-09 ENCOUNTER — Encounter: Payer: Self-pay | Admitting: Hematology

## 2022-10-09 ENCOUNTER — Ambulatory Visit: Payer: Medicare (Managed Care)

## 2022-10-16 ENCOUNTER — Inpatient Hospital Stay: Payer: Medicare (Managed Care) | Attending: Hematology

## 2022-10-16 VITALS — BP 164/77 | HR 77 | Temp 98.0°F | Resp 17

## 2022-10-16 DIAGNOSIS — E538 Deficiency of other specified B group vitamins: Secondary | ICD-10-CM | POA: Insufficient documentation

## 2022-10-16 MED ORDER — CYANOCOBALAMIN 1000 MCG/ML IJ SOLN
1000.0000 ug | INTRAMUSCULAR | Status: DC
  Administered 2022-10-16: 1000 ug via SUBCUTANEOUS
  Filled 2022-10-16: qty 1

## 2022-11-13 ENCOUNTER — Ambulatory Visit: Payer: Medicare (Managed Care)

## 2022-11-20 ENCOUNTER — Inpatient Hospital Stay: Payer: Medicare (Managed Care) | Attending: Hematology

## 2022-11-20 VITALS — BP 171/77 | HR 91 | Temp 98.2°F | Resp 17

## 2022-11-20 DIAGNOSIS — E538 Deficiency of other specified B group vitamins: Secondary | ICD-10-CM | POA: Diagnosis present

## 2022-11-20 MED ORDER — CYANOCOBALAMIN 1000 MCG/ML IJ SOLN
1000.0000 ug | INTRAMUSCULAR | Status: DC
  Administered 2022-11-20: 1000 ug via SUBCUTANEOUS
  Filled 2022-11-20: qty 1

## 2022-12-11 ENCOUNTER — Ambulatory Visit: Payer: Medicare (Managed Care)

## 2022-12-18 ENCOUNTER — Inpatient Hospital Stay: Payer: Medicare (Managed Care) | Attending: Hematology

## 2022-12-18 VITALS — BP 164/79 | HR 92 | Temp 98.3°F | Resp 18

## 2022-12-18 DIAGNOSIS — E538 Deficiency of other specified B group vitamins: Secondary | ICD-10-CM | POA: Insufficient documentation

## 2022-12-18 MED ORDER — CYANOCOBALAMIN 1000 MCG/ML IJ SOLN
1000.0000 ug | Freq: Once | INTRAMUSCULAR | Status: AC
  Administered 2022-12-18: 1000 ug via SUBCUTANEOUS
  Filled 2022-12-18: qty 1

## 2023-01-04 ENCOUNTER — Encounter: Payer: Self-pay | Admitting: Hematology

## 2023-01-10 ENCOUNTER — Other Ambulatory Visit: Payer: Medicare (Managed Care)

## 2023-01-10 ENCOUNTER — Ambulatory Visit: Payer: Medicare (Managed Care)

## 2023-01-10 ENCOUNTER — Ambulatory Visit: Payer: Medicare (Managed Care) | Admitting: Hematology

## 2023-01-15 ENCOUNTER — Inpatient Hospital Stay: Payer: Medicare (Managed Care) | Attending: Hematology

## 2023-01-15 ENCOUNTER — Inpatient Hospital Stay (HOSPITAL_BASED_OUTPATIENT_CLINIC_OR_DEPARTMENT_OTHER): Payer: Medicare (Managed Care) | Admitting: Hematology

## 2023-01-15 ENCOUNTER — Other Ambulatory Visit: Payer: Self-pay

## 2023-01-15 ENCOUNTER — Inpatient Hospital Stay: Payer: Medicare (Managed Care)

## 2023-01-15 VITALS — BP 175/79 | HR 96 | Temp 97.9°F | Resp 18 | Ht 72.0 in | Wt 178.1 lb

## 2023-01-15 DIAGNOSIS — D696 Thrombocytopenia, unspecified: Secondary | ICD-10-CM | POA: Insufficient documentation

## 2023-01-15 DIAGNOSIS — E538 Deficiency of other specified B group vitamins: Secondary | ICD-10-CM | POA: Diagnosis present

## 2023-01-15 DIAGNOSIS — E1122 Type 2 diabetes mellitus with diabetic chronic kidney disease: Secondary | ICD-10-CM | POA: Diagnosis not present

## 2023-01-15 DIAGNOSIS — N189 Chronic kidney disease, unspecified: Secondary | ICD-10-CM | POA: Insufficient documentation

## 2023-01-15 LAB — CBC WITH DIFFERENTIAL (CANCER CENTER ONLY)
Abs Immature Granulocytes: 0.02 10*3/uL (ref 0.00–0.07)
Basophils Absolute: 0 10*3/uL (ref 0.0–0.1)
Basophils Relative: 0 %
Eosinophils Absolute: 0.1 10*3/uL (ref 0.0–0.5)
Eosinophils Relative: 2 %
HCT: 36.1 % — ABNORMAL LOW (ref 39.0–52.0)
Hemoglobin: 11.7 g/dL — ABNORMAL LOW (ref 13.0–17.0)
Immature Granulocytes: 0 %
Lymphocytes Relative: 28 %
Lymphs Abs: 2 10*3/uL (ref 0.7–4.0)
MCH: 30 pg (ref 26.0–34.0)
MCHC: 32.4 g/dL (ref 30.0–36.0)
MCV: 92.6 fL (ref 80.0–100.0)
Monocytes Absolute: 0.7 10*3/uL (ref 0.1–1.0)
Monocytes Relative: 10 %
Neutro Abs: 4.3 10*3/uL (ref 1.7–7.7)
Neutrophils Relative %: 60 %
Platelet Count: 68 10*3/uL — ABNORMAL LOW (ref 150–400)
RBC: 3.9 MIL/uL — ABNORMAL LOW (ref 4.22–5.81)
RDW: 14 % (ref 11.5–15.5)
WBC Count: 7.1 10*3/uL (ref 4.0–10.5)
nRBC: 0 % (ref 0.0–0.2)

## 2023-01-15 LAB — CMP (CANCER CENTER ONLY)
ALT: 8 U/L (ref 0–44)
AST: 11 U/L — ABNORMAL LOW (ref 15–41)
Albumin: 4 g/dL (ref 3.5–5.0)
Alkaline Phosphatase: 82 U/L (ref 38–126)
Anion gap: 5 (ref 5–15)
BUN: 38 mg/dL — ABNORMAL HIGH (ref 8–23)
CO2: 25 mmol/L (ref 22–32)
Calcium: 9.3 mg/dL (ref 8.9–10.3)
Chloride: 114 mmol/L — ABNORMAL HIGH (ref 98–111)
Creatinine: 3.1 mg/dL — ABNORMAL HIGH (ref 0.61–1.24)
GFR, Estimated: 18 mL/min — ABNORMAL LOW (ref >60)
Glucose, Bld: 157 mg/dL — ABNORMAL HIGH (ref 70–99)
Potassium: 5.1 mmol/L (ref 3.5–5.1)
Sodium: 144 mmol/L (ref 135–145)
Total Bilirubin: 0.5 mg/dL (ref 0.0–1.2)
Total Protein: 6.9 g/dL (ref 6.5–8.1)

## 2023-01-15 LAB — FERRITIN: Ferritin: 167 ng/mL (ref 24–336)

## 2023-01-15 LAB — IRON AND IRON BINDING CAPACITY (CC-WL,HP ONLY)
Iron: 90 ug/dL (ref 45–182)
Saturation Ratios: 43 % — ABNORMAL HIGH (ref 17.9–39.5)
TIBC: 210 ug/dL — ABNORMAL LOW (ref 250–450)
UIBC: 120 ug/dL (ref 117–376)

## 2023-01-15 LAB — VITAMIN B12: Vitamin B-12: 798 pg/mL (ref 180–914)

## 2023-01-15 LAB — IMMATURE PLATELET FRACTION: Immature Platelet Fraction: 0.9 % — ABNORMAL LOW (ref 1.2–8.6)

## 2023-01-15 MED ORDER — CYANOCOBALAMIN 1000 MCG/ML IJ SOLN
1000.0000 ug | INTRAMUSCULAR | Status: DC
  Administered 2023-01-15: 1000 ug via SUBCUTANEOUS
  Filled 2023-01-15: qty 1
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
## 2023-01-21 ENCOUNTER — Encounter: Payer: Self-pay | Admitting: Hematology

## 2023-02-12 ENCOUNTER — Other Ambulatory Visit: Payer: Self-pay | Admitting: Internal Medicine

## 2023-02-12 DIAGNOSIS — I25110 Atherosclerotic heart disease of native coronary artery with unstable angina pectoris: Secondary | ICD-10-CM

## 2023-02-19 ENCOUNTER — Inpatient Hospital Stay: Payer: Medicare (Managed Care) | Attending: Hematology

## 2023-02-19 VITALS — BP 170/76 | HR 75 | Temp 98.8°F | Resp 16

## 2023-02-19 DIAGNOSIS — E538 Deficiency of other specified B group vitamins: Secondary | ICD-10-CM | POA: Diagnosis present

## 2023-02-19 MED ORDER — CYANOCOBALAMIN 1000 MCG/ML IJ SOLN
1000.0000 ug | Freq: Once | INTRAMUSCULAR | Status: AC
  Administered 2023-02-19: 1000 ug via SUBCUTANEOUS
  Filled 2023-02-19: qty 1

## 2023-03-19 ENCOUNTER — Inpatient Hospital Stay: Payer: Medicare (Managed Care) | Attending: Hematology

## 2023-03-19 DIAGNOSIS — E538 Deficiency of other specified B group vitamins: Secondary | ICD-10-CM | POA: Insufficient documentation

## 2023-03-19 MED ORDER — CYANOCOBALAMIN 1000 MCG/ML IJ SOLN
1000.0000 ug | INTRAMUSCULAR | Status: DC
  Administered 2023-03-19: 1000 ug via SUBCUTANEOUS
  Filled 2023-03-19: qty 1

## 2023-04-16 ENCOUNTER — Inpatient Hospital Stay: Payer: Medicare (Managed Care) | Attending: Hematology

## 2023-04-16 DIAGNOSIS — E538 Deficiency of other specified B group vitamins: Secondary | ICD-10-CM

## 2023-04-16 MED ORDER — CYANOCOBALAMIN 1000 MCG/ML IJ SOLN
1000.0000 ug | INTRAMUSCULAR | Status: DC
  Administered 2023-04-16: 1000 ug via SUBCUTANEOUS
  Filled 2023-04-16: qty 1

## 2023-05-06 ENCOUNTER — Ambulatory Visit: Payer: Medicare (Managed Care) | Attending: Internal Medicine | Admitting: Internal Medicine

## 2023-05-06 ENCOUNTER — Encounter: Payer: Self-pay | Admitting: Internal Medicine

## 2023-05-06 VITALS — BP 136/75 | HR 88 | Ht 71.0 in | Wt 173.0 lb

## 2023-05-06 DIAGNOSIS — I110 Hypertensive heart disease with heart failure: Secondary | ICD-10-CM

## 2023-05-06 DIAGNOSIS — I10 Essential (primary) hypertension: Secondary | ICD-10-CM

## 2023-05-06 DIAGNOSIS — I422 Other hypertrophic cardiomyopathy: Secondary | ICD-10-CM

## 2023-05-06 DIAGNOSIS — I872 Venous insufficiency (chronic) (peripheral): Secondary | ICD-10-CM | POA: Diagnosis not present

## 2023-05-06 NOTE — Patient Instructions (Signed)
 Medication Instructions:  Your physician recommends that you continue on your current medications as directed. Please refer to the Current Medication list given to you today.  *If you need a refill on your cardiac medications before your next appointment, please call your pharmacy*  Lab Work: NONE  If you have labs (blood work) drawn today and your tests are completely normal, you will receive your results only by: MyChart Message (if you have MyChart) OR A paper copy in the mail If you have any lab test that is abnormal or we need to change your treatment, we will call you to review the results.  Testing/Procedures: Your physician has requested that you have an echocardiogram. Echocardiography is a painless test that uses sound waves to create images of your heart. It provides your doctor with information about the size and shape of your heart and how well your heart's chambers and valves are working. This procedure takes approximately one hour. There are no restrictions for this procedure. Please do NOT wear cologne, perfume, aftershave, or lotions (deodorant is allowed). Please arrive 15 minutes prior to your appointment time.  Please note: We ask at that you not bring children with you during ultrasound (echo/ vascular) testing. Due to room size and safety concerns, children are not allowed in the ultrasound rooms during exams. Our front office staff cannot provide observation of children in our lobby area while testing is being conducted. An adult accompanying a patient to their appointment will only be allowed in the ultrasound room at the discretion of the ultrasound technician under special circumstances. We apologize for any inconvenience.   Follow-Up: At [facility], you and your health needs are our priority.  As part of our continuing mission to provide you with exceptional heart care, our providers are all part of one team.  This team includes your primary Cardiologist  (physician) and Advanced Practice Providers or APPs (Physician Assistants and Nurse Practitioners) who all work together to provide you with the care you need, when you need it.  Your next appointment:   6 month(s)  Provider:   Jann Melody, MD    We recommend signing up for the patient portal called "MyChart".  Sign up information is provided on this After Visit Summary.  MyChart is used to connect with patients for Virtual Visits (Telemedicine).  Patients are able to view lab/test results, encounter notes, upcoming appointments, etc.  Non-urgent messages can be sent to your provider as well.   To learn more about what you can do with MyChart, go to [email or web address].

## 2023-05-06 NOTE — Progress Notes (Signed)
 Cardiology Office Note:  .    Date:  05/06/2023  ID:  Robert Clayton, DOB [birth year], MRN [id number] PCP: Virgle Grime, MD  [facility] Providers Cardiologist:  Jann Melody, MD     CC: Get checked out Consulted for the evaluation of CAD at the behest of Dr. Gloria Lares   History of Present Illness: .    Robert Clayton is a 89 y.o. male with coronary artery disease and chronic diastolic heart failure who presents for reestablishment of cardiac care. Previously seen by Dr. Arlester Ladd.  He has not seen his previous cardiologist for about five years and is seeking a new cardiologist for regular check-ups, ideally once a year. He feels generally well but experiences fatigue and shortness of breath with exertion, such as walking to the mailbox or cutting grass. He gets tired when walking and occasionally feels short of breath, particularly after breakfast or when standing up quickly. No significant chest pain, with any discomfort described as minor and infrequent.  He has a history of coronary artery disease with a prior PCI of the left circumflex vessel and diffuse small vessel disease. He is not a candidate for further invasive procedures due to thrombocytopenia and small vessel disease. He also has CKD stage four, which complicates potential interventions. An echocardiogram from 2022 showed an LVOT gradient of 42, but the study is not currently accessible.  He reports a history of a heart murmur and a leaky heart valve, noted during a period of high blood pressure in 2022. He has lost about fifty pounds, which he attributes to his diet, consisting of small meals like 'one piece of toast and half a piece of bacon.'  In terms of social history, he mentions that he has five children, all of whom are retired.  Discussed the use of AI scribe software for clinical note transcription with the patient, who gave verbal consent to proceed.   Relevant histories: .  Social- comes  with wife: 5 children have all retired.   ROS: As per HPI.   Studies Reviewed: .   Cardiac Studies & Procedures   ______________________________________________________________________________________________   STRESS TESTS  MYOCARDIAL PERFUSION IMAGING 09/07/2015  Narrative  The left ventricular ejection fraction is normal (55-65%).  Nuclear stress EF: 56%.  There was no ST segment deviation noted during stress.  Defect 1: There is a medium defect of mild severity present in the basal inferior, mid inferior, mid inferolateral and apical inferior location.  Findings consistent with ischemia.  This is an intermediate risk study.  There is a medium size, mild severity reversible defect in the basal, mid and apical inferior and mid inferolateral walls consistent with ischemia (SDS =4).   ECHOCARDIOGRAM  ECHOCARDIOGRAM COMPLETE 09/14/2020  Narrative ECHOCARDIOGRAM REPORT    Patient Name:   Robert Clayton Date of Exam: 09/14/2020 Medical Rec #:  [id number]    Height:       71.5 in Accession #:    [id number]   Weight:       193.1 lb Date of Birth:  01-26-1930   BSA:          2.088 m Patient Age:    89 years     BP:           141/71 mmHg Patient Gender: M            HR:           55 bpm. Exam Location:  Inpatient  Procedure: 2D Echo, Cardiac Doppler, Color Doppler  and Intracardiac Opacification Agent  Indications:    Chest Pain R07.9  History:        Patient has prior history of Echocardiogram examinations, most recent 12/26/2017. CHF, CAD, PAD; Risk Factors:Hypertension and Dyslipidemia. Chronic kidney disease. COVID positive.  Sonographer:    Konnie Perry RDCS Referring Phys: [id number] ARSHAD N KAKRAKANDY  IMPRESSIONS   1. Left ventricular ejection fraction, by estimation, is 65 to 70%. The left ventricle has normal function. The left ventricle has no regional wall motion abnormalities. There is severe asymmetric left ventricular hypertrophy of the basal-septal segment.  LVOT peak gradient measuring up to 2. Right ventricular systolic function is normal. The right ventricular size is normal. Tricuspid regurgitation signal is inadequate for assessing PA pressure. 3. The mitral valve is abnormal. Trivial mitral valve regurgitation. No evidence of mitral stenosis. Moderate mitral annular calcification. 4. The aortic valve was not well visualized. Aortic valve regurgitation is trivial. Mild to moderate aortic valve sclerosis/calcification is present, without any evidence of aortic stenosis. 5. The inferior vena cava is normal in size with greater than 50% respiratory variability, suggesting right atrial pressure of 3 mmHg.  FINDINGS Left Ventricle: Left ventricular ejection fraction, by estimation, is 65 to 70%. The left ventricle has normal function. The left ventricle has no regional wall motion abnormalities. Definity  contrast agent was given IV to delineate the left ventricular endocardial borders. The left ventricular internal cavity size was normal in size. There is severe asymmetric left ventricular hypertrophy of the basal-septal segment. Left ventricular diastolic parameters are indeterminate.  Right Ventricle: The right ventricular size is normal. No increase in right ventricular wall thickness. Right ventricular systolic function is normal. Tricuspid regurgitation signal is inadequate for assessing PA pressure. The tricuspid regurgitant velocity is 1.35 m/s, and with an assumed right atrial pressure of 3 mmHg, the estimated right ventricular systolic pressure is 10.3 mmHg.  Left Atrium: Left atrial size was normal in size.  Right Atrium: Right atrial size was normal in size.  Pericardium: There is no evidence of pericardial effusion.  Mitral Valve: The mitral valve is abnormal. Moderate mitral annular calcification. Trivial mitral valve regurgitation. No evidence of mitral valve stenosis.  Tricuspid Valve: The tricuspid valve is normal in  structure. Tricuspid valve regurgitation is trivial.  Aortic Valve: The aortic valve was not well visualized. Aortic valve regurgitation is trivial. Mild to moderate aortic valve sclerosis/calcification is present, without any evidence of aortic stenosis.  Pulmonic Valve: The pulmonic valve was not well visualized. Pulmonic valve regurgitation is mild.  Aorta: The aortic root and ascending aorta are structurally normal, with no evidence of dilitation.  Venous: The inferior vena cava is normal in size with greater than 50% respiratory variability, suggesting right atrial pressure of 3 mmHg.  IAS/Shunts: The interatrial septum was not well visualized.   LEFT VENTRICLE PLAX 2D LVIDd:         3.80 cm  Diastology LVIDs:         2.20 cm  LV e' medial:    4.59 cm/s LV PW:         1.40 cm  LV E/e' medial:  18.8 LV IVS:        1.60 cm  LV e' lateral:   5.87 cm/s LVOT diam:     2.05 cm  LV E/e' lateral: 14.7 LV SV:         136 LV SV Index:   65 LVOT Area:     3.30 cm   RIGHT VENTRICLE RV  S prime:     11.40 cm/s TAPSE (M-mode): 1.6 cm  LEFT ATRIUM             Index       RIGHT ATRIUM           Index LA diam:        3.30 cm 1.58 cm/m  RA Area:     11.90 cm LA Vol (A2C):   45.0 ml 21.55 ml/m RA Volume:   26.20 ml  12.55 ml/m LA Vol (A4C):   46.9 ml 22.46 ml/m LA Biplane Vol: 47.2 ml 22.60 ml/m AORTIC VALVE              PULMONIC VALVE LVOT Vmax:   192.00 cm/s  PV Vmax:          1.83 m/s LVOT Vmean:  104.000 cm/s PV Peak grad:     13.4 mmHg LVOT VTI:    0.411 m      PR End Diast Vel: 4.49 msec  AORTA Ao Root diam: 3.80 cm Ao Asc diam:  3.50 cm  MITRAL VALVE                TRICUSPID VALVE MV Area (PHT): 2.45 cm     TR Peak grad:   7.3 mmHg MV Decel Time: 310 msec     TR Vmax:        135.00 cm/s MV E velocity: 86.20 cm/s MV A velocity: 102.00 cm/s  SHUNTS MV E/A ratio:  0.85         Systemic VTI:  0.41 m Systemic Diam: 2.05 cm  Carson Clara MD Electronically signed  by Carson Clara MD Signature Date/Time: 09/14/2020/5:49:39 PM    Final          ______________________________________________________________________________________________      Physical Exam:    VS:  BP 136/75 (BP Location: Left Arm)   Pulse 88   Ht 5\' 11"  (1.803 m)   Wt 78.5 kg   SpO2 97%   BMI 24.13 kg/m    Wt Readings from Last 3 Encounters:  05/06/23 78.5 kg  01/15/23 80.8 kg  01/09/22 83.2 kg    Gen: no distress, elderly male   HEENT: No JVD; Prounced nose and ear lobules, Full lips Cardiac: No Rubs or Gallops, Systolic Murmur, cardia, +1 radial pulses Respiratory: Clear to auscultation bilaterally, normal effort, normal  respiratory rate GI: Soft, nontender, non-distended  MS: No  edema;  moves all extremities Integument: Skin feels warm, no dark red spots on face Neuro:  At time of evaluation, alert and oriented to person/place/time/situation  Psych: Normal affect, patient feels    ASSESSMENT AND PLAN: .     Coronary artery disease with history of PCI - Coronary artery disease with prior PCI of the left circumflex vessel. Not a candidate for further left heart catheterization due to thrombocytopenia and small vessel disease. Currently asymptomatic with rare angina and able to perform activities such as cutting grass. Medical management is prioritized due to his age and current functional status.  Hypertension - Hypertension management is crucial given the coronary artery disease and heart murmur. Symptoms include occasional dyspnea and minor angina, but overall he is doing well and able to perform daily activities with minimal limitations. - Consider increasing metoprolol  dose based on echocardiogram results. - continue current medications  Hypertrophic Cardiomyopathy vs BSH of aging,vs Fabry's Syndrome - Heart murmur likely related to aortic stenosis with a previously reported LVOT gradient of 42. Symptoms include occasional dyspnea  and minor  angina, but overall functional status is good. The 2022 echocardiogram is outdated, necessitating a repeat study. His age and current health status are considered in the decision to monitor rather than intervene aggressively. - Order echocardiogram in 6 months. - Screen his children for potential hereditary cardiac conditions if echocardiogram confirms findings (HCM vs Fabry's genetic testing at next visit) - may increase BB  Peripheral arterial disease Peripheral arterial disease with some blockages. Not a candidate for typical antiplatelet therapy due to thrombocytopenia. Currently doing relatively well with no significant symptoms reported. Medical management is limited by thrombocytopenia.       Gloriann Larger, MD FASE [facility] Cardiologist [facility]  [address] [place], [address] [place], [place] [address] [phone]  4:19 PM

## 2023-05-21 ENCOUNTER — Inpatient Hospital Stay: Payer: Medicare (Managed Care) | Attending: Hematology

## 2023-05-21 DIAGNOSIS — E538 Deficiency of other specified B group vitamins: Secondary | ICD-10-CM | POA: Insufficient documentation

## 2023-05-21 MED ORDER — CYANOCOBALAMIN 1000 MCG/ML IJ SOLN
1000.0000 ug | INTRAMUSCULAR | Status: DC
  Administered 2023-05-21: 1000 ug via SUBCUTANEOUS
  Filled 2023-05-21: qty 1

## 2023-06-10 ENCOUNTER — Ambulatory Visit (HOSPITAL_COMMUNITY)
Admission: RE | Admit: 2023-06-10 | Discharge: 2023-06-10 | Disposition: A | Discharge status: Home / Self Care | Payer: Medicare (Managed Care) | Source: Ambulatory Visit | Attending: Cardiovascular Disease | Admitting: Cardiovascular Disease

## 2023-06-10 DIAGNOSIS — I422 Other hypertrophic cardiomyopathy: Secondary | ICD-10-CM | POA: Diagnosis present

## 2023-06-10 LAB — ECHOCARDIOGRAM COMPLETE
AR max vel: 2.95 cm2
AV Area VTI: 3.2 cm2
AV Area mean vel: 2.89 cm2
AV Mean grad: 10 mmHg
AV Peak grad: 15.4 mmHg
Ao pk vel: 1.96 m/s
Area-P 1/2: 2.54 cm2
MV M vel: 6.17 m/s
MV Peak grad: 152 mmHg
P 1/2 time: 499 ms
S' Lateral: 1.7 cm

## 2023-06-12 ENCOUNTER — Ambulatory Visit: Payer: Self-pay

## 2023-06-12 MED ORDER — METOPROLOL TARTRATE 100 MG PO TABS: 100.0000 mg | ORAL_TABLET | Freq: Two times a day (BID) | ORAL | 3 refills | Status: AC

## 2023-06-12 NOTE — Telephone Encounter (Signed)
 The patient has been notified of the result and verbalized understanding.  All questions (if any) were answered. Sima Du Faren Florence, RN 06/12/2023 3:24 PM   Pt expresses understanding he will speak with children about getting screened for HOCM.

## 2023-06-18 ENCOUNTER — Inpatient Hospital Stay: Payer: Medicare (Managed Care) | Attending: Hematology

## 2023-06-18 DIAGNOSIS — E538 Deficiency of other specified B group vitamins: Secondary | ICD-10-CM | POA: Insufficient documentation

## 2023-06-18 MED ORDER — CYANOCOBALAMIN 1000 MCG/ML IJ SOLN
1000.0000 ug | INTRAMUSCULAR | Status: DC
  Administered 2023-06-18: 1000 ug via SUBCUTANEOUS
  Filled 2023-06-18: qty 1

## 2023-07-16 ENCOUNTER — Inpatient Hospital Stay: Payer: Medicare (Managed Care) | Attending: Hematology

## 2023-07-16 DIAGNOSIS — E538 Deficiency of other specified B group vitamins: Secondary | ICD-10-CM | POA: Insufficient documentation

## 2023-07-16 MED ORDER — CYANOCOBALAMIN 1000 MCG/ML IJ SOLN
1000.0000 ug | INTRAMUSCULAR | Status: DC
  Administered 2023-07-16: 1000 ug via SUBCUTANEOUS
  Filled 2023-07-16: qty 1

## 2023-08-03 ENCOUNTER — Other Ambulatory Visit: Payer: Self-pay | Admitting: Internal Medicine

## 2023-08-03 DIAGNOSIS — I25110 Atherosclerotic heart disease of native coronary artery with unstable angina pectoris: Secondary | ICD-10-CM

## 2023-08-20 ENCOUNTER — Inpatient Hospital Stay: Payer: Medicare (Managed Care) | Attending: Hematology

## 2023-08-20 DIAGNOSIS — E538 Deficiency of other specified B group vitamins: Secondary | ICD-10-CM | POA: Insufficient documentation

## 2023-08-20 MED ORDER — CYANOCOBALAMIN 1000 MCG/ML IJ SOLN
1000.0000 ug | INTRAMUSCULAR | Status: DC
  Administered 2023-08-20 (×2): 1000 ug via SUBCUTANEOUS
  Filled 2023-08-20: qty 1

## 2023-09-15 ENCOUNTER — Other Ambulatory Visit: Payer: Self-pay | Admitting: Internal Medicine

## 2023-09-15 DIAGNOSIS — I25110 Atherosclerotic heart disease of native coronary artery with unstable angina pectoris: Secondary | ICD-10-CM

## 2023-09-17 ENCOUNTER — Inpatient Hospital Stay: Payer: Medicare (Managed Care) | Attending: Hematology

## 2023-09-17 DIAGNOSIS — E538 Deficiency of other specified B group vitamins: Secondary | ICD-10-CM | POA: Diagnosis present

## 2023-09-17 MED ORDER — CYANOCOBALAMIN 1000 MCG/ML IJ SOLN
1000.0000 ug | INTRAMUSCULAR | Status: DC
  Administered 2023-09-17: 1000 ug via SUBCUTANEOUS
  Filled 2023-09-17 (×2): qty 1

## 2023-10-15 ENCOUNTER — Inpatient Hospital Stay: Payer: Medicare (Managed Care) | Attending: Hematology

## 2023-10-15 DIAGNOSIS — E538 Deficiency of other specified B group vitamins: Secondary | ICD-10-CM | POA: Insufficient documentation

## 2023-10-15 MED ORDER — CYANOCOBALAMIN 1000 MCG/ML IJ SOLN
1000.0000 ug | INTRAMUSCULAR | Status: DC
  Administered 2023-10-15: 1000 ug via SUBCUTANEOUS
  Filled 2023-10-15: qty 1

## 2023-11-19 ENCOUNTER — Inpatient Hospital Stay: Payer: Medicare (Managed Care) | Attending: Hematology

## 2023-11-19 VITALS — BP 190/94 | HR 58 | Temp 97.9°F | Resp 18

## 2023-11-19 DIAGNOSIS — E538 Deficiency of other specified B group vitamins: Secondary | ICD-10-CM | POA: Diagnosis present

## 2023-11-19 MED ORDER — CYANOCOBALAMIN 1000 MCG/ML IJ SOLN
1000.0000 ug | Freq: Once | INTRAMUSCULAR | Status: AC
  Administered 2023-11-19: 1000 ug via SUBCUTANEOUS
  Filled 2023-11-19: qty 1

## 2023-11-19 NOTE — Patient Instructions (Signed)
 Vitamin B12 Injection What is this medication? Vitamin B12 (VAHY tuh min B12) prevents and treats low vitamin B12 levels in your body. It is used in people who do not get enough vitamin B12 from their diet or when their digestive tract does not absorb enough. Vitamin B12 plays an important role in maintaining the health of your nervous system and red blood cells. This medicine may be used for other purposes; ask your health care provider or pharmacist if you have questions. COMMON BRAND NAME(S): B-12 Compliance Kit, B-12 Injection Kit, Cyomin, Dodex , LA-12, Nutri-Twelve, Physicians EZ Use B-12, Primabalt, Vitamin Deficiency Injectable System - B12 What should I tell my care team before I take this medication? They need to know if you have any of these conditions: Kidney disease Leber's disease Megaloblastic anemia An unusual or allergic reaction to cyanocobalamin , cobalt, other medications, foods, dyes, or preservatives Pregnant or trying to get pregnant Breast-feeding How should I use this medication? This medication is injected into a muscle or deeply under the skin. It is usually given in a clinic or care team's office. However, your care team may teach you how to inject yourself. Follow all instructions. Talk to your care team about the use of this medication in children. Special care may be needed. Overdosage: If you think you have taken too much of this medicine contact a poison control center or emergency room at once. NOTE: This medicine is only for you. Do not share this medicine with others. What if I miss a dose? If you are given your dose at a clinic or care team's office, call to reschedule your appointment. If you give your own injections, and you miss a dose, take it as soon as you can. If it is almost time for your next dose, take only that dose. Do not take double or extra doses. What may interact with this medication? Alcohol Colchicine This list may not describe all possible  interactions. Give your health care provider a list of all the medicines, herbs, non-prescription drugs, or dietary supplements you use. Also tell them if you smoke, drink alcohol, or use illegal drugs. Some items may interact with your medicine. What should I watch for while using this medication? Visit your care team regularly. You may need blood work done while you are taking this medication. You may need to follow a special diet. Talk to your care team. Limit your alcohol intake and avoid smoking to get the best benefit. What side effects may I notice from receiving this medication? Side effects that you should report to your care team as soon as possible: Allergic reactions--skin rash, itching, hives, swelling of the face, lips, tongue, or throat Swelling of the ankles, hands, or feet Trouble breathing Side effects that usually do not require medical attention (report to your care team if they continue or are bothersome): Diarrhea This list may not describe all possible side effects. Call your doctor for medical advice about side effects. You may report side effects to FDA at 1-800-FDA-1088. Where should I keep my medication? Keep out of the reach of children. Store at room temperature between 15 and 30 degrees C (59 and 85 degrees F). Protect from light. Throw away any unused medication after the expiration date. NOTE: This sheet is a summary. It may not cover all possible information. If you have questions about this medicine, talk to your doctor, pharmacist, or health care provider.  2024 Elsevier/Gold Standard (2020-09-05 00:00:00)

## 2023-12-08 ENCOUNTER — Ambulatory Visit: Payer: Medicare (Managed Care) | Attending: Internal Medicine | Admitting: Internal Medicine

## 2023-12-08 ENCOUNTER — Ambulatory Visit: Payer: Medicare (Managed Care)

## 2023-12-08 VITALS — BP 173/82 | HR 56 | Ht 70.0 in | Wt 169.0 lb

## 2023-12-08 DIAGNOSIS — I422 Other hypertrophic cardiomyopathy: Secondary | ICD-10-CM | POA: Diagnosis not present

## 2023-12-08 DIAGNOSIS — I110 Hypertensive heart disease with heart failure: Secondary | ICD-10-CM

## 2023-12-08 DIAGNOSIS — R002 Palpitations: Secondary | ICD-10-CM

## 2023-12-08 DIAGNOSIS — I10 Essential (primary) hypertension: Secondary | ICD-10-CM

## 2023-12-08 DIAGNOSIS — I872 Venous insufficiency (chronic) (peripheral): Secondary | ICD-10-CM | POA: Diagnosis not present

## 2023-12-08 DIAGNOSIS — I25110 Atherosclerotic heart disease of native coronary artery with unstable angina pectoris: Secondary | ICD-10-CM

## 2023-12-08 MED ORDER — HYDROCHLOROTHIAZIDE 25 MG PO TABS: 25.0000 mg | ORAL_TABLET | Freq: Every day | ORAL | 3 refills | Status: AC

## 2023-12-08 NOTE — Progress Notes (Unsigned)
 Enrolled patient for a 7 day Zio XT monitor to be mailed to patients home.

## 2023-12-08 NOTE — Patient Instructions (Signed)
 Medication Instructions:   INCREASE hydrochlorothiazide  to 25mg  daily  Please STOP isosorbide    *If you need a refill on your cardiac medications before your next appointment, please call your pharmacy*  Lab Work:  Non-Fasting lab work in 1-2 weeks -- BMET If this is checked by nephrologist, please have results sent to our office  If you have labs (blood work) drawn today and your tests are completely normal, you will receive your results only by: MyChart Message (if you have MyChart) OR A paper copy in the mail If you have any lab test that is abnormal or we need to change your treatment, we will call you to review the results.  Testing/Procedures: GEOFFRY HEWS- Long Term Monitor Instructions  Your physician has requested you wear a ZIO patch monitor for 7 days.  This is a single patch monitor. [employer] supplies one patch monitor per enrollment. Additional stickers are not available. Please do not apply patch if you will be having a Nuclear Stress Test,  Echocardiogram, Cardiac CT, MRI, or Chest Xray during the period you would be wearing the  monitor. The patch cannot be worn during these tests. You cannot remove and re-apply the  ZIO XT patch monitor.  Your ZIO patch monitor will be mailed 3 day USPS to your address on file. It may take 3-5 days  to receive your monitor after you have been enrolled.  Once you have received your monitor, please review the enclosed instructions. Your monitor  has already been registered assigning a specific monitor serial # to you.  Billing and Patient Assistance Program Information  We have supplied [employer] with any of your insurance information on file for billing purposes. [employer] offers a sliding scale Patient Assistance Program for patients that do not have  insurance, or whose insurance does not completely cover the cost of the ZIO monitor.  You must apply for the Patient Assistance Program to qualify for this discounted rate.  To apply, please  call [employer] at [phone], select option 4, select option 2, ask to apply for  Patient Assistance Program. Meredeth will ask your household income, and how many people  are in your household. They will quote your out-of-pocket cost based on that information.  [employer] will also be able to set up a 86-month, interest-free payment plan if needed.  Applying the monitor   Shave hair from upper left chest.  Hold abrader disc by orange tab. Rub abrader in 40 strokes over the upper left chest as  indicated in your monitor instructions.  Clean area with 4 enclosed alcohol pads. Let dry.  Apply patch as indicated in monitor instructions. Patch will be placed under collarbone on left  side of chest with arrow pointing upward.  Rub patch adhesive wings for 2 minutes. Remove white label marked 1. Remove the white  label marked 2. Rub patch adhesive wings for 2 additional minutes.  While looking in a mirror, press and release button in center of patch. A small green light will  flash 3-4 times. This will be your only indicator that the monitor has been turned on.  Do not shower for the first 24 hours. You may shower after the first 24 hours.  Press the button if you feel a symptom. You will hear a small click. Record Date, Time and  Symptom in the Patient Logbook.  When you are ready to remove the patch, follow instructions on the last 2 pages of Patient  Logbook. Stick patch monitor onto the last page  of Patient Logbook.  Place Patient Logbook in the blue and white box. Use locking tab on box and tape box closed  securely. The blue and white box has prepaid postage on it. Please place it in the mailbox as  soon as possible. Your physician should have your test results approximately 7 days after the  monitor has been mailed back to [facility].  Call [facility] Customer Care at [phone] if you have questions regarding  your ZIO XT patch monitor. Call them immediately if you see an  orange light blinking on your  monitor.  If your monitor falls off in less than 4 days, contact our Monitor department at [phone].  If your monitor becomes loose or falls off after 4 days call [employer] at [phone] for  suggestions on securing your monitor   Follow-Up: At [facility], you and your health needs are our priority.  As part of our continuing mission to provide you with exceptional heart care, our providers are all part of one team.  This team includes your primary Cardiologist (physician) and Advanced Practice Providers or APPs (Physician Assistants and Nurse Practitioners) who all work together to provide you with the care you need, when you need it.  Your next appointment:    6 months with Dr. Santo or Orren Fabry PA  We recommend signing up for the patient portal called MyChart.  Sign up information is provided on this After Visit Summary.  MyChart is used to connect with patients for Virtual Visits (Telemedicine).  Patients are able to view lab/test results, encounter notes, upcoming appointments, etc.  Non-urgent messages can be sent to your provider as well.   To learn more about what you can do with MyChart, go to [email or web address].   Other Instructions

## 2023-12-08 NOTE — Progress Notes (Signed)
 Cardiology Office Note:  .    Date:  12/08/2023  ID:  Robert Clayton, DOB June 11, 1930, MRN [id number] PCP: Verdia Lombard, MD  [facility] Providers Cardiologist:  Stanly DELENA Leavens, MD     CC: oHCM  History of Present Illness: .    Robert Clayton is a 89 y.o. male with coronary artery disease and chronic diastolic heart failure who presents for reestablishment of cardiac care. Previously seen by Dr. Wonda.  Mr. Gagen is a 89 year old male with coronary artery disease, heart failure, and hypertrophic obstructive cardiomyopathy who presents for follow-up of his cardiac conditions. He is accompanied by his son. He was previously seen by Dr. Wonda.  He has a history of coronary artery disease and heart failure with reduced ejection fraction. In 2025, a repeat echocardiogram showed severe left ventricular outflow tract obstruction with a peak gradient of 58 mmHg. He is currently on metoprolol  tartrate 200 mg total daily dose, with a heart rate of 56 bpm. No chest pain or breathing issues are reported, and he feels 'pretty good'.  He has hypertension with labile blood pressure readings. His blood pressure is elevated today. He did not take his medications this morning. He is on telmisartan , amlodipine , hydrochlorothiazide , and metoprolol . His blood pressure has been sometimes very high and sometimes normal. He experiences some swelling in his legs, which improves with medication.  He has chronic kidney disease, stage 4, with a creatinine level of 3. His kidney function has been stable, fluctuating between stage 3B and 4. He is on a low potassium diet and is scheduled to meet with a nephrologist later this week.  He has hypertrophic obstructive cardiomyopathy with a functional bicuspid aortic valve and an aortic valve gradient of 10. No symptoms such as shortness of breath or dizziness are reported. He is active, still drives, and engages in activities like going to the store. He  has not been taking isosorbide  mononitrate recently and reports no symptoms of angina.  Discussed the use of AI scribe software for clinical note transcription with the patient, who gave verbal consent to proceed.   Relevant histories: .  Social- comes with wife: 5 children have all retired.   ROS: As per HPI.   Studies Reviewed: .   Cardiac Studies & Procedures   ______________________________________________________________________________________________   STRESS TESTS  MYOCARDIAL PERFUSION IMAGING 09/07/2015  Interpretation Summary  The left ventricular ejection fraction is normal (55-65%).  Nuclear stress EF: 56%.  There was no ST segment deviation noted during stress.  Defect 1: There is a medium defect of mild severity present in the basal inferior, mid inferior, mid inferolateral and apical inferior location.  Findings consistent with ischemia.  This is an intermediate risk study.  There is a medium size, mild severity reversible defect in the basal, mid and apical inferior and mid inferolateral walls consistent with ischemia (SDS =4).   ECHOCARDIOGRAM  ECHOCARDIOGRAM COMPLETE 06/10/2023  Narrative ECHOCARDIOGRAM REPORT    Patient Name:   Robert Clayton  Date of Exam: 06/10/2023 Medical Rec #:  [id number]     Height:       71.0 in Accession #:    [id number]    Weight:       173.0 lb Date of Birth:  1930-06-09    BSA:          1.983 m Patient Age:    89 years      BP:           136/75 mmHg  Patient Gender: M             HR:           77 bpm. Exam Location:  [address]  Procedure: 2D Echo, 3D Echo, Cardiac Doppler and Color Doppler (Both Spectral and Color Flow Doppler were utilized during procedure).  Indications:    I42.1 HOCM  History:        Patient has prior history of Echocardiogram examinations, most recent 09/14/2020. CHF and Hypertrophic Cardiomyopathy, CAD, PAD; Risk Factors:Hypertension and Dyslipidemia.  Sonographer:    Heather Hawks  RDCS Referring Phys: [facility] A Berania Peedin  IMPRESSIONS   1. Left ventricular ejection fraction, by estimation, is 65 to 70%. Left ventricular ejection fraction by 3D volume is 74 %. The left ventricle has normal function. The left ventricle has no regional wall motion abnormalities. There is severe asymmetric left ventricular hypertrophy of the basal-septal segment. Peak LV outflow tract gradient 58 mmHg with Valsalva. Narrow LV outflow tract but I did not see definite mitral valve systolic anterior motion. Left ventricular diastolic parameters are consistent with Grade I diastolic dysfunction (impaired relaxation). 2. Right ventricular systolic function is normal. The right ventricular size is normal. There is normal pulmonary artery systolic pressure. The estimated right ventricular systolic pressure is 13.5 mmHg. 3. Left atrial size was moderately dilated. 4. Right atrial size was mildly dilated. 5. The mitral valve is degenerative. No definite systolic anterior motion identified. Mild mitral valve regurgitation. No evidence of mitral stenosis. Moderate mitral annular calcification. 6. The aortic valve is bicuspid with fused left and right coronary cusps. There is moderate calcification of the aortic valve. Aortic valve regurgitation is trivial. Mild aortic valve stenosis. Aortic valve mean gradient measures 10.0 mmHg. 7. The inferior vena cava is normal in size with greater than 50% respiratory variability, suggesting right atrial pressure of 3 mmHg.  FINDINGS Left Ventricle: Left ventricular ejection fraction, by estimation, is 65 to 70%. Left ventricular ejection fraction by 3D volume is 74 %. The left ventricle has normal function. The left ventricle has no regional wall motion abnormalities. The left ventricular internal cavity size was normal in size. There is severe asymmetric left ventricular hypertrophy of the basal-septal segment. Left ventricular diastolic parameters are consistent  with Grade I diastolic dysfunction (impaired relaxation).  Right Ventricle: The right ventricular size is normal. No increase in right ventricular wall thickness. Right ventricular systolic function is normal. There is normal pulmonary artery systolic pressure. The tricuspid regurgitant velocity is 1.62 m/s, and with an assumed right atrial pressure of 3 mmHg, the estimated right ventricular systolic pressure is 13.5 mmHg.  Left Atrium: Left atrial size was moderately dilated.  Right Atrium: Right atrial size was mildly dilated.  Pericardium: There is no evidence of pericardial effusion.  Mitral Valve: The mitral valve is degenerative in appearance. There is moderate calcification of the mitral valve leaflet(s). Moderate mitral annular calcification. Mild mitral valve regurgitation. No evidence of mitral valve stenosis.  Tricuspid Valve: The tricuspid valve is normal in structure. Tricuspid valve regurgitation is trivial.  Aortic Valve: The aortic valve is bicuspid. There is moderate calcification of the aortic valve. Aortic valve regurgitation is trivial. Aortic regurgitation PHT measures 499 msec. Mild aortic stenosis is present. Aortic valve mean gradient measures 10.0 mmHg. Aortic valve peak gradient measures 15.4 mmHg. Aortic valve area, by VTI measures 3.20 cm.  Pulmonic Valve: The pulmonic valve was normal in structure. Pulmonic valve regurgitation is trivial.  Aorta: The aortic root is normal in  size and structure.  Venous: The inferior vena cava is normal in size with greater than 50% respiratory variability, suggesting right atrial pressure of 3 mmHg.  IAS/Shunts: No atrial level shunt detected by color flow Doppler.  Additional Comments: 3D was performed not requiring image post processing on an independent workstation and was normal.   LEFT VENTRICLE PLAX 2D LVIDd:         3.30 cm         Diastology LVIDs:         1.70 cm         LV e' medial:    4.28 cm/s LV PW:          1.30 cm         LV E/e' medial:  23.1 LV IVS:        2.10 cm         LV e' lateral:   6.74 cm/s LVOT diam:     2.30 cm         LV E/e' lateral: 14.7 LV SV:         130 LV SV Index:   66 LVOT Area:     4.15 cm        3D Volume EF LV 3D EF:    Left ventricul ar ejection fraction by 3D volume is 74 %.  3D Volume EF: 3D EF:        74 % LV EDV:       165 ml LV ESV:       44 ml LV SV:        122 ml  RIGHT VENTRICLE RV S prime:     18.00 cm/s RVSP:           13.5 mmHg  LEFT ATRIUM            Index        RIGHT ATRIUM           Index LA diam:      4.70 cm  2.37 cm/m   RA Pressure: 3.00 mmHg LA Vol (A2C): 104.2 ml 52.58 ml/m  RA Area:     19.40 cm LA Vol (A4C): 56.2 ml  28.35 ml/m  RA Volume:   57.40 ml  28.95 ml/m AORTIC VALVE AV Area (Vmax):    2.95 cm AV Area (Vmean):   2.89 cm AV Area (VTI):     3.20 cm AV Vmax:           196.00 cm/s AV Vmean:          133.000 cm/s AV VTI:            0.406 m AV Peak Grad:      15.4 mmHg AV Mean Grad:      10.0 mmHg LVOT Vmax:         139.33 cm/s LVOT Vmean:        92.667 cm/s LVOT VTI:          0.313 m LVOT/AV VTI ratio: 0.77 AI PHT:            499 msec  AORTA Ao Root diam: 3.30 cm Ao Asc diam:  3.70 cm  MITRAL VALVE                TRICUSPID VALVE MV Area (PHT): cm          TR Peak grad:   10.5 mmHg MV Decel Time: 299 msec     TR Vmax:  162.00 cm/s MR Peak grad: 152.0 mmHg    Estimated RAP:  3.00 mmHg MR Mean grad: 89.5 mmHg     RVSP:           13.5 mmHg MR Vmax:      616.50 cm/s MR Vmean:     441.0 cm/s    SHUNTS MV E velocity: 99.05 cm/s   Systemic VTI:  0.31 m MV A velocity: 118.50 cm/s  Systemic Diam: 2.30 cm MV E/A ratio:  0.84  Dalton McleanMD Electronically signed by Ezra Kanner Signature Date/Time: 06/10/2023/7:23:40 PM    Final          ______________________________________________________________________________________________      Physical Exam:    VS:  BP (!) 173/82   Pulse (!)  56   Ht 5' 10 (1.778 m)   Wt 169 lb (76.7 kg)   SpO2 98%   BMI 24.25 kg/m    Wt Readings from Last 3 Encounters:  12/08/23 169 lb (76.7 kg)  05/06/23 173 lb (78.5 kg)  01/15/23 178 lb 1.6 oz (80.8 kg)    Gen: No distress, elderly male   HEENT: No JVD; Prounced nose and ear lobules Cardiac: No rubs or Gallops, Systolic Murmur, regular bradycardia +1 radial pulses Respiratory: Clear to auscultation bilaterally, normal effort, normal  respiratory rate GI: Soft, nontender, non-distended  MS: Non pitting  edema;  moves all extremities Integument: Skin feels warm Neuro:  At time of evaluation, alert and oriented to person/place/time/situation  Psych: Normal affect, patient feels well   ASSESSMENT AND PLAN: .    Hypertrophic Cardiomyopathy - obstructive with severe gradient, 52 mm Hg, and bradycardia - suspicion of Fabry's/Danon/Noonan's or other mimics of HCM: moderate, cannot exclude BSH contributor - Gene variant: Deferred at this time - NYHA I  Coronary artery disease without current angina symptoms No current angina symptoms. Previously on isosorbide  mononitrate, but not taken in the last month. Medication does not improve mortality or reverse changes but alleviates symptoms. No symptoms warranting continuation of isosorbide  mononitrate. - Discontinued isosorbide  mononitrate. - Monitor for recurrence of angina symptoms.  Heart failure with reduced ejection fraction No specific discussion or changes in management for heart failure with reduced ejection fraction during this visit.  Chronic kidney disease stage 4 Creatinine level of 3.0. Kidney function stable, not at dialysis level. Monitoring kidney function is crucial due to potential medication adjustments. - Will recheck kidney function in two weeks after increasing hydrochlorothiazide . - Continue current management and monitor for any changes in kidney function.  Essential hypertension with labile blood pressure Labile  blood pressure with episodes of elevation. Blood pressure elevated today, possibly due to missed morning medications. Current medications include telmisartan , amlodipine , hydrochlorothiazide , and metoprolol . Plan to increase hydrochlorothiazide  to manage blood pressure and edema. - Increased hydrochlorothiazide  from 12.5 mg to 25 mg. - Rechecked kidney function in two weeks after medication adjustment. - Ensure adherence to antihypertensive medications.  Peripheral venous insufficiency with lower extremity edema Mild lower extremity edema, possibly related to venous insufficiency. Edema improves with medication adherence. - Increased hydrochlorothiazide  to manage edema. - Monitor for changes in edema with medication adjustment.  Family history, Discussed family screening  - son is at visit today; patient information materials given for screening; in 2026 we may discuss genetic screening largely for family screening  SCD  Assessment - age 72 AHA SCD not validated; he is low risk  Atrial fibrillation Assessment  - HCM-AF score NA (age) - with bradycardia, will send heart monitor for  AF eval  Medication symptom plan - Severe LVOT obstruction with a peak gradient of 58 mmHg. Bradycardia limits further evaluation. No symptoms of dyspnea, dizziness, or exercise intolerance. Treatment not initiated due to age and lack of symptoms. Risk of sudden cardiac death is low given age and current health status. Family screening options discussed, including genetic testing and echocardiograms every five years for siblings. - Monitor for symptoms of dyspnea, dizziness, or exercise intolerance. - Consider family screening for hypertrophic cardiomyopathy with echocardiograms every five years for siblings. - Discussed genetic testing for hypertrophic cardiomyopathy, but deferred due to insurance and age considerations.  Time:   I have spent a total of 40 minutes with the patient reviewing notes, imaging,  EKGs, labs, and examining the patient as well as establishing an assessment and plan that was discussed personally with the patient. Discussed disease state education, using shared decision making tools and cardiac modeling, and reviewed family screening with his son (no kids have been screened yet).   Stanly Leavens, MD FASE [facility] Cardiologist [facility]  [address] [place], [address] [place], [place] [address] [phone]  12:45 PM

## 2023-12-17 ENCOUNTER — Inpatient Hospital Stay: Payer: Medicare (Managed Care) | Attending: Hematology

## 2023-12-17 DIAGNOSIS — E538 Deficiency of other specified B group vitamins: Secondary | ICD-10-CM | POA: Diagnosis present

## 2023-12-17 MED ORDER — CYANOCOBALAMIN 1000 MCG/ML IJ SOLN
1000.0000 ug | INTRAMUSCULAR | Status: DC
  Administered 2023-12-17: 1000 ug via SUBCUTANEOUS
  Filled 2023-12-17: qty 1

## 2024-01-16 ENCOUNTER — Ambulatory Visit: Payer: Medicare (Managed Care) | Admitting: Hematology

## 2024-01-16 ENCOUNTER — Other Ambulatory Visit: Payer: Medicare (Managed Care)

## 2024-01-19 ENCOUNTER — Encounter: Payer: Self-pay | Admitting: Hematology

## 2024-01-20 ENCOUNTER — Other Ambulatory Visit: Payer: Self-pay

## 2024-01-20 DIAGNOSIS — E538 Deficiency of other specified B group vitamins: Secondary | ICD-10-CM

## 2024-01-20 DIAGNOSIS — D696 Thrombocytopenia, unspecified: Secondary | ICD-10-CM

## 2024-01-21 ENCOUNTER — Inpatient Hospital Stay: Payer: Medicare (Managed Care)

## 2024-01-21 ENCOUNTER — Encounter: Payer: Self-pay | Admitting: Hematology

## 2024-01-21 ENCOUNTER — Inpatient Hospital Stay: Payer: Medicare (Managed Care) | Attending: Hematology

## 2024-01-21 ENCOUNTER — Inpatient Hospital Stay: Payer: Medicare (Managed Care) | Admitting: Hematology

## 2024-01-21 VITALS — BP 160/67 | HR 54 | Temp 97.5°F | Resp 18 | Wt 156.2 lb

## 2024-01-21 DIAGNOSIS — D696 Thrombocytopenia, unspecified: Secondary | ICD-10-CM | POA: Diagnosis not present

## 2024-01-21 DIAGNOSIS — E538 Deficiency of other specified B group vitamins: Secondary | ICD-10-CM

## 2024-01-21 DIAGNOSIS — N189 Chronic kidney disease, unspecified: Secondary | ICD-10-CM | POA: Diagnosis not present

## 2024-01-21 DIAGNOSIS — E1122 Type 2 diabetes mellitus with diabetic chronic kidney disease: Secondary | ICD-10-CM | POA: Diagnosis not present

## 2024-01-21 DIAGNOSIS — I129 Hypertensive chronic kidney disease with stage 1 through stage 4 chronic kidney disease, or unspecified chronic kidney disease: Secondary | ICD-10-CM | POA: Diagnosis not present

## 2024-01-21 LAB — CMP (CANCER CENTER ONLY)
ALT: 17 U/L (ref 0–44)
AST: 25 U/L (ref 15–41)
Albumin: 4.1 g/dL (ref 3.5–5.0)
Alkaline Phosphatase: 65 U/L (ref 38–126)
Anion gap: 12 (ref 5–15)
BUN: 37 mg/dL — ABNORMAL HIGH (ref 8–23)
CO2: 24 mmol/L (ref 22–32)
Calcium: 9.4 mg/dL (ref 8.9–10.3)
Chloride: 109 mmol/L (ref 98–111)
Creatinine: 3.03 mg/dL — ABNORMAL HIGH (ref 0.61–1.24)
GFR, Estimated: 19 mL/min — ABNORMAL LOW
Glucose, Bld: 161 mg/dL — ABNORMAL HIGH (ref 70–99)
Potassium: 4.3 mmol/L (ref 3.5–5.1)
Sodium: 145 mmol/L (ref 135–145)
Total Bilirubin: 0.5 mg/dL (ref 0.0–1.2)
Total Protein: 7 g/dL (ref 6.5–8.1)

## 2024-01-21 LAB — CBC WITH DIFFERENTIAL (CANCER CENTER ONLY)
Abs Immature Granulocytes: 0.03 K/uL (ref 0.00–0.07)
Basophils Absolute: 0 K/uL (ref 0.0–0.1)
Basophils Relative: 1 %
Eosinophils Absolute: 0.1 K/uL (ref 0.0–0.5)
Eosinophils Relative: 1 %
HCT: 37.4 % — ABNORMAL LOW (ref 39.0–52.0)
Hemoglobin: 12.1 g/dL — ABNORMAL LOW (ref 13.0–17.0)
Immature Granulocytes: 0 %
Lymphocytes Relative: 26 %
Lymphs Abs: 1.8 K/uL (ref 0.7–4.0)
MCH: 29.6 pg (ref 26.0–34.0)
MCHC: 32.4 g/dL (ref 30.0–36.0)
MCV: 91.4 fL (ref 80.0–100.0)
Monocytes Absolute: 0.5 K/uL (ref 0.1–1.0)
Monocytes Relative: 7 %
Neutro Abs: 4.6 K/uL (ref 1.7–7.7)
Neutrophils Relative %: 65 %
Platelet Count: 61 K/uL — ABNORMAL LOW (ref 150–400)
RBC: 4.09 MIL/uL — ABNORMAL LOW (ref 4.22–5.81)
RDW: 14.8 % (ref 11.5–15.5)
WBC Count: 7 K/uL (ref 4.0–10.5)
nRBC: 0 % (ref 0.0–0.2)

## 2024-01-21 LAB — IRON AND IRON BINDING CAPACITY (CC-WL,HP ONLY)
Iron: 74 ug/dL (ref 45–182)
Saturation Ratios: 35 % (ref 17.9–39.5)
TIBC: 213 ug/dL — ABNORMAL LOW (ref 250–450)
UIBC: 139 ug/dL

## 2024-01-21 LAB — FERRITIN: Ferritin: 208 ng/mL (ref 24–336)

## 2024-01-21 LAB — VITAMIN B12: Vitamin B-12: 1644 pg/mL — ABNORMAL HIGH (ref 180–914)

## 2024-01-21 LAB — IMMATURE PLATELET FRACTION: Immature Platelet Fraction: 2.3 % (ref 1.2–8.6)

## 2024-01-21 MED ORDER — CYANOCOBALAMIN 1000 MCG/ML IJ SOLN
1000.0000 ug | INTRAMUSCULAR | Status: DC
  Administered 2024-01-21: 1000 ug via SUBCUTANEOUS
  Filled 2024-01-21: qty 1
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
## 2024-01-22 ENCOUNTER — Encounter: Payer: Self-pay | Admitting: Hematology

## 2024-01-28 ENCOUNTER — Encounter: Payer: Self-pay | Admitting: Hematology

## 2024-01-28 ENCOUNTER — Ambulatory Visit: Admitting: Podiatry

## 2024-01-28 DIAGNOSIS — M79674 Pain in right toe(s): Secondary | ICD-10-CM

## 2024-01-28 DIAGNOSIS — B351 Tinea unguium: Secondary | ICD-10-CM

## 2024-01-28 DIAGNOSIS — L600 Ingrowing nail: Secondary | ICD-10-CM | POA: Diagnosis not present

## 2024-01-28 DIAGNOSIS — M79675 Pain in left toe(s): Secondary | ICD-10-CM | POA: Diagnosis not present

## 2024-01-28 NOTE — Progress Notes (Signed)
 Patient presents for evaluation and treatment of tenderness and some redness around nails feet.  Tenderness around toes with walking and wearing shoes.  Says the big nail particularly gets thick and painful.  Says he gets some tingling in the feet at times.  Physical exam:  General appearance: Alert, pleasant, and in no acute distress.  Vascular: Pedal pulses: DP 14 B/L, PT 0/4 B/L.  Moderate edema lower legs bilaterally.  Capillary refill time immediate bilaterally  Neurologic: Light touch intact feet bilaterally.  Normal Achilles tendon reflex bilaterally.  Slight diminishment vibratory sensation to feet bilaterally.  Monofilament sensation toes 1 through 5 bilaterally intact  Dermatologic:  Nails thickened, disfigured, discolored 1-5 BL with subungual debris.  Redness and hypertrophic nail folds along nail folds bilaterally but no signs of drainage or infection.  Musculoskeletal:  Normal strength lower extremity bilaterally 1.  Hammertoe deformities 2 through 5 bilaterally   Diagnosis: 1. Painful onychomycotic nails 1 through 5 bilaterally. 2. Pain toes 1 through 5 bilaterally. 3.  Ingrown nail hallux bilaterally  Plan: -New patient office visit for evaluation and management level 3.  Modifier 25 -Discussed with him the hallux nails that are bothering him reticular.  Recommended periodic debridement of these his pulses are significantly diminished so would not recommend any surgical intervention for removal of the nails.  Will just keep the nails with onychomycosis debrided on a routine basis. -Debrided onychomycotic nails 1 through 5 bilaterally.  Sharply debrided nails with nail clipper and reduced with a power bur.  Return 3 months [facility]
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
## 2024-02-23 ENCOUNTER — Inpatient Hospital Stay: Payer: Medicare (Managed Care)

## 2024-02-25 ENCOUNTER — Inpatient Hospital Stay: Attending: Hematology

## 2024-03-22 ENCOUNTER — Inpatient Hospital Stay: Payer: Medicare (Managed Care)

## 2024-03-24 ENCOUNTER — Inpatient Hospital Stay: Attending: Hematology

## 2024-04-19 ENCOUNTER — Inpatient Hospital Stay: Payer: Medicare (Managed Care)

## 2024-04-21 ENCOUNTER — Inpatient Hospital Stay: Attending: Hematology

## 2024-04-30 ENCOUNTER — Ambulatory Visit: Payer: Medicare (Managed Care) | Admitting: Podiatry

## 2024-05-17 ENCOUNTER — Inpatient Hospital Stay: Payer: Medicare (Managed Care)

## 2024-05-19 ENCOUNTER — Inpatient Hospital Stay: Attending: Hematology

## 2024-06-14 ENCOUNTER — Inpatient Hospital Stay: Payer: Medicare (Managed Care)

## 2024-06-16 ENCOUNTER — Inpatient Hospital Stay: Attending: Hematology

## 2024-07-12 ENCOUNTER — Inpatient Hospital Stay: Payer: Medicare (Managed Care)

## 2024-07-14 ENCOUNTER — Inpatient Hospital Stay: Attending: Hematology

## 2024-08-16 ENCOUNTER — Inpatient Hospital Stay: Payer: Medicare (Managed Care)

## 2024-08-18 ENCOUNTER — Inpatient Hospital Stay: Attending: Hematology

## 2024-09-14 ENCOUNTER — Inpatient Hospital Stay: Payer: Medicare (Managed Care) | Admitting: Hematology

## 2024-09-14 ENCOUNTER — Inpatient Hospital Stay: Payer: Medicare (Managed Care)

## 2024-09-15 ENCOUNTER — Inpatient Hospital Stay

## 2024-09-15 ENCOUNTER — Inpatient Hospital Stay: Attending: Hematology

## 2024-09-15 ENCOUNTER — Inpatient Hospital Stay: Admitting: Hematology

## 2024-10-12 ENCOUNTER — Inpatient Hospital Stay: Payer: Medicare (Managed Care)

## 2024-10-13 ENCOUNTER — Inpatient Hospital Stay: Attending: Hematology

## 2024-11-09 ENCOUNTER — Inpatient Hospital Stay: Payer: Medicare (Managed Care)

## 2024-11-10 ENCOUNTER — Inpatient Hospital Stay: Attending: Hematology

## 2024-12-07 ENCOUNTER — Inpatient Hospital Stay: Payer: Medicare (Managed Care)

## 2024-12-08 ENCOUNTER — Inpatient Hospital Stay: Attending: Hematology
